# Patient Record
Sex: Female | Born: 1935 | Race: White | Hispanic: No | Smoking: Never smoker
Health system: Southern US, Community
[De-identification: ages and names within clinical notes are randomized; demographics above are authoritative.]

## PROBLEM LIST (undated history)

## (undated) DIAGNOSIS — M1990 Unspecified osteoarthritis, unspecified site: Secondary | ICD-10-CM

## (undated) DIAGNOSIS — I739 Peripheral vascular disease, unspecified: Secondary | ICD-10-CM

## (undated) DIAGNOSIS — M810 Age-related osteoporosis without current pathological fracture: Secondary | ICD-10-CM

## (undated) DIAGNOSIS — C801 Malignant (primary) neoplasm, unspecified: Secondary | ICD-10-CM

## (undated) DIAGNOSIS — I10 Essential (primary) hypertension: Secondary | ICD-10-CM

## (undated) HISTORY — PX: ABDOMINAL HYSTERECTOMY: SHX81

## (undated) HISTORY — PX: SPINE SURGERY: SHX786

## (undated) HISTORY — DX: Essential (primary) hypertension: I10

## (undated) HISTORY — DX: Age-related osteoporosis without current pathological fracture: M81.0

---

## 1982-05-12 HISTORY — PX: KNEE SURGERY: SHX244

## 2001-11-15 ENCOUNTER — Encounter: Admission: RE | Admit: 2001-11-15 | Discharge: 2001-11-15 | Payer: Self-pay | Admitting: Family Medicine

## 2001-11-15 ENCOUNTER — Encounter: Payer: Self-pay | Admitting: Family Medicine

## 2011-08-23 ENCOUNTER — Emergency Department (INDEPENDENT_AMBULATORY_CARE_PROVIDER_SITE_OTHER)
Admission: EM | Admit: 2011-08-23 | Discharge: 2011-08-23 | Disposition: A | Discharge status: Home / Self Care | Payer: Medicare Other | Source: Home / Self Care | Attending: Emergency Medicine | Admitting: Emergency Medicine

## 2011-08-23 ENCOUNTER — Encounter (HOSPITAL_COMMUNITY): Payer: Self-pay

## 2011-08-23 DIAGNOSIS — H1130 Conjunctival hemorrhage, unspecified eye: Secondary | ICD-10-CM | POA: Diagnosis not present

## 2011-08-23 NOTE — ED Notes (Signed)
Pt was cutting bushes today at 1pm and a branch of the bush hit her in her left eye.  Her eye is red and painful.

## 2011-08-23 NOTE — Discharge Instructions (Signed)
May use Systane, cool compresses or tea bag poultice if needed.  Subconjunctival Hemorrhage A subconjunctival hemorrhage is a bright red patch covering a portion of the white of the eye. The white part of the eye is called the sclera, and it is covered by a thin membrane called the conjunctiva. This membrane is clear, except for tiny blood vessels that you can see with the naked eye. When your eye is irritated or inflamed and becomes red, it is because the vessels in the conjunctiva are swollen. Sometimes, a blood vessel in the conjunctiva can break and bleed. When this occurs, the blood builds up between the conjunctiva and the sclera, and spreads out to create a red area. The red spot may be very small at first. It may then spread to cover a larger part of the surface of the eye, or even all of the visible white part of the eye. In almost all cases, the blood will go away and the eye will become white again. Before completely dissolving, however, the red area may spread. It may also become brownish-yellow in color, before going away. If a lot of blood collects under the conjunctiva, it may look like a bulge on the surface of the eye. This looks scary, but it will also eventually flatten out and go away. Subconjunctival hemorrhages do not cause pain, but if swollen, may cause a feeling of irritation. There is no effect on vision.  CAUSES   The most common cause is mild trauma (rubbing the eye, irritation).   Subconjunctival hemorrhages can happen because of coughing or straining (lifting heavy objects), vomiting, or sneezing.   In some cases, your doctor may want to check your blood pressure. High blood pressure can also cause a sunconjunctival hemorrhage.   Severe trauma or blunt injuries.   Diseases that affect blood clotting (hemophilia, leukemia).   Abnormalities of blood vessels behind the eye (carotid cavernous sinus fistula).   Tumors behind the eye.   Certain drugs (aspirin, coumadin,  heparin).   Recent eye surgery.  HOME CARE INSTRUCTIONS   Do not worry about the appearance of your eye. You may continue your usual activities.   Often, follow-up is not necessary.  SEEK MEDICAL CARE IF:   Your eye becomes painful.   The bleeding does not disappear within 3 weeks.   Bleeding occurs elsewhere, for example, under the skin, in the mouth, or in the other eye.   You have recurring subconjunctival hemorrhages.  SEEK IMMEDIATE MEDICAL CARE IF:   Your vision changes or you have difficulty seeing.   You develop severe headache, persistent vomiting, confusion, or abnormal drowsiness (lethargy).   Your eye seems to bulge or protrude from the eye socket.   You notice the sudden appearance of bruises, or have spontaneous bleeding elsewhere on your body.  Document Released: 04/28/2005 Document Revised: 04/17/2011 Document Reviewed: 03/26/2009 [facility] Patient Information 2012 [place], [place].

## 2011-08-23 NOTE — ED Provider Notes (Signed)
Chief Complaint  Patient presents with  . Eye Injury    History of Present Illness:   [name] is a 78 year old female who was trimming some hedges today when a stick flew up and struck her in the left eye. This caused some hemorrhaging in the conjunctiva. She denies any pain in the eye or difficulty with her vision. No diplopia. She denies any injury to the remainder of the face or eyelids.  Review of Systems:  Other than noted above, the patient denies any of the following symptoms: Systemic:  No fever, chills, sweats, fatigue, or weight loss. Eye:  No redness, eye pain, photophobia, discharge, blurred vision, or diplopia. ENT:  No nasal congestion, rhinorrhea, or sore throat. Lymphatic:  No adenopathy. Skin:  No rash or pruritis.  PMFSH:  Past medical history, family history, social history, meds, and allergies were reviewed.  Physical Exam:   Vital signs:  BP 172/90  Pulse 72  Temp(Src) 98.3 F (36.8 C) (Oral)  Resp 18  SpO2 98% General:  Alert and in no distress. Eye:  The eyelids and periorbital tissues are normal. There was no pain to palpation around the orbital rim. She has a some conjunctival hemorrhage on the lateral aspect of the left eye. The cornea is intact to fluorescein staining, anterior chamber is normal with no hyphema, funduscopic exam is normal. She has a full range of EOMs without diplopia. The right eye was normal. ENT:  TMs and canals clear.  Nasal mucosa normal.  No intra-oral lesions, mucous membranes moist, pharynx clear. Neck:  No adenopathy tenderness or mass. Skin:  Clear, warm and dry.  Assessment:   Diagnoses that have been ruled out:  None  Diagnoses that are still under consideration:  None  Final diagnoses:  Subconjunctival hematoma    Plan:   1.  The following meds were prescribed:   New Prescriptions   No medications on file   2.  The patient was instructed in symptomatic care and handouts were given. 3.  The patient was told to  return if becoming worse in any way, if no better in 3 or 4 days, and given some red flag symptoms that would indicate earlier return. I suggested cool compresses, Systane eyedrops, and avoidance of rubbing the eye. She will return if there any further problems.     Reuben Likes, MD 08/23/11 [phone]

## 2012-03-03 DIAGNOSIS — Z23 Encounter for immunization: Secondary | ICD-10-CM | POA: Diagnosis not present

## 2013-01-21 DIAGNOSIS — Z23 Encounter for immunization: Secondary | ICD-10-CM | POA: Diagnosis not present

## 2014-01-23 DIAGNOSIS — Z23 Encounter for immunization: Secondary | ICD-10-CM | POA: Diagnosis not present

## 2014-08-22 ENCOUNTER — Ambulatory Visit (INDEPENDENT_AMBULATORY_CARE_PROVIDER_SITE_OTHER): Payer: Medicare Other | Admitting: Family Medicine

## 2014-08-22 VITALS — BP 182/98 | HR 62 | Temp 97.9°F | Resp 18 | Ht 64.25 in | Wt 137.4 lb

## 2014-08-22 DIAGNOSIS — I499 Cardiac arrhythmia, unspecified: Secondary | ICD-10-CM

## 2014-08-22 DIAGNOSIS — I10 Essential (primary) hypertension: Secondary | ICD-10-CM

## 2014-08-22 DIAGNOSIS — M79672 Pain in left foot: Secondary | ICD-10-CM

## 2014-08-22 DIAGNOSIS — B351 Tinea unguium: Secondary | ICD-10-CM

## 2014-08-22 DIAGNOSIS — M79671 Pain in right foot: Secondary | ICD-10-CM

## 2014-08-22 LAB — POCT CBC
Granulocyte percent: 45 %G (ref 37–80)
HCT, POC: 43.6 % (ref 37.7–47.9)
Hemoglobin: 13.8 g/dL (ref 12.2–16.2)
Lymph, poc: 2.2 (ref 0.6–3.4)
MCH, POC: 28.8 pg (ref 27–31.2)
MCHC: 31.8 g/dL (ref 31.8–35.4)
MCV: 90.7 fL (ref 80–97)
MID (cbc): 0.4 (ref 0–0.9)
MPV: 8.1 fL (ref 0–99.8)
PLATELET COUNT, POC: 204 10*3/uL (ref 142–424)
POC Granulocyte: 2.1 (ref 2–6.9)
POC LYMPH PERCENT: 46.8 %L (ref 10–50)
POC MID %: 8.2 %M (ref 0–12)
RBC: 4.8 M/uL (ref 4.04–5.48)
RDW, POC: 15 %
WBC: 4.6 10*3/uL (ref 4.6–10.2)

## 2014-08-22 MED ORDER — PREDNISONE 20 MG PO TABS: ORAL_TABLET | ORAL | Status: DC

## 2014-08-22 MED ORDER — CEPHALEXIN 500 MG PO CAPS: 500.0000 mg | ORAL_CAPSULE | Freq: Three times a day (TID) | ORAL | Status: DC

## 2014-08-22 MED ORDER — AMLODIPINE BESYLATE 2.5 MG PO TABS: 2.5000 mg | ORAL_TABLET | Freq: Every day | ORAL | Status: DC

## 2014-08-22 NOTE — Progress Notes (Addendum)
[facility] [address], [place] [address] [phone]  Date:  08/22/2014   Name:  [name]   DOB:  08/01/1931   MRN:  [id number]  PCP:  No PCP Per Patient    Chief Complaint: L Foot / toe nail infection x 2 weeks   History of Present Illness:  [name] is a 81 y.o. very pleasant female patient who presents with the following:  She has noted some pain and possible infection around the left great toenail for a couple of weeks. This seems to be getting worse.  No known injury.she has noted that the toenail is thick and seems to have a fungal infection (also present on the right great toe).  She notes that she developed some redness of the right great toe and started doing hot epson salt water soaks about 2 weeks ago. The toes are still red, but she does not have any pain, burning or numbness.   She is generally in good health, she is not on any medication NKDA States she is not aware of any history of HTN, but admits that she does not really see doctors.  She does check her BP at home but does not know what her numbers look like at home.  However she does not think that her BP is generally high   She has no CP, no SOB, no palpitations and no history of syncope  There are no active problems to display for this patient.   History reviewed. No pertinent past medical history.  Past Surgical History  Procedure Laterality Date  . Spine surgery    . Knee surgery  1984    History  Substance Use Topics  . Smoking status: Never Smoker   . Smokeless tobacco: Not on file  . Alcohol Use: No    Family History  Problem Relation Age of Onset  . Diabetes Mother   . Heart disease Mother   . Dementia Sister   . Diabetes Son     No Known Allergies  Medication list has been reviewed and updated.  No current outpatient prescriptions on file prior to visit.   No current facility-administered medications on file prior to visit.    Review  of Systems:  As per HPI- otherwise negative.   Physical Examination: Filed Vitals:   08/22/14 1140  BP: 178/80  Pulse: 62  Temp: 97.9 F (36.6 C)  Resp: 18   Filed Vitals:   08/22/14 1140  Height: 5' 4.25" (1.632 m)  Weight: 137 lb 6.4 oz (62.324 kg)   Body mass index is 23.4 kg/(m^2). Ideal Body Weight: Weight in (lb) to have BMI = 25: 146.5  GEN: WDWN, NAD, Non-toxic, A & O x 3, looks well HEENT: Atraumatic, Normocephalic. Neck supple. No masses, No LAD. Ears and Nose: No external deformity. CV: mild irregularity ?pvcs, No M/G/R. No JVD. No thrill. No extra heart sounds. PULM: CTA B, no wheezes, crackles, rhonchi. No retractions. No resp. distress. No accessory muscle use. ABD: S, NT, ND, +BS. No rebound. No HSM. EXTR: No c/c/e NEURO Normal gait.  PSYCH: Normally interactive. Conversant. Not depressed or anxious appearing.  Calm demeanor.  left foot:  She has redness of all the toes but no heat tenderness. Normal cap refill.  The great toenail is thickened c/w onychomycosis but is not apparently ingrown. No sign of paronychia.  The skin on the dorsum of the foot is somewhat dry and scaly, and some of  the skin on her toes is dry and peeling  Results for orders placed or performed in visit on 08/22/14  POCT CBC  Result Value Ref Range   WBC 4.6 4.6 - 10.2 K/uL   Lymph, poc 2.2 0.6 - 3.4   POC LYMPH PERCENT 46.8 10 - 50 %L   MID (cbc) 0.4 0 - 0.9   POC MID % 8.2 0 - 12 %M   POC Granulocyte 2.1 2 - 6.9   Granulocyte percent 45.0 37 - 80 %G   RBC 4.80 4.04 - 5.48 M/uL   Hemoglobin 13.8 12.2 - 16.2 g/dL   HCT, POC 43.6 37.7 - 47.9 %   MCV 90.7 80 - 97 fL   MCH, POC 28.8 27 - 31.2 pg   MCHC 31.8 31.8 - 35.4 g/dL   RDW, POC 15.0 %   Platelet Count, POC 204 142 - 424 K/uL   MPV 8.1 0 - 99.8 fL    EKG: not a fib, likely SR wiht PACs vs Premature junctional complexes according to Dr. Marlou Porch who is DOD at [facility] cardiology  Assessment and Plan: Left foot pain - Plan:  cephALEXin (KEFLEX) 500 MG capsule, predniSONE (DELTASONE) 20 MG tablet  Onychomycosis - Plan: POCT CBC, Comprehensive metabolic panel  Essential hypertension - Plan: amLODipine (NORVASC) 2.5 MG tablet  Irregular cardiac rhythm - Plan: EKG 12-Lead   She is here today with a somewhat unusual foot problem.  She noted redness of all the toes of the left foot but no pain or discomfort.  I wonder if some of this redness may be due to recent use of frequent soaks.  Will treat her with a short course of steroids and also keflex and plan for close follow-up if not getting better HTN: she is agreeable to starting BP medication today.  Will start on a low dose of norvasc Discussed her EKG findings.  These are of uncertain significance.  Suggested that we refer her to cardiology for evaluation and to establish care in case of future concerns.  She declines for now but will keep this in mind   Meds ordered this encounter  Medications  . cephALEXin (KEFLEX) 500 MG capsule    Sig: Take 1 capsule (500 mg total) by mouth 3 (three) times daily.    Dispense:  21 capsule    Refill:  0  . predniSONE (DELTASONE) 20 MG tablet    Sig: Take 1 pill daily for 5 days    Dispense:  5 tablet    Refill:  0  . amLODipine (NORVASC) 2.5 MG tablet    Sig: Take 1 tablet (2.5 mg total) by mouth daily. Increase to 2 pills if instructed    Dispense:  60 tablet    Refill:  0      Signed Lamar Blinks, MD  Please call her DIL Cera Rorke [phone] with any information

## 2014-08-22 NOTE — Patient Instructions (Addendum)
I will be in touch with your labs asap For the time being stop soaking your foot, and use a gentle moisturizing cream as needed Use the keflex (antibitoic) and prednisone (steroid) as directed Assuming that your foot gets better and that your labs look ok we can start oral terbinafine for your toenail fungus.   Alternatively we can remove the nail if you continue to have discomfort after your redness is resolved   Let's start you on amlodipine 2.5 mg once a day for your blood pressure.  We may need to go up to 5 mg depending on your blood pressure

## 2014-08-23 LAB — COMPREHENSIVE METABOLIC PANEL
ALT: 12 U/L (ref 0–35)
AST: 16 U/L (ref 0–37)
Albumin: 4 g/dL (ref 3.5–5.2)
Alkaline Phosphatase: 72 U/L (ref 39–117)
BUN: 10 mg/dL (ref 6–23)
CALCIUM: 9.1 mg/dL (ref 8.4–10.5)
CO2: 24 mEq/L (ref 19–32)
CREATININE: 0.52 mg/dL (ref 0.50–1.10)
Chloride: 106 mEq/L (ref 96–112)
Glucose, Bld: 92 mg/dL (ref 70–99)
Potassium: 4.6 mEq/L (ref 3.5–5.3)
Sodium: 139 mEq/L (ref 135–145)
Total Bilirubin: 0.4 mg/dL (ref 0.2–1.2)
Total Protein: 6.4 g/dL (ref 6.0–8.3)

## 2014-08-27 ENCOUNTER — Telehealth: Payer: Self-pay | Admitting: *Deleted

## 2014-08-27 DIAGNOSIS — B351 Tinea unguium: Secondary | ICD-10-CM

## 2014-08-27 NOTE — Telephone Encounter (Signed)
Pt's daughter Elasia Furnish ) called and left message on lab voicemail in regards to pt's lab results. She stated someone called and spoke to her mother and due to her age she didn't understand. I tried to look into the chart to see who may have called or if a letter was sent, but was unable to find anything . It didn't even look like the labs were reviewed yet. Wasn't sure if maybe you tried calling her or not . Please advise and ill be happy to give them a call back .

## 2014-08-28 ENCOUNTER — Encounter: Payer: Self-pay | Admitting: Family Medicine

## 2014-08-28 MED ORDER — TERBINAFINE HCL 250 MG PO TABS: 250.0000 mg | ORAL_TABLET | Freq: Every day | ORAL | Status: DC

## 2014-08-28 NOTE — Telephone Encounter (Signed)
Called Debra back last night 4/17.  Her MIL's labs look good.  They report that her feet are looking a lot better.  She can use lamisil for her toenails if she likes, but I do not strongly recommend it as this treatment can have SE and the benefits are limited.  I will call in an rx for lamisil and they will talk about it at home.  Hilda Blades also reports that Valisha has had BP readings of approx 140/70s on the norvasc- advised that this is fine, she does not need to be any lower  Meds ordered this encounter  Medications  . terbinafine (LAMISIL) 250 MG tablet    Sig: Take 1 tablet (250 mg total) by mouth daily. Use for 12 weeks for toenail fungus    Dispense:  30 tablet    Refill:  2     Results for orders placed or performed in visit on 08/22/14  Comprehensive metabolic panel  Result Value Ref Range   Sodium 139 135 - 145 mEq/L   Potassium 4.6 3.5 - 5.3 mEq/L   Chloride 106 96 - 112 mEq/L   CO2 24 19 - 32 mEq/L   Glucose, Bld 92 70 - 99 mg/dL   BUN 10 6 - 23 mg/dL   Creat 0.52 0.50 - 1.10 mg/dL   Total Bilirubin 0.4 0.2 - 1.2 mg/dL   Alkaline Phosphatase 72 39 - 117 U/L   AST 16 0 - 37 U/L   ALT 12 0 - 35 U/L   Total Protein 6.4 6.0 - 8.3 g/dL   Albumin 4.0 3.5 - 5.2 g/dL   Calcium 9.1 8.4 - 10.5 mg/dL  POCT CBC  Result Value Ref Range   WBC 4.6 4.6 - 10.2 K/uL   Lymph, poc 2.2 0.6 - 3.4   POC LYMPH PERCENT 46.8 10 - 50 %L   MID (cbc) 0.4 0 - 0.9   POC MID % 8.2 0 - 12 %M   POC Granulocyte 2.1 2 - 6.9   Granulocyte percent 45.0 37 - 80 %G   RBC 4.80 4.04 - 5.48 M/uL   Hemoglobin 13.8 12.2 - 16.2 g/dL   HCT, POC 43.6 37.7 - 47.9 %   MCV 90.7 80 - 97 fL   MCH, POC 28.8 27 - 31.2 pg   MCHC 31.8 31.8 - 35.4 g/dL   RDW, POC 15.0 %   Platelet Count, POC 204 142 - 424 K/uL   MPV 8.1 0 - 99.8 fL

## 2014-09-08 ENCOUNTER — Telehealth: Payer: Self-pay | Admitting: Family Medicine

## 2014-09-08 NOTE — Telephone Encounter (Signed)
Called but no answer- LMOM.  Will try back

## 2014-09-08 NOTE — Telephone Encounter (Signed)
Patient's daughter in law states that patient's big toe is still in pain. Please help.  Lorissa Kishbaugh (DIL) [phone]

## 2014-09-08 NOTE — Telephone Encounter (Signed)
Patient's daughter Jackelyn Poling is returning a missed phone call for Dr. Lorelei Pont. Please call!

## 2014-09-08 NOTE — Telephone Encounter (Signed)
fyi dr Lorelei Pont

## 2014-09-09 NOTE — Telephone Encounter (Signed)
Called again and [facility].  I will try again, but if she is still having foot pain it may be helpful to bring her back into clinic.  The cause for her pain was not clear at her last visit so a re-eval may be needed

## 2014-09-10 NOTE — Telephone Encounter (Signed)
Called [name] back- they plan to bring her in tomorrow for a recheck.  This is a good plan and I will look for her then

## 2014-09-11 ENCOUNTER — Ambulatory Visit (INDEPENDENT_AMBULATORY_CARE_PROVIDER_SITE_OTHER): Payer: Medicare Other | Admitting: Family Medicine

## 2014-09-11 VITALS — BP 126/76 | HR 82 | Temp 98.2°F | Resp 16 | Ht 64.5 in | Wt 140.0 lb

## 2014-09-11 DIAGNOSIS — I10 Essential (primary) hypertension: Secondary | ICD-10-CM | POA: Diagnosis not present

## 2014-09-11 DIAGNOSIS — M79662 Pain in left lower leg: Secondary | ICD-10-CM | POA: Diagnosis not present

## 2014-09-11 MED ORDER — AMLODIPINE BESYLATE 2.5 MG PO TABS: 2.5000 mg | ORAL_TABLET | Freq: Every day | ORAL | Status: DC

## 2014-09-11 NOTE — Progress Notes (Signed)
[facility] [address] [place] [address] [phone]  Date:  09/11/2014   Name:  [name]   DOB:  09-Oct-1931   MRN:  [id number]  PCP:  No PCP Per Patient    Chief Complaint: Follow-up   History of Present Illness:  [name] is a 81 y.o. very pleasant female patient who presents with the following:  Here to follow-up foot pain.  I saw her on 4/12 with pain in her left foot without any apparent cause.  We tried a short course of prednisone and keflex.  She did get better for a time but then her pain and redness came back.  She notes the sx just in her left foot.  She has stopped soaking her foot.  She has a couple of skin cracks now over the medial 1st MCP and over the medial great toe.    She is a never smoker and is not diabetic.   No prior history of circulation problems She is taking just 2.5 mg of amlodipine and her BP is doing well  Patient Active Problem List   Diagnosis Date Noted  . Essential hypertension 08/22/2014    History reviewed. No pertinent past medical history.  Past Surgical History  Procedure Laterality Date  . Spine surgery    . Knee surgery  1984    History  Substance Use Topics  . Smoking status: Never Smoker   . Smokeless tobacco: Not on file  . Alcohol Use: No    Family History  Problem Relation Age of Onset  . Diabetes Mother   . Heart disease Mother   . Dementia Sister   . Diabetes Son     No Known Allergies  Medication list has been reviewed and updated.  Current Outpatient Prescriptions on File Prior to Visit  Medication Sig Dispense Refill  . amLODipine (NORVASC) 2.5 MG tablet Take 1 tablet (2.5 mg total) by mouth daily. Increase to 2 pills if instructed 60 tablet 0  . cephALEXin (KEFLEX) 500 MG capsule Take 1 capsule (500 mg total) by mouth 3 (three) times daily. 21 capsule 0  . terbinafine (LAMISIL) 250 MG tablet Take 1 tablet (250 mg total) by mouth daily. Use for 12 weeks for  toenail fungus 30 tablet 2  . predniSONE (DELTASONE) 20 MG tablet Take 1 pill daily for 5 days (Patient not taking: Reported on 09/11/2014) 5 tablet 0   No current facility-administered medications on file prior to visit.    Review of Systems:  As per HPI- otherwise negative.   Physical Examination: Filed Vitals:   09/11/14 1400  BP: 126/76  Pulse: 82  Temp: 98.2 F (36.8 C)  Resp: 16   Filed Vitals:   09/11/14 1400  Height: 5' 4.5" (1.638 m)  Weight: 140 lb (63.504 kg)   Body mass index is 23.67 kg/(m^2). Ideal Body Weight: Weight in (lb) to have BMI = 25: 147.6  GEN: WDWN, NAD, Non-toxic, A & O x 3, well appearing older lady, here today with her DIL Debra HEENT: Atraumatic, Normocephalic. Neck supple. No masses, No LAD. Ears and Nose: No external deformity. CV: RRR, No M/G/R. No JVD. No thrill. No extra heart sounds. PULM: CTA B, no wheezes, crackles, rhonchi. No retractions. No resp. distress. No accessory muscle use.Marland Kitchen EXTR: No c/c/e NEURO Normal gait.  PSYCH: Normally interactive. Conversant. Not depressed or anxious appearing.  Calm demeanor.   Right foot is normal, normal color and perfusion of  toes and no redness.   The left toes are still ruddy in appearance and now with scattered tiny petechiae over the dorsal toes.  She has reduced but present dorsalis pedis pulse, normal cap refill of the toes although her toes are slightly cool in comparison with the right. She has 2 non- infected appearing skin cracks on the medial left great toe/ 1st MCP.  These were dressed with non- stick pads and cobain dressing  Assessment and Plan: Pain of left lower leg - Plan: Ambulatory referral to Vascular Surgery  Essential hypertension  BP looks great on her 2.5 mg of amlodipine.  Unusual foot problem.  Suspect her issue may be circulatory.  Was able to make appt with vascular surgery this week- appreciate their consultation.  Signed Lamar Blinks, MD

## 2014-09-11 NOTE — Patient Instructions (Addendum)
You have an appt with vascular surgery this Wednesday 09/13/14 at 2:30 pm. Arrive around 2:00  [facility] [place] ?   Address: [address] [place], [place] [address]  [phone]   Your blood pressure looks much better!

## 2014-09-12 ENCOUNTER — Other Ambulatory Visit: Payer: Self-pay | Admitting: *Deleted

## 2014-09-12 ENCOUNTER — Encounter: Payer: Self-pay | Admitting: Vascular Surgery

## 2014-09-12 DIAGNOSIS — R0989 Other specified symptoms and signs involving the circulatory and respiratory systems: Secondary | ICD-10-CM

## 2014-09-13 ENCOUNTER — Ambulatory Visit (INDEPENDENT_AMBULATORY_CARE_PROVIDER_SITE_OTHER): Payer: Medicare Other | Admitting: Vascular Surgery

## 2014-09-13 ENCOUNTER — Encounter: Payer: Self-pay | Admitting: Vascular Surgery

## 2014-09-13 ENCOUNTER — Ambulatory Visit (HOSPITAL_COMMUNITY)
Admission: RE | Admit: 2014-09-13 | Discharge: 2014-09-13 | Disposition: A | Discharge status: Home / Self Care | Payer: Medicare Other | Source: Ambulatory Visit | Attending: Vascular Surgery | Admitting: Vascular Surgery

## 2014-09-13 VITALS — BP 145/74 | HR 68 | Resp 16 | Ht 64.0 in | Wt 139.0 lb

## 2014-09-13 DIAGNOSIS — I739 Peripheral vascular disease, unspecified: Secondary | ICD-10-CM | POA: Diagnosis not present

## 2014-09-13 DIAGNOSIS — R0989 Other specified symptoms and signs involving the circulatory and respiratory systems: Secondary | ICD-10-CM | POA: Diagnosis not present

## 2014-09-13 NOTE — Progress Notes (Signed)
[facility] [place] [facility] HISTORY AND PHYSICAL   History of Present Illness:  Patient is a 81 y.o. year old female who presents for evaluation of painful left first toe. The patient has a one-month history of what was thought to be an ingrown toenail on the left first toe. This has failed to heal. She was on Keflex for 2 weeks. This did not improve. She states the swelling has improved somewhat but she now has a crack in the skin. She also states the tip of the toe is numb. There is some pain a few put pressure on the toe. She has had no drainage from the toe. She denies history of diabetes. She does have a history of hypertension. She states her cholesterol has been okay.   Past Medical History  Diagnosis Date  . Hypertension     Past Surgical History  Procedure Laterality Date  . Spine surgery    . Knee surgery  1984    Social History History  Substance Use Topics  . Smoking status: Never Smoker   . Smokeless tobacco: Never Used  . Alcohol Use: No    Family History Family History  Problem Relation Age of Onset  . Diabetes Mother   . Heart disease Mother   . Dementia Sister   . Diabetes Son     Allergies  No Known Allergies   Current Outpatient Prescriptions  Medication Sig Dispense Refill  . amLODipine (NORVASC) 2.5 MG tablet Take 1 tablet (2.5 mg total) by mouth daily. 90 tablet 1  . naproxen sodium (ANAPROX) 220 MG tablet Take 220 mg by mouth as needed.    . terbinafine (LAMISIL) 250 MG tablet Take 1 tablet (250 mg total) by mouth daily. Use for 12 weeks for toenail fungus 30 tablet 2  . cephALEXin (KEFLEX) 500 MG capsule Take 1 capsule (500 mg total) by mouth 3 (three) times daily. (Patient not taking: Reported on 09/13/2014) 21 capsule 0  . predniSONE (DELTASONE) 20 MG tablet Take 1 pill daily for 5 days (Patient not taking: Reported on 09/11/2014) 5 tablet 0   No current facility-administered medications for this visit.    ROS:   General:  No  weight loss, Fever, chills  HEENT: No recent headaches, no nasal bleeding, no visual changes, no sore throat  Neurologic: No dizziness, blackouts, seizures. No recent symptoms of stroke or mini- stroke. No recent episodes of slurred speech, or temporary blindness.  Cardiac: No recent episodes of chest pain/pressure, no shortness of breath at rest.  + shortness of breath with exertion.  Denies history of atrial fibrillation states her doctor has noticed an occasional skipped beat.  Vascular: No history of rest pain in feet.  No history of claudication.  No history of non-healing ulcer, No history of DVT   Pulmonary: No home oxygen, no productive cough, no hemoptysis,  No asthma or wheezing  Musculoskeletal:  [ ]  Arthritis, [ ]  Low back pain,  [ ]  Joint pain  Hematologic:No history of hypercoagulable state.  No history of easy bleeding.  No history of anemia  Gastrointestinal: No hematochezia or melena,  No gastroesophageal reflux, no trouble swallowing  Urinary: [ ]  chronic Kidney disease, [ ]  on HD - [ ]  MWF or [ ]  TTHS, [ ]  Burning with urination, [ ]  Frequent urination, [ ]  Difficulty urinating;   Skin: No rashes  Psychological: No history of anxiety,  No history of depression   Physical Examination  Filed Vitals:   09/13/14  1511 09/13/14 1514  BP: 178/80 145/74  Pulse: 66 68  Resp: 16   Height: 5\' 4"  (1.626 m)   Weight: 139 lb (63.05 kg)     Body mass index is 23.85 kg/(m^2).  General:  Alert and oriented, no acute distress HEENT: Normal Neck: No bruit or JVD Pulmonary: Clear to auscultation bilaterally Cardiac: Regular Rate and Rhythm without murmur Abdomen: Soft, non-tender, non-distended, no mass, thin, aortic pulsation palpable Skin: No rash, erythema left first toe extending the metatarsal head no open wounds slight crack and skin with no drainage crack is over the first metatarsal head Extremity Pulses:  2+ radial, brachial, femoral, 3+ popliteal bilaterally  absent dorsalis pedis, posterior tibial pulses bilaterally Musculoskeletal: No deformity trace pretibial and pedal edema  Neurologic: Upper and lower extremity motor 5/5 and symmetric  DATA:  Patient had bilateral ABIs performed in our office today. I reviewed and interpreted this study. ABI on the right was 0.6 left was also 0.6   ASSESSMENT:  Nonhealing wound left first toe. Patient has ABIs consistent with bilateral peripheral arterial disease. She has excellent popliteal pulses and I am suspicious she may have tibial artery occlusive disease.   PLAN:  I believe the best option would be arteriogram plus minus intervention if she has significant occlusive disease in the left lower extremity that needs treatment. Risks benefits possible competitions and procedure details including but not limited to bleeding infection vessel injury contrast reaction were explained to the patient and her daughter today. She understands and agrees to proceed. Arteriogram is scheduled for 09/15/2014.  [name], MD [facility] [place] Office: [phone] Pager: [phone]

## 2014-09-13 NOTE — Progress Notes (Signed)
Filed Vitals:   09/13/14 1511 09/13/14 1514  BP: 178/80 145/74  Pulse: 66 68  Resp: 16   Height: 5\' 4"  (1.626 m)   Weight: 139 lb (63.05 kg)    Body mass index is 23.85 kg/(m^2).

## 2014-09-14 ENCOUNTER — Other Ambulatory Visit: Payer: Self-pay

## 2014-09-15 ENCOUNTER — Ambulatory Visit (HOSPITAL_COMMUNITY)
Admission: RE | Admit: 2014-09-15 | Discharge: 2014-09-15 | Disposition: A | Discharge status: Home / Self Care | Payer: Medicare Other | Source: Ambulatory Visit | Attending: Vascular Surgery | Admitting: Vascular Surgery

## 2014-09-15 ENCOUNTER — Telehealth: Payer: Self-pay | Admitting: Vascular Surgery

## 2014-09-15 ENCOUNTER — Telehealth: Payer: Self-pay

## 2014-09-15 ENCOUNTER — Other Ambulatory Visit: Payer: Self-pay | Admitting: *Deleted

## 2014-09-15 ENCOUNTER — Encounter (HOSPITAL_COMMUNITY)
Admission: RE | Disposition: A | Discharge status: Home / Self Care | Payer: Medicare Other | Source: Ambulatory Visit | Attending: Vascular Surgery

## 2014-09-15 ENCOUNTER — Encounter (HOSPITAL_COMMUNITY): Payer: Self-pay | Admitting: Vascular Surgery

## 2014-09-15 DIAGNOSIS — I10 Essential (primary) hypertension: Secondary | ICD-10-CM | POA: Diagnosis not present

## 2014-09-15 DIAGNOSIS — I70209 Unspecified atherosclerosis of native arteries of extremities, unspecified extremity: Secondary | ICD-10-CM

## 2014-09-15 DIAGNOSIS — L98499 Non-pressure chronic ulcer of skin of other sites with unspecified severity: Principal | ICD-10-CM

## 2014-09-15 DIAGNOSIS — M25579 Pain in unspecified ankle and joints of unspecified foot: Secondary | ICD-10-CM

## 2014-09-15 DIAGNOSIS — I70203 Unspecified atherosclerosis of native arteries of extremities, bilateral legs: Secondary | ICD-10-CM | POA: Insufficient documentation

## 2014-09-15 DIAGNOSIS — I70235 Atherosclerosis of native arteries of right leg with ulceration of other part of foot: Secondary | ICD-10-CM | POA: Diagnosis not present

## 2014-09-15 HISTORY — PX: PERIPHERAL VASCULAR CATHETERIZATION: SHX172C

## 2014-09-15 LAB — POCT I-STAT, CHEM 8
BUN: 10 mg/dL (ref 6–20)
Calcium, Ion: 1.18 mmol/L (ref 1.13–1.30)
Chloride: 106 mmol/L (ref 101–111)
Creatinine, Ser: 0.6 mg/dL (ref 0.44–1.00)
GLUCOSE: 86 mg/dL (ref 70–99)
HCT: 41 % (ref 36.0–46.0)
Hemoglobin: 13.9 g/dL (ref 12.0–15.0)
POTASSIUM: 3.9 mmol/L (ref 3.5–5.1)
SODIUM: 141 mmol/L (ref 135–145)
TCO2: 20 mmol/L (ref 0–100)

## 2014-09-15 SURGERY — ABDOMINAL AORTOGRAM
Anesthesia: LOCAL

## 2014-09-15 MED ORDER — SODIUM CHLORIDE 0.45 % IV SOLN
INTRAVENOUS | Status: DC
  Administered 2014-09-15: 12:00:00 via INTRAVENOUS

## 2014-09-15 MED ORDER — SODIUM CHLORIDE 0.9 % IV SOLN
INTRAVENOUS | Status: DC
  Administered 2014-09-15: 10:00:00 via INTRAVENOUS

## 2014-09-15 MED ORDER — ONDANSETRON HCL 4 MG/2ML IJ SOLN: 4.0000 mg | Freq: Four times a day (QID) | INTRAMUSCULAR | Status: DC | PRN

## 2014-09-15 MED ORDER — HYDRALAZINE HCL 20 MG/ML IJ SOLN: 5.0000 mg | INTRAMUSCULAR | Status: DC | PRN

## 2014-09-15 MED ORDER — MORPHINE SULFATE 10 MG/ML IJ SOLN: 2.0000 mg | INTRAMUSCULAR | Status: DC | PRN

## 2014-09-15 MED ORDER — ACETAMINOPHEN 325 MG RE SUPP
325.0000 mg | RECTAL | Status: DC | PRN
Start: 2014-09-15 — End: 2014-09-15
  Filled 2014-09-15: qty 2

## 2014-09-15 MED ORDER — LABETALOL HCL 5 MG/ML IV SOLN: 10.0000 mg | INTRAVENOUS | Status: DC | PRN

## 2014-09-15 MED ORDER — ACETAMINOPHEN 325 MG PO TABS
325.0000 mg | ORAL_TABLET | ORAL | Status: DC | PRN
Start: 2014-09-15 — End: 2014-09-15
  Filled 2014-09-15: qty 2

## 2014-09-15 MED ORDER — IODIXANOL 320 MG/ML IV SOLN
INTRAVENOUS | Status: DC | PRN
  Administered 2014-09-15: 210 mL via INTRA_ARTERIAL

## 2014-09-15 MED ORDER — OXYCODONE HCL 5 MG PO TABS: 5.0000 mg | ORAL_TABLET | ORAL | Status: DC | PRN

## 2014-09-15 MED ORDER — METOPROLOL TARTRATE 1 MG/ML IV SOLN: 2.0000 mg | INTRAVENOUS | Status: DC | PRN

## 2014-09-15 SURGICAL SUPPLY — 8 items
CATH CROSS OVER TEMPO 5F (CATHETERS) ×1 IMPLANT
CATH STRAIGHT 5FR 65CM (CATHETERS) ×1 IMPLANT
KIT PV (KITS) ×3 IMPLANT
SHEATH PINNACLE 6F 10CM (SHEATH) ×1 IMPLANT
SYR MEDRAD MARK V 150ML (SYRINGE) ×1 IMPLANT
TRANSDUCER W/STOPCOCK (MISCELLANEOUS) ×3 IMPLANT
TRAY PV CATH (CUSTOM PROCEDURE TRAY) ×3 IMPLANT
WIRE HITORQ VERSACORE ST 145CM (WIRE) ×1 IMPLANT

## 2014-09-15 NOTE — Telephone Encounter (Signed)
Jackelyn Poling is calling on behalf of patient and would like to speak with Dr. Lorelei Pont. 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 Please call Jackelyn Poling! [phone]

## 2014-09-15 NOTE — Progress Notes (Signed)
Report received from [facility]. Patient resting in bed comfortably. 71F sheath intact with no active bleeding or hematoma noted.

## 2014-09-15 NOTE — H&P (View-Only) (Signed)
[facility] [place] HISTORY AND PHYSICAL   History of Present Illness:  Patient is a 81 y.o. year old female who presents for evaluation of painful left first toe. The patient has a one-month history of what was thought to be an ingrown toenail on the left first toe. This has failed to heal. She was on Keflex for 2 weeks. This did not improve. She states the swelling has improved somewhat but she now has a crack in the skin. She also states the tip of the toe is numb. There is some pain a few put pressure on the toe. She has had no drainage from the toe. She denies history of diabetes. She does have a history of hypertension. She states her cholesterol has been okay.   Past Medical History  Diagnosis Date  . Hypertension     Past Surgical History  Procedure Laterality Date  . Spine surgery    . Knee surgery  1984    Social History History  Substance Use Topics  . Smoking status: Never Smoker   . Smokeless tobacco: Never Used  . Alcohol Use: No    Family History Family History  Problem Relation Age of Onset  . Diabetes Mother   . Heart disease Mother   . Dementia Sister   . Diabetes Son     Allergies  No Known Allergies   Current Outpatient Prescriptions  Medication Sig Dispense Refill  . amLODipine (NORVASC) 2.5 MG tablet Take 1 tablet (2.5 mg total) by mouth daily. 90 tablet 1  . naproxen sodium (ANAPROX) 220 MG tablet Take 220 mg by mouth as needed.    . terbinafine (LAMISIL) 250 MG tablet Take 1 tablet (250 mg total) by mouth daily. Use for 12 weeks for toenail fungus 30 tablet 2  . cephALEXin (KEFLEX) 500 MG capsule Take 1 capsule (500 mg total) by mouth 3 (three) times daily. (Patient not taking: Reported on 09/13/2014) 21 capsule 0  . predniSONE (DELTASONE) 20 MG tablet Take 1 pill daily for 5 days (Patient not taking: Reported on 09/11/2014) 5 tablet 0   No current facility-administered medications for this visit.    ROS:   General:  No  weight loss, Fever, chills  HEENT: No recent headaches, no nasal bleeding, no visual changes, no sore throat  Neurologic: No dizziness, blackouts, seizures. No recent symptoms of stroke or mini- stroke. No recent episodes of slurred speech, or temporary blindness.  Cardiac: No recent episodes of chest pain/pressure, no shortness of breath at rest.  + shortness of breath with exertion.  Denies history of atrial fibrillation states her doctor has noticed an occasional skipped beat.  Vascular: No history of rest pain in feet.  No history of claudication.  No history of non-healing ulcer, No history of DVT   Pulmonary: No home oxygen, no productive cough, no hemoptysis,  No asthma or wheezing  Musculoskeletal:  [ ]  Arthritis, [ ]  Low back pain,  [ ]  Joint pain  Hematologic:No history of hypercoagulable state.  No history of easy bleeding.  No history of anemia  Gastrointestinal: No hematochezia or melena,  No gastroesophageal reflux, no trouble swallowing  Urinary: [ ]  chronic Kidney disease, [ ]  on HD - [ ]  MWF or [ ]  TTHS, [ ]  Burning with urination, [ ]  Frequent urination, [ ]  Difficulty urinating;   Skin: No rashes  Psychological: No history of anxiety,  No history of depression   Physical Examination  Filed Vitals:   09/13/14  1511 09/13/14 1514  BP: 178/80 145/74  Pulse: 66 68  Resp: 16   Height: 5\' 4"  (1.626 m)   Weight: 139 lb (63.05 kg)     Body mass index is 23.85 kg/(m^2).  General:  Alert and oriented, no acute distress HEENT: Normal Neck: No bruit or JVD Pulmonary: Clear to auscultation bilaterally Cardiac: Regular Rate and Rhythm without murmur Abdomen: Soft, non-tender, non-distended, no mass, thin, aortic pulsation palpable Skin: No rash, erythema left first toe extending the metatarsal head no open wounds slight crack and skin with no drainage crack is over the first metatarsal head Extremity Pulses:  2+ radial, brachial, femoral, 3+ popliteal bilaterally  absent dorsalis pedis, posterior tibial pulses bilaterally Musculoskeletal: No deformity trace pretibial and pedal edema  Neurologic: Upper and lower extremity motor 5/5 and symmetric  DATA:  Patient had bilateral ABIs performed in our office today. I reviewed and interpreted this study. ABI on the right was 0.6 left was also 0.6   ASSESSMENT:  Nonhealing wound left first toe. Patient has ABIs consistent with bilateral peripheral arterial disease. She has excellent popliteal pulses and I am suspicious she may have tibial artery occlusive disease.   PLAN:  I believe the best option would be arteriogram plus minus intervention if she has significant occlusive disease in the left lower extremity that needs treatment. Risks benefits possible competitions and procedure details including but not limited to bleeding infection vessel injury contrast reaction were explained to the patient and her daughter today. She understands and agrees to proceed. Arteriogram is scheduled for 09/15/2014.  [name], MD [facility] [place] Office: [phone] Pager: [phone]

## 2014-09-15 NOTE — Telephone Encounter (Signed)
lmom to cb. 

## 2014-09-15 NOTE — Progress Notes (Signed)
Report called to [place] by [facility]. Rt groin site remains soft with no hematoma or bleeding noted. Patient states no pain. Patient to be transferred to Short Stay Room 4 at this time.

## 2014-09-15 NOTE — Interval H&P Note (Signed)
History and Physical Interval Note:  09/15/2014 9:35 AM  [name]  has presented today for surgery, with the diagnosis of pvd with non healing wound first left toe  The various methods of treatment have been discussed with the patient and family. After consideration of risks, benefits and other options for treatment, the patient has consented to  Procedure(s): Abdominal Aortogram (N/A) as a surgical intervention .  The patient's history has been reviewed, patient examined, no change in status, stable for surgery.  I have reviewed the patient's chart and labs.  Questions were answered to the patient's satisfaction.     [name]

## 2014-09-15 NOTE — Discharge Instructions (Signed)

## 2014-09-15 NOTE — Progress Notes (Signed)
21F Sheath removed to Rt Femoral Artery. Manual Pressure held for 20 minutes. Hemostasis achieved. 4x4 and tegaderm applied to Rt Groin. VSS. Post procedure bleeding precautions reviewed with patient. Rt DP and Lt DP pulses noted on doppler. Patient states no pain at this time. Patient resting comfortably. Will continue to monitor patient.

## 2014-09-15 NOTE — Telephone Encounter (Addendum)
-----   Message from Mena Goes, RN sent at 09/15/2014 12:24 PM EDT ----- Regarding: Schedule   ----- Message -----    From: Elam Dutch, MD    Sent: 09/15/2014  11:11 AM      To: Vvs Charge Pool  [place] groin Aortogram with bilat runoff 3rd order left SFA cath  Pt has unreconstructable disease.  Please refer to wound center for left foot wound.  She does not need follow up with me  I also assisted Dr Trula Slade with his aortobifem today  [name]  09/15/14: sent info to [facility] via fax

## 2014-09-18 MED ORDER — ACETAMINOPHEN-CODEINE #3 300-30 MG PO TABS
ORAL_TABLET | ORAL | Status: DC
Start: 2014-09-18 — End: 2014-10-05

## 2014-09-18 NOTE — Telephone Encounter (Signed)
Called [name] back- will rx tylenol with codeine.  Cautioned regarding sedation.   Her vascular disease in her feet is non- operable.  They will try to get her wound healed if at all possible Can stop terbinafine as we know the cause of her pain

## 2014-09-18 NOTE — Telephone Encounter (Signed)
Spoke with [name], Dr Caryn Section stated her veins in her foot were obiliterated. She has an appt at the wound care center on Friday. Debbie wants to know if we could prescribe her a little pain medicine to help her because she is in so much pain. Please advise.

## 2014-09-22 ENCOUNTER — Encounter (HOSPITAL_BASED_OUTPATIENT_CLINIC_OR_DEPARTMENT_OTHER): Payer: Medicare Other | Attending: Internal Medicine

## 2014-09-22 DIAGNOSIS — I739 Peripheral vascular disease, unspecified: Secondary | ICD-10-CM | POA: Insufficient documentation

## 2014-09-22 DIAGNOSIS — L97521 Non-pressure chronic ulcer of other part of left foot limited to breakdown of skin: Secondary | ICD-10-CM | POA: Insufficient documentation

## 2014-09-22 DIAGNOSIS — I10 Essential (primary) hypertension: Secondary | ICD-10-CM | POA: Diagnosis not present

## 2014-09-22 DIAGNOSIS — L930 Discoid lupus erythematosus: Secondary | ICD-10-CM | POA: Insufficient documentation

## 2014-09-29 DIAGNOSIS — L930 Discoid lupus erythematosus: Secondary | ICD-10-CM | POA: Diagnosis not present

## 2014-09-29 DIAGNOSIS — I739 Peripheral vascular disease, unspecified: Secondary | ICD-10-CM | POA: Diagnosis not present

## 2014-09-29 DIAGNOSIS — I10 Essential (primary) hypertension: Secondary | ICD-10-CM | POA: Diagnosis not present

## 2014-09-29 DIAGNOSIS — L97521 Non-pressure chronic ulcer of other part of left foot limited to breakdown of skin: Secondary | ICD-10-CM | POA: Diagnosis not present

## 2014-10-05 ENCOUNTER — Other Ambulatory Visit: Payer: Self-pay | Admitting: Family Medicine

## 2014-10-05 DIAGNOSIS — I739 Peripheral vascular disease, unspecified: Secondary | ICD-10-CM

## 2014-10-05 DIAGNOSIS — I10 Essential (primary) hypertension: Secondary | ICD-10-CM

## 2014-10-06 DIAGNOSIS — L930 Discoid lupus erythematosus: Secondary | ICD-10-CM | POA: Diagnosis not present

## 2014-10-06 DIAGNOSIS — I10 Essential (primary) hypertension: Secondary | ICD-10-CM | POA: Diagnosis not present

## 2014-10-06 DIAGNOSIS — I739 Peripheral vascular disease, unspecified: Secondary | ICD-10-CM | POA: Diagnosis not present

## 2014-10-06 DIAGNOSIS — L97521 Non-pressure chronic ulcer of other part of left foot limited to breakdown of skin: Secondary | ICD-10-CM | POA: Diagnosis not present

## 2014-10-11 ENCOUNTER — Telehealth: Payer: Self-pay

## 2014-10-11 NOTE — Telephone Encounter (Signed)
[name] is calling to speak with Dr. Lorelei Pont. She states that the patient's legs a feet are swollen. Please call!  [phone]

## 2014-10-12 NOTE — Telephone Encounter (Signed)
Spoke with pt, she states her legs are better than they were yesterday, she stated it was a side effect to her medication. I advised pt to come in if not better. Pt understood.

## 2014-10-13 ENCOUNTER — Encounter (HOSPITAL_BASED_OUTPATIENT_CLINIC_OR_DEPARTMENT_OTHER): Payer: Medicare Other | Attending: Internal Medicine

## 2014-10-13 DIAGNOSIS — B351 Tinea unguium: Secondary | ICD-10-CM | POA: Diagnosis not present

## 2014-10-13 DIAGNOSIS — I739 Peripheral vascular disease, unspecified: Secondary | ICD-10-CM | POA: Diagnosis not present

## 2014-10-13 DIAGNOSIS — L97521 Non-pressure chronic ulcer of other part of left foot limited to breakdown of skin: Secondary | ICD-10-CM | POA: Diagnosis not present

## 2014-10-16 ENCOUNTER — Ambulatory Visit (INDEPENDENT_AMBULATORY_CARE_PROVIDER_SITE_OTHER): Payer: Medicare Other | Admitting: Family Medicine

## 2014-10-16 VITALS — BP 124/70 | HR 65 | Temp 98.3°F | Resp 17 | Ht 65.5 in | Wt 140.8 lb

## 2014-10-16 DIAGNOSIS — I739 Peripheral vascular disease, unspecified: Secondary | ICD-10-CM

## 2014-10-16 DIAGNOSIS — M7989 Other specified soft tissue disorders: Secondary | ICD-10-CM

## 2014-10-16 DIAGNOSIS — L98499 Non-pressure chronic ulcer of skin of other sites with unspecified severity: Secondary | ICD-10-CM | POA: Diagnosis not present

## 2014-10-16 DIAGNOSIS — L97521 Non-pressure chronic ulcer of other part of left foot limited to breakdown of skin: Secondary | ICD-10-CM

## 2014-10-16 MED ORDER — LIDOCAINE 4 % EX CREA: 1.0000 "application " | TOPICAL_CREAM | CUTANEOUS | Status: DC | PRN

## 2014-10-16 NOTE — Progress Notes (Signed)
[facility] [address] [place] [address] [phone]  Date:  10/16/2014   Name:  [name]   DOB:  10-22-1931   MRN:  [id number]  PCP:  Lamar Blinks, MD    Chief Complaint: Hypertension; Leg Swelling; and other   History of Present Illness:  [name] is a 81 y.o. very pleasant female patient who presents with the following:  Here today for a recheck.  Earlier this spring ound to have mild HTN which has responded well to a low dose of norvasc, as well as pain in her left foot/ toes which turned out to be due to peripheral arterial disease.  This is not treatable surgically- she is being seen weekly at wound care to try and get a wound on her right left toe to heal She is going to the wound center for treatment of the non- healing wound of her left foot.  Her abx were changed to bactrim 3 days ago.   She noted onset of swelling in her lower legs about one week ago.  She has been on the norvasc for about 2 months and the swelling just started so we do not think this is the cause.  It is worse on the left than on the right.  However she does feel it has improved since switching to bactrim recently.   No SOB, no history of DVT or PE  Patient Active Problem List   Diagnosis Date Noted  . Essential hypertension 08/22/2014    Past Medical History  Diagnosis Date  . Hypertension     Past Surgical History  Procedure Laterality Date  . Spine surgery    . Knee surgery  1984  . Peripheral vascular catheterization N/A 09/15/2014    Procedure: Abdominal Aortogram;  Surgeon: Elam Dutch, MD;  Location: [facility] INVASIVE CV LAB CUPID;  Service: Cardiovascular;  Laterality: N/A;  . Peripheral vascular catheterization Bilateral 09/15/2014    Procedure: Lower Extremity Angiography;  Surgeon: Elam Dutch, MD;  Location: [facility] INVASIVE CV LAB CUPID;  Service: Cardiovascular;  Laterality: Bilateral;    History  Substance Use Topics  . Smoking  status: Never Smoker   . Smokeless tobacco: Never Used  . Alcohol Use: No    Family History  Problem Relation Age of Onset  . Diabetes Mother   . Heart disease Mother   . Dementia Sister   . Diabetes Son     Allergies  Allergen Reactions  . Doxapap-N [Propoxyphene] Nausea And Vomiting    Medication list has been reviewed and updated.  Current Outpatient Prescriptions on File Prior to Visit  Medication Sig Dispense Refill  . acetaminophen-codeine (TYLENOL #3) 300-30 MG per tablet TAKE 1/2 TO 1 TABLET BY MOUTH EVERY 6 HOURS AS NEEDED 30 tablet 0  . amLODipine (NORVASC) 2.5 MG tablet Take 1 tablet (2.5 mg total) by mouth daily. 90 tablet 1  . amLODipine (NORVASC) 2.5 MG tablet Take 1 tablet (2.5 mg total) by mouth daily. 90 tablet 1  . terbinafine (LAMISIL) 250 MG tablet Take 1 tablet (250 mg total) by mouth daily. Use for 12 weeks for toenail fungus 30 tablet 2  . naproxen sodium (ANAPROX) 220 MG tablet Take 220 mg by mouth as needed (for pain).     Marland Kitchen neomycin-bacitracin-polymyxin (NEOSPORIN) ointment Apply 1 application topically as needed for wound care.     No current facility-administered medications on file prior to visit.    Review of Systems:  As per HPI- otherwise negative.   Physical Examination: Filed Vitals:   10/16/14 1234  BP: 124/70  Pulse: 65  Temp: 98.3 F (36.8 C)  Resp: 17   Filed Vitals:   10/16/14 1234  Height: 5' 5.5" (1.664 m)  Weight: 140 lb 12.8 oz (63.866 kg)   Body mass index is 23.07 kg/(m^2). Ideal Body Weight: Weight in (lb) to have BMI = 25: 152.2  GEN: WDWN, NAD, Non-toxic, A & O x 3, well appearing older lady here today with her daughter in law HEENT: Atraumatic, Normocephalic. Neck supple. No masses, No LAD. Ears and Nose: No external deformity. CV: RRR, No M/G/R. No JVD. No thrill. No extra heart sounds. PULM: CTA B, no wheezes, crackles, rhonchi. No retractions. No resp. distress. No accessory muscle use. ABD: S, NT, ND, +BS.  No rebound. No HSM. EXTR: No c/c/e NEURO Normal gait.  PSYCH: Normally interactive. Conversant. Not depressed or anxious appearing.  Calm demeanor.  Left foot: there is redness and tenderness about the great toenail, and this nail seems loose. No purulence, mild heat.  She has poor circulation and coolness to the foot, cannot palpate pulses She has 1+ pitting edema of the left ankle and trace on the right  Assessment and Plan: Foot ulcer, left, limited to breakdown of skin - Plan: lidocaine (LMX) 4 % cream  Swelling of lower extremity   She notes pain when they have to clean and change the bandage on her foot at home and at wound care.  rx for topical lidocaine to use as needed prior to procedures.   Discussed swelling in detail.  It is possible that her edema is related to use of norvasc.  Will have her stop this for a few days while she also continues her bactrim.  If swelling does not improve will plan to send for venous dopplers to rule- out DCT; however swelling is most likely due to wound in her leg.  Assuming swelling does improve will have her go back on norvasc and see if it comes back  BP Readings from Last 3 Encounters:  10/16/14 124/70  09/15/14 100/57  09/13/14 145/74     Signed Lamar Blinks, MD

## 2014-10-16 NOTE — Patient Instructions (Signed)
Try not taking the amlodipine (for blood pressure) for a few days to see if it makes a difference in your leg swelling.   I am hopeful that your current antibiotic will help heal your wound.   Please give me a call if your foot is not continuing to improve or if the swelling does not go down.  If the swelling does improve we can try going back on the amlodipine for your pressure

## 2014-10-17 ENCOUNTER — Emergency Department (HOSPITAL_COMMUNITY): Payer: Medicare Other

## 2014-10-17 ENCOUNTER — Encounter (HOSPITAL_COMMUNITY): Payer: Self-pay | Admitting: *Deleted

## 2014-10-17 ENCOUNTER — Emergency Department (HOSPITAL_COMMUNITY)
Admission: EM | Admit: 2014-10-17 | Discharge: 2014-10-17 | Disposition: A | Discharge status: Home / Self Care | Payer: Medicare Other | Attending: Emergency Medicine | Admitting: Emergency Medicine

## 2014-10-17 DIAGNOSIS — S91202A Unspecified open wound of left great toe with damage to nail, initial encounter: Secondary | ICD-10-CM | POA: Diagnosis not present

## 2014-10-17 DIAGNOSIS — I10 Essential (primary) hypertension: Secondary | ICD-10-CM | POA: Diagnosis not present

## 2014-10-17 DIAGNOSIS — Y939 Activity, unspecified: Secondary | ICD-10-CM | POA: Insufficient documentation

## 2014-10-17 DIAGNOSIS — Z79899 Other long term (current) drug therapy: Secondary | ICD-10-CM | POA: Insufficient documentation

## 2014-10-17 DIAGNOSIS — X58XXXA Exposure to other specified factors, initial encounter: Secondary | ICD-10-CM | POA: Diagnosis not present

## 2014-10-17 DIAGNOSIS — R600 Localized edema: Secondary | ICD-10-CM

## 2014-10-17 DIAGNOSIS — Z4800 Encounter for change or removal of nonsurgical wound dressing: Secondary | ICD-10-CM | POA: Diagnosis present

## 2014-10-17 DIAGNOSIS — Z792 Long term (current) use of antibiotics: Secondary | ICD-10-CM | POA: Diagnosis not present

## 2014-10-17 DIAGNOSIS — Y998 Other external cause status: Secondary | ICD-10-CM | POA: Diagnosis not present

## 2014-10-17 DIAGNOSIS — Y929 Unspecified place or not applicable: Secondary | ICD-10-CM | POA: Insufficient documentation

## 2014-10-17 DIAGNOSIS — M79675 Pain in left toe(s): Secondary | ICD-10-CM | POA: Diagnosis not present

## 2014-10-17 DIAGNOSIS — S91102A Unspecified open wound of left great toe without damage to nail, initial encounter: Secondary | ICD-10-CM | POA: Diagnosis not present

## 2014-10-17 LAB — BASIC METABOLIC PANEL
Anion gap: 8 (ref 5–15)
BUN: 6 mg/dL (ref 6–20)
CO2: 21 mmol/L — AB (ref 22–32)
CREATININE: 0.61 mg/dL (ref 0.44–1.00)
Calcium: 8.9 mg/dL (ref 8.9–10.3)
Chloride: 105 mmol/L (ref 101–111)
GFR calc Af Amer: >60 mL/min (ref >60)
GFR calc non Af Amer: >60 mL/min (ref >60)
Glucose, Bld: 104 mg/dL — ABNORMAL HIGH (ref 65–99)
Potassium: 4.3 mmol/L (ref 3.5–5.1)
Sodium: 134 mmol/L — ABNORMAL LOW (ref 135–145)

## 2014-10-17 LAB — CBC
HCT: 38.6 % (ref 36.0–46.0)
Hemoglobin: 13.1 g/dL (ref 12.0–15.0)
MCH: 30 pg (ref 26.0–34.0)
MCHC: 33.9 g/dL (ref 30.0–36.0)
MCV: 88.5 fL (ref 78.0–100.0)
Platelets: 238 10*3/uL (ref 150–400)
RBC: 4.36 MIL/uL (ref 3.87–5.11)
RDW: 13.8 % (ref 11.5–15.5)
WBC: 4 10*3/uL (ref 4.0–10.5)

## 2014-10-17 MED ORDER — TRAMADOL HCL 50 MG PO TABS
25.0000 mg | ORAL_TABLET | Freq: Once | ORAL | Status: AC
  Administered 2014-10-17: 25 mg via ORAL
  Filled 2014-10-17: qty 1

## 2014-10-17 MED ORDER — RIVAROXABAN 15 MG PO TABS
15.0000 mg | ORAL_TABLET | Freq: Once | ORAL | Status: AC
  Administered 2014-10-17: 15 mg via ORAL
  Filled 2014-10-17: qty 1

## 2014-10-17 MED ORDER — TRAMADOL HCL 50 MG PO TABS: 25.0000 mg | ORAL_TABLET | Freq: Four times a day (QID) | ORAL | Status: DC | PRN

## 2014-10-17 NOTE — Discharge Instructions (Signed)
Your toe wound appears to be doing well on the antibiotics you're taking. Continue taking those, and continue keeping the wound clean and dry with a bandage over it. Use ice or heat to the area for pain relief, 20 minutes at a time every hour. Use tylenol or motrin as needed for pain, or use ultram as directed as needed for additional pain relief. Don't drive or operate machinery while taking ultram. Follow up with Dr. Dellia Nims at your already scheduled appointment this week. Return to the ER for changes or worsening symptoms.   FOR YOUR ULTRASOUND- IMPORTANT PATIENT INSTRUCTIONS:  You have been scheduled for an Outpatient Vascular Study at [facility].  If tomorrow is a Saturday or Sunday, please go to the [facility] Emergency Department Registration Desk at 8:30 am tomorrow morning and tell them you are there for a vascular study.  If tomorrow is a weekday (Monday-Friday), please go to [facility] at 8 am and tell them you are there for a vascular study.   Wound Care Wound care helps prevent pain and infection.  You may need a tetanus shot if:  You cannot remember when you had your last tetanus shot.  You have never had a tetanus shot.  The injury broke your skin. If you need a tetanus shot and you choose not to have one, you may get tetanus. Sickness from tetanus can be serious. HOME CARE   Only take medicine as told by your doctor.  Clean the wound daily with mild soap and water.  Change any bandages (dressings) as told by your doctor.  Put medicated cream and a bandage on the wound as told by your doctor.  Change the bandage if it gets wet, dirty, or starts to smell.  Take showers. Do not take baths, swim, or do anything that puts your wound under water.  Rest and raise (elevate) the wound until the pain and puffiness (swelling) are better.  Keep all doctor visits as told. GET HELP RIGHT AWAY IF:   Yellowish-white fluid (pus) comes from the  wound.  Medicine does not lessen your pain.  There is a red streak going away from the wound.  You have a fever. MAKE SURE YOU:   Understand these instructions.  Will watch your condition.  Will get help right away if you are not doing well or get worse. Document Released: 02/05/2008 Document Revised: 07/21/2011 Document Reviewed: 09/01/2010 [facility] Patient Information 2015 [place], [place]. This information is not intended to replace advice given to you by your health care provider. Make sure you discuss any questions you have with your health care provider.  Peripheral Edema You have swelling in your legs (peripheral edema). This swelling is due to excess accumulation of salt and water in your body. Edema may be a sign of heart, kidney or liver disease, or a side effect of a medication. It may also be due to problems in the leg veins. Elevating your legs and using special support stockings may be very helpful, if the cause of the swelling is due to poor venous circulation. Avoid long periods of standing, whatever the cause. Treatment of edema depends on identifying the cause. Chips, pretzels, pickles and other salty foods should be avoided. Restricting salt in your diet is almost always needed. Water pills (diuretics) are often used to remove the excess salt and water from your body via urine. These medicines prevent the kidney from reabsorbing sodium. This increases urine flow. Diuretic treatment may also result  in lowering of potassium levels in your body. Potassium supplements may be needed if you have to use diuretics daily. Daily weights can help you keep track of your progress in clearing your edema. You should call your caregiver for follow up care as recommended. SEEK IMMEDIATE MEDICAL CARE IF:   You have increased swelling, pain, redness, or heat in your legs.  You develop shortness of breath, especially when lying down.  You develop chest or abdominal pain, weakness, or  fainting.  You have a fever. Document Released: 06/05/2004 Document Revised: 07/21/2011 Document Reviewed: 05/16/2009 [facility] Patient Information 2015 [place], [place]. This information is not intended to replace advice given to you by your health care provider. Make sure you discuss any questions you have with your health care provider.  Cryotherapy Cryotherapy means treatment with cold. Ice or gel packs can be used to reduce both pain and swelling. Ice is the most helpful within the first 24 to 48 hours after an injury or flare-up from overusing a muscle or joint. Sprains, strains, spasms, burning pain, shooting pain, and aches can all be eased with ice. Ice can also be used when recovering from surgery. Ice is effective, has very few side effects, and is safe for most people to use. PRECAUTIONS  Ice is not a safe treatment option for people with:  Raynaud phenomenon. This is a condition affecting small blood vessels in the extremities. Exposure to cold may cause your problems to return.  Cold hypersensitivity. There are many forms of cold hypersensitivity, including:  Cold urticaria. Red, itchy hives appear on the skin when the tissues begin to warm after being iced.  Cold erythema. This is a red, itchy rash caused by exposure to cold.  Cold hemoglobinuria. Red blood cells break down when the tissues begin to warm after being iced. The hemoglobin that carry oxygen are passed into the urine because they cannot combine with blood proteins fast enough.  Numbness or altered sensitivity in the area being iced. If you have any of the following conditions, do not use ice until you have discussed cryotherapy with your caregiver:  Heart conditions, such as arrhythmia, angina, or chronic heart disease.  High blood pressure.  Healing wounds or open skin in the area being iced.  Current infections.  Rheumatoid arthritis.  Poor circulation.  Diabetes. Ice slows the blood flow in the region  it is applied. This is beneficial when trying to stop inflamed tissues from spreading irritating chemicals to surrounding tissues. However, if you expose your skin to cold temperatures for too long or without the proper protection, you can damage your skin or nerves. Watch for signs of skin damage due to cold. HOME CARE INSTRUCTIONS Follow these tips to use ice and cold packs safely.  Place a dry or damp towel between the ice and skin. A damp towel will cool the skin more quickly, so you may need to shorten the time that the ice is used.  For a more rapid response, add gentle compression to the ice.  Ice for no more than 10 to 20 minutes at a time. The bonier the area you are icing, the less time it will take to get the benefits of ice.  Check your skin after 5 minutes to make sure there are no signs of a poor response to cold or skin damage.  Rest 20 minutes or more between uses.  Once your skin is numb, you can end your treatment. You can test numbness by very lightly touching  your skin. The touch should be so light that you do not see the skin dimple from the pressure of your fingertip. When using ice, most people will feel these normal sensations in this order: cold, burning, aching, and numbness.  Do not use ice on someone who cannot communicate their responses to pain, such as small children or people with dementia. HOW TO MAKE AN ICE PACK Ice packs are the most common way to use ice therapy. Other methods include ice massage, ice baths, and cryosprays. Muscle creams that cause a cold, tingly feeling do not offer the same benefits that ice offers and should not be used as a substitute unless recommended by your caregiver. To make an ice pack, do one of the following:  Place crushed ice or a bag of frozen vegetables in a sealable plastic bag. Squeeze out the excess air. Place this bag inside another plastic bag. Slide the bag into a pillowcase or place a damp towel between your skin and the  bag.  Mix 3 parts water with 1 part rubbing alcohol. Freeze the mixture in a sealable plastic bag. When you remove the mixture from the freezer, it will be slushy. Squeeze out the excess air. Place this bag inside another plastic bag. Slide the bag into a pillowcase or place a damp towel between your skin and the bag. SEEK MEDICAL CARE IF:  You develop white spots on your skin. This may give the skin a blotchy (mottled) appearance.  Your skin turns blue or pale.  Your skin becomes waxy or hard.  Your swelling gets worse. MAKE SURE YOU:   Understand these instructions.  Will watch your condition.  Will get help right away if you are not doing well or get worse. Document Released: 12/23/2010 Document Revised: 09/12/2013 Document Reviewed: 12/23/2010 [facility] Patient Information 2015 [place], [place]. This information is not intended to replace advice given to you by your health care provider. Make sure you discuss any questions you have with your health care provider.

## 2014-10-17 NOTE — ED Provider Notes (Signed)
CSN: [id number]     Arrival date & time 10/17/14  1824 History   First MD Initiated Contact with Patient 10/17/14 2118     Chief Complaint  Patient presents with  . Wound Check     (Consider location/radiation/quality/duration/timing/severity/associated sxs/prior Treatment) HPI Comments: [name] is a 81 y.o. female with a PMHx of HTN and PAD (diagnosed by Dr. Su Monks surgeon), who presents to the ED with complaints of left great toe wound which has been present for 6 weeks and being cared for by Dr. Dellia Nims at the [facility]. She states that he saw her on Friday and placed her on Bactrim for an infection under the toenail. She states that he was able to express some of the infection at that visit. She states that overall the swelling and redness has improved since Friday, and the yellow drainage has become less. She states that she came in today due to worsening pain that was unrelieved with her Tylenol 3. She reports that the pain is 8/10 intermittent shocking type pain located in the left great toenail, nonradiating, worse with touching the area, and unrelieved with Tylenol 3. She endorses some warmth, and she is not sure if this is improved since Friday. She denies any fevers, chills, chest pain, shortness breath, abdominal pain, nausea, vomiting, diarrhea, dysuria, hematuria, numbness, tingling, weakness, or red streaking. She has an appt on Friday with Dr. Dellia Nims  Additionally she states that she has had some trace LE swelling bilaterally over the last few weeks, and her PCP was going to schedule an ultrasound as an outpatient.  Patient is a 81 y.o. female presenting with wound check. The history is provided by the patient. No language interpreter was used.  Wound Check This is a recurrent problem. The current episode started more than 1 month ago. The problem occurs intermittently. The problem has been gradually improving. Associated symptoms include arthralgias (L great  toe). Pertinent negatives include no abdominal pain, chest pain, chills, fever, joint swelling, myalgias, nausea, numbness, urinary symptoms, vomiting or weakness. Exacerbated by: touching toe. Treatments tried: tylenol #3. The treatment provided no relief.    Past Medical History  Diagnosis Date  . Hypertension    Past Surgical History  Procedure Laterality Date  . Spine surgery    . Knee surgery  1984  . Peripheral vascular catheterization N/A 09/15/2014    Procedure: Abdominal Aortogram;  Surgeon: Elam Dutch, MD;  Location: [facility] INVASIVE CV LAB CUPID;  Service: Cardiovascular;  Laterality: N/A;  . Peripheral vascular catheterization Bilateral 09/15/2014    Procedure: Lower Extremity Angiography;  Surgeon: Elam Dutch, MD;  Location: [place] INVASIVE CV LAB CUPID;  Service: Cardiovascular;  Laterality: Bilateral;   Family History  Problem Relation Age of Onset  . Diabetes Mother   . Heart disease Mother   . Dementia Sister   . Diabetes Son    History  Substance Use Topics  . Smoking status: Never Smoker   . Smokeless tobacco: Never Used  . Alcohol Use: No   OB History    No data available     Review of Systems  Constitutional: Negative for fever and chills.  Respiratory: Negative for shortness of breath.   Cardiovascular: Positive for leg swelling (trace, ongoing, evaluated by PCP and scheduled for ultrasounds). Negative for chest pain.  Gastrointestinal: Negative for nausea, vomiting, abdominal pain and diarrhea.  Genitourinary: Negative for dysuria and hematuria.  Musculoskeletal: Positive for arthralgias (L great toe). Negative for myalgias and  joint swelling.  Skin: Positive for color change (improving erythema to L great toe) and wound.  Allergic/Immunologic: Negative for immunocompromised state.  Neurological: Negative for weakness and numbness.  Psychiatric/Behavioral: Negative for confusion.   10 Systems reviewed and are negative for acute change except as noted  in the HPI.    Allergies  Doxapap-n  Home Medications   Prior to Admission medications   Medication Sig Start Date End Date Taking? Authorizing Provider  acetaminophen-codeine (TYLENOL #3) 300-30 MG per tablet TAKE 1/2 TO 1 TABLET BY MOUTH EVERY 6 HOURS AS NEEDED 10/05/14  Yes Jessica C Copland, MD  amLODipine (NORVASC) 2.5 MG tablet Take 1 tablet (2.5 mg total) by mouth daily. 10/05/14  Yes Gay Filler Copland, MD  clopidogrel (PLAVIX) 75 MG tablet Take 75 mg by mouth daily.   Yes Historical Provider, MD  neomycin-bacitracin-polymyxin (NEOSPORIN) ointment Apply 1 application topically 2 (two) times daily. Apply every day per patient   Yes Historical Provider, MD  Polyethyl Glycol-Propyl Glycol (SYSTANE FREE OP) Place 1 drop into both eyes daily as needed. For dry eyes   Yes Historical Provider, MD  sulfamethoxazole-trimethoprim (BACTRIM DS,SEPTRA DS) 800-160 MG per tablet Take 1 tablet by mouth 2 (two) times daily. Started medication on 10-14-14   Yes Historical Provider, MD  lidocaine (LMX) 4 % cream Apply 1 application topically as needed. Use as needed for pain prior to toe procedure Patient not taking: Reported on 10/17/2014 10/16/14   Gay Filler Copland, MD   BP 135/74 mmHg  Pulse 61  Temp(Src) 98.2 F (36.8 C) (Oral)  Resp 14  Ht 5\' 5"  (1.651 m)  Wt 140 lb (63.504 kg)  BMI 23.30 kg/m2  SpO2 98% Physical Exam  Constitutional: She is oriented to person, place, and time. Vital signs are normal. She appears well-developed and well-nourished.  Non-toxic appearance. No distress.  Afebrile, nontoxic, NAD  HENT:  Head: Normocephalic and atraumatic.  Mouth/Throat: Oropharynx is clear and moist and mucous membranes are normal.  Eyes: Conjunctivae and EOM are normal. Right eye exhibits no discharge. Left eye exhibits no discharge.  Neck: Normal range of motion. Neck supple.  Cardiovascular: Normal rate, regular rhythm, normal heart sounds and intact distal pulses.  Exam reveals no gallop and no  friction rub.   No murmur heard. Pulses:      Dorsalis pedis pulses are 1+ on the right side, and 1+ on the left side.  Diminished DP pulses bilaterally, found using doppler with strong audible flow, sites marked.  Pulmonary/Chest: Effort normal and breath sounds normal. No respiratory distress. She has no decreased breath sounds. She has no wheezes. She has no rhonchi. She has no rales.  Abdominal: Soft. Normal appearance and bowel sounds are normal. She exhibits no distension. There is no tenderness. There is no rigidity, no rebound and no guarding.  Musculoskeletal: Normal range of motion.       Left foot: There is swelling. There is normal range of motion.       Feet:  MAE x4 Strength and sensation grossly intact Distal pulses intact Trace pedal edema bilaterally, neg homan's bilaterally  L great toe with FROM intact, erythema extending down to base of toe, no warmth, nonTTP, with circular wound along lateral toe and yellowed hypertrophied toenail which is mildly TTP but no active drainage and no surrounding induration/abscess. SEE PICTURE BELOW  Neurological: She is alert and oriented to person, place, and time. She has normal strength. No sensory deficit.  Skin: Skin is warm and  dry. No rash noted.  L great toenail and toe wound as noted above. SEE PICTURE BELOW  Psychiatric: She has a normal mood and affect.  Nursing note and vitals reviewed.     ED Course  Procedures (including critical care time) Labs Review Labs Reviewed  BASIC METABOLIC PANEL - Abnormal; Notable for the following:    Sodium 134 (*)    CO2 21 (*)    Glucose, Bld 104 (*)    All other components within normal limits  CBC    Imaging Review Dg Foot Complete Left  10/17/2014   CLINICAL DATA:  Open wound to left great toe for 4 weeks.  EXAM: LEFT FOOT - COMPLETE 3+ VIEW  COMPARISON:  None.  FINDINGS: No radiographic changes of osteomyelitis. No bone destruction. No fracture, subluxation or dislocation. Soft  tissues appear intact.  IMPRESSION: No acute bony abnormality.   Electronically Signed   By: Rolm Baptise M.D.   On: 10/17/2014 20:21     EKG Interpretation None      MDM   Final diagnoses:  Open wound of left great toe with damage to nail, initial encounter  Great toe pain, left  Bilateral lower extremity edema    81 y.o. female here with L great toe wound which overall is improving, but the pain is unrelieved with tylenol #3. Seen by Dr. Dellia Nims of wound care center on Friday and started on Bactrim, which seems to have helped the erythema. Still draining somewhat, but less than on Friday. On exam, no red streaking, no palpable induration or paronychia to drain, mild erythema around L great toe without tenderness. CBC and BMP unremarkable. Foot xray showing no osteomyelitis or soft tissue swelling. This seems to be more of a pain control issue than a worsening infection, and in fact seems that the infection is actually improving. Will give improved pain control using ultram. Of note, she states she's scheduled to have ultrasounds of her legs to eval for why she's had more swelling, denies CP or SOB, and has trace edema, therefore will set up DVT study for tomorrow and give xarelto here. Doubt need for further work up [employer]. I explained the diagnosis and have given explicit precautions to return to the ER including for any other new or worsening symptoms. The patient understands and accepts the medical plan as it's been dictated and I have answered their questions. Discharge instructions concerning home care and prescriptions have been given. The patient is STABLE and is discharged to home in good condition.  BP 135/74 mmHg  Pulse 61  Temp(Src) 98.2 F (36.8 C) (Oral)  Resp 14  Ht 5\' 5"  (1.651 m)  Wt 140 lb (63.504 kg)  BMI 23.30 kg/m2  SpO2 98%  Meds ordered this encounter  Medications  . Rivaroxaban (XARELTO) tablet 15 mg    Sig:   . traMADol (ULTRAM) tablet 25 mg    Sig:   .  traMADol (ULTRAM) 50 MG tablet    Sig: Take 0.5 tablets (25 mg total) by mouth every 6 (six) hours as needed for moderate pain or severe pain.    Dispense:  10 tablet    Refill:  0    Order Specific Question:  Supervising Provider    Answer:  Noemi Chapel [[id number]]     Landri Dorsainvil Camprubi-Soms, PA-C 10/17/14 2259  Evelina Bucy, MD 10/18/14 1025

## 2014-10-17 NOTE — ED Notes (Signed)
Pt in for wound evaluation on her left foot and left great toe, pt has been treated at the wound care center but is c/o increased pain, sent here for evaluation to make sure infection hasn't spread into bone

## 2014-10-18 ENCOUNTER — Ambulatory Visit (HOSPITAL_COMMUNITY)
Admission: RE | Admit: 2014-10-18 | Discharge: 2014-10-18 | Disposition: A | Discharge status: Home / Self Care | Payer: Medicare Other | Source: Ambulatory Visit | Attending: Physician Assistant | Admitting: Physician Assistant

## 2014-10-18 DIAGNOSIS — M7989 Other specified soft tissue disorders: Secondary | ICD-10-CM | POA: Diagnosis not present

## 2014-10-18 NOTE — Progress Notes (Signed)
*  Preliminary Results* Bilateral lower extremity venous duplex completed. Bilateral lower extremities are negative for deep vein thrombosis. There is no evidence of Baker's cyst bilaterally.  10/18/2014  Maudry Mayhew, RVT, RDCS, RDMS

## 2014-10-19 ENCOUNTER — Telehealth: Payer: Self-pay

## 2014-10-19 NOTE — Telephone Encounter (Signed)
Pt's daughter-in-law Hilda Blades called wanting Dr. Lorelei Pont to know Jimmye went to the ER on 6/7. She was prescribed Tramadol 50/mg-half a pill every 6 hours. She has 8 days left. She would like her acetaminophen-codeine (TYLENOL #3) 300-30 MG per tablet [[id number]] to be switched to the Tramadol. She also had an ultra sound done on her legs and there were no blood clots found. She would also like to know if Mixtli should start taking her BP medication again. Please advise at [phone]

## 2014-10-19 NOTE — Telephone Encounter (Signed)
Dr Lorelei Pont-- Please advise.

## 2014-10-20 DIAGNOSIS — I739 Peripheral vascular disease, unspecified: Secondary | ICD-10-CM | POA: Diagnosis not present

## 2014-10-20 DIAGNOSIS — B351 Tinea unguium: Secondary | ICD-10-CM | POA: Diagnosis not present

## 2014-10-20 DIAGNOSIS — L97521 Non-pressure chronic ulcer of other part of left foot limited to breakdown of skin: Secondary | ICD-10-CM | POA: Diagnosis not present

## 2014-10-20 MED ORDER — TRAMADOL HCL 50 MG PO TABS: 25.0000 mg | ORAL_TABLET | Freq: Four times a day (QID) | ORAL | Status: DC | PRN

## 2014-10-20 NOTE — Telephone Encounter (Signed)
Called [name] back.  LMOM.  Thanks for the update.  Will send in more tramadol for her.  Good news that she does not have a DVT.  They can certainly have her take her amlodipine; if it seems to worsen her leg swelling we can think about other options  Meds ordered this encounter  Medications  . traMADol (ULTRAM) 50 MG tablet    Sig: Take 0.5 tablets (25 mg total) by mouth every 6 (six) hours as needed for moderate pain or severe pain.    Dispense:  60 tablet    Refill:  0

## 2014-10-25 ENCOUNTER — Telehealth: Payer: Self-pay | Admitting: Family Medicine

## 2014-10-25 DIAGNOSIS — R609 Edema, unspecified: Secondary | ICD-10-CM

## 2014-10-25 DIAGNOSIS — I10 Essential (primary) hypertension: Secondary | ICD-10-CM

## 2014-10-25 NOTE — Telephone Encounter (Signed)
Patient's daughter in law states that there is still fluid in her legs and she is requesting a water pill  [phone]

## 2014-10-26 MED ORDER — HYDROCHLOROTHIAZIDE 12.5 MG PO TABS: 12.5000 mg | ORAL_TABLET | Freq: Every day | ORAL | Status: DC

## 2014-10-26 NOTE — Addendum Note (Signed)
Addended by: Jaynee Eagles on: 10/26/2014 03:52 PM   Modules accepted: Orders

## 2014-10-26 NOTE — Telephone Encounter (Signed)
She should hold Norvasc for now. Due to her history of high blood pressure I think using a diuretic like hydrochlorothiazide is appropriate. I reviewed [place] results that show no blood clots. And per Dr. Lillie Fragmin note, her leg swelling may be more due to her wound than Norvasc but we can use 12.5mg  of HCT for now. I do recommend she rtc to be followed up with Dr. Lorelei Pont.  Thank you!

## 2014-10-27 DIAGNOSIS — L97521 Non-pressure chronic ulcer of other part of left foot limited to breakdown of skin: Secondary | ICD-10-CM | POA: Diagnosis not present

## 2014-10-27 DIAGNOSIS — B351 Tinea unguium: Secondary | ICD-10-CM | POA: Diagnosis not present

## 2014-10-27 DIAGNOSIS — I739 Peripheral vascular disease, unspecified: Secondary | ICD-10-CM | POA: Diagnosis not present

## 2014-10-27 NOTE — Telephone Encounter (Signed)
Left message for pt to call back  °

## 2014-10-30 NOTE — Telephone Encounter (Signed)
Spoke with daughter advised message from [place]. She understood and will bring her in for follow up.

## 2014-11-03 DIAGNOSIS — I739 Peripheral vascular disease, unspecified: Secondary | ICD-10-CM | POA: Diagnosis not present

## 2014-11-03 DIAGNOSIS — B351 Tinea unguium: Secondary | ICD-10-CM | POA: Diagnosis not present

## 2014-11-03 DIAGNOSIS — L97521 Non-pressure chronic ulcer of other part of left foot limited to breakdown of skin: Secondary | ICD-10-CM | POA: Diagnosis not present

## 2014-11-10 ENCOUNTER — Encounter (HOSPITAL_BASED_OUTPATIENT_CLINIC_OR_DEPARTMENT_OTHER): Payer: Medicare Other | Attending: Internal Medicine

## 2014-11-10 DIAGNOSIS — L930 Discoid lupus erythematosus: Secondary | ICD-10-CM | POA: Insufficient documentation

## 2014-11-10 DIAGNOSIS — B351 Tinea unguium: Secondary | ICD-10-CM | POA: Insufficient documentation

## 2014-11-10 DIAGNOSIS — I10 Essential (primary) hypertension: Secondary | ICD-10-CM | POA: Diagnosis not present

## 2014-11-10 DIAGNOSIS — I739 Peripheral vascular disease, unspecified: Secondary | ICD-10-CM | POA: Diagnosis not present

## 2014-11-10 DIAGNOSIS — L97521 Non-pressure chronic ulcer of other part of left foot limited to breakdown of skin: Secondary | ICD-10-CM | POA: Diagnosis not present

## 2014-11-16 ENCOUNTER — Ambulatory Visit (INDEPENDENT_AMBULATORY_CARE_PROVIDER_SITE_OTHER): Payer: Medicare Other | Admitting: Family Medicine

## 2014-11-16 VITALS — BP 126/68 | HR 72 | Temp 98.0°F | Resp 18 | Ht 65.5 in | Wt 133.6 lb

## 2014-11-16 DIAGNOSIS — L98499 Non-pressure chronic ulcer of skin of other sites with unspecified severity: Secondary | ICD-10-CM | POA: Diagnosis not present

## 2014-11-16 DIAGNOSIS — I739 Peripheral vascular disease, unspecified: Secondary | ICD-10-CM | POA: Diagnosis not present

## 2014-11-16 DIAGNOSIS — L97501 Non-pressure chronic ulcer of other part of unspecified foot limited to breakdown of skin: Secondary | ICD-10-CM | POA: Diagnosis not present

## 2014-11-16 NOTE — Progress Notes (Signed)
[facility] [address] [place] [address] [phone]  Date:  11/16/2014   Name:  [name]   DOB:  1932-03-02   MRN:  [id number]  PCP:  Lamar Blinks, MD    Chief Complaint: Follow-up   History of Present Illness:  [name] is a 81 y.o. very pleasant female patient who presents with the following:  Here to recheck a wound on her left great toe  She is visiting the wound care center weekly and having debridement.  She is a bit frustrated because she feels like the wound will lok great, then "they scrape it again" and she will have more pain and weeping.  However overall she does admit that her foot looks better.  She has terrible circulation in this leg and knows that she may be eventually looking an an amputation but would like to delay this as long as she can. No fever, she is overall feeling well Here today with her DIL who is her main helper and a good friend  She also continues to have swelling of her left leg- stopping the amlodipine and changing to HCTZ. We did a doppler last month which was negative  Patient Active Problem List   Diagnosis Date Noted  . Essential hypertension 08/22/2014    Past Medical History  Diagnosis Date  . Hypertension     Past Surgical History  Procedure Laterality Date  . Spine surgery    . Knee surgery  1984  . Peripheral vascular catheterization N/A 09/15/2014    Procedure: Abdominal Aortogram;  Surgeon: Elam Dutch, MD;  Location: [facility] INVASIVE CV LAB CUPID;  Service: Cardiovascular;  Laterality: N/A;  . Peripheral vascular catheterization Bilateral 09/15/2014    Procedure: Lower Extremity Angiography;  Surgeon: Elam Dutch, MD;  Location: [place] INVASIVE CV LAB CUPID;  Service: Cardiovascular;  Laterality: Bilateral;    History  Substance Use Topics  . Smoking status: Never Smoker   . Smokeless tobacco: Never Used  . Alcohol Use: No    Family History  Problem Relation Age of Onset   . Diabetes Mother   . Heart disease Mother   . Dementia Sister   . Diabetes Son     Allergies  Allergen Reactions  . Doxapap-N [Propoxyphene] Nausea And Vomiting    Medication list has been reviewed and updated.  Current Outpatient Prescriptions on File Prior to Visit  Medication Sig Dispense Refill  . acetaminophen-codeine (TYLENOL #3) 300-30 MG per tablet TAKE 1/2 TO 1 TABLET BY MOUTH EVERY 6 HOURS AS NEEDED 30 tablet 0  . amLODipine (NORVASC) 2.5 MG tablet Take 1 tablet (2.5 mg total) by mouth daily. 90 tablet 1  . clopidogrel (PLAVIX) 75 MG tablet Take 75 mg by mouth daily.    . hydrochlorothiazide (HYDRODIURIL) 12.5 MG tablet Take 1 tablet (12.5 mg total) by mouth daily. 30 tablet 3  . neomycin-bacitracin-polymyxin (NEOSPORIN) ointment Apply 1 application topically 2 (two) times daily. Apply every day per patient    . Polyethyl Glycol-Propyl Glycol (SYSTANE FREE OP) Place 1 drop into both eyes daily as needed. For dry eyes    . sulfamethoxazole-trimethoprim (BACTRIM DS,SEPTRA DS) 800-160 MG per tablet Take 1 tablet by mouth 2 (two) times daily. Started medication on 10-14-14    . traMADol (ULTRAM) 50 MG tablet Take 0.5 tablets (25 mg total) by mouth every 6 (six) hours as needed for moderate pain or severe pain. 60 tablet 0  . lidocaine (LMX) 4 %  cream Apply 1 application topically as needed. Use as needed for pain prior to toe procedure (Patient not taking: Reported on 10/17/2014) 45 g 2   No current facility-administered medications on file prior to visit.    Review of Systems:  As per HPI- otherwise negative.   Physical Examination: Filed Vitals:   11/16/14 0809  BP: 126/68  Pulse: 72  Temp: 98 F (36.7 C)  Resp: 18   Filed Vitals:   11/16/14 0809  Height: 5' 5.5" (1.664 m)  Weight: 133 lb 9.6 oz (60.601 kg)   Body mass index is 21.89 kg/(m^2). Ideal Body Weight: Weight in (lb) to have BMI = 25: 152.2  GEN: WDWN, NAD, Non-toxic, A & O x 3, well appearing older  lady HEENT: Atraumatic, Normocephalic. Neck supple. No masses, No LAD. Ears and Nose: No external deformity. CV: RRR, No M/G/R. No JVD. No thrill. No extra heart sounds. PULM: CTA B, no wheezes, crackles, rhonchi. No retractions. No resp. distress. No accessory muscle use. EXTR: No c/c.  She has 1+ edema of the left leg which is chronic NEURO Normal gait.  PSYCH: Normally interactive. Conversant. Not depressed or anxious appearing.  Calm demeanor.  The left great toe shows lack of toenail and a small ulcer adjacent to the nail bed.  Both the nail bed and the ulcer appear to be healing well and show good granulation tissue.  No redness, swelling or pus   Assessment and Plan: Foot ulcer, unspecified laterality, limited to breakdown of skin  Encouraged her that I do think her wound looks good, and is healing. I know that the wound care is not her favorite but it does seem to be working!  Encouraged her to ask some of the questions she has about her wound care of her wound care provider- went over these and wrote them down for her  Signed Lamar Blinks, MD

## 2014-11-16 NOTE — Patient Instructions (Signed)
Please ask your wound care provider some of your questions regarding your foot-  - Is debridement (the "scraping") necessary today?   If so, what is the goal of debridement?   - how many more weeks of wound care do you estimate that we will need? - would it be possible for me to come in less often?  I think that your toe is making good progress!    Try compression stockings (with open toe) to keep fluid out of your leg.   This will improve your comfort level  Please come and see me in about 2 months for a recheck- we can do some labs for you then as well

## 2014-11-17 DIAGNOSIS — B351 Tinea unguium: Secondary | ICD-10-CM | POA: Diagnosis not present

## 2014-11-17 DIAGNOSIS — L97521 Non-pressure chronic ulcer of other part of left foot limited to breakdown of skin: Secondary | ICD-10-CM | POA: Diagnosis not present

## 2014-11-17 DIAGNOSIS — I10 Essential (primary) hypertension: Secondary | ICD-10-CM | POA: Diagnosis not present

## 2014-11-17 DIAGNOSIS — L930 Discoid lupus erythematosus: Secondary | ICD-10-CM | POA: Diagnosis not present

## 2014-11-17 DIAGNOSIS — I739 Peripheral vascular disease, unspecified: Secondary | ICD-10-CM | POA: Diagnosis not present

## 2014-11-24 DIAGNOSIS — L930 Discoid lupus erythematosus: Secondary | ICD-10-CM | POA: Diagnosis not present

## 2014-11-24 DIAGNOSIS — L97521 Non-pressure chronic ulcer of other part of left foot limited to breakdown of skin: Secondary | ICD-10-CM | POA: Diagnosis not present

## 2014-11-24 DIAGNOSIS — B351 Tinea unguium: Secondary | ICD-10-CM | POA: Diagnosis not present

## 2014-11-24 DIAGNOSIS — I10 Essential (primary) hypertension: Secondary | ICD-10-CM | POA: Diagnosis not present

## 2014-11-24 DIAGNOSIS — I739 Peripheral vascular disease, unspecified: Secondary | ICD-10-CM | POA: Diagnosis not present

## 2014-12-02 ENCOUNTER — Telehealth: Payer: Self-pay

## 2014-12-02 NOTE — Telephone Encounter (Signed)
Pt is requesting a refill on tramadal

## 2014-12-04 MED ORDER — TRAMADOL HCL 50 MG PO TABS: 25.0000 mg | ORAL_TABLET | Freq: Four times a day (QID) | ORAL | Status: DC | PRN

## 2014-12-08 DIAGNOSIS — L97521 Non-pressure chronic ulcer of other part of left foot limited to breakdown of skin: Secondary | ICD-10-CM | POA: Diagnosis not present

## 2014-12-08 DIAGNOSIS — I739 Peripheral vascular disease, unspecified: Secondary | ICD-10-CM | POA: Diagnosis not present

## 2014-12-08 DIAGNOSIS — I10 Essential (primary) hypertension: Secondary | ICD-10-CM | POA: Diagnosis not present

## 2014-12-08 DIAGNOSIS — L930 Discoid lupus erythematosus: Secondary | ICD-10-CM | POA: Diagnosis not present

## 2014-12-08 DIAGNOSIS — B351 Tinea unguium: Secondary | ICD-10-CM | POA: Diagnosis not present

## 2014-12-22 ENCOUNTER — Encounter (HOSPITAL_BASED_OUTPATIENT_CLINIC_OR_DEPARTMENT_OTHER): Payer: Medicare Other | Attending: Internal Medicine

## 2014-12-22 DIAGNOSIS — I10 Essential (primary) hypertension: Secondary | ICD-10-CM | POA: Diagnosis not present

## 2014-12-22 DIAGNOSIS — L97521 Non-pressure chronic ulcer of other part of left foot limited to breakdown of skin: Secondary | ICD-10-CM | POA: Insufficient documentation

## 2014-12-22 DIAGNOSIS — L930 Discoid lupus erythematosus: Secondary | ICD-10-CM | POA: Insufficient documentation

## 2014-12-22 DIAGNOSIS — I739 Peripheral vascular disease, unspecified: Secondary | ICD-10-CM | POA: Diagnosis not present

## 2014-12-27 ENCOUNTER — Ambulatory Visit (INDEPENDENT_AMBULATORY_CARE_PROVIDER_SITE_OTHER): Payer: Medicare Other | Admitting: Family Medicine

## 2014-12-27 ENCOUNTER — Encounter: Payer: Self-pay | Admitting: Family Medicine

## 2014-12-27 VITALS — BP 124/78 | HR 69 | Temp 98.4°F | Resp 17 | Ht 64.5 in | Wt 141.0 lb

## 2014-12-27 DIAGNOSIS — I739 Peripheral vascular disease, unspecified: Secondary | ICD-10-CM

## 2014-12-27 DIAGNOSIS — L97529 Non-pressure chronic ulcer of other part of left foot with unspecified severity: Secondary | ICD-10-CM | POA: Diagnosis not present

## 2014-12-27 DIAGNOSIS — L98499 Non-pressure chronic ulcer of skin of other sites with unspecified severity: Secondary | ICD-10-CM | POA: Diagnosis not present

## 2014-12-27 NOTE — Progress Notes (Signed)
[facility] [address], [place] [address] [phone]  Date:  12/27/2014   Name:  [name]   DOB:  1931/10/24   MRN:  [id number]  PCP:  Lamar Blinks, MD    Chief Complaint: Toe Pain   History of Present Illness:  [name] is a 81 y.o. very pleasant female patient who presents with the following:  Here today to recheck ulcer on her left great toe.  We have been dealing with this for several months now, and she has been attending wound care for 14 weeks.  She was seen at Vascular and noted to have unreconstructable disease in May.   She has otherwise been in good health.  Here with her daughter today.  [name] is getting frustrated by the lack of healing in her toe, and wonders if amputation might be a possible option.  She has thought about this and would like to find out more  Patient Active Problem List   Diagnosis Date Noted  . Essential hypertension 08/22/2014    Past Medical History  Diagnosis Date  . Hypertension     Past Surgical History  Procedure Laterality Date  . Spine surgery    . Knee surgery  1984  . Peripheral vascular catheterization N/A 09/15/2014    Procedure: Abdominal Aortogram;  Surgeon: Elam Dutch, MD;  Location: [facility] INVASIVE CV LAB CUPID;  Service: Cardiovascular;  Laterality: N/A;  . Peripheral vascular catheterization Bilateral 09/15/2014    Procedure: Lower Extremity Angiography;  Surgeon: Elam Dutch, MD;  Location: [place] INVASIVE CV LAB CUPID;  Service: Cardiovascular;  Laterality: Bilateral;    Social History  Substance Use Topics  . Smoking status: Never Smoker   . Smokeless tobacco: Never Used  . Alcohol Use: No    Family History  Problem Relation Age of Onset  . Diabetes Mother   . Heart disease Mother   . Dementia Sister   . Diabetes Son     Allergies  Allergen Reactions  . Doxapap-N [Propoxyphene] Nausea And Vomiting    Medication list has been reviewed and  updated.  Current Outpatient Prescriptions on File Prior to Visit  Medication Sig Dispense Refill  . acetaminophen-codeine (TYLENOL #3) 300-30 MG per tablet TAKE 1/2 TO 1 TABLET BY MOUTH EVERY 6 HOURS AS NEEDED 30 tablet 0  . clopidogrel (PLAVIX) 75 MG tablet Take 75 mg by mouth daily.    . hydrochlorothiazide (HYDRODIURIL) 12.5 MG tablet Take 1 tablet (12.5 mg total) by mouth daily. 30 tablet 3  . neomycin-bacitracin-polymyxin (NEOSPORIN) ointment Apply 1 application topically 2 (two) times daily. Apply every day per patient    . Polyethyl Glycol-Propyl Glycol (SYSTANE FREE OP) Place 1 drop into both eyes daily as needed. For dry eyes    . sulfamethoxazole-trimethoprim (BACTRIM DS,SEPTRA DS) 800-160 MG per tablet Take 1 tablet by mouth 2 (two) times daily. Started medication on 10-14-14    . traMADol (ULTRAM) 50 MG tablet Take 0.5 tablets (25 mg total) by mouth every 6 (six) hours as needed for moderate pain or severe pain. 60 tablet 1  . amLODipine (NORVASC) 2.5 MG tablet Take 1 tablet (2.5 mg total) by mouth daily. (Patient not taking: Reported on 12/27/2014) 90 tablet 1  . lidocaine (LMX) 4 % cream Apply 1 application topically as needed. Use as needed for pain prior to toe procedure (Patient not taking: Reported on 10/17/2014) 45 g 2   No current facility-administered medications on file prior to  visit.    Review of Systems:  As per HPI- otherwise negative. She has no acute sx, no fever or chills  Physical Examination: Filed Vitals:   12/27/14 0915  BP: 124/78  Pulse: 69  Temp: 98.4 F (36.9 C)  Resp: 17   Filed Vitals:   12/27/14 0915  Height: 5' 4.5" (1.638 m)  Weight: 141 lb (63.957 kg)   Body mass index is 23.84 kg/(m^2). Ideal Body Weight: Weight in (lb) to have BMI = 25: 147.6  GEN: WDWN, NAD, Non-toxic, A & O x 3, looks well, spry for age 58: Atraumatic, Normocephalic. Neck supple. No masses, No LAD. Ears and Nose: No external deformity. CV: RRR, No M/G/R. No JVD.  No thrill. No extra heart sounds. PULM: CTA B, no wheezes, crackles, rhonchi. No retractions. No resp. distress. No accessory muscle use. EXTR: No c/c/e NEURO Normal gait.  PSYCH: Normally interactive. Conversant. Not depressed or anxious appearing.  Calm demeanor.  Left great toe with non- healing ulcer and avulsed nail.  It is not especially tender. Poor circulation and slow cap refill to the foot.   Assessment and Plan: Ulcer of toe of left foot, with unspecified severity  Non- healing ulcer of the left great toe.  At this point Kelleen is getting frustrated and is starting to wonder about amputation of her leg. I agree that this is something to at least think about.   She has been in wound care for over 3 months and is not making a lot of progress that we can see.    Send an email to her vascular surgeon who is glad to see her in follow-up.  They will call and schedule an appt   Signed Lamar Blinks, MD

## 2014-12-28 ENCOUNTER — Encounter: Payer: Self-pay | Admitting: Family Medicine

## 2015-01-05 DIAGNOSIS — I739 Peripheral vascular disease, unspecified: Secondary | ICD-10-CM | POA: Diagnosis not present

## 2015-01-05 DIAGNOSIS — L97521 Non-pressure chronic ulcer of other part of left foot limited to breakdown of skin: Secondary | ICD-10-CM | POA: Diagnosis not present

## 2015-01-05 DIAGNOSIS — L930 Discoid lupus erythematosus: Secondary | ICD-10-CM | POA: Diagnosis not present

## 2015-01-05 DIAGNOSIS — I10 Essential (primary) hypertension: Secondary | ICD-10-CM | POA: Diagnosis not present

## 2015-01-07 ENCOUNTER — Telehealth: Payer: Self-pay

## 2015-01-07 NOTE — Telephone Encounter (Signed)
Pt is requesting a refill of Tramadol. She will be completely out this evening.

## 2015-01-08 NOTE — Telephone Encounter (Signed)
Called drug store- she still has a RF left.  Called and let her daughter know

## 2015-01-15 ENCOUNTER — Other Ambulatory Visit: Payer: Self-pay | Admitting: Family Medicine

## 2015-01-16 ENCOUNTER — Telehealth: Payer: Self-pay

## 2015-01-16 DIAGNOSIS — L97521 Non-pressure chronic ulcer of other part of left foot limited to breakdown of skin: Secondary | ICD-10-CM

## 2015-01-16 DIAGNOSIS — I739 Peripheral vascular disease, unspecified: Secondary | ICD-10-CM

## 2015-01-16 MED ORDER — TRAMADOL HCL 50 MG PO TABS: ORAL_TABLET | ORAL | Status: DC

## 2015-01-16 MED ORDER — LIDOCAINE 4 % EX CREA: 1.0000 "application " | TOPICAL_CREAM | CUTANEOUS | Status: DC | PRN

## 2015-01-16 MED ORDER — CLOPIDOGREL BISULFATE 75 MG PO TABS: 75.0000 mg | ORAL_TABLET | Freq: Every day | ORAL | Status: DC

## 2015-01-16 NOTE — Telephone Encounter (Signed)
Her Daughter states that he plavix was started by the wound care MD.  They are seeing Dr. Oneida Alar in about 10 days to discuss hier prognosis and perhaps amputation of her leg Refilled her plavix- recommended that she continue this for now and ask Dr. Oneida Alar if this is still indicated Refilled topical lidocaine that she uses as needed Her pain is uncontrolled with current dose of tramadol- she is often taking a whole pill instead of a half.  Will increase her dose to 50- 100 mg every 4-6 hours. Max 300 mg in 24 hours

## 2015-01-16 NOTE — Telephone Encounter (Signed)
Is pt to continue blood thinner prescribed by previous provider???   Best phone [phone]

## 2015-01-17 ENCOUNTER — Telehealth: Payer: Self-pay

## 2015-01-17 MED ORDER — SULFAMETHOXAZOLE-TRIMETHOPRIM 800-160 MG PO TABS: 1.0000 | ORAL_TABLET | Freq: Two times a day (BID) | ORAL | Status: DC

## 2015-01-17 NOTE — Telephone Encounter (Signed)
Spoke with daughter, she is requesting this for her toe. She wants to prevent infection and states Dr. Lorelei Pont knows all about this.

## 2015-01-17 NOTE — Telephone Encounter (Signed)
Left message for pt to call back  °

## 2015-01-17 NOTE — Telephone Encounter (Signed)
Called her back- had to [facility]

## 2015-01-17 NOTE — Telephone Encounter (Signed)
Pt daughter is requesting a antibodic for her mother would not go into further detail

## 2015-01-17 NOTE — Telephone Encounter (Signed)
Called back and reached [facility]. No acute infection but they are worried about some yellow material on her toe, want to head off any infection that may be brewing.  Will rx septra, let us know if any further concerns

## 2015-01-23 ENCOUNTER — Encounter: Payer: Self-pay | Admitting: Vascular Surgery

## 2015-01-25 ENCOUNTER — Encounter: Payer: Self-pay | Admitting: Vascular Surgery

## 2015-01-25 ENCOUNTER — Encounter: Payer: Self-pay | Admitting: *Deleted

## 2015-01-25 ENCOUNTER — Other Ambulatory Visit: Payer: Self-pay | Admitting: *Deleted

## 2015-01-25 ENCOUNTER — Telehealth: Payer: Self-pay

## 2015-01-25 ENCOUNTER — Ambulatory Visit (INDEPENDENT_AMBULATORY_CARE_PROVIDER_SITE_OTHER): Payer: Medicare Other | Admitting: Vascular Surgery

## 2015-01-25 VITALS — BP 158/79 | HR 61 | Temp 98.1°F | Ht 64.5 in | Wt 143.6 lb

## 2015-01-25 DIAGNOSIS — I739 Peripheral vascular disease, unspecified: Secondary | ICD-10-CM | POA: Diagnosis not present

## 2015-01-25 DIAGNOSIS — L98499 Non-pressure chronic ulcer of skin of other sites with unspecified severity: Secondary | ICD-10-CM

## 2015-01-25 NOTE — Telephone Encounter (Signed)
Left VM to call back to get more information.

## 2015-01-25 NOTE — Progress Notes (Signed)
[facility] [place] HISTORY AND PHYSICAL    History of Present Illness:  Patient is a 81 y.o. year old female who presents for evaluation of painful left first toe. The patient has a several month history of what was thought to be an ingrown toenail on the left first toe. This has failed to heal. She was on Keflex for 2 weeks. This did not improve.  She has had no drainage from the toe. She denies history of diabetes. She does have a history of hypertension. She states her cholesterol has been okay. 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 At that time it was mentioned to her that she was unreconstructable and the only option would be a below-knee amputation. She is here today to consider this. She is on Plavix presumably for peripheral arterial disease.    Past Medical History   Diagnosis  Date   .  Hypertension         Past Surgical History   Procedure  Laterality  Date   .  Spine surgery       .  Knee surgery    1984     Social History History   Substance Use Topics   .  Smoking status:  Never Smoker    .  Smokeless tobacco:  Never Used   .  Alcohol Use:  No     Family History Family History   Problem  Relation  Age of Onset   .  Diabetes  Mother     .  Heart disease  Mother     .  Dementia  Sister     .  Diabetes  Son       Allergies  No Known Allergies     Current Outpatient Prescriptions on File Prior to Visit  Medication Sig Dispense Refill  . clopidogrel (PLAVIX) 75 MG tablet Take 1 tablet (75 mg total) by mouth daily. 30 tablet 2  . hydrochlorothiazide (HYDRODIURIL) 12.5 MG tablet Take 1 tablet (12.5 mg total) by mouth daily. 30 tablet 3  . lidocaine (LMX) 4 % cream Apply 1 application topically as needed. Use as needed for pain prior to toe procedure 45 g 2  . neomycin-bacitracin-polymyxin (NEOSPORIN) ointment Apply 1 application topically 2 (two) times daily. Apply  every day per patient    . Polyethyl Glycol-Propyl Glycol (SYSTANE FREE OP) Place 1 drop into both eyes daily as needed. For dry eyes    . sulfamethoxazole-trimethoprim (BACTRIM DS,SEPTRA DS) 800-160 MG per tablet Take 1 tablet by mouth 2 (two) times daily. 20 tablet 0  . traMADol (ULTRAM) 50 MG tablet Take 1 or 2 every 4-6 hours as needed for pain.  Max 300 mg (6 pills) in 24 hours 90 tablet 1  . acetaminophen-codeine (TYLENOL #3) 300-30 MG per tablet TAKE 1/2 TO 1 TABLET BY MOUTH EVERY 6 HOURS AS NEEDED (Patient not taking: Reported on 01/25/2015) 30 tablet 0  . amLODipine (NORVASC) 2.5 MG tablet Take 1 tablet (2.5 mg total) by mouth daily. (Patient not taking: Reported on 12/27/2014) 90 tablet 1   No current facility-administered medications on file prior to visit.    ROS:    General:  No weight loss, Fever, chills  HEENT: No recent headaches, no nasal bleeding, no visual changes, no sore throat  Neurologic: No dizziness, blackouts, seizures. No recent symptoms of stroke or mini- stroke. No recent episodes of slurred speech, or temporary blindness.  Cardiac: No recent episodes of chest pain/pressure, no shortness of breath at rest.  + shortness of breath with exertion.  Denies history of atrial fibrillation states her doctor has noticed an occasional skipped beat.  Vascular: + history of rest pain in feet.  No history of claudication.  + history of non-healing ulcer, No history of DVT    Pulmonary: No home oxygen, no productive cough, no hemoptysis,  No asthma or wheezing  Musculoskeletal:  [ ]  Arthritis, [ ]  Low back pain,  [ ]  Joint pain  Hematologic:No history of hypercoagulable state.  No history of easy bleeding.  No history of anemia  Gastrointestinal: No hematochezia or melena,  No gastroesophageal reflux, no trouble swallowing  Urinary: [ ]  chronic Kidney disease, [ ]  on HD - [ ]  MWF or [ ]  TTHS, [ ]  Burning with urination, [ ]  Frequent urination, [ ]  Difficulty urinating;     Skin: No rashes  Psychological: No history of anxiety,  No history of depression   Physical Examination    Filed Vitals:   01/25/15 1037 01/25/15 1042  BP: 152/81 158/79  Pulse: 61   Temp: 98.1 F (36.7 C)   TempSrc: Oral   Height: 5' 4.5" (1.638 m)   Weight: 143 lb 9.6 oz (65.137 kg)   SpO2: 96%     General:  Alert and oriented, no acute distress HEENT: Normal Neck: No bruit or JVD Pulmonary: Clear to auscultation bilaterally Cardiac: Regular Rate and Rhythm without murmur Abdomen: Soft, non-tender, non-distended, no mass, thin, aortic pulsation palpable Skin: No rash, erythema left first toe extending the metatarsal head no open wounds slight crack and skin with no drainage crack is over the first metatarsal head, she is missing the toenail from the left first toe. There is an open wound in this area. Extremity Pulses:  2+ radial, brachial, femoral, 3+ popliteal bilaterally absent dorsalis pedis, posterior tibial pulses bilaterally Musculoskeletal: No deformity trace pretibial and pedal edema      Neurologic: Upper and lower extremity motor 5/5 and symmetric  DATA:  Patient had bilateral ABIs previously. ABI on the right was 0.6 left was also 0.6   ASSESSMENT:  Nonhealing wound left first toe. Patient has ABIs consistent with bilateral peripheral arterial disease which is unreconstructable   PLAN:  patient this point wishes to proceed with left below-knee amputation. This will be scheduled for Monday, 01/29/2015. Risks benefits possible palpitations procedure details were discussed patient and her daughter today. These include but are not limited to myocardial events wound infection bleeding possibility of a nonhealing amputation 10-15% chance of revision to an above-knee amputation.  [name], MD [facility] [place] Office: [phone] Pager: [phone]

## 2015-01-25 NOTE — Telephone Encounter (Signed)
Pt's daughter in law is wanting to talk with dr copland -about patient surgery on Monday   Best number [phone]

## 2015-01-26 ENCOUNTER — Encounter (HOSPITAL_COMMUNITY): Payer: Self-pay | Admitting: *Deleted

## 2015-01-26 NOTE — Telephone Encounter (Signed)
Called [name]- they plan to do the amputation below the knee this coming monday

## 2015-01-29 ENCOUNTER — Inpatient Hospital Stay (HOSPITAL_COMMUNITY)
Admission: RE | Admit: 2015-01-29 | Discharge: 2015-01-31 | DRG: 240 | Disposition: A | Discharge status: Rehabilitation Facility | Payer: Medicare Other | Source: Ambulatory Visit | Attending: Vascular Surgery | Admitting: Vascular Surgery

## 2015-01-29 ENCOUNTER — Inpatient Hospital Stay (HOSPITAL_COMMUNITY): Payer: Medicare Other | Admitting: Anesthesiology

## 2015-01-29 ENCOUNTER — Encounter (HOSPITAL_COMMUNITY)
Admission: RE | Disposition: A | Discharge status: Rehabilitation Facility | Payer: Self-pay | Source: Ambulatory Visit | Attending: Vascular Surgery

## 2015-01-29 ENCOUNTER — Encounter (HOSPITAL_COMMUNITY): Payer: Self-pay | Admitting: Anesthesiology

## 2015-01-29 DIAGNOSIS — S81802S Unspecified open wound, left lower leg, sequela: Secondary | ICD-10-CM | POA: Diagnosis not present

## 2015-01-29 DIAGNOSIS — E876 Hypokalemia: Secondary | ICD-10-CM | POA: Diagnosis not present

## 2015-01-29 DIAGNOSIS — Z79899 Other long term (current) drug therapy: Secondary | ICD-10-CM

## 2015-01-29 DIAGNOSIS — I739 Peripheral vascular disease, unspecified: Principal | ICD-10-CM | POA: Diagnosis present

## 2015-01-29 DIAGNOSIS — Z8542 Personal history of malignant neoplasm of other parts of uterus: Secondary | ICD-10-CM

## 2015-01-29 DIAGNOSIS — I10 Essential (primary) hypertension: Secondary | ICD-10-CM | POA: Diagnosis not present

## 2015-01-29 DIAGNOSIS — S81809A Unspecified open wound, unspecified lower leg, initial encounter: Secondary | ICD-10-CM | POA: Diagnosis present

## 2015-01-29 DIAGNOSIS — Z7902 Long term (current) use of antithrombotics/antiplatelets: Secondary | ICD-10-CM

## 2015-01-29 DIAGNOSIS — I998 Other disorder of circulatory system: Secondary | ICD-10-CM | POA: Diagnosis not present

## 2015-01-29 DIAGNOSIS — M79675 Pain in left toe(s): Secondary | ICD-10-CM | POA: Diagnosis not present

## 2015-01-29 DIAGNOSIS — I96 Gangrene, not elsewhere classified: Secondary | ICD-10-CM | POA: Diagnosis not present

## 2015-01-29 DIAGNOSIS — L97529 Non-pressure chronic ulcer of other part of left foot with unspecified severity: Secondary | ICD-10-CM | POA: Diagnosis present

## 2015-01-29 DIAGNOSIS — Z89512 Acquired absence of left leg below knee: Secondary | ICD-10-CM | POA: Diagnosis not present

## 2015-01-29 DIAGNOSIS — I70262 Atherosclerosis of native arteries of extremities with gangrene, left leg: Secondary | ICD-10-CM | POA: Diagnosis not present

## 2015-01-29 DIAGNOSIS — E871 Hypo-osmolality and hyponatremia: Secondary | ICD-10-CM | POA: Diagnosis not present

## 2015-01-29 DIAGNOSIS — L97829 Non-pressure chronic ulcer of other part of left lower leg with unspecified severity: Secondary | ICD-10-CM | POA: Diagnosis not present

## 2015-01-29 HISTORY — DX: Unspecified osteoarthritis, unspecified site: M19.90

## 2015-01-29 HISTORY — PX: AMPUTATION: SHX166

## 2015-01-29 HISTORY — DX: Malignant (primary) neoplasm, unspecified: C80.1

## 2015-01-29 HISTORY — DX: Peripheral vascular disease, unspecified: I73.9

## 2015-01-29 LAB — BASIC METABOLIC PANEL
Anion gap: 10 (ref 5–15)
BUN: 9 mg/dL (ref 6–20)
CHLORIDE: 104 mmol/L (ref 101–111)
CO2: 22 mmol/L (ref 22–32)
Calcium: 9.4 mg/dL (ref 8.9–10.3)
Creatinine, Ser: 0.63 mg/dL (ref 0.44–1.00)
GFR calc Af Amer: >60 mL/min (ref >60)
GLUCOSE: 96 mg/dL (ref 65–99)
POTASSIUM: 4.1 mmol/L (ref 3.5–5.1)
Sodium: 136 mmol/L (ref 135–145)

## 2015-01-29 LAB — CBC
HCT: 40.9 % (ref 36.0–46.0)
Hemoglobin: 13.8 g/dL (ref 12.0–15.0)
MCH: 30.2 pg (ref 26.0–34.0)
MCHC: 33.7 g/dL (ref 30.0–36.0)
MCV: 89.5 fL (ref 78.0–100.0)
PLATELETS: 226 10*3/uL (ref 150–400)
RBC: 4.57 MIL/uL (ref 3.87–5.11)
RDW: 13.6 % (ref 11.5–15.5)
WBC: 3.2 10*3/uL — ABNORMAL LOW (ref 4.0–10.5)

## 2015-01-29 SURGERY — AMPUTATION BELOW KNEE
Anesthesia: General | Site: Leg Lower | Laterality: Left

## 2015-01-29 MED ORDER — LABETALOL HCL 5 MG/ML IV SOLN: 10.0000 mg | INTRAVENOUS | Status: DC | PRN

## 2015-01-29 MED ORDER — ONDANSETRON HCL 4 MG/2ML IJ SOLN
INTRAMUSCULAR | Status: DC | PRN
  Administered 2015-01-29: 4 mg via INTRAVENOUS

## 2015-01-29 MED ORDER — ACETAMINOPHEN 325 MG RE SUPP: 325.0000 mg | RECTAL | Status: DC | PRN

## 2015-01-29 MED ORDER — PANTOPRAZOLE SODIUM 40 MG PO TBEC
40.0000 mg | DELAYED_RELEASE_TABLET | Freq: Every day | ORAL | Status: DC
  Administered 2015-01-30 – 2015-01-31 (×2): 40 mg via ORAL
  Filled 2015-01-29 (×2): qty 1

## 2015-01-29 MED ORDER — SODIUM CHLORIDE 0.9 % IV SOLN: INTRAVENOUS | Status: DC

## 2015-01-29 MED ORDER — ACETAMINOPHEN 325 MG PO TABS: 325.0000 mg | ORAL_TABLET | ORAL | Status: DC | PRN

## 2015-01-29 MED ORDER — HYDROCHLOROTHIAZIDE 12.5 MG PO CAPS
12.5000 mg | ORAL_CAPSULE | Freq: Every day | ORAL | Status: DC
  Administered 2015-01-30 – 2015-01-31 (×2): 12.5 mg via ORAL
  Filled 2015-01-29 (×2): qty 1

## 2015-01-29 MED ORDER — LIDOCAINE HCL (CARDIAC) 20 MG/ML IV SOLN
INTRAVENOUS | Status: DC | PRN
  Administered 2015-01-29: 40 mg via INTRAVENOUS

## 2015-01-29 MED ORDER — TRAMADOL HCL 50 MG PO TABS: 25.0000 mg | ORAL_TABLET | ORAL | Status: DC

## 2015-01-29 MED ORDER — ENOXAPARIN SODIUM 40 MG/0.4ML ~~LOC~~ SOLN
40.0000 mg | SUBCUTANEOUS | Status: DC
  Administered 2015-01-30 – 2015-01-31 (×2): 40 mg via SUBCUTANEOUS
  Filled 2015-01-29 (×2): qty 0.4

## 2015-01-29 MED ORDER — DEXTROSE 5 % IV SOLN
INTRAVENOUS | Status: AC
  Filled 2015-01-29: qty 1.5

## 2015-01-29 MED ORDER — GUAIFENESIN-DM 100-10 MG/5ML PO SYRP: 15.0000 mL | ORAL_SOLUTION | ORAL | Status: DC | PRN

## 2015-01-29 MED ORDER — LIDOCAINE HCL (CARDIAC) 20 MG/ML IV SOLN
INTRAVENOUS | Status: AC
  Filled 2015-01-29: qty 5

## 2015-01-29 MED ORDER — ONDANSETRON HCL 4 MG/2ML IJ SOLN: 4.0000 mg | Freq: Once | INTRAMUSCULAR | Status: DC | PRN

## 2015-01-29 MED ORDER — FENTANYL CITRATE (PF) 250 MCG/5ML IJ SOLN
INTRAMUSCULAR | Status: AC
  Filled 2015-01-29: qty 5

## 2015-01-29 MED ORDER — PHENOL 1.4 % MT LIQD: 1.0000 | OROMUCOSAL | Status: DC | PRN

## 2015-01-29 MED ORDER — SODIUM CHLORIDE 0.45 % IV SOLN
INTRAVENOUS | Status: DC
  Administered 2015-01-30: 03:00:00 via INTRAVENOUS

## 2015-01-29 MED ORDER — ROCURONIUM BROMIDE 50 MG/5ML IV SOLN
INTRAVENOUS | Status: AC
  Filled 2015-01-29: qty 1

## 2015-01-29 MED ORDER — MIDAZOLAM HCL 2 MG/2ML IJ SOLN: 2.0000 mg | Freq: Once | INTRAMUSCULAR | Status: DC

## 2015-01-29 MED ORDER — CLOPIDOGREL BISULFATE 75 MG PO TABS
75.0000 mg | ORAL_TABLET | Freq: Every day | ORAL | Status: DC
  Administered 2015-01-30 – 2015-01-31 (×2): 75 mg via ORAL
  Filled 2015-01-29 (×2): qty 1

## 2015-01-29 MED ORDER — ONDANSETRON HCL 4 MG/2ML IJ SOLN
INTRAMUSCULAR | Status: AC
  Filled 2015-01-29: qty 2

## 2015-01-29 MED ORDER — BACITRACIN-NEOMYCIN-POLYMYXIN 400-5-5000 EX OINT
1.0000 "application " | TOPICAL_OINTMENT | Freq: Two times a day (BID) | CUTANEOUS | Status: DC
  Filled 2015-01-29 (×5): qty 1

## 2015-01-29 MED ORDER — POTASSIUM CHLORIDE CRYS ER 20 MEQ PO TBCR: 20.0000 meq | EXTENDED_RELEASE_TABLET | Freq: Every day | ORAL | Status: DC | PRN

## 2015-01-29 MED ORDER — HYDRALAZINE HCL 20 MG/ML IJ SOLN: 5.0000 mg | INTRAMUSCULAR | Status: DC | PRN

## 2015-01-29 MED ORDER — DEXTROSE 5 % IV SOLN
1.5000 g | INTRAVENOUS | Status: AC
  Administered 2015-01-29: 1.5 g via INTRAVENOUS

## 2015-01-29 MED ORDER — MAGNESIUM SULFATE 2 GM/50ML IV SOLN
2.0000 g | Freq: Every day | INTRAVENOUS | Status: DC | PRN
  Filled 2015-01-29: qty 50

## 2015-01-29 MED ORDER — DOCUSATE SODIUM 100 MG PO CAPS
100.0000 mg | ORAL_CAPSULE | Freq: Every day | ORAL | Status: DC
  Administered 2015-01-30 – 2015-01-31 (×2): 100 mg via ORAL
  Filled 2015-01-29 (×2): qty 1

## 2015-01-29 MED ORDER — LACTATED RINGERS IV SOLN
INTRAVENOUS | Status: DC
  Administered 2015-01-29: 50 mL/h via INTRAVENOUS
  Administered 2015-01-29: 12:00:00 via INTRAVENOUS

## 2015-01-29 MED ORDER — 0.9 % SODIUM CHLORIDE (POUR BTL) OPTIME
TOPICAL | Status: DC | PRN
  Administered 2015-01-29: 1000 mL

## 2015-01-29 MED ORDER — FENTANYL CITRATE (PF) 100 MCG/2ML IJ SOLN
INTRAMUSCULAR | Status: DC | PRN
  Administered 2015-01-29: 50 ug via INTRAVENOUS
  Administered 2015-01-29: 100 ug via INTRAVENOUS
  Administered 2015-01-29 (×4): 50 ug via INTRAVENOUS

## 2015-01-29 MED ORDER — FENTANYL CITRATE (PF) 100 MCG/2ML IJ SOLN
25.0000 ug | INTRAMUSCULAR | Status: DC | PRN
  Administered 2015-01-29 (×3): 50 ug via INTRAVENOUS

## 2015-01-29 MED ORDER — METOPROLOL TARTRATE 1 MG/ML IV SOLN: 2.0000 mg | INTRAVENOUS | Status: DC | PRN

## 2015-01-29 MED ORDER — EPHEDRINE SULFATE 50 MG/ML IJ SOLN
INTRAMUSCULAR | Status: AC
  Filled 2015-01-29: qty 1

## 2015-01-29 MED ORDER — FENTANYL CITRATE (PF) 100 MCG/2ML IJ SOLN
INTRAMUSCULAR | Status: AC
  Filled 2015-01-29: qty 2

## 2015-01-29 MED ORDER — MORPHINE SULFATE (PF) 2 MG/ML IV SOLN
INTRAVENOUS | Status: AC
  Filled 2015-01-29: qty 1

## 2015-01-29 MED ORDER — DEXTROSE 5 % IV SOLN
1.5000 g | Freq: Two times a day (BID) | INTRAVENOUS | Status: AC
  Administered 2015-01-29 – 2015-01-30 (×2): 1.5 g via INTRAVENOUS
  Filled 2015-01-29 (×2): qty 1.5

## 2015-01-29 MED ORDER — TRAMADOL HCL 50 MG PO TABS
25.0000 mg | ORAL_TABLET | ORAL | Status: DC | PRN
  Administered 2015-01-29 – 2015-01-31 (×6): 25 mg via ORAL
  Filled 2015-01-29 (×6): qty 1

## 2015-01-29 MED ORDER — SODIUM CHLORIDE 0.9 % IJ SOLN
INTRAMUSCULAR | Status: AC
  Filled 2015-01-29: qty 10

## 2015-01-29 MED ORDER — ONDANSETRON HCL 4 MG/2ML IJ SOLN: 4.0000 mg | Freq: Four times a day (QID) | INTRAMUSCULAR | Status: DC | PRN

## 2015-01-29 MED ORDER — MORPHINE SULFATE (PF) 2 MG/ML IV SOLN
1.0000 mg | INTRAVENOUS | Status: DC | PRN
  Administered 2015-01-29 (×2): 4 mg via INTRAVENOUS
  Administered 2015-01-29: 2 mg via INTRAVENOUS
  Administered 2015-01-30 (×2): 4 mg via INTRAVENOUS
  Filled 2015-01-29 (×3): qty 2
  Filled 2015-01-29: qty 1
  Filled 2015-01-29 (×2): qty 2

## 2015-01-29 MED ORDER — PROPOFOL 10 MG/ML IV BOLUS
INTRAVENOUS | Status: AC
  Filled 2015-01-29: qty 20

## 2015-01-29 MED ORDER — ALUM & MAG HYDROXIDE-SIMETH 200-200-20 MG/5ML PO SUSP: 15.0000 mL | ORAL | Status: DC | PRN

## 2015-01-29 MED ORDER — CHLORHEXIDINE GLUCONATE CLOTH 2 % EX PADS: 6.0000 | MEDICATED_PAD | Freq: Once | CUTANEOUS | Status: DC

## 2015-01-29 MED ORDER — EPHEDRINE SULFATE 50 MG/ML IJ SOLN
INTRAMUSCULAR | Status: DC | PRN
  Administered 2015-01-29: 10 mg via INTRAVENOUS

## 2015-01-29 MED ORDER — HYDROCHLOROTHIAZIDE 25 MG PO TABS: 12.5000 mg | ORAL_TABLET | Freq: Every day | ORAL | Status: DC

## 2015-01-29 MED ORDER — PROPOFOL 10 MG/ML IV BOLUS
INTRAVENOUS | Status: DC | PRN
  Administered 2015-01-29: 130 mg via INTRAVENOUS

## 2015-01-29 MED ORDER — ACETAMINOPHEN-CODEINE #3 300-30 MG PO TABS
1.0000 | ORAL_TABLET | Freq: Four times a day (QID) | ORAL | Status: DC | PRN
  Administered 2015-01-29: 1 via ORAL
  Filled 2015-01-29 (×2): qty 1

## 2015-01-29 MED ORDER — FENTANYL CITRATE (PF) 100 MCG/2ML IJ SOLN
50.0000 ug | Freq: Once | INTRAMUSCULAR | Status: AC
  Administered 2015-01-29: 50 ug via INTRAVENOUS

## 2015-01-29 SURGICAL SUPPLY — 52 items
BANDAGE ELASTIC 6 VELCRO ST LF (GAUZE/BANDAGES/DRESSINGS) ×2 IMPLANT
BANDAGE ESMARK 6X9 LF (GAUZE/BANDAGES/DRESSINGS) IMPLANT
BLADE SAW RECIP 87.9 MT (BLADE) ×2 IMPLANT
BNDG CMPR 9X6 STRL LF SNTH (GAUZE/BANDAGES/DRESSINGS)
BNDG COHESIVE 6X5 TAN STRL LF (GAUZE/BANDAGES/DRESSINGS) ×2 IMPLANT
BNDG ESMARK 6X9 LF (GAUZE/BANDAGES/DRESSINGS)
BNDG GAUZE ELAST 4 BULKY (GAUZE/BANDAGES/DRESSINGS) ×3 IMPLANT
CANISTER SUCTION 2500CC (MISCELLANEOUS) ×2 IMPLANT
CLIP TI MEDIUM 6 (CLIP) IMPLANT
COVER SURGICAL LIGHT HANDLE (MISCELLANEOUS) ×2 IMPLANT
COVER TABLE BACK 60X90 (DRAPES) ×2 IMPLANT
CUFF TOURNIQUET SINGLE 18IN (TOURNIQUET CUFF) IMPLANT
CUFF TOURNIQUET SINGLE 24IN (TOURNIQUET CUFF) IMPLANT
CUFF TOURNIQUET SINGLE 34IN LL (TOURNIQUET CUFF) ×1 IMPLANT
CUFF TOURNIQUET SINGLE 44IN (TOURNIQUET CUFF) IMPLANT
DRAIN CHANNEL 19F RND (DRAIN) IMPLANT
DRAPE ORTHO SPLIT 77X108 STRL (DRAPES) ×4
DRAPE PROXIMA HALF (DRAPES) ×4 IMPLANT
DRAPE SURG ORHT 6 SPLT 77X108 (DRAPES) ×2 IMPLANT
DRSG ADAPTIC 3X8 NADH LF (GAUZE/BANDAGES/DRESSINGS) ×2 IMPLANT
ELECT REM PT RETURN 9FT ADLT (ELECTROSURGICAL) ×2
ELECTRODE REM PT RTRN 9FT ADLT (ELECTROSURGICAL) ×1 IMPLANT
EVACUATOR SILICONE 100CC (DRAIN) IMPLANT
GAUZE SPONGE 4X4 12PLY STRL (GAUZE/BANDAGES/DRESSINGS) ×4 IMPLANT
GLOVE BIO SURGEON STRL SZ 6.5 (GLOVE) ×2 IMPLANT
GLOVE BIO SURGEON STRL SZ7.5 (GLOVE) ×2 IMPLANT
GLOVE BIOGEL PI IND STRL 6.5 (GLOVE) IMPLANT
GLOVE BIOGEL PI INDICATOR 6.5 (GLOVE) ×3
GLOVE ECLIPSE 6.5 STRL STRAW (GLOVE) ×2 IMPLANT
GOWN STRL REUS W/ TWL LRG LVL3 (GOWN DISPOSABLE) ×3 IMPLANT
GOWN STRL REUS W/TWL LRG LVL3 (GOWN DISPOSABLE) ×8
KIT BASIN OR (CUSTOM PROCEDURE TRAY) ×2 IMPLANT
KIT ROOM TURNOVER OR (KITS) ×2 IMPLANT
NS IRRIG 1000ML POUR BTL (IV SOLUTION) ×2 IMPLANT
PACK GENERAL/GYN (CUSTOM PROCEDURE TRAY) ×2 IMPLANT
PAD ABD 8X10 STRL (GAUZE/BANDAGES/DRESSINGS) ×1 IMPLANT
PAD ARMBOARD 7.5X6 YLW CONV (MISCELLANEOUS) ×4 IMPLANT
PADDING CAST COTTON 6X4 STRL (CAST SUPPLIES) IMPLANT
SPONGE GAUZE 4X4 12PLY STER LF (GAUZE/BANDAGES/DRESSINGS) ×1 IMPLANT
STAPLER VISISTAT 35W (STAPLE) ×2 IMPLANT
STOCKINETTE IMPERVIOUS LG (DRAPES) ×2 IMPLANT
SUT ETHILON 3 0 PS 1 (SUTURE) IMPLANT
SUT SILK 2 0 (SUTURE) ×2
SUT SILK 2 0 SH CR/8 (SUTURE) ×3 IMPLANT
SUT SILK 2-0 18XBRD TIE 12 (SUTURE) ×1 IMPLANT
SUT VIC AB 2-0 CT1 27 (SUTURE) ×4
SUT VIC AB 2-0 CT1 TAPERPNT 27 (SUTURE) ×2 IMPLANT
SUT VIC AB 2-0 SH 18 (SUTURE) ×3 IMPLANT
SUT VIC AB 3-0 SH 27 (SUTURE) ×2
SUT VIC AB 3-0 SH 27X BRD (SUTURE) ×2 IMPLANT
UNDERPAD 30X30 INCONTINENT (UNDERPADS AND DIAPERS) ×2 IMPLANT
WATER STERILE IRR 1000ML POUR (IV SOLUTION) ×2 IMPLANT

## 2015-01-29 NOTE — H&P (View-Only) (Signed)
[facility] [place] HISTORY AND PHYSICAL    History of Present Illness:  [name] is a 81 y.o. year old female who presents for evaluation of painful left first toe. The [name] has a several month history of what was thought to be an ingrown toenail on the left first toe. This has failed to heal. She was on Keflex for 2 weeks. This did not improve.  She has had no drainage from the toe. She denies history of diabetes. She does have a history of hypertension. She states her cholesterol has been okay. 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 At that time it was mentioned to her that she was unreconstructable and the only option would be a below-knee amputation. She is here today to consider this. She is on Plavix presumably for peripheral arterial disease.    Past Medical History   Diagnosis  Date   .  Hypertension         Past Surgical History   Procedure  Laterality  Date   .  Spine surgery       .  Knee surgery    1984     Social History History   Substance Use Topics   .  Smoking status:  Never Smoker    .  Smokeless tobacco:  Never Used   .  Alcohol Use:  No     Family History Family History   Problem  Relation  Age of Onset   .  Diabetes  Mother     .  Heart disease  Mother     .  Dementia  Sister     .  Diabetes  Son       Allergies  No Known Allergies     Current Outpatient Prescriptions on File Prior to Visit  Medication Sig Dispense Refill  . clopidogrel (PLAVIX) 75 MG tablet Take 1 tablet (75 mg total) by mouth daily. 30 tablet 2  . hydrochlorothiazide (HYDRODIURIL) 12.5 MG tablet Take 1 tablet (12.5 mg total) by mouth daily. 30 tablet 3  . lidocaine (LMX) 4 % cream Apply 1 application topically as needed. Use as needed for pain prior to toe procedure 45 g 2  . neomycin-bacitracin-polymyxin (NEOSPORIN) ointment Apply 1 application topically 2 (two) times daily. Apply  every day per [name]    . Polyethyl Glycol-Propyl Glycol (SYSTANE FREE OP) Place 1 drop into both eyes daily as needed. For dry eyes    . sulfamethoxazole-trimethoprim (BACTRIM DS,SEPTRA DS) 800-160 MG per tablet Take 1 tablet by mouth 2 (two) times daily. 20 tablet 0  . traMADol (ULTRAM) 50 MG tablet Take 1 or 2 every 4-6 hours as needed for pain.  Max 300 mg (6 pills) in 24 hours 90 tablet 1  . acetaminophen-codeine (TYLENOL #3) 300-30 MG per tablet TAKE 1/2 TO 1 TABLET BY MOUTH EVERY 6 HOURS AS NEEDED ([name] not taking: Reported on 01/25/2015) 30 tablet 0  . amLODipine (NORVASC) 2.5 MG tablet Take 1 tablet (2.5 mg total) by mouth daily. ([name] not taking: Reported on 12/27/2014) 90 tablet 1   No current facility-administered medications on file prior to visit.    ROS:    General:  No weight loss, Fever, chills  HEENT: No recent headaches, no nasal bleeding, no visual changes, no sore throat  Neurologic: No dizziness, blackouts, seizures. No recent symptoms of stroke or mini- stroke. No recent episodes of slurred speech, or temporary blindness.  Cardiac: No recent episodes of chest pain/pressure, no shortness of breath at rest.  + shortness of breath with exertion.  Denies history of atrial fibrillation states her doctor has noticed an occasional skipped beat.  Vascular: + history of rest pain in feet.  No history of claudication.  + history of non-healing ulcer, No history of DVT    Pulmonary: No home oxygen, no productive cough, no hemoptysis,  No asthma or wheezing  Musculoskeletal:  [ ]  Arthritis, [ ]  Low back pain,  [ ]  Joint pain  Hematologic:No history of hypercoagulable state.  No history of easy bleeding.  No history of anemia  Gastrointestinal: No hematochezia or melena,  No gastroesophageal reflux, no trouble swallowing  Urinary: [ ]  chronic Kidney disease, [ ]  on HD - [ ]  MWF or [ ]  TTHS, [ ]  Burning with urination, [ ]  Frequent urination, [ ]  Difficulty urinating;     Skin: No rashes  Psychological: No history of anxiety,  No history of depression   Physical Examination    Filed Vitals:   01/25/15 1037 01/25/15 1042  BP: 152/81 158/79  Pulse: 61   Temp: 98.1 F (36.7 C)   TempSrc: Oral   Height: 5' 4.5" (1.638 m)   Weight: 143 lb 9.6 oz (65.137 kg)   SpO2: 96%     General:  Alert and oriented, no acute distress HEENT: Normal Neck: No bruit or JVD Pulmonary: Clear to auscultation bilaterally Cardiac: Regular Rate and Rhythm without murmur Abdomen: Soft, non-tender, non-distended, no mass, thin, aortic pulsation palpable Skin: No rash, erythema left first toe extending the metatarsal head no open wounds slight crack and skin with no drainage crack is over the first metatarsal head, she is missing the toenail from the left first toe. There is an open wound in this area. Extremity Pulses:  2+ radial, brachial, femoral, 3+ popliteal bilaterally absent dorsalis pedis, posterior tibial pulses bilaterally Musculoskeletal: No deformity trace pretibial and pedal edema      Neurologic: Upper and lower extremity motor 5/5 and symmetric  DATA:  [name] had bilateral ABIs previously. ABI on the right was 0.6 left was also 0.6   ASSESSMENT:  Nonhealing wound left first toe. [name] has ABIs consistent with bilateral peripheral arterial disease which is unreconstructable   PLAN:  [name] this point wishes to proceed with left below-knee amputation. This will be scheduled for Monday, 01/29/2015. Risks benefits possible palpitations procedure details were discussed [name] and her daughter today. These include but are not limited to myocardial events wound infection bleeding possibility of a nonhealing amputation 10-15% chance of revision to an above-knee amputation.  [name], MD [facility] [place] Office: [phone] Pager: [phone]

## 2015-01-29 NOTE — Op Note (Signed)
Procedure: Left below-knee amputation  Preoperative diagnosis: non healing wound left foot  Postoperative diagnosis: Same  Anesthesia Gen.  Assistant: Silva Bandy PA-C  Upper findings: #1 reasonably well perfused muscle tissue severe peripheral edema  Operative details: After obtaining informed consent, the patient was taken to the operating room. The patient was placed in supine position on the operating table. After induction of general anesthesia and endotracheal intubation, the patient's entire right lower extremity was prepped and draped all the way down to the level of the ankle. A tourniquet was inflated to 300 mm Hg above the knee after exsanguinating the leg with an Esmarch.  Next a transverse incision was made approximately 4 fingerbreadths below the tibial tuberosity on the right leg.  The  incision was carried posteriorly to the midportion of the leg and then extended longitudinally to create a posterior flap. The subcutaneous tissues and fascia was taken down with cautery. There was a continuous stream of edema fluid pouring out of the subcutaneous tissues.  Periosteum was raised on the tibia approximately 5 cm above the skin edge.  The periosteum was also raised on the fibula several centimeters above this. The tibia was then divided with a saw. The fibula was divided with a bone cutter. The leg was then elevated in the operative field and the amputation was completed posterior to the bones with an amputation knife. Hemostasis was then obtained with cautery and several suture ligatures. The tibial and sural nerves were pulled down into the field transected and allowed to retract up into the leg.  After hemostasis was obtained, the wound was thoroughly irrigated with  normal saline solution. The tourniquet was deflated.The fascial edges were then reapproximated with interrupted 2-0 Vicryl sutures. Subcutaneous tissues were reapproximated using running 3-0 Vicryl suture. The skin was closed  with staples. The patient tolerated the procedure well and there were no complications. Instrument sponge and  needle counts were correct at the end of the case. The patient was taken to the recovery room in stable condition.  [name], MD [facility] [place] Office: [phone] Pager: [phone]

## 2015-01-29 NOTE — Anesthesia Procedure Notes (Signed)
Procedure Name: LMA Insertion Date/Time: 01/29/2015 10:19 AM Performed by: Luciana Axe K Pre-anesthesia Checklist: Patient identified, Emergency Drugs available, Suction available, Patient being monitored and Timeout performed Patient Re-evaluated:Patient Re-evaluated prior to inductionOxygen Delivery Method: Circle system utilized Preoxygenation: Pre-oxygenation with 100% oxygen Intubation Type: IV induction Ventilation: Mask ventilation without difficulty LMA: LMA inserted LMA Size: 4.0 Number of attempts: 1 Placement Confirmation: positive ETCO2,  CO2 detector and breath sounds checked- equal and bilateral Tube secured with: Tape Dental Injury: Teeth and Oropharynx as per pre-operative assessment

## 2015-01-29 NOTE — Interval H&P Note (Signed)
History and Physical Interval Note:  01/29/2015 9:38 AM  [name]  has presented today for surgery, with the diagnosis of Left lower extremity gangrene and ischemia  The various methods of treatment have been discussed with the patient and family. After consideration of risks, benefits and other options for treatment, the patient has consented to  Procedure(s): AMPUTATION BELOW KNEE (Left) as a surgical intervention .  The patient's history has been reviewed, patient examined, no change in status, stable for surgery.  I have reviewed the patient's chart and labs.  Questions were answered to the patient's satisfaction.     [name]

## 2015-01-29 NOTE — Transfer of Care (Signed)
Immediate Anesthesia Transfer of Care Note  Patient: [name]  Procedure(s) Performed: Procedure(s): Left  AMPUTATION BELOW KNEE (Left)  Patient Location: PACU  Anesthesia Type:General  Level of Consciousness: awake, oriented and patient cooperative  Airway & Oxygen Therapy: Patient Spontanous Breathing and Patient connected to nasal cannula oxygen  Post-op Assessment: Report given to RN and Post -op Vital signs reviewed and stable  Post vital signs: Reviewed  Last Vitals:  Filed Vitals:   01/29/15 0840  BP: 164/65  Pulse: 68  Temp: 36.6 C  Resp: 20    Complications: No apparent anesthesia complications

## 2015-01-29 NOTE — Anesthesia Preprocedure Evaluation (Addendum)
Anesthesia Evaluation  Patient identified by MRN, date of birth, ID band Patient awake    Reviewed: Allergy & Precautions, NPO status , Patient's Chart, lab work & pertinent test results  History of Anesthesia Complications Negative for: history of anesthetic complications  Airway Mallampati: II  TM Distance: >3 FB Neck ROM: Full    Dental  (+) Edentulous Upper, Edentulous Lower, Dental Advisory Given   Pulmonary neg pulmonary ROS,    breath sounds clear to auscultation       Cardiovascular hypertension, Pt. on medications + Peripheral Vascular Disease   Rhythm:Regular Rate:Normal     Neuro/Psych negative neurological ROS  negative psych ROS   GI/Hepatic negative GI ROS, Neg liver ROS,   Endo/Other  negative endocrine ROS  Renal/GU negative Renal ROS     Musculoskeletal   Abdominal   Peds  Hematology   Anesthesia Other Findings   Reproductive/Obstetrics negative OB ROS                            Anesthesia Physical Anesthesia Plan  ASA: III  Anesthesia Plan: General   Post-op Pain Management:    Induction: Intravenous  Airway Management Planned: LMA  Additional Equipment:   Intra-op Plan:   Post-operative Plan:   Informed Consent: I have reviewed the patients History and Physical, chart, labs and discussed the procedure including the risks, benefits and alternatives for the proposed anesthesia with the patient or authorized representative who has indicated his/her understanding and acceptance.     Plan Discussed with: CRNA and Anesthesiologist  Anesthesia Plan Comments: (Unreconstructable PVD with L. Foot nonhealing ulcer Hypertension  Plan GA with LMA  Roberts Gaudy)        Anesthesia Quick Evaluation

## 2015-01-29 NOTE — Anesthesia Postprocedure Evaluation (Signed)
  Anesthesia Post-op Note  Patient: [name]  Procedure(s) Performed: Procedure(s): Left  AMPUTATION BELOW KNEE (Left)  Patient Location: PACU  Anesthesia Type:General  Level of Consciousness: awake, alert  and oriented  Airway and Oxygen Therapy: Patient Spontanous Breathing and Patient connected to nasal cannula oxygen  Post-op Pain: mild  Post-op Assessment: Post-op Vital signs reviewed, Patient's Cardiovascular Status Stable, Respiratory Function Stable, Patent Airway and Pain level controlled              Post-op Vital Signs: stable  Last Vitals:  Filed Vitals:   01/29/15 1430  BP:   Pulse: 81  Temp:   Resp: 24    Complications: No apparent anesthesia complications

## 2015-01-30 ENCOUNTER — Encounter (HOSPITAL_COMMUNITY): Payer: Self-pay | Admitting: Vascular Surgery

## 2015-01-30 DIAGNOSIS — Z89512 Acquired absence of left leg below knee: Secondary | ICD-10-CM

## 2015-01-30 DIAGNOSIS — S81802S Unspecified open wound, left lower leg, sequela: Secondary | ICD-10-CM

## 2015-01-30 LAB — BASIC METABOLIC PANEL
ANION GAP: 6 (ref 5–15)
BUN: <5 mg/dL — ABNORMAL LOW (ref 6–20)
CALCIUM: 8.3 mg/dL — AB (ref 8.9–10.3)
CHLORIDE: 100 mmol/L — AB (ref 101–111)
CO2: 25 mmol/L (ref 22–32)
Creatinine, Ser: 0.46 mg/dL (ref 0.44–1.00)
GFR calc non Af Amer: >60 mL/min (ref >60)
GLUCOSE: 136 mg/dL — AB (ref 65–99)
POTASSIUM: 3.5 mmol/L (ref 3.5–5.1)
Sodium: 131 mmol/L — ABNORMAL LOW (ref 135–145)

## 2015-01-30 LAB — CBC
HEMATOCRIT: 36.6 % (ref 36.0–46.0)
HEMOGLOBIN: 12.1 g/dL (ref 12.0–15.0)
MCH: 29.4 pg (ref 26.0–34.0)
MCHC: 33.1 g/dL (ref 30.0–36.0)
MCV: 88.8 fL (ref 78.0–100.0)
PLATELETS: 224 10*3/uL (ref 150–400)
RBC: 4.12 MIL/uL (ref 3.87–5.11)
RDW: 13.4 % (ref 11.5–15.5)
WBC: 5.9 10*3/uL (ref 4.0–10.5)

## 2015-01-30 MED ORDER — INFLUENZA VAC SPLIT QUAD 0.5 ML IM SUSY
0.5000 mL | PREFILLED_SYRINGE | INTRAMUSCULAR | Status: AC
  Administered 2015-01-31: 0.5 mL via INTRAMUSCULAR
  Filled 2015-01-30: qty 0.5

## 2015-01-30 NOTE — Progress Notes (Signed)
Rehab admissions - Evaluated for possible admission.  I met with patient and her dtr-in-law.  Currently rehab beds are full.  Patient is doing very well with mobility.  May not need inpatient rehab stay over next couple of days.  Patient open to going directly home or to SNF if needed if no rehab bed open.  I will follow progress for now.  Call me for questions.  #[phone]

## 2015-01-30 NOTE — Progress Notes (Addendum)
  [facility] Progress Note  01/30/2015 8:07 AM POD 1  Subjective:  Pain is better.    Filed Vitals:   01/30/15 0506  BP: 130/60  Pulse: 75  Temp: 99.2 F (37.3 C)  Resp: 20    Physical Exam: Left BKA dressing clean and dry  CBC    Component Value Date/Time   WBC 5.9 01/30/2015 0344   WBC 4.6 08/22/2014 1313   RBC 4.12 01/30/2015 0344   RBC 4.80 08/22/2014 1313   HGB 12.1 01/30/2015 0344   HGB 13.8 08/22/2014 1313   HCT 36.6 01/30/2015 0344   HCT 43.6 08/22/2014 1313   PLT 224 01/30/2015 0344   MCV 88.8 01/30/2015 0344   MCV 90.7 08/22/2014 1313   MCH 29.4 01/30/2015 0344   MCH 28.8 08/22/2014 1313   MCHC 33.1 01/30/2015 0344   MCHC 31.8 08/22/2014 1313   RDW 13.4 01/30/2015 0344    BMET    Component Value Date/Time   NA 131* 01/30/2015 0344   K 3.5 01/30/2015 0344   CL 100* 01/30/2015 0344   CO2 25 01/30/2015 0344   GLUCOSE 136* 01/30/2015 0344   BUN <5* 01/30/2015 0344   CREATININE 0.46 01/30/2015 0344   CREATININE 0.52 08/22/2014 1308   CALCIUM 8.3* 01/30/2015 0344   GFRNONAA >60 01/30/2015 0344   GFRAA >60 01/30/2015 0344    INR No results found for: INR   Intake/Output Summary (Last 24 hours) at 01/30/15 0807 Last data filed at 01/30/15 0257  Gross per 24 hour  Intake   1900 ml  Output   1075 ml  Net    825 ml     Assessment/Plan:  81 y.o. female is s/p left below knee amputation  POD 1  Will take down dressing tomorrow. Keep stump elevated.  PT/OT eval today.  Virgina Jock, PA-C [facility] Office: [phone] Pager: [phone] 01/30/2015 8:07 AM  Overall doing well Change dressing tomorrow Hopefully rehab soon Mild rest pain right foot observe for now  [name], MD [facility] [place]: [phone] Pager: [phone]

## 2015-01-30 NOTE — Evaluation (Signed)
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  Clinical Impression  Pt admitted with above diagnosis. Pt currently with functional limitations due to the deficits listed below (see PT Problem List). At the time of PT eval pt was able to perform transfers with +2 min assist for balance and support. Pt with overall decreased safety awareness but was able to SPT bed>chair fairly well. Feel this pt is a good candidate for CIR as she is motivated to participate with therapies and wishes to eventually walk with a prosthesis. Feel that with intensive rehab at the CIR level pt will be able to return home at a mod I level. Pt will benefit from skilled PT to increase their independence and safety with mobility to allow discharge to the venue listed below.       Follow Up Recommendations CIR;Supervision/Assistance - 24 hour    Equipment Recommendations  None recommended by PT    Recommendations for Other Services Rehab consult     Precautions / Restrictions Precautions Precautions: Fall Restrictions Weight Bearing Restrictions: Yes LLE Weight Bearing: Non weight bearing      Mobility  Bed Mobility Overal bed mobility: Needs Assistance Bed Mobility: Supine to Sit     Supine to sit: Min guard     General bed mobility comments: Pt able to transition to EOB well with no physical assistance. Close guard for safety as pt elevated trunk to full sitting position.   Transfers Overall transfer level: Needs assistance Equipment used: Rolling walker (2 wheeled) Transfers: Sit to/from [facility] Sit to Stand: Min assist;+2 physical assistance Stand pivot transfers: Min assist;+2 physical assistance       General transfer comment: Pt able to power-up to full standing position with +2 assist for balance and  support. Pt with poor safety awareness and was taking hands off walker to reach for objects to the side, requiring increased assist to steady and recover. Increased cueing for sequencing as pt took pivotal steps around to the recliner.   Ambulation/Gait                Stairs            Wheelchair Mobility    Modified Rankin (Stroke Patients Only)       Balance Overall balance assessment: Needs assistance Sitting-balance support: Feet supported;No upper extremity supported Sitting balance-Leahy Scale: Fair     Standing balance support: Bilateral upper extremity supported Standing balance-Leahy Scale: Poor                               Pertinent Vitals/Pain Pain Assessment: Faces Faces Pain Scale: Hurts little more Pain Location: L residual limb Pain Descriptors / Indicators: Operative site guarding Pain Intervention(s): Limited activity within patient's tolerance;Monitored during session;Repositioned    Home Living Family/patient expects to be discharged to:: Inpatient rehab Living Arrangements: Alone Available Help at Discharge: Family Type of Home: House Home Access: Stairs to enter   [employer] of Steps: 2 Home Layout: Two level;Able to live on main level with bedroom/bathroom Home Equipment: Gilford Rile - 2 wheels;Bedside commode;Hospital bed      Prior Function Level of Independence: Independent with assistive device(s)         Comments: Has been sponge bathing since April as she is scared  of falling in the shower. Uses [facility]HA in the community for assist. Pt reports she has been working at both upper and lower body strengthening prior to surgery.      Hand Dominance   Dominant Hand: Right    Extremity/Trunk Assessment   Upper Extremity Assessment: Overall WFL for tasks assessed           Lower Extremity Assessment: LLE deficits/detail   LLE Deficits / Details: Decreased strength and AROM consistent with procedure  listed above  Cervical / Trunk Assessment: Normal  Communication   Communication: No difficulties  Cognition Arousal/Alertness: Awake/alert Behavior During Therapy: WFL for tasks assessed/performed Overall Cognitive Status: Within Functional Limits for tasks assessed                      General Comments      Exercises Amputee Exercises Quad Sets: 10 reps      Assessment/Plan    PT Assessment Patient needs continued PT services  PT Diagnosis Difficulty walking;Acute pain   PT Problem List Decreased range of motion;Decreased strength;Decreased activity tolerance;Decreased balance;Decreased mobility;Decreased knowledge of use of DME;Decreased safety awareness;Decreased knowledge of precautions;Pain  PT Treatment Interventions DME instruction;Gait training;Stair training;Functional mobility training;Therapeutic activities;Therapeutic exercise;Neuromuscular re-education;Patient/family education;Wheelchair mobility training   PT Goals (Current goals can be found in the Care Plan section) Acute Rehab PT Goals Patient Stated Goal: Eventually get a prosthetic PT Goal Formulation: With patient/family Time For Goal Achievement: 02/06/15 Potential to Achieve Goals: Good    Frequency Min 4X/week   Barriers to discharge        Co-evaluation               End of Session Equipment Utilized During Treatment: Gait belt Activity Tolerance: Patient tolerated treatment well Patient left: in chair;with call bell/phone within reach;with family/visitor present Nurse Communication: Mobility status         Time: 1025-8527 PT Time Calculation (min) (ACUTE ONLY): 28 min   Charges:   PT Evaluation $Initial PT Evaluation Tier I: 1 Procedure PT Treatments $Therapeutic Activity: 8-22 mins   PT G Codes:        Rolinda Roan 02/06/15, 10:29 AM   Rolinda Roan, PT, DPT Acute Rehabilitation Services Pager: [phone]

## 2015-01-30 NOTE — Progress Notes (Signed)
UR Completed. Samantha Claxton, RN, BSN.  [phone] 

## 2015-01-30 NOTE — Evaluation (Signed)
Occupational Therapy Evaluation Patient Details Name: [name] MRN: [id number] DOB: 01-16-1932 Today's Date: 01/30/2015    History of Present Illness Pt is an 81 y.o. 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   Clinical Impression   Pt s/p above. Pt independent with ADLs, PTA. Feel pt will benefit from acute OT to increase independence prior to d/c.     Follow Up Recommendations  CIR    Equipment Recommendations  Other (comment) (defer to next venue)    Recommendations for Other Services Rehab consult     Precautions / Restrictions Precautions Precautions: Fall Restrictions Weight Bearing Restrictions: Yes LLE Weight Bearing: Non weight bearing      Mobility Bed Mobility General bed mobility comments: not assessed  Transfers Overall transfer level: Needs assistance Equipment used: Rolling walker (2 wheeled) Transfers: Sit to/from [facility] Sit to Stand: Min guard Stand pivot transfers: Min guard       General transfer comment: cues for technique.    Balance Assist for balance when standing during functional task (pulling up underwear) with use of walker.                        ADL Overall ADL's : Needs assistance/impaired     Grooming: Set up;Sitting               Lower Body Dressing: Moderate assistance;Sit to/from stand Lower Body Dressing Details (indicate cue type and reason): for balance Toilet Transfer: Min guard;Stand-pivot;RW (took steps with stand pivot; chair <> bed )           Functional mobility during ADLs: Min guard;Rolling walker (stand pivot; took some steps with stand pivot) General ADL Comments: Educated on desensitization techniques for LLE. Discussed LB dressing technique and options for LB ADLs (leaning, rolling, standing).     Vision     Perception     Praxis      Pertinent Vitals/Pain Pain Assessment: 0-10 Pain Score: 5  Faces Pain Scale: Hurts little more Pain Location:  LLE Pain Descriptors / Indicators: Throbbing Pain Intervention(s): Monitored during session;Repositioned     Hand Dominance Right   Extremity/Trunk Assessment Upper Extremity Assessment Upper Extremity Assessment: Overall WFL for tasks assessed   Lower Extremity Assessment Lower Extremity Assessment: Defer to PT evaluation LLE Deficits / Details: Decreased strength and AROM consistent with procedure listed above   Cervical / Trunk Assessment Cervical / Trunk Assessment: Normal   Communication Communication Communication: No difficulties   Cognition Arousal/Alertness: Awake/alert Behavior During Therapy: WFL for tasks assessed/performed Overall Cognitive Status: Within Functional Limits for tasks assessed                     General Comments          Shoulder Instructions      Home Living Family/patient expects to be discharged to:: Inpatient rehab Living Arrangements: Alone Available Help at Discharge: Family Type of Home: House Home Access: Stairs to enter [employer] of Steps: 2   Home Layout: Two level;Able to live on main level with bedroom/bathroom     Bathroom Shower/Tub: Teacher, early years/pre: Standard Bathroom Accessibility: Yes   Home Equipment: Walker - 2 wheels;Hospital bed (access to [facility] and shower chair)          Prior Functioning/Environment Level of Independence: Independent with assistive device(s)        Comments: Has been sponge bathing since April as she is scared  of falling in the shower. Uses HHA in the community for assist. Pt reports she has been working at both upper and lower body strengthening prior to surgery.     OT Diagnosis: Acute pain;Other (comment) (decreased balance)   OT Problem List: Decreased strength;Pain;Decreased knowledge of precautions;Decreased knowledge of use of DME or AE;Impaired balance (sitting and/or standing)   OT Treatment/Interventions: Self-care/ADL training;DME and/or  AE instruction;Therapeutic activities;Patient/family education;Balance training    OT Goals(Current goals can be found in the care plan section) Acute Rehab OT Goals Patient Stated Goal: not stated OT Goal Formulation: With patient Time For Goal Achievement: 02/06/15 Potential to Achieve Goals: Good ADL Goals Pt Will Perform Lower Body Bathing: with set-up;with supervision;sit to/from stand Pt Will Perform Lower Body Dressing: with set-up;with supervision;sit to/from stand Pt Will Transfer to Toilet: with supervision;ambulating;bedside commode Pt Will Perform Toileting - Clothing Manipulation and hygiene: with set-up;with supervision;sit to/from stand  OT Frequency: Min 2X/week   Barriers to D/C:            Co-evaluation              End of Session Equipment Utilized During Treatment: Gait belt;Rolling walker Nurse Communication: Mobility status  Activity Tolerance: Patient tolerated treatment well Patient left: in chair;with call bell/phone within reach;with family/visitor present   Time: 3382-5053 OT Time Calculation (min): 15 min Charges:  OT General Charges $OT Visit: 1 Procedure OT Evaluation $Initial OT Evaluation Tier I: 1 Procedure G-CodesBenito Mccreedy OTR/L [id number] 01/30/2015, 11:38 AM

## 2015-01-30 NOTE — H&P (Signed)
Physical Medicine and Rehabilitation Admission H&P    Chief complaint: Stump pain  HPI: [name] is a 81 y.o. right handed female with history of hypertension, peripheral vascular disease. Patient had been living alone prior to admission with assistance from son and daughter-in-law. Presented 01/29/2015 with nonhealing left foot wound. No relief with conservative care and progressive ischemic changes. 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 Oneida Alar. Hospital course pain management. Subcutaneous Lovenox for DVT prophylaxis. Physical therapy evaluation completed 01/30/2015. M.D. has requested physical medicine rehabilitation consult. Patient was admitted for a comprehensive rehabilitation program  ROS Review of Systems  Constitutional: Positive for fever. Negative for chills.  HENT: Positive for hearing loss.  Eyes: Negative for blurred vision and double vision.  Respiratory: Negative for cough and shortness of breath.  Cardiovascular: Positive for leg swelling. Negative for chest pain and palpitations.  Gastrointestinal: Positive for constipation. Negative for nausea, vomiting and abdominal pain.  Genitourinary: Negative for dysuria and hematuria.  Musculoskeletal: Positive for myalgias and joint pain.  Skin: Negative for rash.  Neurological: Negative for dizziness, tingling, seizures, loss of consciousness and headaches   Past Medical History  Diagnosis Date  . Hypertension   . PAD (peripheral artery disease)   . Arthritis   . Cancer     utertrine   Past Surgical History  Procedure Laterality Date  . Spine surgery    . Knee surgery Right 1984  . Peripheral vascular catheterization N/A 09/15/2014    Procedure: Abdominal Aortogram;  Surgeon: Elam Dutch, MD;  Location: [facility] INVASIVE CV LAB CUPID;  Service: Cardiovascular;  Laterality: N/A;  . Peripheral vascular catheterization Bilateral 09/15/2014    Procedure: Lower Extremity Angiography;  Surgeon: Elam Dutch,  MD;  Location: [facility];  Service: Cardiovascular;  Laterality: Bilateral;  . Abdominal hysterectomy      partial  . Amputation Left 01/29/2015    Procedure: Left  AMPUTATION BELOW KNEE;  Surgeon: Elam Dutch, MD;  Location: [facility] OR;  Service: Vascular;  Laterality: Left;   Family History  Problem Relation Age of Onset  . Diabetes Mother   . Heart disease Mother   . Dementia Sister   . Diabetes Son    Social History:  reports that she has never smoked. She has never used smokeless tobacco. She reports that she does not drink alcohol or use illicit drugs. Allergies: No Active Allergies Medications Prior to Admission  Medication Sig Dispense Refill  . acetaminophen-codeine (TYLENOL #3) 300-30 MG per tablet TAKE 1/2 TO 1 TABLET BY MOUTH EVERY 6 HOURS AS NEEDED (Patient not taking: Reported on 01/25/2015) 30 tablet 0  . amLODipine (NORVASC) 2.5 MG tablet Take 1 tablet (2.5 mg total) by mouth daily. (Patient not taking: Reported on 12/27/2014) 90 tablet 1  . clopidogrel (PLAVIX) 75 MG tablet Take 1 tablet (75 mg total) by mouth daily. 30 tablet 2  . hydrochlorothiazide (HYDRODIURIL) 12.5 MG tablet Take 1 tablet (12.5 mg total) by mouth daily. (Patient not taking: Reported on 01/26/2015) 30 tablet 3  . hydrochlorothiazide (MICROZIDE) 12.5 MG capsule Take 12.5 mg by mouth daily.  3  . lidocaine (LMX) 4 % cream Apply 1 application topically as needed. Use as needed for pain prior to toe procedure (Patient taking differently: Apply 1 application topically 2 (two) times daily as needed (for toe pain prior to procedure). ) 45 g 2  . neomycin-bacitracin-polymyxin (NEOSPORIN) ointment Apply 1 application topically 2 (two) times daily. Apply to toe and  foot for wound care    . neomycin-bacitracin-polymyxin (NEOSPORIN) ointment Apply 1 application topically 2 (two) times daily. apply to eye    . Polyethyl Glycol-Propyl Glycol (SYSTANE FREE OP) Place 1 drop into both eyes daily. For dry eyes     . sulfamethoxazole-trimethoprim (BACTRIM DS,SEPTRA DS) 800-160 MG per tablet Take 1 tablet by mouth 2 (two) times daily. (Patient taking differently: Take 1 tablet by mouth 2 (two) times daily. 10 day course started 01/18/15 pm) 20 tablet 0  . traMADol (ULTRAM) 50 MG tablet Take 1 or 2 every 4-6 hours as needed for pain.  Max 300 mg (6 pills) in 24 hours (Patient taking differently: Take 25 mg by mouth every 2 (two) hours. scheduled) 90 tablet 1    Home: Home Living Family/patient expects to be discharged to:: Inpatient rehab Living Arrangements: Alone Available Help at Discharge: Family Type of Home: House Home Access: Stairs to enter Technical brewer of Steps: 2 Home Layout: Two level, Able to live on main level with bedroom/bathroom Bathroom Shower/Tub: Chiropodist: Standard Bathroom Accessibility: Yes Home Equipment: Walker - 2 wheels, Hospital bed (access to [facility] and shower chair)   Functional History: Prior Function Level of Independence: Independent with assistive device(s) Comments: Has been sponge bathing since April as she is scared of falling in the shower. Uses HHA in the community for assist. Pt reports she has been working at both upper and lower body strengthening prior to surgery.   Functional Status:  Mobility: Bed Mobility Overal bed mobility: Needs Assistance Bed Mobility: Supine to Sit Supine to sit: Supervision, HOB elevated General bed mobility comments: Pt able to transition to EOB with use of bed rails and HOB elevated. Feel she would have required assistance if transferring from supine in a flat, regular bed without railings.  Transfers Overall transfer level: Needs assistance Equipment used: Rolling walker (2 wheeled) Transfers: Sit to/from Stand Sit to Stand: Min assist Stand pivot transfers: Min guard General transfer comment: Assist to steady and power-up to full standing position. Once standing, increased time to gain  maintain balance prior to initiating gait training. Ambulation/Gait Ambulation/Gait assistance: Min assist, +2 physical assistance, +2 safety/equipment Ambulation Distance (Feet): 100 Feet Assistive device: Rolling walker (2 wheeled) Gait Pattern/deviations: Step-to pattern, Decreased stride length, Trunk flexed General Gait Details: Pt was cued for general sequencing and technique with the RW. Pt required frequent cues for improved posture and to self-monitor fatigue level. Encouraged standing rest breaks. By the end of gait training pt required +2 assist for balance and support as she appeared very fatigued and unstable.  Gait velocity: Decreased Gait velocity interpretation: Below normal speed for age/gender    ADL:  ADL Overall ADL's : Needs assistance/impaired Grooming: Set up, Sitting Lower Body Dressing: Moderate assistance, Sit to/from stand Lower Body Dressing Details (indicate cue type and reason): for balance Toilet Transfer: Min guard, Stand-pivot, RW (took steps with stand pivot; chair <> bed ) Functional mobility during ADLs: Min guard, Rolling walker (stand pivot; took some steps with stand pivot) General ADL Comments: Educated on desensitization techniques for LLE. Discussed LB dressing technique and options for LB ADLs (leaning, rolling, standing).  Cognition: Cognition Overall Cognitive Status: Within Functional Limits for tasks assessed Orientation Level: Oriented X4 Cognition Arousal/Alertness: Awake/alert Behavior During Therapy: WFL for tasks assessed/performed Overall Cognitive Status: Within Functional Limits for tasks assessed  Physical Exam: Blood pressure 124/64, pulse 86, temperature 98.6 F (37 C), temperature source Oral, resp. rate 18, height 5' 4.5" (  1.638 m), weight 64.864 kg (143 lb), SpO2 98 %. Physical Exam Constitutional: She is oriented to [name], place, and time.  HENT: oral mucosa is pink and moist Head: Normocephalic.  Eyes: EOM are  normal.  Neck: Normal range of motion. Neck supple. No thyromegaly present.  Cardiovascular: Normal rate and regular rhythm. no murmurs or rubs. Respiratory: Effort normal and breath sounds normal. No respiratory distress.  GI: Soft. Bowel sounds are normal. She exhibits no distension.  Musculoskeletal:  Left BK bulbous, in ACE wrap. Able to extend knee to -5 degrees today. Appropriately tender.  Neurological: She is alert and oriented to [name], place, and time.  UE 5/5 prox to distal. RLE: 4+/5 hf,ke,adf,apf. LLE: 3/5hF, ke 1+. ?mild sensory loss to LT in right foot below ankle.  Skin:  BKA site is dressed appropriately tender. Skin intact RLE.  Psychiatric: She has a normal mood and affect. Her behavior is normal. Judgment and thought content normal   Results for orders placed or performed during the hospital encounter of 01/29/15 (from the past 48 hour(s))  Basic metabolic panel     Status: Abnormal   Collection Time: 01/30/15  3:44 AM  Result Value Ref Range   Sodium 131 (L) 135 - 145 mmol/L   Potassium 3.5 3.5 - 5.1 mmol/L   Chloride 100 (L) 101 - 111 mmol/L   CO2 25 22 - 32 mmol/L   Glucose, Bld 136 (H) 65 - 99 mg/dL   BUN <5 (L) 6 - 20 mg/dL   Creatinine, Ser 0.46 0.44 - 1.00 mg/dL   Calcium 8.3 (L) 8.9 - 10.3 mg/dL   GFR calc non Af Amer >60 >60 mL/min   GFR calc Af Amer >60 >60 mL/min    Comment: (NOTE) The eGFR has been calculated using the CKD EPI equation. This calculation has not been validated in all clinical situations. eGFR's persistently <60 mL/min signify possible Chronic Kidney Disease.    Anion gap 6 5 - 15  CBC     Status: None   Collection Time: 01/30/15  3:44 AM  Result Value Ref Range   WBC 5.9 4.0 - 10.5 K/uL   RBC 4.12 3.87 - 5.11 MIL/uL   Hemoglobin 12.1 12.0 - 15.0 g/dL   HCT 36.6 36.0 - 46.0 %   MCV 88.8 78.0 - 100.0 fL   MCH 29.4 26.0 - 34.0 pg   MCHC 33.1 30.0 - 36.0 g/dL   RDW 13.4 11.5 - 15.5 %   Platelets 224 150 - 400 K/uL  Basic  metabolic panel     Status: Abnormal   Collection Time: 01/31/15  3:31 AM  Result Value Ref Range   Sodium 130 (L) 135 - 145 mmol/L   Potassium 3.4 (L) 3.5 - 5.1 mmol/L   Chloride 97 (L) 101 - 111 mmol/L   CO2 25 22 - 32 mmol/L   Glucose, Bld 120 (H) 65 - 99 mg/dL   BUN <5 (L) 6 - 20 mg/dL   Creatinine, Ser 0.45 0.44 - 1.00 mg/dL   Calcium 8.3 (L) 8.9 - 10.3 mg/dL   GFR calc non Af Amer >60 >60 mL/min   GFR calc Af Amer >60 >60 mL/min    Comment: (NOTE) The eGFR has been calculated using the CKD EPI equation. This calculation has not been validated in all clinical situations. eGFR's persistently <60 mL/min signify possible Chronic Kidney Disease.    Anion gap 8 5 - 15  CBC     Status: None  Collection Time: 01/31/15  3:31 AM  Result Value Ref Range   WBC 6.2 4.0 - 10.5 K/uL   RBC 4.20 3.87 - 5.11 MIL/uL   Hemoglobin 12.5 12.0 - 15.0 g/dL   HCT 37.1 36.0 - 46.0 %   MCV 88.3 78.0 - 100.0 fL   MCH 29.8 26.0 - 34.0 pg   MCHC 33.7 30.0 - 36.0 g/dL   RDW 13.3 11.5 - 15.5 %   Platelets 235 150 - 400 K/uL   No results found.     Medical Problem List and Plan: 1. Functional deficits secondary to left BKA 01/29/2015 2.  DVT Prophylaxis/Anticoagulation: Subcutaneous Lovenox. Monitor platelet counts and any signs of bleeding 3. Pain Management: Tylenol No. 3 and Ultram as needed. Monitor with increased mobility  -pt is having mild phantom limb pain, but it remains tolerable at this point 4. Hypertension. Hydrochlorothiazide 12.5 mg daily. Monitor with increased mobility 5. Neuropsych: This patient is capable of making decisions on her own behalf. 6. Skin/Wound Care: Routine skin checks 7. Fluids/Electrolytes/Nutrition: Routine I&O with follow-up chemistries for tomorrow  -watch potassium 8. Prosthetics: continue ACE wrap for left stump   -prosthetic when appropriate  -consider shrinker sock depending upon appearance of the wound.      Post Admission Physician  Evaluation: 1. Functional deficits secondary  to left below knee amputation. 2. Patient is admitted to receive collaborative, interdisciplinary care between the physiatrist, rehab nursing staff, and therapy team. 3. Patient's level of medical complexity and substantial therapy needs in context of that medical necessity cannot be provided at a lesser intensity of care such as a SNF. 4. Patient has experienced substantial functional loss from his/her baseline which was documented above under the "Functional History" and "Functional Status" headings.  Judging by the patient's diagnosis, physical exam, and functional history, the patient has potential for functional progress which will result in measurable gains while on inpatient rehab.  These gains will be of substantial and practical use upon discharge  in facilitating mobility and self-care at the household level. 5. Physiatrist will provide 24 hour management of medical needs as well as oversight of the therapy plan/treatment and provide guidance as appropriate regarding the interaction of the two. 6. 24 hour rehab nursing will assist with bladder management, bowel management, safety, skin/wound care, disease management, medication administration, pain management and patient education  and help integrate therapy concepts, techniques,education, etc. 7. PT will assess and treat for/with: Lower extremity strength, range of motion, stamina, balance, functional mobility, safety, adaptive techniques and equipment, pain mgt, pre-prosthetic education.   Goals are: mod I. 8. OT will assess and treat for/with: ADL's, functional mobility, safety, upper extremity strength, adaptive techniques and equipment, pre-prosthetic education, pain mgt, ego support.   Goals are: mod I. Therapy may proceed with showering this patient if left leg is completely covered. 9. SLP will assess and treat for/with: n/a.  Goals are: n/a. 10. Case Management and Social Worker will assess  and treat for psychological issues and discharge planning. 11. Team conference will be held weekly to assess progress toward goals and to determine barriers to discharge. 12. Patient will receive at least 3 hours of therapy per day at least 5 days per week. 13. ELOS: 5-7 days       14. Prognosis:  excellent     Meredith Staggers, MD, [facility] 01/31/2015   01/31/2015

## 2015-01-30 NOTE — Consult Note (Signed)
Physical Medicine and Rehabilitation Consult Reason for Consult: Left BKA Referring Physician: Dr. Oneida Alar   HPI: [name] is a 81 y.o. right handed female with history of hypertension, peripheral vascular disease. Patient had been living alone prior to admission with assistance from son and daughter-in-law. Presented 01/29/2015 with nonhealing left foot wound. No relief with conservative care and progressive ischemic changes. 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 Oneida Alar. Hospital course pain management. Subcutaneous Lovenox for DVT prophylaxis. Physical and occupational therapy evaluations are pending. M.D. has requested physical medicine rehabilitation consult   Review of Systems  Constitutional: Positive for fever. Negative for chills.  HENT: Positive for hearing loss.   Eyes: Negative for blurred vision and double vision.  Respiratory: Negative for cough and shortness of breath.   Cardiovascular: Positive for leg swelling. Negative for chest pain and palpitations.  Gastrointestinal: Positive for constipation. Negative for nausea, vomiting and abdominal pain.  Genitourinary: Negative for dysuria and hematuria.  Musculoskeletal: Positive for myalgias and joint pain.  Skin: Negative for rash.  Neurological: Negative for dizziness, tingling, seizures, loss of consciousness and headaches.   Past Medical History  Diagnosis Date  . Hypertension   . PAD (peripheral artery disease)   . Arthritis   . Cancer     utertrine   Past Surgical History  Procedure Laterality Date  . Spine surgery    . Knee surgery Right 1984  . Peripheral vascular catheterization N/A 09/15/2014    Procedure: Abdominal Aortogram;  Surgeon: Elam Dutch, MD;  Location: [facility] INVASIVE CV LAB CUPID;  Service: Cardiovascular;  Laterality: N/A;  . Peripheral vascular catheterization Bilateral 09/15/2014    Procedure: Lower Extremity Angiography;  Surgeon: Elam Dutch, MD;  Location: [facility];  Service: Cardiovascular;  Laterality: Bilateral;  . Abdominal hysterectomy      partial   Family History  Problem Relation Age of Onset  . Diabetes Mother   . Heart disease Mother   . Dementia Sister   . Diabetes Son    Social History:  reports that she has never smoked. She has never used smokeless tobacco. She reports that she does not drink alcohol or use illicit drugs. Allergies: No Active Allergies Medications Prior to Admission  Medication Sig Dispense Refill  . hydrochlorothiazide (MICROZIDE) 12.5 MG capsule Take 12.5 mg by mouth daily.  3  . lidocaine (LMX) 4 % cream Apply 1 application topically as needed. Use as needed for pain prior to toe procedure (Patient taking differently: Apply 1 application topically 2 (two) times daily as needed (for toe pain prior to procedure). ) 45 g 2  . neomycin-bacitracin-polymyxin (NEOSPORIN) ointment Apply 1 application topically 2 (two) times daily. Apply to toe and foot for wound care    . Polyethyl Glycol-Propyl Glycol (SYSTANE FREE OP) Place 1 drop into both eyes daily. For dry eyes    . sulfamethoxazole-trimethoprim (BACTRIM DS,SEPTRA DS) 800-160 MG per tablet Take 1 tablet by mouth 2 (two) times daily. (Patient taking differently: Take 1 tablet by mouth 2 (two) times daily. 10 day course started 01/18/15 pm) 20 tablet 0  . traMADol (ULTRAM) 50 MG tablet Take 1 or 2 every 4-6 hours as needed for pain.  Max 300 mg (6 pills) in 24 hours (Patient taking differently: Take 25 mg by mouth every 2 (two) hours. scheduled) 90 tablet 1  . acetaminophen-codeine (TYLENOL #3) 300-30 MG per tablet TAKE 1/2 TO 1 TABLET BY MOUTH EVERY 6 HOURS AS  NEEDED (Patient not taking: Reported on 01/25/2015) 30 tablet 0  . amLODipine (NORVASC) 2.5 MG tablet Take 1 tablet (2.5 mg total) by mouth daily. (Patient not taking: Reported on 12/27/2014) 90 tablet 1  . clopidogrel (PLAVIX) 75 MG tablet Take 1 tablet (75 mg total) by mouth daily. 30 tablet 2  .  hydrochlorothiazide (HYDRODIURIL) 12.5 MG tablet Take 1 tablet (12.5 mg total) by mouth daily. (Patient not taking: Reported on 01/26/2015) 30 tablet 3  . neomycin-bacitracin-polymyxin (NEOSPORIN) ointment Apply 1 application topically 2 (two) times daily. apply to eye      Home: Home Living Family/patient expects to be discharged to:: Inpatient rehab Living Arrangements: Alone  Functional History:   Functional Status:  Mobility:          ADL:    Cognition: Cognition Orientation Level: Oriented to person, Oriented to place, Oriented to situation    Blood pressure 130/60, pulse 75, temperature 99.2 F (37.3 C), temperature source Oral, resp. rate 20, height 5' 4.5" (1.638 m), weight 64.864 kg (143 lb), SpO2 97 %. Physical Exam  Constitutional: She is oriented to person, place, and time.  HENT:  Head: Normocephalic.  Eyes: EOM are normal.  Neck: Normal range of motion. Neck supple. No thyromegaly present.  Cardiovascular: Normal rate and regular rhythm.   Respiratory: Effort normal and breath sounds normal. No respiratory distress.  GI: Soft. Bowel sounds are normal. She exhibits no distension.  Musculoskeletal:  Left BK bulbous, in ACE wrap. Able to extend knee to -15 degrees.  Neurological: She is alert and oriented to person, place, and time.  UE 5/5 prox to distal. RLE: 4+/5 prox to distal. LLE: 3/5hF, ke limited.  ?mild sensory loss to LT in right foot.  Skin:  BKA site is dressed appropriately tender  Psychiatric: She has a normal mood and affect. Her behavior is normal. Judgment and thought content normal.    Results for orders placed or performed during the hospital encounter of 01/29/15 (from the past 24 hour(s))  CBC     Status: Abnormal   Collection Time: 01/29/15  9:11 AM  Result Value Ref Range   WBC 3.2 (L) 4.0 - 10.5 K/uL   RBC 4.57 3.87 - 5.11 MIL/uL   Hemoglobin 13.8 12.0 - 15.0 g/dL   HCT 40.9 36.0 - 46.0 %   MCV 89.5 78.0 - 100.0 fL   MCH 30.2  26.0 - 34.0 pg   MCHC 33.7 30.0 - 36.0 g/dL   RDW 13.6 11.5 - 15.5 %   Platelets 226 150 - 400 K/uL  Basic metabolic panel     Status: None   Collection Time: 01/29/15  9:11 AM  Result Value Ref Range   Sodium 136 135 - 145 mmol/L   Potassium 4.1 3.5 - 5.1 mmol/L   Chloride 104 101 - 111 mmol/L   CO2 22 22 - 32 mmol/L   Glucose, Bld 96 65 - 99 mg/dL   BUN 9 6 - 20 mg/dL   Creatinine, Ser 0.63 0.44 - 1.00 mg/dL   Calcium 9.4 8.9 - 10.3 mg/dL   GFR calc non Af Amer >60 >60 mL/min   GFR calc Af Amer >60 >60 mL/min   Anion gap 10 5 - 15  Basic metabolic panel     Status: Abnormal   Collection Time: 01/30/15  3:44 AM  Result Value Ref Range   Sodium 131 (L) 135 - 145 mmol/L   Potassium 3.5 3.5 - 5.1 mmol/L   Chloride 100 (L)  101 - 111 mmol/L   CO2 25 22 - 32 mmol/L   Glucose, Bld 136 (H) 65 - 99 mg/dL   BUN <5 (L) 6 - 20 mg/dL   Creatinine, Ser 0.46 0.44 - 1.00 mg/dL   Calcium 8.3 (L) 8.9 - 10.3 mg/dL   GFR calc non Af Amer >60 >60 mL/min   GFR calc Af Amer >60 >60 mL/min   Anion gap 6 5 - 15  CBC     Status: None   Collection Time: 01/30/15  3:44 AM  Result Value Ref Range   WBC 5.9 4.0 - 10.5 K/uL   RBC 4.12 3.87 - 5.11 MIL/uL   Hemoglobin 12.1 12.0 - 15.0 g/dL   HCT 36.6 36.0 - 46.0 %   MCV 88.8 78.0 - 100.0 fL   MCH 29.4 26.0 - 34.0 pg   MCHC 33.1 30.0 - 36.0 g/dL   RDW 13.4 11.5 - 15.5 %   Platelets 224 150 - 400 K/uL   No results found.  Assessment/Plan: Diagnosis: functional deficits after left BKA 1. Does the need for close, 24 hr/day medical supervision in concert with the patient's rehab needs make it unreasonable for this patient to be served in a less intensive setting? Yes 2. Co-Morbidities requiring supervision/potential complications: htn, PAD, wound care, pain 3. Due to bladder management, bowel management, safety, skin/wound care, disease management, medication administration, pain management and patient education, does the patient require 24 hr/day  rehab nursing? Yes 4. Does the patient require coordinated care of a physician, rehab nurse, PT (1-2 hrs/day, 5 days/week) and OT (1-2 hrs/day, 5 days/week) to address physical and functional deficits in the context of the above medical diagnosis(es)? Yes and Potentially Addressing deficits in the following areas: balance, endurance, locomotion, strength, transferring, bowel/bladder control, bathing, dressing, feeding, grooming, toileting and psychosocial support 5. Can the patient actively participate in an intensive therapy program of at least 3 hrs of therapy per day at least 5 days per week? Yes 6. The potential for patient to make measurable gains while on inpatient rehab is excellent 7. Anticipated functional outcomes upon discharge from inpatient rehab are modified independent  with PT, modified independent with OT, n/a with SLP. 8. Estimated rehab length of stay to reach the above functional goals is: potentially 5-7 days 9. Does the patient have adequate social supports and living environment to accommodate these discharge functional goals? Yes 10. Anticipated D/C setting: Home 11. Anticipated post D/C treatments: [place] therapy 12. Overall Rehab/Functional Prognosis: excellent  RECOMMENDATIONS: This patient's condition is appropriate for continued rehabilitative care in the following setting: see below Patient has agreed to participate in recommended program. Potentially Note that insurance prior authorization may be required for reimbursement for recommended care.  Comment: Pt did well with therapy upon initial eval today, min assist +2 to transfer. She is extremely motivated and wants to be independent upon dc home. Will follow for progress. May be able to justify a brief CIR admit.  Meredith Staggers, MD, [place] 01/30/2015     01/30/2015

## 2015-01-30 NOTE — Clinical Social Work Note (Signed)
Clinical Social Work Assessment  Patient Details  Name: [name] MRN: [id number] Date of Birth: 1931/09/20  Date of referral:  01/30/15               Reason for consult:  Facility Placement                Permission sought to share information with:    Permission granted to share information::  Yes, Verbal Permission Granted  Name::     [place]::  [facility]  Relationship::  Daughter in [place]:  [phone]  Housing/Transportation Living arrangements for the past 2 months:  [place] of Information:  Patient Patient Interpreter Needed:  None Criminal Activity/Legal Involvement Pertinent to Current Situation/Hospitalization:  No - Comment as needed Significant Relationships:  Adult Children, Warehouse manager, Industrial/product designer, Other Family Members Lives with:  Self Do you feel safe going back to the place where you live?  Yes Need for family participation in patient care:  Yes (Comment) (Daily tasks )  Care giving concerns:  There is concern about mobility of the patient following amputation. The patient's son and daughter-in-law are very supportive and are active in care of patient. Family is there throughout the day, but there is concern with patient's mobility when waking up in the middle of the night (12:30-2).    Social Worker assessment / plan:  Engineer, water and CSW supervisor talked with patient about situation regarding upcoming discharge. Daughter-in-law, Vaniya Augspurger, was sitting at bedside during conversation. CSW discussed options of inpatient rehab and SNF facilities.   Employment status:  Retired Forensic scientist:  Medicare PT Recommendations:  Inpatient [place], [place] / Referral to community resources:  [place]  Patient/Family's Response to care:  Patient discussed having many supports in place. Patient has had family members stay at [facility] in the past and would be fine with staying there if she needed a SNF placement, but would also look at alternative placement options. Patient was very receptive during the conversation and was optimistic about recovery. Patient talked about having numerous supports in place. CSW will continue to follow and assist as needed. Patient did express preference for in-patient rehab, but realizes she will need some form of rehab before returning home.    Patient/Family's Understanding of and Emotional Response to Diagnosis, Current Treatment, and Prognosis:  Patient and family understand the situation at hand. Patient is optimistic and "determined" to make a fast recovery, but understands that the recovery is a process. Patient does not want to burden son with care, so she is willing to go to a SNF facility if there is no availability for in-patient rehab.  Emotional Assessment Appearance:  Appears stated age Attitude/Demeanor/Rapport:  Other ([place]) Affect (typically observed):  Accepting, Hopeful, Calm, Pleasant Orientation:  Oriented to Self, Oriented to Place, Oriented to  Time, Oriented to Situation Alcohol / Substance use:  Not Applicable Psych involvement (Current and /or in the community):  No (Comment)  Discharge Needs  Concerns to be addressed:  Care Coordination, Home Safety Concerns Readmission within the last 30 days:  No Current discharge risk:  Physical Impairment, Lives alone Barriers to Discharge:  Continued Medical Work up   [employer], Student-SW 01/30/2015, 1:45 PM   CSW has review SW intern note and agrees with assessment and plan of care.  Domenica Reamer, [place] Social Worker [phone]

## 2015-01-31 ENCOUNTER — Inpatient Hospital Stay (HOSPITAL_COMMUNITY)
Admission: AD | Admit: 2015-01-31 | Discharge: 2015-02-08 | DRG: 560 | Disposition: A | Discharge status: Home Health | Payer: Medicare Other | Source: Intra-hospital | Attending: Physical Medicine & Rehabilitation | Admitting: Physical Medicine & Rehabilitation

## 2015-01-31 DIAGNOSIS — E871 Hypo-osmolality and hyponatremia: Secondary | ICD-10-CM | POA: Diagnosis not present

## 2015-01-31 DIAGNOSIS — K5900 Constipation, unspecified: Secondary | ICD-10-CM

## 2015-01-31 DIAGNOSIS — E876 Hypokalemia: Secondary | ICD-10-CM | POA: Diagnosis not present

## 2015-01-31 DIAGNOSIS — Z4781 Encounter for orthopedic aftercare following surgical amputation: Principal | ICD-10-CM

## 2015-01-31 DIAGNOSIS — G546 Phantom limb syndrome with pain: Secondary | ICD-10-CM | POA: Diagnosis not present

## 2015-01-31 DIAGNOSIS — I739 Peripheral vascular disease, unspecified: Secondary | ICD-10-CM

## 2015-01-31 DIAGNOSIS — Z89512 Acquired absence of left leg below knee: Secondary | ICD-10-CM | POA: Diagnosis not present

## 2015-01-31 DIAGNOSIS — Z79899 Other long term (current) drug therapy: Secondary | ICD-10-CM

## 2015-01-31 DIAGNOSIS — I10 Essential (primary) hypertension: Secondary | ICD-10-CM | POA: Diagnosis not present

## 2015-01-31 DIAGNOSIS — Z7902 Long term (current) use of antithrombotics/antiplatelets: Secondary | ICD-10-CM

## 2015-01-31 DIAGNOSIS — Z8542 Personal history of malignant neoplasm of other parts of uterus: Secondary | ICD-10-CM

## 2015-01-31 DIAGNOSIS — R609 Edema, unspecified: Secondary | ICD-10-CM

## 2015-01-31 LAB — BASIC METABOLIC PANEL
ANION GAP: 8 (ref 5–15)
BUN: <5 mg/dL — ABNORMAL LOW (ref 6–20)
CALCIUM: 8.3 mg/dL — AB (ref 8.9–10.3)
CO2: 25 mmol/L (ref 22–32)
CREATININE: 0.45 mg/dL (ref 0.44–1.00)
Chloride: 97 mmol/L — ABNORMAL LOW (ref 101–111)
Glucose, Bld: 120 mg/dL — ABNORMAL HIGH (ref 65–99)
Potassium: 3.4 mmol/L — ABNORMAL LOW (ref 3.5–5.1)
SODIUM: 130 mmol/L — AB (ref 135–145)

## 2015-01-31 LAB — CBC
HCT: 37.1 % (ref 36.0–46.0)
HCT: 37.9 % (ref 36.0–46.0)
HEMOGLOBIN: 12.5 g/dL (ref 12.0–15.0)
Hemoglobin: 12.8 g/dL (ref 12.0–15.0)
MCH: 29.8 pg (ref 26.0–34.0)
MCH: 29.6 pg (ref 26.0–34.0)
MCHC: 33.7 g/dL (ref 30.0–36.0)
MCHC: 33.8 g/dL (ref 30.0–36.0)
MCV: 88.3 fL (ref 78.0–100.0)
MCV: 87.7 fL (ref 78.0–100.0)
PLATELETS: 235 10*3/uL (ref 150–400)
PLATELETS: 212 10*3/uL (ref 150–400)
RBC: 4.2 MIL/uL (ref 3.87–5.11)
RBC: 4.32 MIL/uL (ref 3.87–5.11)
RDW: 13.3 % (ref 11.5–15.5)
RDW: 13.2 % (ref 11.5–15.5)
WBC: 6.2 10*3/uL (ref 4.0–10.5)
WBC: 7.3 10*3/uL (ref 4.0–10.5)

## 2015-01-31 LAB — CREATININE, SERUM: Creatinine, Ser: 0.46 mg/dL (ref 0.44–1.00)

## 2015-01-31 MED ORDER — TRAMADOL HCL 50 MG PO TABS
25.0000 mg | ORAL_TABLET | ORAL | Status: DC | PRN
  Administered 2015-02-01 – 2015-02-05 (×6): 25 mg via ORAL
  Filled 2015-01-31 (×6): qty 1

## 2015-01-31 MED ORDER — ACETAMINOPHEN-CODEINE #3 300-30 MG PO TABS: 1.0000 | ORAL_TABLET | Freq: Four times a day (QID) | ORAL | Status: DC | PRN

## 2015-01-31 MED ORDER — PANTOPRAZOLE SODIUM 40 MG PO TBEC
40.0000 mg | DELAYED_RELEASE_TABLET | Freq: Every day | ORAL | Status: DC
  Administered 2015-02-01 – 2015-02-08 (×8): 40 mg via ORAL
  Filled 2015-01-31 (×8): qty 1

## 2015-01-31 MED ORDER — SORBITOL 70 % SOLN: 30.0000 mL | Freq: Every day | Status: DC | PRN

## 2015-01-31 MED ORDER — HYDROCHLOROTHIAZIDE 12.5 MG PO CAPS
12.5000 mg | ORAL_CAPSULE | Freq: Every day | ORAL | Status: DC
  Administered 2015-02-01 – 2015-02-08 (×8): 12.5 mg via ORAL
  Filled 2015-01-31 (×8): qty 1

## 2015-01-31 MED ORDER — ONDANSETRON HCL 4 MG PO TABS: 4.0000 mg | ORAL_TABLET | Freq: Four times a day (QID) | ORAL | Status: DC | PRN

## 2015-01-31 MED ORDER — CLOPIDOGREL BISULFATE 75 MG PO TABS
75.0000 mg | ORAL_TABLET | Freq: Every day | ORAL | Status: DC
  Administered 2015-02-01 – 2015-02-08 (×8): 75 mg via ORAL
  Filled 2015-01-31 (×7): qty 1

## 2015-01-31 MED ORDER — ONDANSETRON HCL 4 MG/2ML IJ SOLN: 4.0000 mg | Freq: Four times a day (QID) | INTRAMUSCULAR | Status: DC | PRN

## 2015-01-31 MED ORDER — PHENOL 1.4 % MT LIQD: 1.0000 | OROMUCOSAL | Status: DC | PRN

## 2015-01-31 MED ORDER — ACETAMINOPHEN 650 MG RE SUPP: 325.0000 mg | RECTAL | Status: DC | PRN

## 2015-01-31 MED ORDER — DOCUSATE SODIUM 100 MG PO CAPS
100.0000 mg | ORAL_CAPSULE | Freq: Every day | ORAL | Status: DC
  Administered 2015-02-01 – 2015-02-08 (×8): 100 mg via ORAL
  Filled 2015-01-31 (×7): qty 1

## 2015-01-31 MED ORDER — BACITRACIN-NEOMYCIN-POLYMYXIN OINTMENT TUBE
TOPICAL_OINTMENT | Freq: Two times a day (BID) | CUTANEOUS | Status: DC
  Administered 2015-01-31: 1 via TOPICAL
  Filled 2015-01-31: qty 15

## 2015-01-31 MED ORDER — ENOXAPARIN SODIUM 40 MG/0.4ML ~~LOC~~ SOLN: 40.0000 mg | SUBCUTANEOUS | Status: DC

## 2015-01-31 MED ORDER — ACETAMINOPHEN 325 MG PO TABS
325.0000 mg | ORAL_TABLET | ORAL | Status: DC | PRN
  Administered 2015-01-31 – 2015-02-02 (×3): 650 mg via ORAL
  Filled 2015-01-31 (×5): qty 2

## 2015-01-31 MED ORDER — ENOXAPARIN SODIUM 40 MG/0.4ML ~~LOC~~ SOLN
40.0000 mg | SUBCUTANEOUS | Status: DC
  Administered 2015-01-31 – 2015-02-07 (×8): 40 mg via SUBCUTANEOUS
  Filled 2015-01-31 (×8): qty 0.4

## 2015-01-31 NOTE — Progress Notes (Signed)
Meredith Staggers, MD Physician Signed Physical Medicine and Rehabilitation Consult Note 01/30/2015 5:53 AM  Related encounter: Admission (Current) from 01/29/2015 in [place] [facility]        Physical Medicine and Rehabilitation Consult Reason for Consult: Left BKA Referring Physician: Dr. Oneida Alar   HPI: [name] is a 81 y.o. right handed female with history of hypertension, peripheral vascular disease. Patient had been living alone prior to admission with assistance from son and daughter-in-law. Presented 01/29/2015 with nonhealing left foot wound. No relief with conservative care and progressive ischemic changes. 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 Oneida Alar. Hospital course pain management. Subcutaneous Lovenox for DVT prophylaxis. Physical and occupational therapy evaluations are pending. M.D. has requested physical medicine rehabilitation consult   Review of Systems  Constitutional: Positive for fever. Negative for chills.  HENT: Positive for hearing loss.  Eyes: Negative for blurred vision and double vision.  Respiratory: Negative for cough and shortness of breath.  Cardiovascular: Positive for leg swelling. Negative for chest pain and palpitations.  Gastrointestinal: Positive for constipation. Negative for nausea, vomiting and abdominal pain.  Genitourinary: Negative for dysuria and hematuria.  Musculoskeletal: Positive for myalgias and joint pain.  Skin: Negative for rash.  Neurological: Negative for dizziness, tingling, seizures, loss of consciousness and headaches.   Past Medical History  Diagnosis Date  . Hypertension   . PAD (peripheral artery disease)   . Arthritis   . Cancer     utertrine   Past Surgical History  Procedure Laterality Date  . Spine surgery    . Knee surgery Right 1984  . Peripheral vascular catheterization N/A 09/15/2014    Procedure: Abdominal  Aortogram; Surgeon: Elam Dutch, MD; Location: [facility] INVASIVE CV LAB CUPID; Service: Cardiovascular; Laterality: N/A;  . Peripheral vascular catheterization Bilateral 09/15/2014    Procedure: Lower Extremity Angiography; Surgeon: Elam Dutch, MD; Location: [place]; Service: Cardiovascular; Laterality: Bilateral;  . Abdominal hysterectomy      partial   Family History  Problem Relation Age of Onset  . Diabetes Mother   . Heart disease Mother   . Dementia Sister   . Diabetes Son    Social History:  reports that she has never smoked. She has never used smokeless tobacco. She reports that she does not drink alcohol or use illicit drugs. Allergies: No Active Allergies Medications Prior to Admission  Medication Sig Dispense Refill  . hydrochlorothiazide (MICROZIDE) 12.5 MG capsule Take 12.5 mg by mouth daily.  3  . lidocaine (LMX) 4 % cream Apply 1 application topically as needed. Use as needed for pain prior to toe procedure (Patient taking differently: Apply 1 application topically 2 (two) times daily as needed (for toe pain prior to procedure). ) 45 g 2  . neomycin-bacitracin-polymyxin (NEOSPORIN) ointment Apply 1 application topically 2 (two) times daily. Apply to toe and foot for wound care    . Polyethyl Glycol-Propyl Glycol (SYSTANE FREE OP) Place 1 drop into both eyes daily. For dry eyes    . sulfamethoxazole-trimethoprim (BACTRIM DS,SEPTRA DS) 800-160 MG per tablet Take 1 tablet by mouth 2 (two) times daily. (Patient taking differently: Take 1 tablet by mouth 2 (two) times daily. 10 day course started 01/18/15 pm) 20 tablet 0  . traMADol (ULTRAM) 50 MG tablet Take 1 or 2 every 4-6 hours as needed for pain. Max 300 mg (6 pills) in 24 hours (Patient taking differently: Take 25 mg by  mouth every 2 (two) hours. scheduled) 90 tablet 1  . acetaminophen-codeine (TYLENOL #3) 300-30 MG per tablet TAKE  1/2 TO 1 TABLET BY MOUTH EVERY 6 HOURS AS NEEDED (Patient not taking: Reported on 01/25/2015) 30 tablet 0  . amLODipine (NORVASC) 2.5 MG tablet Take 1 tablet (2.5 mg total) by mouth daily. (Patient not taking: Reported on 12/27/2014) 90 tablet 1  . clopidogrel (PLAVIX) 75 MG tablet Take 1 tablet (75 mg total) by mouth daily. 30 tablet 2  . hydrochlorothiazide (HYDRODIURIL) 12.5 MG tablet Take 1 tablet (12.5 mg total) by mouth daily. (Patient not taking: Reported on 01/26/2015) 30 tablet 3  . neomycin-bacitracin-polymyxin (NEOSPORIN) ointment Apply 1 application topically 2 (two) times daily. apply to eye      Home: Home Living Family/patient expects to be discharged to:: Inpatient rehab Living Arrangements: Alone  Functional History:   Functional Status:  Mobility:          ADL:    Cognition: Cognition Orientation Level: Oriented to person, Oriented to place, Oriented to situation    Blood pressure 130/60, pulse 75, temperature 99.2 F (37.3 C), temperature source Oral, resp. rate 20, height 5' 4.5" (1.638 m), weight 64.864 kg (143 lb), SpO2 97 %. Physical Exam  Constitutional: She is oriented to person, place, and time.  HENT:  Head: Normocephalic.  Eyes: EOM are normal.  Neck: Normal range of motion. Neck supple. No thyromegaly present.  Cardiovascular: Normal rate and regular rhythm.  Respiratory: Effort normal and breath sounds normal. No respiratory distress.  GI: Soft. Bowel sounds are normal. She exhibits no distension.  Musculoskeletal:  Left BK bulbous, in ACE wrap. Able to extend knee to -15 degrees.  Neurological: She is alert and oriented to person, place, and time.  UE 5/5 prox to distal. RLE: 4+/5 prox to distal. LLE: 3/5hF, ke limited. ?mild sensory loss to LT in right foot.  Skin:  BKA site is dressed appropriately tender  Psychiatric: She has a normal mood and affect. Her behavior is normal. Judgment and thought content normal.      Lab Results Last 24 Hours    Results for orders placed or performed during the hospital encounter of 01/29/15 (from the past 24 hour(s))  CBC Status: Abnormal   Collection Time: 01/29/15 9:11 AM  Result Value Ref Range   WBC 3.2 (L) 4.0 - 10.5 K/uL   RBC 4.57 3.87 - 5.11 MIL/uL   Hemoglobin 13.8 12.0 - 15.0 g/dL   HCT 40.9 36.0 - 46.0 %   MCV 89.5 78.0 - 100.0 fL   MCH 30.2 26.0 - 34.0 pg   MCHC 33.7 30.0 - 36.0 g/dL   RDW 13.6 11.5 - 15.5 %   Platelets 226 150 - 400 K/uL  Basic metabolic panel Status: None   Collection Time: 01/29/15 9:11 AM  Result Value Ref Range   Sodium 136 135 - 145 mmol/L   Potassium 4.1 3.5 - 5.1 mmol/L   Chloride 104 101 - 111 mmol/L   CO2 22 22 - 32 mmol/L   Glucose, Bld 96 65 - 99 mg/dL   BUN 9 6 - 20 mg/dL   Creatinine, Ser 0.63 0.44 - 1.00 mg/dL   Calcium 9.4 8.9 - 10.3 mg/dL   GFR calc non Af Amer >60 >60 mL/min   GFR calc Af Amer >60 >60 mL/min   Anion gap 10 5 - 15  Basic metabolic panel Status: Abnormal   Collection Time: 01/30/15 3:44 AM  Result Value Ref Range  Sodium 131 (L) 135 - 145 mmol/L   Potassium 3.5 3.5 - 5.1 mmol/L   Chloride 100 (L) 101 - 111 mmol/L   CO2 25 22 - 32 mmol/L   Glucose, Bld 136 (H) 65 - 99 mg/dL   BUN <5 (L) 6 - 20 mg/dL   Creatinine, Ser 0.46 0.44 - 1.00 mg/dL   Calcium 8.3 (L) 8.9 - 10.3 mg/dL   GFR calc non Af Amer >60 >60 mL/min   GFR calc Af Amer >60 >60 mL/min   Anion gap 6 5 - 15  CBC Status: None   Collection Time: 01/30/15 3:44 AM  Result Value Ref Range   WBC 5.9 4.0 - 10.5 K/uL   RBC 4.12 3.87 - 5.11 MIL/uL   Hemoglobin 12.1 12.0 - 15.0 g/dL   HCT 36.6 36.0 - 46.0 %   MCV 88.8 78.0 - 100.0 fL   MCH 29.4 26.0 - 34.0 pg   MCHC 33.1 30.0 - 36.0 g/dL   RDW 13.4 11.5 - 15.5 %   Platelets  224 150 - 400 K/uL      Imaging Results (Last 48 hours)    No results found.    Assessment/Plan: Diagnosis: functional deficits after left BKA 1. Does the need for close, 24 hr/day medical supervision in concert with the patient's rehab needs make it unreasonable for this patient to be served in a less intensive setting? Yes 2. Co-Morbidities requiring supervision/potential complications: htn, PAD, wound care, pain 3. Due to bladder management, bowel management, safety, skin/wound care, disease management, medication administration, pain management and patient education, does the patient require 24 hr/day rehab nursing? Yes 4. Does the patient require coordinated care of a physician, rehab nurse, PT (1-2 hrs/day, 5 days/week) and OT (1-2 hrs/day, 5 days/week) to address physical and functional deficits in the context of the above medical diagnosis(es)? Yes and Potentially Addressing deficits in the following areas: balance, endurance, locomotion, strength, transferring, bowel/bladder control, bathing, dressing, feeding, grooming, toileting and psychosocial support 5. Can the patient actively participate in an intensive therapy program of at least 3 hrs of therapy per day at least 5 days per week? Yes 6. The potential for patient to make measurable gains while on inpatient rehab is excellent 7. Anticipated functional outcomes upon discharge from inpatient rehab are modified independent with PT, modified independent with OT, n/a with SLP. 8. Estimated rehab length of stay to reach the above functional goals is: potentially 5-7 days 9. Does the patient have adequate social supports and living environment to accommodate these discharge functional goals? Yes 10. Anticipated D/C setting: Home 11. Anticipated post D/C treatments: [place] therapy 12. Overall Rehab/Functional Prognosis: excellent  RECOMMENDATIONS: This patient's condition is appropriate for continued rehabilitative care in the following  setting: see below Patient has agreed to participate in recommended program. Potentially Note that insurance prior authorization may be required for reimbursement for recommended care.  Comment: Pt did well with therapy upon initial eval today, min assist +2 to transfer. She is extremely motivated and wants to be independent upon dc home. Will follow for progress. May be able to justify a brief CIR admit.  Meredith Staggers, MD, [place] 01/30/2015     01/30/2015       Revision History     Date/Time User Provider Type Action   01/30/2015 9:40 AM Meredith Staggers, MD Physician Sign   01/30/2015 6:23 AM Cathlyn Parsons, PA-C Physician Assistant [place]   View Details Report  Routing History     Date/Time From To Method   01/30/2015 9:40 AM Meredith Staggers, MD Meredith Staggers, MD In Basket   01/30/2015 9:40 AM Meredith Staggers, MD [name], MD In Basket

## 2015-01-31 NOTE — Progress Notes (Signed)
Gail Diones, RN Rehab Admission Coordinator Signed Physical Medicine and Rehabilitation PMR Pre-admission 01/31/2015 11:08 AM  Related encounter: Admission (Current) from 01/29/2015 in [place] Collapse All   PMR Admission Coordinator Pre-Admission Assessment  Patient: [name] is an 81 y.o., female MRN: [id number] DOB: 1931/07/07 Height: 5' 4.5" (163.8 cm) Weight: 64.864 kg (143 lb)  Insurance Information HMO: PPO: PCP: IPA: 80/20: OTHER:  PRIMARY: Medicare A/B Policy#: [id number] Subscriber: Leitha Bleak CM Name: Phone#: Fax#:  Pre-Cert#: Employer: Retired Benefits: Phone #: Name: Checked in [place]. Date: 11/10/98 Deduct: $1288 Out of Pocket Max: none Life Max: unlimited CIR: 100% SNF: 100 days Outpatient: 80% Co-Pay: 20% Home Health: 100% Co-Pay: none DME: 80% Co-Pay: 20% Providers: patient's choice  SECONDARY: UHC Policy#: [id number] Subscriber: Leitha Bleak CM Name: Phone#: Fax#:  Pre-Cert#: Employer: Retired Benefits: Phone #: [phone] Name:  Eff. Date: Deduct: Out of Pocket Max: Life Max:  CIR: SNF:  Outpatient: Co-Pay:  Home Health: Co-Pay:  DME: Co-Pay:   Emergency Contact Information Contact Information    Name Relation Home Work Mobile   [place] Son   [phone]   Mindi, Akerson Relative [phone]  [phone]     Current Medical History  Patient Admitting Diagnosis: L BKA  History of Present Illness: AN 81 y.o. right handed female with history of hypertension, peripheral vascular disease. Patient had been living  alone prior to admission with assistance from son and daughter-in-law. Presented 01/29/2015 with nonhealing left foot wound. No relief with conservative care and progressive ischemic changes. 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 Oneida Alar. Hospital course pain management. Subcutaneous Lovenox for DVT prophylaxis. Physical and occupational therapy evaluations completed with recommendations for inpatient rehab. M.D. has requested physical medicine rehabilitation consult.   Past Medical History  Past Medical History  Diagnosis Date  . Hypertension   . PAD (peripheral artery disease)   . Arthritis   . Cancer     utertrine    Family History  family history includes Dementia in her sister; Diabetes in her mother and son; Heart disease in her mother.  Prior Rehab/Hospitalizations:  Has the patient had major surgery during 100 days prior to admission? No  Current Medications   Current facility-administered medications:  . 0.45 % sodium chloride infusion, , Intravenous, Continuous, Alvia Grove, PA-C, Stopped at 01/30/15 [phone] . acetaminophen (TYLENOL) tablet 325-650 mg, 325-650 mg, Oral, Q4H PRN **OR** acetaminophen (TYLENOL) suppository 325-650 mg, 325-650 mg, Rectal, Q4H PRN, Alvia Grove, PA-C . acetaminophen-codeine (TYLENOL #3) 300-30 MG per tablet 1 tablet, 1 tablet, Oral, Q6H PRN, Alvia Grove, PA-C, 1 tablet at 01/29/15 1501 . alum & mag hydroxide-simeth (MAALOX/MYLANTA) 200-200-20 MG/5ML suspension 15-30 mL, 15-30 mL, Oral, Q2H PRN, Alvia Grove, PA-C . clopidogrel (PLAVIX) tablet 75 mg, 75 mg, Oral, Daily, Janalyn Harder Trinh, PA-C, 75 mg at 01/30/15 1040 . docusate sodium (COLACE) capsule 100 mg, 100 mg, Oral, Daily, Kimberly A Trinh, PA-C, 100 mg at 01/30/15 1040 . enoxaparin (LOVENOX) injection 40 mg, 40 mg, Subcutaneous, Q24H, Kimberly A Trinh, PA-C, 40 mg at 01/31/15 0754 . guaiFENesin-dextromethorphan (ROBITUSSIN DM) 100-10 MG/5ML syrup 15  mL, 15 mL, Oral, Q4H PRN, Alvia Grove, PA-C . hydrALAZINE (APRESOLINE) injection 5 mg, 5 mg, Intravenous, Q20 Min PRN, Alvia Grove, PA-C . hydrochlorothiazide (MICROZIDE) capsule 12.5 mg, 12.5 mg, Oral, Daily, Kimberly A Trinh, PA-C, 12.5 mg at 01/30/15 1040 . Influenza vac split quadrivalent PF (FLUARIX) injection 0.5 mL,  0.5 mL, Intramuscular, Tomorrow-1000, Elam Dutch, MD . labetalol (NORMODYNE,TRANDATE) injection 10 mg, 10 mg, Intravenous, Q10 min PRN, Alvia Grove, PA-C . magnesium sulfate IVPB 2 g 50 mL, 2 g, Intravenous, Daily PRN, Alvia Grove, PA-C . metoprolol (LOPRESSOR) injection 2-5 mg, 2-5 mg, Intravenous, Q2H PRN, Alvia Grove, PA-C . morphine 2 MG/ML injection 1-4 mg, 1-4 mg, Intravenous, Q1H PRN, Alvia Grove, PA-C, 4 mg at 01/30/15 0551 . neomycin-bacitracin-polymyxin (NEOSPORIN) ointment, , Topical, BID, Elam Dutch, MD . ondansetron [facility]) injection 4 mg, 4 mg, Intravenous, Q6H PRN, Alvia Grove, PA-C . pantoprazole (PROTONIX) EC tablet 40 mg, 40 mg, Oral, Daily, Janalyn Harder Trinh, PA-C, 40 mg at 01/30/15 1040 . phenol (CHLORASEPTIC) mouth spray 1 spray, 1 spray, Mouth/Throat, PRN, Alvia Grove, PA-C . potassium chloride SA (K-DUR,KLOR-CON) CR tablet 20-40 mEq, 20-40 mEq, Oral, Daily PRN, Alvia Grove, PA-C . traMADol (ULTRAM) tablet 25 mg, 25 mg, Oral, Q2H PRN, Elam Dutch, MD, 25 mg at 01/31/15 0144  Patients Current Diet: Diet regular Room service appropriate?: Yes; Fluid consistency:: Thin  Precautions / Restrictions Precautions Precautions: Fall Restrictions Weight Bearing Restrictions: Yes LLE Weight Bearing: Non weight bearing   Has the patient had 2 or more falls or a fall with injury in the past year?No  Prior Activity Level Community (5-7x/wk): Went out daily. Walked and exercised. Has not been driving for the past 6 months.  Home Assistive Devices / Equipment Home Assistive  Devices/Equipment: Eyeglasses Home Equipment: [place] 2 wheels, Hospital bed (access to [facility] and shower chair)  Prior Device Use: Indicate devices/aids used by the patient prior to current illness, exacerbation or injury? None. However, holds onto arm of dtr-in-law outside on unlevel walking surfaces.  Prior Functional Level Prior Function Level of Independence: Independent with assistive device(s) Comments: Has been sponge bathing since April as she is scared of falling in the shower. Uses HHA in the community for assist. Pt reports she has been working at both upper and lower body strengthening prior to surgery.   Self Care: Did the patient need help bathing, dressing, using the toilet or eating? Independent  Indoor Mobility: Did the patient need assistance with walking from room to room (with or without device)? Independent  Stairs: Did the patient need assistance with internal or external stairs (with or without device)? Independent  Functional Cognition: Did the patient need help planning regular tasks such as shopping or remembering to take medications? Independent  Current Functional Level Cognition  Overall Cognitive Status: Within Functional Limits for tasks assessed Orientation Level: Oriented X4   Extremity Assessment (includes Sensation/Coordination)  Upper Extremity Assessment: Overall WFL for tasks assessed  Lower Extremity Assessment: Defer to PT evaluation LLE Deficits / Details: Decreased strength and AROM consistent with procedure listed above    ADLs  Overall ADL's : Needs assistance/impaired Grooming: Set up, Sitting Lower Body Dressing: Moderate assistance, Sit to/from stand Lower Body Dressing Details (indicate cue type and reason): for balance Toilet Transfer: Min guard, Stand-pivot, RW (took steps with stand pivot; chair <> bed ) Functional mobility during ADLs: Min guard, Rolling walker (stand pivot; took some steps with stand pivot) General ADL  Comments: Educated on desensitization techniques for LLE. Discussed LB dressing technique and options for LB ADLs (leaning, rolling, standing).    Mobility  Overal bed mobility: Needs Assistance Bed Mobility: Supine to Sit Supine to sit: Supervision, HOB elevated General bed mobility comments: Pt able to transition to EOB with use  of bed rails and HOB elevated. Feel she would have required assistance if transferring from supine in a flat, regular bed without railings.     Transfers  Overall transfer level: Needs assistance Equipment used: Rolling walker (2 wheeled) Transfers: Sit to/from Stand Sit to Stand: Min assist Stand pivot transfers: Min guard General transfer comment: Assist to steady and power-up to full standing position. Once standing, increased time to gain maintain balance prior to initiating gait training.    Ambulation / Gait / Stairs / Wheelchair Mobility  Ambulation/Gait Ambulation/Gait assistance: Min assist, +2 physical assistance, +2 safety/equipment Ambulation Distance (Feet): 100 Feet Assistive device: Rolling walker (2 wheeled) Gait Pattern/deviations: Step-to pattern, Decreased stride length, Trunk flexed General Gait Details: Pt was cued for general sequencing and technique with the RW. Pt required frequent cues for improved posture and to self-monitor fatigue level. Encouraged standing rest breaks. By the end of gait training pt required +2 assist for balance and support as she appeared very fatigued and unstable.  Gait velocity: Decreased Gait velocity interpretation: Below normal speed for age/gender    Posture / Balance Balance Overall balance assessment: Needs assistance Sitting-balance support: Feet supported, No upper extremity supported Sitting balance-Leahy Scale: Fair Standing balance support: Bilateral upper extremity supported, During functional activity Standing balance-Leahy Scale: Poor    Special needs/care consideration  BiPAP/CPAP No CPM No Continuous Drip IV No Dialysis No  Life Vest No Oxygen No Special Bed No Trach Size No Wound Vac (area) No  Skin Has dry skin. Has a pressure dressing on L BKA stump incision.  Bowel mgmt: Last BM 01/29/15 prior to admission Bladder mgmt: Voiding on bedpan and on BSC Diabetic mgmt No    Previous Home Environment Living Arrangements: Alone Available Help at Discharge: Family Type of Home: House Home Layout: Two level, Able to live on main level with bedroom/bathroom Home Access: Stairs to enter [employer] of Steps: 2 Bathroom Shower/Tub: Optometrist: Yes [place]: No  Discharge Living Setting Plans for Discharge Living Setting: Patient's home, Alone, House (Lives alone, but son can stay at night with patient.) Type of Home at Discharge: House Discharge Home Layout: Two level, Able to live on main level with bedroom/bathroom Alternate Level Stairs-Number of Steps: 14 Discharge Home Access: Stairs to enter Entrance Stairs-Number of Steps: 2 steps to porch and 1 step into kitchen from [employer] Does the patient have any problems obtaining your medications?: No  Social/Family/Support Systems Patient Roles: Parent (Has a son and a dtr-in-law.) Contact Information: Delbra Zellars and Carren Rang - son and dtr-in-law Anticipated Caregiver: self and family Anticipated Caregiver's Contact Information: Jearldean Gutt - dtr-in-law (c) [phone] Ability/Limitations of Caregiver: Dtr in law stays with patient daily Caregiver Availability: Intermittent Discharge Plan Discussed with Primary Caregiver: Yes Is Caregiver In Agreement with Plan?: Yes Does Caregiver/Family have Issues with Lodging/Transportation while Pt is in Rehab?: No  Goals/Additional Needs Patient/Family Goal for Rehab: PT/OT mod I goals Expected length of stay: 5-7  days Cultural Considerations: None Dietary Needs: Regular diet with thin liquids Equipment Needs: TBD Pt/Family Agrees to Admission and willing to participate: Yes Program Orientation Provided & Reviewed with Pt/Caregiver Including Roles & Responsibilities: Yes  Decrease burden of Care through IP rehab admission: N/A  Possible need for SNF placement upon discharge: Not anticipated  Patient Condition: This patient's condition remains as documented in the consult dated 01/30/15, in which the Rehabilitation Physician determined and documented that the patient's condition is appropriate for  intensive rehabilitative care in an inpatient rehabilitation facility. Will admit to inpatient rehab today.  Preadmission Screen Completed By: [name], 01/31/2015 11:19 AM ______________________________________________________________________  Discussed status with Dr. Naaman Plummer on 01/31/15 at 1119 and received telephone approval for admission today.  Admission Coordinator: [name], time1119/Date09/21/16          Cosigned by: Meredith Staggers, MD at 01/31/2015 11:35 AM  Revision History     Date/Time User Provider Type Action   01/31/2015 11:35 AM Meredith Staggers, MD Physician Cosign   01/31/2015 11:20 AM Gail Diones, RN Rehab Admission Coordinator Sign

## 2015-01-31 NOTE — Care Management Note (Signed)
Case Management Note  Patient Details  Name: AFUA HOOTS MRN: [id number] Date of Birth: 08-01-1931  Subjective/Objective:  Pt admitted with non healing wound of lower extremity.  Pt has below the knee amputation while admitted                  Action/Plan:  Pt is independent from home with daughter as support system.  Pt will discharge to CIR 01/31/15   Expected Discharge Date:                  Expected Discharge Plan:  [place]  In-House Referral:     Discharge planning Services  CM Consult  Post Acute Care Choice:    Choice offered to:     DME Arranged:    DME Agency:     HH Arranged:    [place] Agency:     Status of Service:  Completed, signed off  Medicare Important Message Given:    Date Medicare IM Given:    Medicare IM give by:    Date Additional Medicare IM Given:    Additional Medicare Important Message give by:     If discussed at [place] of Stay Meetings, dates discussed:    Additional Comments:  Maryclare Labrador, RN 01/31/2015, 2:27 PM

## 2015-01-31 NOTE — H&P (View-Only) (Signed)
Physical Medicine and Rehabilitation Admission H&P    Chief complaint: Stump pain  HPI: [name] is a 81 y.o. right handed female with history of hypertension, peripheral vascular disease. Patient had been living alone prior to admission with assistance from son and daughter-in-law. Presented 01/29/2015 with nonhealing left foot wound. No relief with conservative care and progressive ischemic changes. 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 Oneida Alar. Hospital course pain management. Subcutaneous Lovenox for DVT prophylaxis. Physical therapy evaluation completed 01/30/2015. M.D. has requested physical medicine rehabilitation consult. Patient was admitted for a comprehensive rehabilitation program  ROS Review of Systems  Constitutional: Positive for fever. Negative for chills.  HENT: Positive for hearing loss.  Eyes: Negative for blurred vision and double vision.  Respiratory: Negative for cough and shortness of breath.  Cardiovascular: Positive for leg swelling. Negative for chest pain and palpitations.  Gastrointestinal: Positive for constipation. Negative for nausea, vomiting and abdominal pain.  Genitourinary: Negative for dysuria and hematuria.  Musculoskeletal: Positive for myalgias and joint pain.  Skin: Negative for rash.  Neurological: Negative for dizziness, tingling, seizures, loss of consciousness and headaches   Past Medical History  Diagnosis Date  . Hypertension   . PAD (peripheral artery disease)   . Arthritis   . Cancer     utertrine   Past Surgical History  Procedure Laterality Date  . Spine surgery    . Knee surgery Right 1984  . Peripheral vascular catheterization N/A 09/15/2014    Procedure: Abdominal Aortogram;  Surgeon: Elam Dutch, MD;  Location: [facility] INVASIVE CV LAB CUPID;  Service: Cardiovascular;  Laterality: N/A;  . Peripheral vascular catheterization Bilateral 09/15/2014    Procedure: Lower Extremity Angiography;  Surgeon: Elam Dutch,  MD;  Location: [facility];  Service: Cardiovascular;  Laterality: Bilateral;  . Abdominal hysterectomy      partial  . Amputation Left 01/29/2015    Procedure: Left  AMPUTATION BELOW KNEE;  Surgeon: Elam Dutch, MD;  Location: [facility] OR;  Service: Vascular;  Laterality: Left;   Family History  Problem Relation Age of Onset  . Diabetes Mother   . Heart disease Mother   . Dementia Sister   . Diabetes Son    Social History:  reports that she has never smoked. She has never used smokeless tobacco. She reports that she does not drink alcohol or use illicit drugs. Allergies: No Active Allergies Medications Prior to Admission  Medication Sig Dispense Refill  . acetaminophen-codeine (TYLENOL #3) 300-30 MG per tablet TAKE 1/2 TO 1 TABLET BY MOUTH EVERY 6 HOURS AS NEEDED (Patient not taking: Reported on 01/25/2015) 30 tablet 0  . amLODipine (NORVASC) 2.5 MG tablet Take 1 tablet (2.5 mg total) by mouth daily. (Patient not taking: Reported on 12/27/2014) 90 tablet 1  . clopidogrel (PLAVIX) 75 MG tablet Take 1 tablet (75 mg total) by mouth daily. 30 tablet 2  . hydrochlorothiazide (HYDRODIURIL) 12.5 MG tablet Take 1 tablet (12.5 mg total) by mouth daily. (Patient not taking: Reported on 01/26/2015) 30 tablet 3  . hydrochlorothiazide (MICROZIDE) 12.5 MG capsule Take 12.5 mg by mouth daily.  3  . lidocaine (LMX) 4 % cream Apply 1 application topically as needed. Use as needed for pain prior to toe procedure (Patient taking differently: Apply 1 application topically 2 (two) times daily as needed (for toe pain prior to procedure). ) 45 g 2  . neomycin-bacitracin-polymyxin (NEOSPORIN) ointment Apply 1 application topically 2 (two) times daily. Apply to toe and  foot for wound care    . neomycin-bacitracin-polymyxin (NEOSPORIN) ointment Apply 1 application topically 2 (two) times daily. apply to eye    . Polyethyl Glycol-Propyl Glycol (SYSTANE FREE OP) Place 1 drop into both eyes daily. For dry eyes     . sulfamethoxazole-trimethoprim (BACTRIM DS,SEPTRA DS) 800-160 MG per tablet Take 1 tablet by mouth 2 (two) times daily. (Patient taking differently: Take 1 tablet by mouth 2 (two) times daily. 10 day course started 01/18/15 pm) 20 tablet 0  . traMADol (ULTRAM) 50 MG tablet Take 1 or 2 every 4-6 hours as needed for pain.  Max 300 mg (6 pills) in 24 hours (Patient taking differently: Take 25 mg by mouth every 2 (two) hours. scheduled) 90 tablet 1    Home: Home Living Family/patient expects to be discharged to:: Inpatient rehab Living Arrangements: Alone Available Help at Discharge: Family Type of Home: House Home Access: Stairs to enter Technical brewer of Steps: 2 Home Layout: Two level, Able to live on main level with bedroom/bathroom Bathroom Shower/Tub: Chiropodist: Standard Bathroom Accessibility: Yes Home Equipment: Walker - 2 wheels, Hospital bed (access to [facility] and shower chair)   Functional History: Prior Function Level of Independence: Independent with assistive device(s) Comments: Has been sponge bathing since April as she is scared of falling in the shower. Uses HHA in the community for assist. Pt reports she has been working at both upper and lower body strengthening prior to surgery.   Functional Status:  Mobility: Bed Mobility Overal bed mobility: Needs Assistance Bed Mobility: Supine to Sit Supine to sit: Supervision, HOB elevated General bed mobility comments: Pt able to transition to EOB with use of bed rails and HOB elevated. Feel she would have required assistance if transferring from supine in a flat, regular bed without railings.  Transfers Overall transfer level: Needs assistance Equipment used: Rolling walker (2 wheeled) Transfers: Sit to/from Stand Sit to Stand: Min assist Stand pivot transfers: Min guard General transfer comment: Assist to steady and power-up to full standing position. Once standing, increased time to gain  maintain balance prior to initiating gait training. Ambulation/Gait Ambulation/Gait assistance: Min assist, +2 physical assistance, +2 safety/equipment Ambulation Distance (Feet): 100 Feet Assistive device: Rolling walker (2 wheeled) Gait Pattern/deviations: Step-to pattern, Decreased stride length, Trunk flexed General Gait Details: Pt was cued for general sequencing and technique with the RW. Pt required frequent cues for improved posture and to self-monitor fatigue level. Encouraged standing rest breaks. By the end of gait training pt required +2 assist for balance and support as she appeared very fatigued and unstable.  Gait velocity: Decreased Gait velocity interpretation: Below normal speed for age/gender    ADL:  ADL Overall ADL's : Needs assistance/impaired Grooming: Set up, Sitting Lower Body Dressing: Moderate assistance, Sit to/from stand Lower Body Dressing Details (indicate cue type and reason): for balance Toilet Transfer: Min guard, Stand-pivot, RW (took steps with stand pivot; chair <> bed ) Functional mobility during ADLs: Min guard, Rolling walker (stand pivot; took some steps with stand pivot) General ADL Comments: Educated on desensitization techniques for LLE. Discussed LB dressing technique and options for LB ADLs (leaning, rolling, standing).  Cognition: Cognition Overall Cognitive Status: Within Functional Limits for tasks assessed Orientation Level: Oriented X4 Cognition Arousal/Alertness: Awake/alert Behavior During Therapy: WFL for tasks assessed/performed Overall Cognitive Status: Within Functional Limits for tasks assessed  Physical Exam: Blood pressure 124/64, pulse 86, temperature 98.6 F (37 C), temperature source Oral, resp. rate 18, height 5' 4.5" (  1.638 m), weight 64.864 kg (143 lb), SpO2 98 %. Physical Exam Constitutional: She is oriented to [name], place, and time.  HENT: oral mucosa is pink and moist Head: Normocephalic.  Eyes: EOM are  normal.  Neck: Normal range of motion. Neck supple. No thyromegaly present.  Cardiovascular: Normal rate and regular rhythm. no murmurs or rubs. Respiratory: Effort normal and breath sounds normal. No respiratory distress.  GI: Soft. Bowel sounds are normal. She exhibits no distension.  Musculoskeletal:  Left BK bulbous, in ACE wrap. Able to extend knee to -5 degrees today. Appropriately tender.  Neurological: She is alert and oriented to [name], place, and time.  UE 5/5 prox to distal. RLE: 4+/5 hf,ke,adf,apf. LLE: 3/5hF, ke 1+. ?mild sensory loss to LT in right foot below ankle.  Skin:  BKA site is dressed appropriately tender. Skin intact RLE.  Psychiatric: She has a normal mood and affect. Her behavior is normal. Judgment and thought content normal   Results for orders placed or performed during the hospital encounter of 01/29/15 (from the past 48 hour(s))  Basic metabolic panel     Status: Abnormal   Collection Time: 01/30/15  3:44 AM  Result Value Ref Range   Sodium 131 (L) 135 - 145 mmol/L   Potassium 3.5 3.5 - 5.1 mmol/L   Chloride 100 (L) 101 - 111 mmol/L   CO2 25 22 - 32 mmol/L   Glucose, Bld 136 (H) 65 - 99 mg/dL   BUN <5 (L) 6 - 20 mg/dL   Creatinine, Ser 0.46 0.44 - 1.00 mg/dL   Calcium 8.3 (L) 8.9 - 10.3 mg/dL   GFR calc non Af Amer >60 >60 mL/min   GFR calc Af Amer >60 >60 mL/min    Comment: (NOTE) The eGFR has been calculated using the CKD EPI equation. This calculation has not been validated in all clinical situations. eGFR's persistently <60 mL/min signify possible Chronic Kidney Disease.    Anion gap 6 5 - 15  CBC     Status: None   Collection Time: 01/30/15  3:44 AM  Result Value Ref Range   WBC 5.9 4.0 - 10.5 K/uL   RBC 4.12 3.87 - 5.11 MIL/uL   Hemoglobin 12.1 12.0 - 15.0 g/dL   HCT 36.6 36.0 - 46.0 %   MCV 88.8 78.0 - 100.0 fL   MCH 29.4 26.0 - 34.0 pg   MCHC 33.1 30.0 - 36.0 g/dL   RDW 13.4 11.5 - 15.5 %   Platelets 224 150 - 400 K/uL  Basic  metabolic panel     Status: Abnormal   Collection Time: 01/31/15  3:31 AM  Result Value Ref Range   Sodium 130 (L) 135 - 145 mmol/L   Potassium 3.4 (L) 3.5 - 5.1 mmol/L   Chloride 97 (L) 101 - 111 mmol/L   CO2 25 22 - 32 mmol/L   Glucose, Bld 120 (H) 65 - 99 mg/dL   BUN <5 (L) 6 - 20 mg/dL   Creatinine, Ser 0.45 0.44 - 1.00 mg/dL   Calcium 8.3 (L) 8.9 - 10.3 mg/dL   GFR calc non Af Amer >60 >60 mL/min   GFR calc Af Amer >60 >60 mL/min    Comment: (NOTE) The eGFR has been calculated using the CKD EPI equation. This calculation has not been validated in all clinical situations. eGFR's persistently <60 mL/min signify possible Chronic Kidney Disease.    Anion gap 8 5 - 15  CBC     Status: None  Collection Time: 01/31/15  3:31 AM  Result Value Ref Range   WBC 6.2 4.0 - 10.5 K/uL   RBC 4.20 3.87 - 5.11 MIL/uL   Hemoglobin 12.5 12.0 - 15.0 g/dL   HCT 37.1 36.0 - 46.0 %   MCV 88.3 78.0 - 100.0 fL   MCH 29.8 26.0 - 34.0 pg   MCHC 33.7 30.0 - 36.0 g/dL   RDW 13.3 11.5 - 15.5 %   Platelets 235 150 - 400 K/uL   No results found.     Medical Problem List and Plan: 1. Functional deficits secondary to left BKA 01/29/2015 2.  DVT Prophylaxis/Anticoagulation: Subcutaneous Lovenox. Monitor platelet counts and any signs of bleeding 3. Pain Management: Tylenol No. 3 and Ultram as needed. Monitor with increased mobility  -pt is having mild phantom limb pain, but it remains tolerable at this point 4. Hypertension. Hydrochlorothiazide 12.5 mg daily. Monitor with increased mobility 5. Neuropsych: This patient is capable of making decisions on her own behalf. 6. Skin/Wound Care: Routine skin checks 7. Fluids/Electrolytes/Nutrition: Routine I&O with follow-up chemistries for tomorrow  -watch potassium 8. Prosthetics: continue ACE wrap for left stump   -prosthetic when appropriate  -consider shrinker sock depending upon appearance of the wound.      Post Admission Physician  Evaluation: 1. Functional deficits secondary  to left below knee amputation. 2. Patient is admitted to receive collaborative, interdisciplinary care between the physiatrist, rehab nursing staff, and therapy team. 3. Patient's level of medical complexity and substantial therapy needs in context of that medical necessity cannot be provided at a lesser intensity of care such as a SNF. 4. Patient has experienced substantial functional loss from his/her baseline which was documented above under the "Functional History" and "Functional Status" headings.  Judging by the patient's diagnosis, physical exam, and functional history, the patient has potential for functional progress which will result in measurable gains while on inpatient rehab.  These gains will be of substantial and practical use upon discharge  in facilitating mobility and self-care at the household level. 5. Physiatrist will provide 24 hour management of medical needs as well as oversight of the therapy plan/treatment and provide guidance as appropriate regarding the interaction of the two. 6. 24 hour rehab nursing will assist with bladder management, bowel management, safety, skin/wound care, disease management, medication administration, pain management and patient education  and help integrate therapy concepts, techniques,education, etc. 7. PT will assess and treat for/with: Lower extremity strength, range of motion, stamina, balance, functional mobility, safety, adaptive techniques and equipment, pain mgt, pre-prosthetic education.   Goals are: mod I. 8. OT will assess and treat for/with: ADL's, functional mobility, safety, upper extremity strength, adaptive techniques and equipment, pre-prosthetic education, pain mgt, ego support.   Goals are: mod I. Therapy may proceed with showering this patient if left leg is completely covered. 9. SLP will assess and treat for/with: n/a.  Goals are: n/a. 10. Case Management and Social Worker will assess  and treat for psychological issues and discharge planning. 11. Team conference will be held weekly to assess progress toward goals and to determine barriers to discharge. 12. Patient will receive at least 3 hours of therapy per day at least 5 days per week. 13. ELOS: 5-7 days       14. Prognosis:  excellent     Zachary T. Swartz, MD, FAAPMR [facility] 01/31/2015   01/31/2015 

## 2015-01-31 NOTE — Interval H&P Note (Signed)
[name] was admitted today to Inpatient Rehabilitation with the diagnosis of left BKA.  The patient's history has been reviewed, patient examined, and there is no change in status.  Patient continues to be appropriate for intensive inpatient rehabilitation.  I have reviewed the patient's chart and labs.  Questions were answered to the patient's satisfaction. The PAPE has been reviewed and assessment remains appropriate.  SWARTZ,ZACHARY T 01/31/2015, 4:57 PM

## 2015-01-31 NOTE — Progress Notes (Signed)
Pt admitted to rehab unit at 1645. Reviewed rehab booklet, process, and safety plan with family and pt. Pt resting in bed with call bell within reach.

## 2015-01-31 NOTE — PMR Pre-admission (Signed)
PMR Admission Coordinator Pre-Admission Assessment  Patient: [name] is an 81 y.o., female MRN: [id number] DOB: 1932-05-11 Height: 5' 4.5" (163.8 cm) Weight: 64.864 kg (143 lb)              Insurance Information HMO:     PPO:       PCP:       IPA:       80/20:       OTHER:   PRIMARY: Medicare A/B      Policy#: [id number]      Subscriber: Leitha Bleak CM Name:        Phone#:       Fax#:   Pre-Cert#:        Employer: Retired Benefits:  Phone #:       Name: Checked in [place]. Date: 11/10/98     Deduct: $1288      Out of Pocket Max: none      Life Max: unlimited CIR: 100%      SNF: 100 days Outpatient: 80%     Co-Pay: 20% Home Health: 100%      Co-Pay: none DME: 80%     Co-Pay: 20% Providers: patient's choice  SECONDARY: UHC       Policy#: [id number]      Subscriber: Leitha Bleak CM Name:        Phone#:       Fax#:   Pre-Cert#:        Employer: Retired Benefits:  Phone #:  [phone]     Name:   Eff. Date:       Deduct:        Out of Pocket Max:        Life Max:   CIR:        SNF:   Outpatient:       Co-Pay:   Home Health:        Co-Pay:   DME:       Co-Pay:    Emergency Contact Information Contact Information    Name Relation Home Work Mobile   [place] Son   [phone]   Jailey, [place] Relative [phone]  [phone]     Current Medical History  Patient Admitting Diagnosis:  L BKA  History of Present Illness: AN 81 y.o. right handed female with history of hypertension, peripheral vascular disease. Patient had been living alone prior to admission with assistance from son and daughter-in-law. Presented 01/29/2015 with nonhealing left foot wound. No relief with conservative care and progressive ischemic changes. 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 Oneida Alar. Hospital course pain management. Subcutaneous Lovenox for DVT prophylaxis. Physical and occupational therapy evaluations completed with recommendations for inpatient rehab. M.D. has requested  physical medicine rehabilitation consult.    Past Medical History  Past Medical History  Diagnosis Date  . Hypertension   . PAD (peripheral artery disease)   . Arthritis   . Cancer     utertrine    Family History  family history includes Dementia in her sister; Diabetes in her mother and son; Heart disease in her mother.  Prior Rehab/Hospitalizations:  Has the patient had major surgery during 100 days prior to admission? No  Current Medications   Current facility-administered medications:  .  0.45 % sodium chloride infusion, , Intravenous, Continuous, Alvia Grove, PA-C, Stopped at 01/30/15 [phone] .  acetaminophen (TYLENOL) tablet 325-650 mg, 325-650 mg, Oral, Q4H PRN **OR** acetaminophen (TYLENOL) suppository 325-650 mg, 325-650 mg, Rectal, Q4H PRN, Alvia Grove, PA-C .  acetaminophen-codeine (TYLENOL #3) 300-30 MG per tablet 1 tablet, 1 tablet, Oral, Q6H PRN, Alvia Grove, PA-C, 1 tablet at 01/29/15 1501 .  alum & mag hydroxide-simeth (MAALOX/MYLANTA) 200-200-20 MG/5ML suspension 15-30 mL, 15-30 mL, Oral, Q2H PRN, Alvia Grove, PA-C .  clopidogrel (PLAVIX) tablet 75 mg, 75 mg, Oral, Daily, Janalyn Harder Trinh, PA-C, 75 mg at 01/30/15 1040 .  docusate sodium (COLACE) capsule 100 mg, 100 mg, Oral, Daily, Kimberly A Trinh, PA-C, 100 mg at 01/30/15 1040 .  enoxaparin (LOVENOX) injection 40 mg, 40 mg, Subcutaneous, Q24H, Kimberly A Trinh, PA-C, 40 mg at 01/31/15 0754 .  guaiFENesin-dextromethorphan (ROBITUSSIN DM) 100-10 MG/5ML syrup 15 mL, 15 mL, Oral, Q4H PRN, Alvia Grove, PA-C .  hydrALAZINE (APRESOLINE) injection 5 mg, 5 mg, Intravenous, Q20 Min PRN, Alvia Grove, PA-C .  hydrochlorothiazide (MICROZIDE) capsule 12.5 mg, 12.5 mg, Oral, Daily, Kimberly A Trinh, PA-C, 12.5 mg at 01/30/15 1040 .  Influenza vac split quadrivalent PF (FLUARIX) injection 0.5 mL, 0.5 mL, Intramuscular, Tomorrow-1000, Elam Dutch, MD .  labetalol (NORMODYNE,TRANDATE) injection 10 mg,  10 mg, Intravenous, Q10 min PRN, Alvia Grove, PA-C .  magnesium sulfate IVPB 2 g 50 mL, 2 g, Intravenous, Daily PRN, Alvia Grove, PA-C .  metoprolol (LOPRESSOR) injection 2-5 mg, 2-5 mg, Intravenous, Q2H PRN, Alvia Grove, PA-C .  morphine 2 MG/ML injection 1-4 mg, 1-4 mg, Intravenous, Q1H PRN, Alvia Grove, PA-C, 4 mg at 01/30/15 0551 .  neomycin-bacitracin-polymyxin (NEOSPORIN) ointment, , Topical, BID, Elam Dutch, MD .  ondansetron [facility]) injection 4 mg, 4 mg, Intravenous, Q6H PRN, Alvia Grove, PA-C .  pantoprazole (PROTONIX) EC tablet 40 mg, 40 mg, Oral, Daily, Janalyn Harder Trinh, PA-C, 40 mg at 01/30/15 1040 .  phenol (CHLORASEPTIC) mouth spray 1 spray, 1 spray, Mouth/Throat, PRN, Alvia Grove, PA-C .  potassium chloride SA (K-DUR,KLOR-CON) CR tablet 20-40 mEq, 20-40 mEq, Oral, Daily PRN, Alvia Grove, PA-C .  traMADol (ULTRAM) tablet 25 mg, 25 mg, Oral, Q2H PRN, Elam Dutch, MD, 25 mg at 01/31/15 0144  Patients Current Diet: Diet regular Room service appropriate?: Yes; Fluid consistency:: Thin  Precautions / Restrictions Precautions Precautions: Fall Restrictions Weight Bearing Restrictions: Yes LLE Weight Bearing: Non weight bearing   Has the patient had 2 or more falls or a fall with injury in the past year?No  Prior Activity Level Community (5-7x/wk): Went out daily.  Walked and exercised.  Has not been driving for the past 6 months.  Home Assistive Devices / Equipment Home Assistive Devices/Equipment: Eyeglasses Home Equipment: [place] 2 wheels, Hospital bed (access to [facility] and shower chair)  Prior Device Use: Indicate devices/aids used by the patient prior to current illness, exacerbation or injury? None.  However, holds onto arm of dtr-in-law outside on unlevel walking surfaces.  Prior Functional Level Prior Function Level of Independence: Independent with assistive device(s) Comments: Has been sponge bathing since April as she  is scared of falling in the shower. Uses HHA in the community for assist. Pt reports she has been working at both upper and lower body strengthening prior to surgery.   Self Care: Did the patient need help bathing, dressing, using the toilet or eating?  Independent  Indoor Mobility: Did the patient need assistance with walking from room to room (with or without device)? Independent  Stairs: Did the patient need assistance with internal or external stairs (with or without device)? Independent  Functional Cognition: Did the patient need  help planning regular tasks such as shopping or remembering to take medications? Independent  Current Functional Level Cognition  Overall Cognitive Status: Within Functional Limits for tasks assessed Orientation Level: Oriented X4    Extremity Assessment (includes Sensation/Coordination)  Upper Extremity Assessment: Overall WFL for tasks assessed  Lower Extremity Assessment: Defer to PT evaluation LLE Deficits / Details: Decreased strength and AROM consistent with procedure listed above    ADLs  Overall ADL's : Needs assistance/impaired Grooming: Set up, Sitting Lower Body Dressing: Moderate assistance, Sit to/from stand Lower Body Dressing Details (indicate cue type and reason): for balance Toilet Transfer: Min guard, Stand-pivot, RW (took steps with stand pivot; chair <> bed ) Functional mobility during ADLs: Min guard, Rolling walker (stand pivot; took some steps with stand pivot) General ADL Comments: Educated on desensitization techniques for LLE. Discussed LB dressing technique and options for LB ADLs (leaning, rolling, standing).    Mobility  Overal bed mobility: Needs Assistance Bed Mobility: Supine to Sit Supine to sit: Supervision, HOB elevated General bed mobility comments: Pt able to transition to EOB with use of bed rails and HOB elevated. Feel she would have required assistance if transferring from supine in a flat, regular bed without  railings.     Transfers  Overall transfer level: Needs assistance Equipment used: Rolling walker (2 wheeled) Transfers: Sit to/from Stand Sit to Stand: Min assist Stand pivot transfers: Min guard General transfer comment: Assist to steady and power-up to full standing position. Once standing, increased time to gain maintain balance prior to initiating gait training.    Ambulation / Gait / Stairs / Wheelchair Mobility  Ambulation/Gait Ambulation/Gait assistance: Min assist, +2 physical assistance, +2 safety/equipment Ambulation Distance (Feet): 100 Feet Assistive device: Rolling walker (2 wheeled) Gait Pattern/deviations: Step-to pattern, Decreased stride length, Trunk flexed General Gait Details: Pt was cued for general sequencing and technique with the RW. Pt required frequent cues for improved posture and to self-monitor fatigue level. Encouraged standing rest breaks. By the end of gait training pt required +2 assist for balance and support as she appeared very fatigued and unstable.  Gait velocity: Decreased Gait velocity interpretation: Below normal speed for age/gender    Posture / Balance Balance Overall balance assessment: Needs assistance Sitting-balance support: Feet supported, No upper extremity supported Sitting balance-Leahy Scale: Fair Standing balance support: Bilateral upper extremity supported, During functional activity Standing balance-Leahy Scale: Poor    Special needs/care consideration BiPAP/CPAP No CPM No Continuous Drip IV No Dialysis No      Life Vest No Oxygen No Special Bed No Trach Size No Wound Vac (area) No      Skin Has dry skin.  Has a pressure dressing on L BKA stump incision.                              Bowel mgmt: Last BM 01/29/15 prior to admission Bladder mgmt: Voiding on bedpan and on BSC Diabetic mgmt No    Previous Home Environment Living Arrangements: Alone Available Help at Discharge: Family Type of Home: House Home Layout: Two  level, Able to live on main level with bedroom/bathroom Home Access: Stairs to enter [employer] of Steps: 2 Bathroom Shower/Tub: Optometrist: Yes [place]: No  Discharge Living Setting Plans for Discharge Living Setting: Patient's home, Alone, House (Lives alone, but son can stay at night with patient.) Type of Home at Discharge: House Discharge Home Layout:  Two level, Able to live on main level with bedroom/bathroom Alternate Level Stairs-Number of Steps: 14 Discharge Home Access: Stairs to enter Entrance Stairs-Number of Steps: 2 steps to porch and 1 step into kitchen from porch. Does the patient have any problems obtaining your medications?: No  Social/Family/Support Systems Patient Roles: Parent (Has a son and a dtr-in-law.) Contact Information: Pyper Olexa and Carren Rang - son and dtr-in-law Anticipated Caregiver: self and family Anticipated Caregiver's Contact Information: Camille Dragan - dtr-in-law (c) [phone] Ability/Limitations of Caregiver: Dtr in law stays with patient daily Caregiver Availability: Intermittent Discharge Plan Discussed with Primary Caregiver: Yes Is Caregiver In Agreement with Plan?: Yes Does Caregiver/Family have Issues with Lodging/Transportation while Pt is in Rehab?: No  Goals/Additional Needs Patient/Family Goal for Rehab: PT/OT mod I goals Expected length of stay: 5-7 days Cultural Considerations: None Dietary Needs: Regular diet with thin liquids Equipment Needs: TBD Pt/Family Agrees to Admission and willing to participate: Yes Program Orientation Provided & Reviewed with Pt/Caregiver Including Roles  & Responsibilities: Yes  Decrease burden of Care through IP rehab admission: N/A  Possible need for SNF placement upon discharge: Not anticipated  Patient Condition: This patient's condition remains as documented in the consult dated 01/30/15, in which the  Rehabilitation Physician determined and documented that the patient's condition is appropriate for intensive rehabilitative care in an inpatient rehabilitation facility. Will admit to inpatient rehab today.  Preadmission Screen Completed By:  Retta Diones, 01/31/2015 11:19 AM ______________________________________________________________________   Discussed status with Dr. Naaman Plummer on 01/31/15 at 1119 and received telephone approval for admission today.  Admission Coordinator:  Retta Diones, time1119/Date09/21/16

## 2015-01-31 NOTE — Progress Notes (Signed)
Rehab admissions - I spoke with PT today and patient still needs assistance with transfers and ambulation.  Patient and dtr-in-law want inpatient rehab admission.  Bed available and will admit to acute inpatient rehab today.  Call me for questions.  #[phone]

## 2015-01-31 NOTE — Progress Notes (Signed)
Pt to go to CIR today- CSW signing off.  Domenica Reamer, [place] Social Worker [phone]

## 2015-01-31 NOTE — Progress Notes (Addendum)
  [facility] Progress Note  Subjective  - POD #2  Having mild pain.   Objective Filed Vitals:   01/31/15 0438  BP: 136/62  Pulse: 74  Temp: 98.6 F (37 C)  Resp: 18    Intake/Output Summary (Last 24 hours) at 01/31/15 0823 Last data filed at 01/31/15 0636  Gross per 24 hour  Intake    480 ml  Output   2950 ml  Net  -2470 ml    Left BKA staple line clean and intact. Edematous.   Assessment/Planning: 81 y.o. female is s/p: left BKA 2 Days Post-Op   Stump is viable. Dispo: CIR vs SNF vs home. Patient's first choice is CIR, but no bed available today. Due to medicare rules, patient must stay 3 nights for SNF. Will keep here tonight with plans for discharge tomorrow. Patient and family to decide between SNF vs home if CIR unavailable tomorrow.   Alvia Grove 01/31/2015 8:23 AM --  Laboratory CBC    Component Value Date/Time   WBC 6.2 01/31/2015 0331   WBC 4.6 08/22/2014 1313   HGB 12.5 01/31/2015 0331   HGB 13.8 08/22/2014 1313   HCT 37.1 01/31/2015 0331   HCT 43.6 08/22/2014 1313   PLT 235 01/31/2015 0331    BMET    Component Value Date/Time   NA 130* 01/31/2015 0331   K 3.4* 01/31/2015 0331   CL 97* 01/31/2015 0331   CO2 25 01/31/2015 0331   GLUCOSE 120* 01/31/2015 0331   BUN <5* 01/31/2015 0331   CREATININE 0.45 01/31/2015 0331   CREATININE 0.52 08/22/2014 1308   CALCIUM 8.3* 01/31/2015 0331   GFRNONAA >60 01/31/2015 0331   GFRAA >60 01/31/2015 0331    COAG No results found for: INR, PROTIME No results found for: PTT  Antibiotics Anti-infectives    Start     Dose/Rate Route Frequency Ordered Stop   01/29/15 1830  cefUROXime (ZINACEF) 1.5 g in dextrose 5 % 50 mL IVPB     1.5 g 100 mL/hr over 30 Minutes Intravenous Every 12 hours 01/29/15 1714 01/30/15 0621   01/29/15 0912  dextrose 5 % with cefUROXime (ZINACEF) ADS Med    Comments:  Leandrew Koyanagi   : cabinet override      01/29/15 0912 01/29/15 2129   01/29/15 0908   cefUROXime (ZINACEF) 1.5 g in dextrose 5 % 50 mL IVPB     1.5 g 100 mL/hr over 30 Minutes Intravenous 30 min pre-op 01/29/15 0908 01/29/15 1020       Virgina Jock, PA-C [facility] Office: [phone] Pager: [phone] 01/31/2015 8:23 AM  Agree with above To Rehab today  [name], MD [facility] [place] Office: [phone] Pager: [phone]

## 2015-01-31 NOTE — Progress Notes (Signed)
Physical Therapy Treatment Patient Details Name: [name] MRN: [id number] DOB: 01-10-1932 Today's Date: 01/31/2015    History of Present Illness Pt is an 81 y.o. 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    PT Comments    Pt progressing towards physical therapy goals. Is motivated to work with therapy and ambulate today. Tolerated HEP with no complaints of increased pain and good form. Overall pt requiring min assist to min assist +2 for mobility. Continue to recommend comprehensive inpatient rehab (CIR) for post-acute therapy needs.    Follow Up Recommendations  CIR;Supervision/Assistance - 24 hour     Equipment Recommendations  None recommended by PT    Recommendations for Other Services Rehab consult     Precautions / Restrictions Precautions Precautions: Fall Restrictions Weight Bearing Restrictions: Yes LLE Weight Bearing: Non weight bearing    Mobility  Bed Mobility Overal bed mobility: Needs Assistance Bed Mobility: Supine to Sit     Supine to sit: Supervision;HOB elevated     General bed mobility comments: Pt able to transition to EOB with use of bed rails and HOB elevated. Feel she would have required assistance if transferring from supine in a flat, regular bed without railings.   Transfers Overall transfer level: Needs assistance Equipment used: Rolling walker (2 wheeled) Transfers: Sit to/from Stand Sit to Stand: Min assist         General transfer comment: Assist to steady and power-up to full standing position. Once standing, increased time to gain maintain balance prior to initiating gait training.  Ambulation/Gait Ambulation/Gait assistance: Min assist;+2 physical assistance;+2 safety/equipment Ambulation Distance (Feet): 100 Feet Assistive device: Rolling walker (2 wheeled) Gait Pattern/deviations: Step-to pattern;Decreased stride length;Trunk flexed Gait velocity: Decreased Gait velocity interpretation: Below normal speed for  age/gender General Gait Details: Pt was cued for general sequencing and technique with the RW. Pt required frequent cues for improved posture and to self-monitor fatigue level. Encouraged standing rest breaks. By the end of gait training pt required +2 assist for balance and support as she appeared very fatigued and unstable.    Stairs            Wheelchair Mobility    Modified Rankin (Stroke Patients Only)       Balance Overall balance assessment: Needs assistance Sitting-balance support: Feet supported;No upper extremity supported Sitting balance-Leahy Scale: Fair     Standing balance support: Bilateral upper extremity supported;During functional activity Standing balance-Leahy Scale: Poor                      Cognition Arousal/Alertness: Awake/alert Behavior During Therapy: WFL for tasks assessed/performed Overall Cognitive Status: Within Functional Limits for tasks assessed                      Exercises Amputee Exercises Quad Sets: 10 reps Hip ABduction/ADduction: 10 reps Straight Leg Raises: 10 reps Chair Push Up:  (Instructed but did not perform)    General Comments        Pertinent Vitals/Pain Pain Assessment: Faces Faces Pain Scale: Hurts little more Pain Location: LLE Pain Descriptors / Indicators: Operative site guarding;Grimacing Pain Intervention(s): Limited activity within patient's tolerance;Monitored during session;Repositioned    Home Living                      Prior Function            PT Goals (current goals can now be found in the care plan section)  Acute Rehab PT Goals Patient Stated Goal: To get to CIR PT Goal Formulation: With patient/family Time For Goal Achievement: 02/06/15 Potential to Achieve Goals: Good Progress towards PT goals: Progressing toward goals    Frequency  Min 4X/week    PT Plan Current plan remains appropriate    Co-evaluation             End of Session Equipment  Utilized During Treatment: Gait belt Activity Tolerance: Patient tolerated treatment well Patient left: in chair;with call bell/phone within reach;with family/visitor present;with chair alarm set     Time: 7998-7215 PT Time Calculation (min) (ACUTE ONLY): 23 min  Charges:  $Gait Training: 8-22 mins $Therapeutic Exercise: 8-22 mins                    G Codes:      Rolinda Roan 13-Feb-2015, 10:35 AM   Rolinda Roan, PT, DPT Acute Rehabilitation Services Pager: [phone]

## 2015-01-31 NOTE — Discharge Summary (Signed)
[facility] Discharge Summary  [name] 1931-09-26 81 y.o. female  [id number]  Admission Date: 01/29/2015  Discharge Date: 01/31/2015  Physician: Elam Dutch, MD  Admission Diagnosis: Left lower extremity gangrene and ischemia  HPI:   This is a 81 y.o. female who presented for evaluation of painful left first toe. The patient has a several month history of what was thought to be an ingrown toenail on the left first toe. This has failed to heal. She was on Keflex for 2 weeks. This did not improve. She has had no drainage from the toe. She denies history of diabetes. She does have a history of hypertension. She states her cholesterol has been okay. 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 At that time it was mentioned to her that she was unreconstructable and the only option would be a below-knee amputation. She is here today to consider this. She is on Plavix presumably for peripheral arterial disease.  Hospital Course:  The patient was admitted to the hospital and taken to the operating room on 01/29/2015 and underwent: Left below the knee amputation    The patient tolerated the procedure well and was transported to the PACU in good condition.   The patient had mild pain on POD 1. Her stump was healing well when the dressing was taken down on POD 2. She had some edema of her stump that was present preoperatively. Physical and occupational therapy recommended inpatient rehab. She was accepted by [facility] and discharged there in good condition on 01/31/15.   CBC    Component Value Date/Time   WBC 6.2 01/31/2015 0331   WBC 4.6 08/22/2014 1313   RBC 4.20 01/31/2015 0331   RBC 4.80 08/22/2014 1313   HGB 12.5 01/31/2015 0331   HGB 13.8 08/22/2014 1313   HCT 37.1 01/31/2015 0331   HCT 43.6 08/22/2014 1313   PLT 235 01/31/2015 0331   MCV 88.3 01/31/2015 0331   MCV 90.7 08/22/2014  1313   MCH 29.8 01/31/2015 0331   MCH 28.8 08/22/2014 1313   MCHC 33.7 01/31/2015 0331   MCHC 31.8 08/22/2014 1313   RDW 13.3 01/31/2015 0331    BMET    Component Value Date/Time   NA 130* 01/31/2015 0331   K 3.4* 01/31/2015 0331   CL 97* 01/31/2015 0331   CO2 25 01/31/2015 0331   GLUCOSE 120* 01/31/2015 0331   BUN <5* 01/31/2015 0331   CREATININE 0.45 01/31/2015 0331   CREATININE 0.52 08/22/2014 1308   CALCIUM 8.3* 01/31/2015 0331   GFRNONAA >60 01/31/2015 0331   GFRAA >60 01/31/2015 0331     Discharge Instructions:   The patient is discharged to CIR with extensive instructions on wound care and progressive ambulation.  They are instructed not to drive or perform any heavy lifting until returning to see the physician in his office.  Discharge Instructions    Call MD for:  redness, tenderness, or signs of infection (pain, swelling, bleeding, redness, odor or green/yellow discharge around incision site)    Complete by:  As directed      Call MD for:  severe or increased pain, loss or decreased feeling  in affected limb(s)    Complete by:  As directed      Call MD for:  temperature >100.5    Complete by:  As directed      Discharge wound care:    Complete by:  As directed  Wash stump daily with soap and water and pat dry. Apply 4 x 4s to staple line, wrap with kerlix and ACE daily.     Driving Restrictions    Complete by:  As directed   No driving for 4 weeks     Increase activity slowly    Complete by:  As directed   Walk with assistance use walker or cane as needed     Lifting restrictions    Complete by:  As directed   No lifting for 1 week     Resume previous diet    Complete by:  As directed            Discharge Diagnosis:  Left lower extremity gangrene and ischemia  Secondary Diagnosis: Patient Active Problem List   Diagnosis Date Noted  . Non-healing wound of lower extremity 01/29/2015  . Ulcer of toe of left foot 12/27/2014  . Essential hypertension  08/22/2014   Past Medical History  Diagnosis Date  . Hypertension   . PAD (peripheral artery disease)   . Arthritis   . Cancer     utertrine       Medication List    TAKE these medications        acetaminophen-codeine 300-30 MG per tablet  Commonly known as:  TYLENOL #3  TAKE 1/2 TO 1 TABLET BY MOUTH EVERY 6 HOURS AS NEEDED     amLODipine 2.5 MG tablet  Commonly known as:  NORVASC  Take 1 tablet (2.5 mg total) by mouth daily.     clopidogrel 75 MG tablet  Commonly known as:  PLAVIX  Take 1 tablet (75 mg total) by mouth daily.     hydrochlorothiazide 12.5 MG tablet  Commonly known as:  HYDRODIURIL  Take 1 tablet (12.5 mg total) by mouth daily.     hydrochlorothiazide 12.5 MG capsule  Commonly known as:  MICROZIDE  Take 12.5 mg by mouth daily.     lidocaine 4 % cream  Commonly known as:  LMX  Apply 1 application topically as needed. Use as needed for pain prior to toe procedure     neomycin-bacitracin-polymyxin ointment  Commonly known as:  NEOSPORIN  Apply 1 application topically 2 (two) times daily. Apply to toe and foot for wound care     neomycin-bacitracin-polymyxin ointment  Commonly known as:  NEOSPORIN  Apply 1 application topically 2 (two) times daily. apply to eye     sulfamethoxazole-trimethoprim 800-160 MG per tablet  Commonly known as:  BACTRIM DS,SEPTRA DS  Take 1 tablet by mouth 2 (two) times daily.     SYSTANE FREE OP  Place 1 drop into both eyes daily. For dry eyes     traMADol 50 MG tablet  Commonly known as:  ULTRAM  Take 1 or 2 every 4-6 hours as needed for pain.  Max 300 mg (6 pills) in 24 hours        Disposition: CIR  Patient's condition: is Good  Follow up: 1. Dr. Oneida Alar in 4 weeks   Virgina Jock, PA-C [facility] [phone] 01/31/2015  2:23 PM

## 2015-02-01 ENCOUNTER — Inpatient Hospital Stay (HOSPITAL_COMMUNITY): Payer: Medicare Other | Admitting: Physical Therapy

## 2015-02-01 ENCOUNTER — Inpatient Hospital Stay (HOSPITAL_COMMUNITY): Payer: Medicare Other | Admitting: Occupational Therapy

## 2015-02-01 DIAGNOSIS — E876 Hypokalemia: Secondary | ICD-10-CM

## 2015-02-01 LAB — COMPREHENSIVE METABOLIC PANEL
ALBUMIN: 2.6 g/dL — AB (ref 3.5–5.0)
ALT: 14 U/L (ref 14–54)
AST: 27 U/L (ref 15–41)
Alkaline Phosphatase: 56 U/L (ref 38–126)
Anion gap: 7 (ref 5–15)
BILIRUBIN TOTAL: 0.4 mg/dL (ref 0.3–1.2)
CO2: 25 mmol/L (ref 22–32)
CREATININE: 0.44 mg/dL (ref 0.44–1.00)
Calcium: 8.3 mg/dL — ABNORMAL LOW (ref 8.9–10.3)
Chloride: 98 mmol/L — ABNORMAL LOW (ref 101–111)
GFR calc Af Amer: >60 mL/min (ref >60)
GLUCOSE: 127 mg/dL — AB (ref 65–99)
Potassium: 3.4 mmol/L — ABNORMAL LOW (ref 3.5–5.1)
Sodium: 130 mmol/L — ABNORMAL LOW (ref 135–145)
TOTAL PROTEIN: 5.8 g/dL — AB (ref 6.5–8.1)

## 2015-02-01 LAB — CBC WITH DIFFERENTIAL/PLATELET
BASOS ABS: 0 10*3/uL (ref 0.0–0.1)
BASOS PCT: 0 %
Eosinophils Absolute: 0 10*3/uL (ref 0.0–0.7)
Eosinophils Relative: 0 %
HEMATOCRIT: 36.3 % (ref 36.0–46.0)
HEMOGLOBIN: 12.4 g/dL (ref 12.0–15.0)
LYMPHS PCT: 17 %
Lymphs Abs: 1 10*3/uL (ref 0.7–4.0)
MCH: 30.2 pg (ref 26.0–34.0)
MCHC: 34.2 g/dL (ref 30.0–36.0)
MCV: 88.3 fL (ref 78.0–100.0)
Monocytes Absolute: 0.9 10*3/uL (ref 0.1–1.0)
Monocytes Relative: 15 %
NEUTROS ABS: 3.8 10*3/uL (ref 1.7–7.7)
NEUTROS PCT: 68 %
Platelets: 212 10*3/uL (ref 150–400)
RBC: 4.11 MIL/uL (ref 3.87–5.11)
RDW: 13.4 % (ref 11.5–15.5)
WBC: 5.6 10*3/uL (ref 4.0–10.5)

## 2015-02-01 MED ORDER — POTASSIUM CHLORIDE CRYS ER 20 MEQ PO TBCR
20.0000 meq | EXTENDED_RELEASE_TABLET | Freq: Every day | ORAL | Status: DC
  Administered 2015-02-01 – 2015-02-08 (×8): 20 meq via ORAL
  Filled 2015-02-01 (×8): qty 1

## 2015-02-01 NOTE — Evaluation (Signed)
Physical Therapy Assessment and Plan  Patient Details  Name: [name] MRN: [id number] Date of Birth: [birth year]  PT Diagnosis: Abnormal posture, Coordination disorder, Difficulty walking and Muscle weakness Rehab Potential: Excellent ELOS: 7 days   Today's Date: 02/01/2015 PT Individual Time: 0900-1000 and 1400-1500 PT Individual Time Calculation (min): 60 min and 60 min    Problem List:  Patient Active Problem List   Diagnosis Date Noted  . Status post below knee amputation of left lower extremity 01/31/2015  . Non-healing wound of lower extremity 01/29/2015  . Ulcer of toe of left foot 12/27/2014  . Essential hypertension 08/22/2014    Past Medical History:  Past Medical History  Diagnosis Date  . Hypertension   . PAD (peripheral artery disease)   . Arthritis   . Cancer     utertrine   Past Surgical History:  Past Surgical History  Procedure Laterality Date  . Spine surgery    . Knee surgery Right 1984  . Peripheral vascular catheterization N/A 09/15/2014    Procedure: Abdominal Aortogram;  Surgeon: Elam Dutch, MD;  Location: [facility] INVASIVE CV LAB CUPID;  Service: Cardiovascular;  Laterality: N/A;  . Peripheral vascular catheterization Bilateral 09/15/2014    Procedure: Lower Extremity Angiography;  Surgeon: Elam Dutch, MD;  Location: [place];  Service: Cardiovascular;  Laterality: Bilateral;  . Abdominal hysterectomy      partial  . Amputation Left 01/29/2015    Procedure: Left  AMPUTATION BELOW KNEE;  Surgeon: Elam Dutch, MD;  Location: [facility] OR;  Service: Vascular;  Laterality: Left;    Assessment & Plan Clinical Impression: Patient is a 81 y.o. year old female with history of hypertension, peripheral vascular disease. Patient had been living alone prior to admission with assistance from son and daughter-in-law. Presented 01/29/2015 with nonhealing left foot wound. No relief with conservative care and progressive ischemic changes.  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 Oneida Alar. Hospital course pain management. Subcutaneous Lovenox for DVT prophylaxis. Physical and occupational therapy evaluations completed with recommendations for inpatient rehab. M.D. has requested physical medicine rehabilitation consult.   Patient currently requires min with mobility secondary to muscle weakness, decreased cardiorespiratoy endurance, decreased coordination and decreased standing balance, decreased postural control and decreased balance strategies.  Prior to hospitalization, patient was independent  with mobility and lived with Alone in a House home.  Home access is 2 steps with no rails to enter.  Pt's family is building a ramp. Home is w/c accessible, per patient, but anticipate that she will be household ambulatory at d/c.  Patient will benefit from skilled PT intervention to maximize safe functional mobility, minimize fall risk and decrease caregiver burden for planned discharge home with 24 hour supervision.  Anticipate patient will benefit from follow up OP at discharge.  PT - End of Session Activity Tolerance: Tolerates 30+ min activity with multiple rests Endurance Deficit: Yes  PT Assessment Rehab Potential (ACUTE/IP ONLY): Excellent Barriers to Discharge: Decreased caregiver support  PT Patient demonstrates impairments in the following area(s): Balance;Motor;Endurance;Pain;Safety PT Transfers Functional Problem(s): Bed Mobility;Bed to Chair;Car;Furniture PT Locomotion Functional Problem(s): Ambulation;Wheelchair Mobility;Stairs  PT Plan PT Intensity: Minimum of 1-2 x/day ,45 to 90 minutes PT Frequency: 5 out of 7 days PT Duration Estimated Length of Stay: 7 days PT Treatment/Interventions: Ambulation/gait training;Balance/vestibular training;Community reintegration;Discharge planning;DME/adaptive equipment instruction;Functional mobility training;Neuromuscular re-education;Pain management;Patient/family education;Psychosocial  support;Stair training;Therapeutic Activities;Therapeutic Exercise;UE/LE Strength taining/ROM;UE/LE Coordination activities;Wheelchair propulsion/positioning PT Transfers Anticipated Outcome(s): mod I PT Locomotion Anticipated Outcome(s): supervision  PT Recommendation Recommendations for Other Services: Neuropsych consult Follow Up Recommendations: HH PT;24 hour supervision/assistance Patient destination: Home Equipment Recommended: To be determined Equipment Details: will need RW and w/c if pt doesn't already have one   Skilled Therapeutic Intervention Session 1: Pt received sitting in recliner with daughter in law, Jackelyn Poling, present and agreeable to PT.  PT provided patient and family education on role of PT and goals of care.  PT instructed patient in stand/pivot transfer from recliner to w/c with steady A to stand with RW and pivot. Pt attempted to sit in w/c before she was close enough and required max A to get safely seated in chair.  PT educated patient on appropriate sequencing and safety and patient verbalized understanding.  Pt propelled w/c x150' to therapy gym with supervision cues for propulsion using UEs and turning.  Pt transferred from w/c to therapy mat with RW and supervision, demonstrating safe approximation to mat before sitting.  PT instructed patient in 3" curb negotiation, backwards ascent/fowards descent with RW and steady A with verbal cues for sequencing.  PT provided patient with handout and reviewed residual limb wrapping with patient and daughter.  PT instructed patient in amb with RW x150' with steady A and verbal cues for use of UEs and R knee flexion to decrease impact of hopping on L residual limb.  Pt returned to room at end of session and positioned in recliner with call bell in reach and needs met.   Session 2: Pt received in recliner and agreeable to therapy session.  Session focused on safe functional transfers, stair negotiation, and LE strengthening/ROM.  Pt  performs sit<>stand from a variety of surfaces with RW and supervision, occasionally requiring cues to back all the way up to sitting surface prior to initiating sit.  Pt propels w/c 3x150 with supervision and PT initiates instruction in parts management.   PT instructed patient in car transfer with RW and steady A with verbal cues for sequencing and safety.  PT instructed patient in 6" stair negotiation, forward ascent/descent with 2 hand rails.  Max A to ascend/descend 4 steps. PT instructs patient in LLE therex x20 reps for glute sets, isometric hip extension (against 4" bolster roll), hip abduction in sidelying, and LAQ.  PT instructed patient in light compression and distal/proximal massage to reduce L residual limb pain.  Pt returned to room in w/c at end of session, toileted with supervision and verbal cues for use of grab bar when pulling pants up.  Pt positioned in recliner at end of session with call bell in reach and needs met.   PT Evaluation Precautions/Restrictions Precautions Precautions: Fall Precaution Comments: new left BKA Restrictions Weight Bearing Restrictions: Yes LLE Weight Bearing: Non weight bearing General Chart Reviewed: Yes Additional Pertinent History: PMH of HTN and PVD    Pain Pain Assessment Pain Assessment: 0-10 Pain Score: 6  Pain Type: Surgical pain Pain Location: Leg Pain Orientation: Left Pain Intervention(s): Emotional support;Repositioned;RN made aware Multiple Pain Sites: No Home Living/Prior Functioning Home Living Living Arrangements: Alone Available Help at Discharge: Family (son and daughter availble to help at d/c and can provide 24/7 supervision/assist) Type of Home: House Home Access: Stairs to enter [employer] of Steps: 2 steps, they plan on building a ramp Entrance Stairs-Rails: None Home Layout: Two level;Able to live on main level with bedroom/bathroom Bathroom Shower/Tub: Tub/shower unit;Curtain Bathroom Toilet:  Standard Bathroom Accessibility: Yes  Lives With: Alone Prior Function Level of Independence: Independent with homemaking with ambulation;Independent  with gait;Independent with transfers;Independent with basic ADLs  Able to Take Stairs?: Yes Driving: No Vocation: Retired Leisure: Hobbies-yes (Comment) Comments: Likes to garden ("piddle around the yard") and read, hasn't been able to read much lately because of the ulcer on her LLE PTA Vision/Perception     Cognition Overall Cognitive Status: Within Functional Limits for tasks assessed Arousal/Alertness: Awake/alert Orientation Level: Oriented X4 Attention: Selective Selective Attention: Appears intact Memory: Appears intact Awareness: Appears intact Problem Solving: Appears intact Safety/Judgment: Appears intact Comments: pt did attempt to sit in w/c before being close enough to reach it, LOB required max A to recover, corrected behavior for stand/pivot from w/c to therapy mat without cuing Sensation Sensation Light Touch: Appears Intact Proprioception: Appears Intact Coordination Gross Motor Movements are Fluid and Coordinated: No Fine Motor Movements are Fluid and Coordinated: Yes Finger Nose Finger Test: no dysmetria noted, minimally decreased speed Heel Shin Test: not tested Motor  Motor Motor: Abnormal postural alignment and control;Other (comment) (generalized weakness)  Mobility Bed Mobility Bed Mobility: Rolling Right;Supine to Sit;Sit to Supine Rolling Right: 5: Supervision Supine to Sit: 4: Min guard Sit to Supine: 4: Min guard Transfers Transfers: Yes Sit to Stand: 4: Min guard Stand to Sit: 4: Min guard Stand Pivot Transfers: 4: Min guard Locomotion  Ambulation Ambulation: Yes Ambulation/Gait Assistance: 4: Min Wellsite geologist (Feet): 125 Feet Assistive device: Rolling walker Ambulation/Gait Assistance Details: Verbal cues for sequencing;Verbal cues for technique;Verbal cues for  precautions/safety;Verbal cues for safe use of DME/AE;Verbal cues for gait pattern;Manual facilitation for weight shifting Gait Gait: Yes Gait Pattern: Impaired Gait Pattern: Decreased hip/knee flexion - right;Trunk flexed;Decreased trunk rotation Stairs / Additional Locomotion Curb: 4: Min Chemical engineer: Yes Wheelchair Assistance: 5: Investment banker, operational Details: Verbal cues for safe use of DME/AE;Verbal cues for Marketing executive: Both upper extremities Wheelchair Parts Management: Needs assistance Distance: 150  Trunk/Postural Assessment  Cervical Assessment Cervical Assessment: Within Functional Limits Thoracic Assessment Thoracic Assessment: Within Functional Limits Lumbar Assessment Lumbar Assessment: Within Functional Limits Postural Control Postural Control: Deficits on evaluation Righting Reactions: decreased when assessed in standing requiring min A to return to midline  Balance Balance Balance Assessed: Yes Dynamic Sitting Balance Dynamic Sitting - Balance Support: Right upper extremity supported;Left upper extremity supported;Feet supported (RLE support only) Dynamic Sitting - Level of Assistance: 6: Modified independent (Device/Increase time) Dynamic Sitting - Balance Activities: Tourist information centre manager Standing - Balance Support: Right upper extremity supported;Left upper extremity supported Static Standing - Level of Assistance: 5: Stand by assistance Dynamic Standing Balance Dynamic Standing - Balance Support: Right upper extremity supported;Left upper extremity supported Dynamic Standing - Level of Assistance: 4: Min assist Dynamic Standing - Balance Activities: Ball toss Extremity Assessment  RLE Assessment RLE Assessment: Within Functional Limits LLE Assessment LLE Assessment: X LLE Strength LLE Overall Strength Comments: hip flexion 3+/5, knee extension 3-/5, knee flexion  3-/5   See Function Navigator for Current Functional Status.   Refer to Care Plan for Long Term Goals  Recommendations for other services: Neuropsych  Discharge Criteria: Patient will be discharged from PT if patient refuses treatment 3 consecutive times without medical reason, if treatment goals not met, if there is a change in medical status, if patient makes no progress towards goals or if patient is discharged from hospital.  The above assessment, treatment plan, treatment alternatives and goals were discussed and mutually agreed upon: by patient and by family  Evie Lacks 02/01/2015, 3:31 PM

## 2015-02-01 NOTE — Progress Notes (Signed)
Social Work  Social Work Assessment and Plan  Patient Details  Name: [name] MRN: [id number] Date of Birth: Aug 18, 1931  Today's Date: 02/01/2015  Problem List:  Patient Active Problem List   Diagnosis Date Noted  . Status post below knee amputation of left lower extremity 01/31/2015  . Non-healing wound of lower extremity 01/29/2015  . Ulcer of toe of left foot 12/27/2014  . Essential hypertension 08/22/2014   Past Medical History:  Past Medical History  Diagnosis Date  . Hypertension   . PAD (peripheral artery disease)   . Arthritis   . Cancer     utertrine   Past Surgical History:  Past Surgical History  Procedure Laterality Date  . Spine surgery    . Knee surgery Right 1984  . Peripheral vascular catheterization N/A 09/15/2014    Procedure: Abdominal Aortogram;  Surgeon: Elam Dutch, MD;  Location: [facility] INVASIVE CV LAB CUPID;  Service: Cardiovascular;  Laterality: N/A;  . Peripheral vascular catheterization Bilateral 09/15/2014    Procedure: Lower Extremity Angiography;  Surgeon: Elam Dutch, MD;  Location: [place];  Service: Cardiovascular;  Laterality: Bilateral;  . Abdominal hysterectomy      partial  . Amputation Left 01/29/2015    Procedure: Left  AMPUTATION BELOW KNEE;  Surgeon: Elam Dutch, MD;  Location: [place];  Service: Vascular;  Laterality: Left;   Social History:  reports that she has never smoked. She has never used smokeless tobacco. She reports that she does not drink alcohol or use illicit drugs.  Family / Support Systems Marital Status: Widow/Widower How Long?: 4 yrs Patient Roles: Partner, Other (Comment) (grandparent) Children: son, Caitlyne Ingham @ (C) [phone] and dtr-in-law, Anitta Tenny @ (C) [phone] Other Supports: grandson living on pt's "land" as well but working days Anticipated Caregiver: self and family Ability/Limitations of Caregiver: Dtr in Sports coach stays with patient daily Caregiver Availability:  24/7 Family Dynamics: Pt reports that son and dtr-in-law are extremely supportive and involved.   Neoma Laming adds that she has been staying with pt daily since April.  Social History Preferred language: English Religion: None Cultural Background: NA Read: Yes Write: Yes Employment Status: Retired Freight forwarder Issues: None Guardian/Conservator: None - per MD, pt capable of making decisions on her own behalf   Abuse/Neglect Physical Abuse: Denies Verbal Abuse: Denies Sexual Abuse: Denies Exploitation of patient/patient's resources: Denies Self-Neglect: Denies  Emotional Status Pt's affect, behavior adn adjustment status: Pt very pleasant, talkative and notes she is "surprised by how good I'm doing already...".  She becomes tearful when speaking about her husband who died a few years ago and a sister who died this past weekend.  Quickly notes, "I am not usually an emotional person but since this surgery I just seem to cry more..."  She denies any s/s of significant emotional distress.  No s/s of depression or anxiety but will monitor and refer to neuropsychology if formal screen is indicated. Recent Psychosocial Issues: As noted, pt's sister (was in SNF) died this past weekend Pyschiatric History: None Substance Abuse History: None  Patient / Family Perceptions, Expectations & Goals Pt/Family understanding of illness & functional limitations: Pt and family with good understanding of medical issues with wound which resulted in need for BKA.   Good awareness of current functional limitations/ need for CIR. Premorbid pt/family roles/activities: Pt was completely independent, however, no longer driving.  Handle own finances and home management.  Family would provide assistance whenever needed.   Anticipated  changes in roles/activities/participation: Little change anticipated as pt has mod i goals, however, family plans to provide 24/7 assistance upon d/c anyway. Pt/family  expectations/goals: Pt hopes she reaches mod i goals.    Community [employer] Agencies: None Premorbid Home Care/DME Agencies: Other (Comment) (Wound Center x 15 wks) Transportation available at discharge: yes Resource referrals recommended: Support group (specify)  Discharge Planning Living Arrangements: Alone Support Systems: Children, Friends/neighbors Type of Residence: Private residence Insurance Resources: [employer], Multimedia programmer (specify) Sports administrator) Financial Resources: Radio broadcast assistant Screen Referred: No Living Expenses: Own Money Management: Patient Does the patient have any problems obtaining your medications?: No Home Management: pt and family Patient/Family Preliminary Plans: Pt to return to her own home with family to provide 24/7 assist Social Work Anticipated Follow Up Needs: HH/OP Expected length of stay: 7 days  Clinical Impression Pleasant woman here following a BKA and doing well on her first day of therapy.  Excellent family support and can provide 24/7 assist, however, goals being set at mod independent.  Tearful when she talks about family who have recently died but denies any s/s of depression or anxiety.  Will follow for support and d/c planning needs.  [name] 02/01/2015, 11:12 AM

## 2015-02-01 NOTE — Progress Notes (Signed)
[place] PHYSICAL MEDICINE & REHABILITATION     PROGRESS NOTE    Subjective/Complaints: Had a great night sleep. Minimal pain. Did well with therapy session this am  ROS: Pt denies fever, rash/itching, headache, blurred or double vision, nausea, vomiting, abdominal pain, diarrhea, chest pain, shortness of breath, palpitations, dysuria, dizziness, neck or back pain, bleeding, anxiety, or depression   Objective: Vital Signs: Blood pressure 125/65, pulse 81, temperature 99 F (37.2 C), temperature source Oral, resp. rate 18, height 5\' 5"  (1.651 m), weight 60.1 kg (132 lb 7.9 oz), SpO2 98 %. No results found.  Recent Labs  01/31/15 1717 02/01/15 0342  WBC 7.3 5.6  HGB 12.8 12.4  HCT 37.9 36.3  PLT 212 212    Recent Labs  01/31/15 0331 01/31/15 1717 02/01/15 0342  NA 130*  --  130*  K 3.4*  --  3.4*  CL 97*  --  98*  GLUCOSE 120*  --  127*  BUN <5*  --  <5*  CREATININE 0.45 0.46 0.44  CALCIUM 8.3*  --  8.3*   CBG (last 3)  No results for input(s): GLUCAP in the last 72 hours.  Wt Readings from Last 3 Encounters:  01/31/15 60.1 kg (132 lb 7.9 oz)  01/29/15 64.864 kg (143 lb)  01/25/15 65.137 kg (143 lb 9.6 oz)    Physical Exam:  Constitutional: She is oriented to person, place, and time.  HENT: oral mucosa is pink and moist Head: Normocephalic.  Eyes: EOM are normal.  Neck: Normal range of motion. Neck supple. No thyromegaly present.  Cardiovascular: Normal rate and regular rhythm. no murmurs or rubs. Respiratory: Effort normal and breath sounds normal. No respiratory distress.  GI: Soft. Bowel sounds are normal. She exhibits no distension.  Musculoskeletal:    Able to extend knee to -5 degrees today.  Appropriately tender. Edematous distally Neurological: She is alert and oriented to person, place, and time.  UE 5/5 prox to distal. RLE: 4+/5 hf,ke,adf,apf. LLE: 3/5hF, ke 1+. ?mild sensory loss to LT in right foot below ankle.  Skin:  BKA site well  approximated with mild serosang dc, especially centrally Psychiatric: She has a normal mood and affect. Her behavior is normal. Judgment and thought content normal   Assessment/Plan: 1. Functional deficits secondary to left BKA which require 3+ hours per day of interdisciplinary therapy in a comprehensive inpatient rehab setting. Physiatrist is providing close team supervision and 24 hour management of active medical problems listed below. Physiatrist and rehab team continue to assess barriers to discharge/monitor patient progress toward functional and medical goals.  Function:  Bathing Bathing position   Position: Shower  Bathing parts Body parts bathed by patient: Right arm, Left arm, Chest, Abdomen, Right upper leg, Left upper leg, Front perineal area, Buttocks Body parts bathed by helper: Back, Right lower leg  Bathing assist Assist Level: Touching or steadying assistance(Pt > 75%)      Upper Body Dressing/Undressing Upper body dressing   What is the patient wearing?: Pull over shirt/dress, Bra Bra - Perfomed by patient: Thread/unthread right bra strap, Thread/unthread left bra strap, Hook/unhook bra (pull down sports bra)   Pull over shirt/dress - Perfomed by patient: Thread/unthread right sleeve, Thread/unthread left sleeve, Put head through opening, Pull shirt over trunk          Upper body assist Assist Level: Set up      Lower Body Dressing/Undressing Lower body dressing   What is the patient wearing?: Underwear, Pants, Shoes, [employer]  Underwear - Performed by patient: Thread/unthread right underwear leg, Thread/unthread left underwear leg, Pull underwear up/down   Pants- Performed by patient: Thread/unthread right pants leg, Thread/unthread left pants leg, Pull pants up/down           Shoes - Performed by patient: Don/doff right shoe         TED Hose - Performed by helper: Don/doff right TED hose  Lower body assist Assist Level: Touching or steadying  assistance (Pt > 75%)      Toileting Toileting          Toileting assist     Transfers Chair/bed Clinical biochemist          Cognition Comprehension Comprehension assist level: Follows complex conversation/direction with no assist  Expression Expression assist level: Expresses complex ideas: With extra time/assistive device  Social Interaction Social Interaction assist level: Interacts appropriately with others with medication or extra time (anti-anxiety, antidepressant).  Problem Solving Problem solving assist level: Solves complex problems: With extra time  Memory Memory assist level: Complete Independence: No helper   Medical Problem List and Plan: 1. Functional deficits secondary to left BKA 01/29/2015  -beginning therapies today. Highly motivated 2. DVT Prophylaxis/Anticoagulation: Subcutaneous Lovenox. Monitor platelet counts and any signs of bleeding 3. Pain Management: Tylenol No. 3 and Ultram as needed. Really only needing tylenol currently -pt is having mild phantom limb pain, but it remains tolerable at this point 4. Hypertension. Hydrochlorothiazide 12.5 mg daily. normotensive 5. Neuropsych: This patient is capable of making decisions on her own behalf. 6. Skin/Wound Care: continued dry dressing with ACE---I personally examined and redressed wound today 7. Fluids/Electrolytes/Nutrition: has good appetite. Add k+ replacement while here -mild hyponatremia---stable at 130- can recheck next week 8. Prosthetics: continue ACE wrap for left stump -prosthetic education this week -consider shrinker sock depending upon appearance of the wound LOS (Days) 1 A FACE TO FACE EVALUATION WAS PERFORMED  SWARTZ,ZACHARY T 02/01/2015 8:34 AM

## 2015-02-01 NOTE — Care Management Note (Signed)
[facility] Individual Statement of Services  Patient Name:  [name]  Date:  02/01/2015  Welcome to the [facility].  Our goal is to provide you with an individualized program based on your diagnosis and situation, designed to meet your specific needs.  With this comprehensive rehabilitation program, you will be expected to participate in at least 3 hours of rehabilitation therapies Monday-Friday, with modified therapy programming on the weekends.  Your rehabilitation program will include the following services:  Physical Therapy (PT), Occupational Therapy (OT), 24 hour per day rehabilitation nursing, Therapeutic Recreaction (TR), Case Management (Social Worker), Rehabilitation Medicine, Nutrition Services and Pharmacy Services  Weekly team conferences will be held on Tuesdays to discuss your progress.  Your Social Worker will talk with you frequently to get your input and to update you on team discussions.  Team conferences with you and your family in attendance may also be held.  Expected length of stay: 7 days  Overall anticipated outcome: modified independent  Depending on your progress and recovery, your program may change. Your Social Worker will coordinate services and will keep you informed of any changes. Your Social Worker's name and contact numbers are listed  below.  The following services may also be recommended but are not provided by the [facility]:    [facility] will be made to provide these services after discharge if needed.  Arrangements include referral to agencies that provide these services.  Your insurance has been verified to be:  Medicare and [place] Your primary doctor is:  Silvestre Mesi  Pertinent information will be shared with your doctor and your insurance company.  Social Worker:  [place], [place] or  ([phone]   Information discussed with and copy given to patient by: Lennart Pall, 02/01/2015, 10:31 AM

## 2015-02-01 NOTE — Progress Notes (Signed)
Patient information reviewed and entered into eRehab system by Marie Noel, RN, CRRN, PPS Coordinator.  Information including medical coding and functional independence measure will be reviewed and updated through discharge.     Per nursing patient was given "Data Collection Information Summary for Patients in Inpatient Rehabilitation Facilities with attached "Privacy Act Statement-Health Care Records" upon admission.  

## 2015-02-01 NOTE — Evaluation (Signed)
Occupational Therapy Assessment and Plan  Patient Details  Name: [name] MRN: [id number] Date of Birth: 05/13/1931  OT Diagnosis: abnormal posture, acute pain and muscle weakness (generalized) Rehab Potential: Rehab Potential (ACUTE ONLY): Good ELOS: 7 days   Today's Date: 02/01/2015 OT Individual Time: 0700-0800 and 1022-1100 OT Individual Time Calculation (min): 60 min and 38 min     Problem List:  Patient Active Problem List   Diagnosis Date Noted  . Status post below knee amputation of left lower extremity 01/31/2015  . Non-healing wound of lower extremity 01/29/2015  . Ulcer of toe of left foot 12/27/2014  . Essential hypertension 08/22/2014    Past Medical History:  Past Medical History  Diagnosis Date  . Hypertension   . PAD (peripheral artery disease)   . Arthritis   . Cancer     utertrine   Past Surgical History:  Past Surgical History  Procedure Laterality Date  . Spine surgery    . Knee surgery Right 1984  . Peripheral vascular catheterization N/A 09/15/2014    Procedure: Abdominal Aortogram;  Surgeon: Elam Dutch, MD;  Location: [facility] INVASIVE CV LAB CUPID;  Service: Cardiovascular;  Laterality: N/A;  . Peripheral vascular catheterization Bilateral 09/15/2014    Procedure: Lower Extremity Angiography;  Surgeon: Elam Dutch, MD;  Location: [facility];  Service: Cardiovascular;  Laterality: Bilateral;  . Abdominal hysterectomy      partial  . Amputation Left 01/29/2015    Procedure: Left  AMPUTATION BELOW KNEE;  Surgeon: Elam Dutch, MD;  Location: [facility] OR;  Service: Vascular;  Laterality: Left;    Assessment & Plan Clinical Impression: Patient is a 81 y.o. year old female with history of hypertension, peripheral vascular disease. Patient had been living alone prior to admission with assistance from son and daughter-in-law. Presented 01/29/2015 with nonhealing left foot wound. No relief with conservative care and progressive  ischemic changes. 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 Oneida Alar. Hospital course pain management. Subcutaneous Lovenox for DVT prophylaxis. Physical therapy evaluation completed 01/30/2015. M.D. has requested physical medicine rehabilitation consult. Patient was admitted for a comprehensive rehabilitation program .  Patient transferred to CIR on 01/31/2015 .    Patient currently requires min with basic self-care skills secondary to muscle weakness, decreased cardiorespiratoy endurance and decreased sitting balance, decreased standing balance and decreased balance strategies.  Prior to hospitalization, patient could complete ADLs and IADLs with modified independent .  Patient will benefit from skilled intervention to increase independence with basic self-care skills prior to discharge home with care partner.  Anticipate patient will require intermittent supervision and follow up home health.  OT - End of Session Activity Tolerance: Decreased this session Endurance Deficit: Yes OT Assessment Rehab Potential (ACUTE ONLY): Good Barriers to Discharge: Other (comment) Barriers to Discharge Comments: none known at this time OT Patient demonstrates impairments in the following area(s): Balance;Endurance;Motor;Safety;Pain OT Basic ADL's Functional Problem(s): Grooming;Bathing;Dressing;Toileting OT Advanced ADL's Functional Problem(s): Simple Meal Preparation;Laundry OT Transfers Functional Problem(s): Toilet;Tub/Shower OT Additional Impairment(s): None OT Plan OT Intensity: Minimum of 1-2 x/day, 45 to 90 minutes OT Frequency: 5 out of 7 days OT Duration/Estimated Length of Stay: 7 days OT Treatment/Interventions: Balance/vestibular training;Community reintegration;Patient/family education;Self Care/advanced ADL retraining;Therapeutic Exercise;UE/LE Coordination activities;Wheelchair propulsion/positioning;Discharge planning;DME/adaptive equipment instruction;Functional mobility training;Psychosocial  support;Pain management;Therapeutic Activities;UE/LE Strength taining/ROM OT Self Feeding Anticipated Outcome(s): n/a OT Basic Self-Care Anticipated Outcome(s): Mod I - supervision OT Toileting Anticipated Outcome(s): Mod I  OT Bathroom Transfers Anticipated Outcome(s): Mod I - toilet  and supervision - shower transfer OT Recommendation Recommendations for Other Services: Neuropsych consult Patient destination: Home Follow Up Recommendations: Home health OT Equipment Details: pt has all needed equipment   Skilled Therapeutic Intervention Session 1:Upon entering the room, pt seated on EOB with 4/10 c/o pain in L LE stump. RN recently provided medication prior to therapist arrival. Pt very excited and motivated for shower this morning. OT educated pt on OT purpose, POC, and goals with pt verbalizing understanding and agreement. Pt engaged in hop on R LE with use of RW and steady assist 20' into bathroom for shower transfer. Pt seated on shower chair for bathing this session with min verbal cues for safety in shower and steady assist when standing to wash buttocks and peri area. Pt returning to sit on EOB for dressing tasks and daughter in law present at this point. Pt practiced lateral leans for LB clothing management with min verbal cues from therapist but ultimately stood to pull over B hips. Pt sitting in recliner chair for breakfast with call bell and all needed items within reach upon exiting the room.   Session 2: Upon entering the room, pt seated in recliner chair with daughter in law present in room. Pt ambulated with use of RW ~ 100' with steady assist to ADL apartment. Pt sitting on low, soft, plush sofa and required min guard for sit <>stand from surface. Pt then ambulating in same manner into bathroom for transfer onto TTB with use of RW. Pt required min verbal cues for proper technique. OT recommended safety treads for bathroom at home in order to increase safety and decrease fall risk. Pt  verbalized understanding. Pt becoming very emotional this session and OT recommended neuropsych evaluation. Pt agrees that she has been upset lately and feels overwhelmed with recent events. Pt too fatigue to ambulate back to room at end of session. OT assisted pt back to room via wheelchair. Pt remained in chair and all needed items within reach upon exiting the room.   OT Evaluation Precautions/Restrictions  Precautions Precautions: Fall Restrictions Weight Bearing Restrictions: Yes LLE Weight Bearing: Non weight bearing Vital Signs Therapy Vitals Temp: 99 F (37.2 C) Temp Source: Oral Pulse Rate: 81 Resp: 18 BP: 125/65 mmHg Patient Position (if appropriate): Lying Oxygen Therapy SpO2: 98 % O2 Device: Not Delivered Pain Pain Assessment Pain Assessment: 0-10 Pain Score: 4  Pain Type: Surgical pain Pain Location: Leg Pain Orientation: Left Pain Descriptors / Indicators: Aching;Discomfort Pain Onset: Gradual Patients Stated Pain Goal: 0 Pain Intervention(s): Repositioned Multiple Pain Sites: No Home Living/Prior Functioning Home Living Available Help at Discharge: Family (daughter in law and son present at eval and will be staying with her 24/7 at discharge) Type of Home: House Home Access: Stairs to enter [employer] of Steps: 2 but they are currently building a ramp Home Layout: Two level, Able to live on main level with bedroom/bathroom Bathroom Shower/Tub: Tub/shower unit, Architectural technologist: Standard Bathroom Accessibility: Yes  Lives With: Alone, Other (Comment) (Pt lives alone but family has been staying with her intermittently during the day and every night PTA) IADL History Occupation: Retired Prior Function Level of Independence: Independent with homemaking with ambulation, Independent with basic ADLs, Independent with gait, Independent with transfers  Able to Take Stairs?: Yes Comments: Has been sponge bathing since April as she is scared of  falling in the shower. Uses [facility] in the community for assist. Pt reports she has been working at both upper and lower body strengthening  prior to surgery.  Vision/Perception  Vision- History Baseline Vision/History: Wears glasses Wears Glasses: Reading only Patient Visual Report: No change from baseline Vision- Assessment Vision Assessment?: No apparent visual deficits  Cognition Overall Cognitive Status: Within Functional Limits for tasks assessed Arousal/Alertness: Awake/alert Orientation Level: Person;Place;Situation Person: Oriented Place: Oriented Situation: Oriented Year: 2016 Month: September Day of Week: Correct Memory: Appears intact Immediate Memory Recall: Sock;Blue;Bed Memory Recall: Sock;Blue;Bed Memory Recall Sock: Without Cue Memory Recall Blue: Without Cue Memory Recall Bed: Without Cue Attention: Alternating Problem Solving: Appears intact Safety/Judgment: Appears intact Sensation Sensation Light Touch: Appears Intact Stereognosis: Not tested Hot/Cold: Appears Intact Proprioception: Appears Intact Coordination Gross Motor Movements are Fluid and Coordinated: No Fine Motor Movements are Fluid and Coordinated: Yes Finger Nose Finger Test: no dysmetria noted, minimally decreased speed Heel Shin Test: not tested Motor  Motor Motor: Abnormal postural alignment and control;Other (comment) (generalized weakness) Mobility  Transfers Sit to Stand: 4: Min guard Stand to Sit: 4: Min guard  Trunk/Postural Assessment  Cervical Assessment Cervical Assessment: Exceptions to [facility] (rounded shoulders) Thoracic Assessment Thoracic Assessment: Within Functional Limits Lumbar Assessment Lumbar Assessment: Within Functional Limits Postural Control Postural Control: Deficits on evaluation Righting Reactions: decreased when assessed in standing requiring min A to return to midline  Balance Balance Balance Assessed: Yes Dynamic Sitting Balance Dynamic Sitting -  Balance Support: Right upper extremity supported;Left upper extremity supported;Feet supported Dynamic Sitting - Level of Assistance: 6: Modified independent (Device/Increase time) Dynamic Sitting - Balance Activities: Forward lean/weight shifting;Reaching for objects;Reaching across midline Static Standing Balance Static Standing - Balance Support: Right upper extremity supported;Left upper extremity supported Static Standing - Level of Assistance: 5: Stand by assistance Dynamic Standing Balance Dynamic Standing - Balance Support: Right upper extremity supported;Left upper extremity supported Dynamic Standing - Level of Assistance: 4: Min assist Dynamic Standing - Balance Activities: Forward lean/weight shifting;Reaching for objects;Reaching across midline Extremity/Trunk Assessment RUE Assessment RUE Assessment: Exceptions to [facility] (4/5 grossly throughout) LUE Assessment LUE Assessment: Exceptions to [facility] (4/5 grossly throughout)   See Function Navigator for Current Functional Status.   Refer to Care Plan for Long Term Goals  Recommendations for other services: Neuropsych  Discharge Criteria: Patient will be discharged from OT if patient refuses treatment 3 consecutive times without medical reason, if treatment goals not met, if there is a change in medical status, if patient makes no progress towards goals or if patient is discharged from hospital.  The above assessment, treatment plan, treatment alternatives and goals were discussed and mutually agreed upon: by patient  Phineas Semen 02/01/2015, 8:21 AM

## 2015-02-02 ENCOUNTER — Inpatient Hospital Stay (HOSPITAL_COMMUNITY): Payer: Medicare Other | Admitting: Occupational Therapy

## 2015-02-02 ENCOUNTER — Inpatient Hospital Stay (HOSPITAL_COMMUNITY): Payer: Medicare Other | Admitting: Physical Therapy

## 2015-02-02 NOTE — Progress Notes (Signed)
[facility] PHYSICAL MEDICINE & REHABILITATION     PROGRESS NOTE    Subjective/Complaints: Had a good day with rehab. Right leg a little sore from exercise but otherwise feeling very well.   ROS: Pt denies fever, rash/itching, headache, blurred or double vision, nausea, vomiting, abdominal pain, diarrhea, chest pain, shortness of breath, palpitations, dysuria, dizziness, neck or back pain, bleeding, anxiety, or depression   Objective: Vital Signs: Blood pressure 115/59, pulse 77, temperature 98.1 F (36.7 C), temperature source Oral, resp. rate 18, height 5\' 5"  (1.651 m), weight 60.1 kg (132 lb 7.9 oz), SpO2 99 %. No results found.  Recent Labs  01/31/15 1717 02/01/15 0342  WBC 7.3 5.6  HGB 12.8 12.4  HCT 37.9 36.3  PLT 212 212    Recent Labs  01/31/15 0331 01/31/15 1717 02/01/15 0342  NA 130*  --  130*  K 3.4*  --  3.4*  CL 97*  --  98*  GLUCOSE 120*  --  127*  BUN <5*  --  <5*  CREATININE 0.45 0.46 0.44  CALCIUM 8.3*  --  8.3*   CBG (last 3)  No results for input(s): GLUCAP in the last 72 hours.  Wt Readings from Last 3 Encounters:  01/31/15 60.1 kg (132 lb 7.9 oz)  01/29/15 64.864 kg (143 lb)  01/25/15 65.137 kg (143 lb 9.6 oz)    Physical Exam:  Constitutional: She is oriented to person, place, and time.  HENT: oral mucosa is pink and moist Head: Normocephalic.  Eyes: EOM are normal.  Neck: Normal range of motion. Neck supple. No thyromegaly present.  Cardiovascular: Normal rate and regular rhythm. no murmurs or rubs. Respiratory: Effort normal and breath sounds normal. No respiratory distress.  GI: Soft. Bowel sounds are normal. She exhibits no distension.  Musculoskeletal:    Able to extend knee to almost full today.  Appropriately tender. Edematous distally Neurological: She is alert and oriented to person, place, and time.  UE 5/5 prox to distal. RLE: 4+/5 hf,ke,adf,apf. LLE: 3/5hF, ke 1+. ?mild sensory loss to LT in right foot below ankle.   Skin:  BKA site well approximated with mild serosang dc,  Psychiatric: She has a normal mood and affect. Her behavior is normal. Judgment and thought content normal   Assessment/Plan: 1. Functional deficits secondary to left BKA which require 3+ hours per day of interdisciplinary therapy in a comprehensive inpatient rehab setting. Physiatrist is providing close team supervision and 24 hour management of active medical problems listed below. Physiatrist and rehab team continue to assess barriers to discharge/monitor patient progress toward functional and medical goals.  Function:  Bathing Bathing position   Position: Shower  Bathing parts Body parts bathed by patient: Right arm, Left arm, Chest, Abdomen, Right upper leg, Left upper leg, Front perineal area, Buttocks Body parts bathed by helper: Back, Right lower leg  Bathing assist Assist Level: Touching or steadying assistance(Pt > 75%)      Upper Body Dressing/Undressing Upper body dressing   What is the patient wearing?: Pull over shirt/dress, Bra Bra - Perfomed by patient: Thread/unthread right bra strap, Thread/unthread left bra strap, Hook/unhook bra (pull down sports bra)   Pull over shirt/dress - Perfomed by patient: Thread/unthread right sleeve, Thread/unthread left sleeve, Put head through opening, Pull shirt over trunk          Upper body assist Assist Level: Set up      Lower Body Dressing/Undressing Lower body dressing   What is the patient wearing?: Underwear,  Pants, Shoes, [employer]  - Performed by patient: Thread/unthread right underwear leg, Thread/unthread left underwear leg, Pull underwear up/down   Pants- Performed by patient: Thread/unthread right pants leg, Thread/unthread left pants leg, Pull pants up/down           Shoes - Performed by patient: Don/doff right shoe         TED Hose - Performed by helper: Don/doff right TED hose  Lower body assist Assist Level: Touching or steadying  assistance (Pt > 75%)      Toileting Toileting   Toileting steps completed by patient: Adjust clothing prior to toileting, Performs perineal hygiene, Adjust clothing after toileting   Toileting Assistive Devices: Grab bar or rail  Toileting assist Assist level: Supervision or verbal cues   Transfers Chair/bed transfer   Chair/bed transfer method: Ambulatory Chair/bed transfer assist level: Touching or steadying assistance (Pt > 75%) Chair/bed transfer assistive device: Medical sales representative     Max distance: 100 Assist level: Touching or steadying assistance (Pt > 75%)   Wheelchair   Type: Manual Max wheelchair distance: 150 Assist Level: Supervision or verbal cues  Cognition Comprehension Comprehension assist level: Follows complex conversation/direction with no assist  Expression Expression assist level: Expresses complex ideas: With extra time/assistive device  Social Interaction Social Interaction assist level: Interacts appropriately with others with medication or extra time (anti-anxiety, antidepressant).  Problem Solving Problem solving assist level: Solves complex problems: With extra time  Memory Memory assist level: Complete Independence: No helper   Medical Problem List and Plan: 1. Functional deficits secondary to left BKA 01/29/2015  -progressing nicely in therapy 2. DVT Prophylaxis/Anticoagulation: Subcutaneous Lovenox. Monitor platelet counts and any signs of bleeding 3. Pain Management: Tylenol No. 3 and Ultram as needed. Really only needing tylenol currently -pt with only mild phantom limb pain, but it remains tolerable at this point 4. Hypertension. Hydrochlorothiazide 12.5 mg daily. normotensive 5. Neuropsych: This patient is capable of making decisions on her own behalf. 6. Skin/Wound Care: continued dry dressing with ACE---I  7. Fluids/Electrolytes/Nutrition: has good appetite. Add k+ replacement while here -mild  hyponatremia---stable at 130- can recheck Monday 8. Prosthetics: continue ACE wrap for left stump -prosthetic education today -consider shrinker sock depending upon appearance of the wound LOS (Days) 2 A FACE TO FACE EVALUATION WAS PERFORMED  SWARTZ,ZACHARY T 02/02/2015 8:30 AM

## 2015-02-02 NOTE — Progress Notes (Signed)
Occupational Therapy Session Note  Patient Details  Name: [name] MRN: [id number] Date of Birth: [birth year]  Today's Date: 02/02/2015 OT Individual Time: 0900-1000 OT Individual Time Calculation (min): 60 min    Short Term Goals: Week 1:  OT Short Term Goal 1 (Week 1): STGs=LTGS secondary to estimated short LOS  Skilled Therapeutic Interventions/Progress Updates:  Upon entering the room, pt supine in bed with 4/10 c/o pain this session and pt calling for pain medication this session as well. Her daughter in law present this session for observation. Pt declined shower this session and requested bathing and dressing while seated EOB. Pt requiring set up and min verbal cues for proper technique for self care this session. Heavy discussion and practice with lateral leans for LB clothing management. OT also provided education regarding energy conservation with self care activities and general principles of conservation with pt verbalizing understanding. Pt standing at sink side with min A for balance while having 1 or no ( at times) UE support while brushing teeth. Pt then sitting in chair and performing 2 sets of 10 wheelchair push ups for increased B UE strength. Pt remain seated in chair with call bell and all needed items within reach upon exiting the room.   Therapy Documentation Precautions:  Precautions Precautions: Fall Precaution Comments: new left BKA Restrictions Weight Bearing Restrictions: Yes LLE Weight Bearing: Non weight bearing   Pain: Pain Assessment Pain Assessment: No/denies pain Pain Score: 1   See Function Navigator for Current Functional Status.   Therapy/Group: Individual Therapy  Phineas Semen 02/02/2015, 12:39 PM

## 2015-02-02 NOTE — Progress Notes (Signed)
Orthopedic Tech Progress Note Patient Details:  [name] 1932/01/05 [id number]  Patient ID: [name], female   DOB: 27-Jun-1931, 81 y.o.   MRN: [id number] Called in advanced brace order; spoke with Evangeline Dakin, [place] 02/02/2015, 9:44 AM

## 2015-02-02 NOTE — Progress Notes (Signed)
[facility]  Subjective  - feels ok   Objective 115/59 77 98.1 F (36.7 C) (Oral) 18 99%  Intake/Output Summary (Last 24 hours) at 02/02/15 1144 Last data filed at 02/02/15 0817  Gross per 24 hour  Intake    360 ml  Output      0 ml  Net    360 ml   Left BKA incision intact some serous drainage from edema  Assessment/Planning: Healing left BKA will recheck next week  [name] 02/02/2015 11:44 AM --  Laboratory Lab Results:  Recent Labs  01/31/15 1717 02/01/15 0342  WBC 7.3 5.6  HGB 12.8 12.4  HCT 37.9 36.3  PLT 212 212   BMET  Recent Labs  01/31/15 0331 01/31/15 1717 02/01/15 0342  NA 130*  --  130*  K 3.4*  --  3.4*  CL 97*  --  98*  CO2 25  --  25  GLUCOSE 120*  --  127*  BUN <5*  --  <5*  CREATININE 0.45 0.46 0.44  CALCIUM 8.3*  --  8.3*    COAG No results found for: INR, PROTIME No results found for: PTT

## 2015-02-02 NOTE — Progress Notes (Signed)
Physical Therapy Session Note  Patient Details  Name: [name] MRN: [id number] Date of Birth: 1931-08-22  Today's Date: 02/02/2015 PT Individual Time: 1000-1100 and 1430-1600  PT Individual Time Calculation (min): 60 min and 90 min   Short Term Goals: Week 1:  PT Short Term Goal 1 (Week 1): STGs=LTGs due to short ELOS  Skilled Therapeutic Interventions/Progress Updates:    Session 1: Pt received in straight back chair and agreeable to therapy. RN in to distribute meds so PT instructed patient in seated therex 2x15 reps of heel/toe raises (R only), LAQ, and seated marching. PT instructed patient in amb with RW x30' with supervision and cues for use of UEs during swing phase. Pt requesting to sit due to fatigue after 30'.  Pt propelled w/c to therapy gym and transferred to therapy mat with RW and supervision. PT instructed patient in BLE therex in supine position 2x15 heel slides (R only), SLR, hip abd/add to midline, glute sets, quad sets, and isometric hip extension (L only).  Pt transferred back to w/c with RW and supervision and propelled back to room at end of session. Pt positioned in recliner with call bell in reach and needs met.   Session 2: Pt received in recliner and agreeable to therapy session.  Session focused on community level w/c propulsion, w/c parts management, and strengthening/endurance.  Pt propelled w/c to therapy gym and down to lobby, outside, and through gift shop.  Pt is able to propel w/c 150 ft. or more and is able to turn around, maneuver to table, bed or toilet, maneuver on rugs and over door sills, and can negotiate a 3% grade modified independent.  PT instructed pt in BLE therex sitting in w/c x20 reps of LAQ (R only), seated marching, and hip abd/add and 5 min of AROM for L knee.  Pt returned to gym after w/c training and performed 10 min on Nustep at level 4 for endurance and LE strengthening.  Pt's daughter in law, Jackelyn Poling, instructed in and signed off on  transfers with patient, including ambulating patient to bathroom for toileting.  RN/NT notified.  Pt positioned in w/c at end of session with needs met.    Therapy Documentation Precautions:  Precautions Precautions: Fall Precaution Comments: new left BKA Restrictions Weight Bearing Restrictions: Yes LLE Weight Bearing: Non weight bearing Pain: Pain Assessment Pain Assessment: No/denies pain   See Function Navigator for Current Functional Status.   Therapy/Group: Individual Therapy  Caitlin E Penven-Crew 02/02/2015, 12:12 PM

## 2015-02-03 ENCOUNTER — Inpatient Hospital Stay (HOSPITAL_COMMUNITY): Payer: Medicare Other | Admitting: Physical Therapy

## 2015-02-03 ENCOUNTER — Inpatient Hospital Stay (HOSPITAL_COMMUNITY): Payer: Medicare Other | Admitting: Occupational Therapy

## 2015-02-03 NOTE — Progress Notes (Signed)
Occupational Therapy Session Note  Patient Details  Name: [name] MRN: [id number] Date of Birth: 28-May-1931  Today's Date: 02/03/2015 OT Individual Time: 8250-5397 OT Individual Time Calculation (min): 89min    Short Term Goals: Week 1:  OT Short Term Goal 1 (Week 1): STGs=LTGS secondary to estimated short LOS Week 2:     Skilled Therapeutic Interventions/Progress Updates:    Pt. Sitting in recliner with daug- n- law present.  Pt transferred from recliner to wc with close CGA.  Educated daughter- n- law, on  wc leg rest and how to to fit and remove.   Pt. Transferred from recliner to wc with cues for proper technique.  Pr  Propelled  wc to gym.  Did education on proper set up for transfer from wc to mat.  Did exercises on mat for pt stated she wanted to work on LE.     Did hip flexion, abduction, knee to chest,; right hip bridging; Performed hip extension and knee flexion in prone position.  Pt. Performed exercises in side liying.  Transferred back to wc and propelled wc back to room with family present.      Therapy Documentation Precautions:  Precautions Precautions: Fall Precaution Comments: new left BKA Restrictions Weight Bearing Restrictions: Yes LLE Weight Bearing: Non weight bearing General:   Vital Signs: Therapy Vitals Temp: 98.3 F (36.8 C) Temp Source: Oral Pulse Rate: 87 Resp: 18 BP: 129/79 mmHg Patient Position (if appropriate): Sitting Oxygen Therapy SpO2: 100 % O2 Device: Not Delivered Pain: none         Other Treatments:    See Function Navigator for Current Functional Status.   Therapy/Group: Individual Therapy  Lisa Roca 02/03/2015, 4:20 PM

## 2015-02-03 NOTE — Progress Notes (Signed)
Occupational Therapy Session Note  Patient Details  Name: [name] MRN: [id number] Date of Birth: 01-07-1932  Today's Date: 02/03/2015 OT Individual Time: 5631-4970 and 2637- 1500 OT Individual Time Calculation (min): 57 min and 45 min    Short Term Goals: Week 1:  OT Short Term Goal 1 (Week 1): STGs=LTGS secondary to estimated short LOS  Skilled Therapeutic Interventions/Progress Updates:  Session 1: Upon entering the room, pt seated in wheelchair and awaiting therapist. Pt with no c/o pain this session. Pt ambulating into bathroom with close supervision for toileting. Pt utilizing elevated toilet seat and walker for toilet transfer with steady assist. Pt performed clothing management and hygiene while seated with supervision. Pt engaged in bathing at shower level with supervision only and she performed lateral leans to wash buttocks and peri area. Pt returning to sit EOB for dressing tasks. Pt stood at sink side to brush teeth with L UE supported and close supervision for balance. Pt requesting to sit in recliner chair at end of session as her daughter in law was present for observation during this session. Call bell and all needed items within reach upon exiting the room.    Session 2: Upon entering the room, pt seated in wheelchair awaiting therapist with 6/10 c/o throbbing in L stump during session. Min verbal cues for tapping and rubbing in order to decrease pain. Pt propelled wheelchair 250' towards day room independently. Pt required assist to manage leg rest before sit <>stand this session. Pt standing for 12 minutes this session during table top card activity. Pt required supervision with 1 UE supported and min - mod A for balance without either UE supported. Pt requiring rest break after standing for this period of time. Pt very emotional this afternoon since her granddaughter arrived for visit. Pt is motivate but admits to feeling overwhelmed at times. Pt also reporting fatigue  from sessions in which OT provided education regarding general principles of energy conservation. Pt verbalized understanding but education to continue. Pt propelled wheelchair back to room in same manner as stated above. Pt transferred stand pivot with supervision into recliner chair with L LE elevated. Call bell and all needed items within reach upon exiting the room.   Therapy Documentation Precautions:  Precautions Precautions: Fall Precaution Comments: new left BKA Restrictions Weight Bearing Restrictions: Yes LLE Weight Bearing: Non weight bearing  See Function Navigator for Current Functional Status.   Therapy/Group: Individual Therapy  Phineas Semen 02/03/2015, 10:00 AM

## 2015-02-03 NOTE — Progress Notes (Signed)
[place] PHYSICAL MEDICINE & REHABILITATION     PROGRESS NOTE    Subjective/Complaints: Sore on left side from therapies. Very pleased with progress.   ROS: Pt denies fever, rash/itching, headache, blurred or double vision, nausea, vomiting, abdominal pain, diarrhea, chest pain, shortness of breath, palpitations, dysuria, dizziness, neck or back pain, bleeding, anxiety, or depression   Objective: Vital Signs: Blood pressure 129/75, pulse 84, temperature 98.7 F (37.1 C), temperature source Oral, resp. rate 18, height 5\' 5"  (1.651 m), weight 60.1 kg (132 lb 7.9 oz), SpO2 97 %. No results found.  Recent Labs  01/31/15 1717 02/01/15 0342  WBC 7.3 5.6  HGB 12.8 12.4  HCT 37.9 36.3  PLT 212 212    Recent Labs  01/31/15 1717 02/01/15 0342  NA  --  130*  K  --  3.4*  CL  --  98*  GLUCOSE  --  127*  BUN  --  <5*  CREATININE 0.46 0.44  CALCIUM  --  8.3*   CBG (last 3)  No results for input(s): GLUCAP in the last 72 hours.  Wt Readings from Last 3 Encounters:  01/31/15 60.1 kg (132 lb 7.9 oz)  01/29/15 64.864 kg (143 lb)  01/25/15 65.137 kg (143 lb 9.6 oz)    Physical Exam:  Constitutional: She is oriented to person, place, and time.  HENT: oral mucosa is pink and moist Head: Normocephalic.  Eyes: EOM are normal.  Neck: Normal range of motion. Neck supple. No thyromegaly present.  Cardiovascular: Normal rate and regular rhythm. no murmurs or rubs. Respiratory: Effort normal and breath sounds normal. No respiratory distress.  GI: Soft. Bowel sounds are normal. She exhibits no distension.  Musculoskeletal:   edema decreased at stump--good shape Neurological: She is alert and oriented to person, place, and time.  UE 5/5 prox to distal. RLE: 4+/5 hf,ke,adf,apf. LLE: 3/5hF, ke 1+. ?mild sensory loss to LT in right foot below ankle.  Skin:  BKA site well approximated with mild serosang dc,  Psychiatric: She has a normal mood and affect. Her behavior is  normal. Judgment and thought content normal   Assessment/Plan: 1. Functional deficits secondary to left BKA which require 3+ hours per day of interdisciplinary therapy in a comprehensive inpatient rehab setting. Physiatrist is providing close team supervision and 24 hour management of active medical problems listed below. Physiatrist and rehab team continue to assess barriers to discharge/monitor patient progress toward functional and medical goals.  Function:  Bathing Bathing position   Position: Sitting EOB  Bathing parts Body parts bathed by patient: Right arm, Left arm, Chest, Abdomen, Right upper leg, Left upper leg, Front perineal area, Buttocks, Right lower leg Body parts bathed by helper: Back  Bathing assist Assist Level: Set up, Supervision or verbal cues   Set up : To obtain items  Upper Body Dressing/Undressing Upper body dressing   What is the patient wearing?: Pull over shirt/dress, Bra Bra - Perfomed by patient: Thread/unthread right bra strap, Thread/unthread left bra strap, Hook/unhook bra (pull down sports bra)   Pull over shirt/dress - Perfomed by patient: Thread/unthread right sleeve, Thread/unthread left sleeve, Put head through opening, Pull shirt over trunk          Upper body assist Assist Level: Set up      Lower Body Dressing/Undressing Lower body dressing   What is the patient wearing?: Underwear, Pants, Shoes, [employer]  - Performed by patient: Thread/unthread right underwear leg, Thread/unthread left underwear leg, Pull underwear up/down  Pants- Performed by patient: Thread/unthread right pants leg, Thread/unthread left pants leg, Pull pants up/down           Shoes - Performed by patient: Don/doff right shoe       TED Hose - Performed by patient: Don/doff right TED hose TED Hose - Performed by helper: Don/doff right TED hose  Lower body assist Assist Level: Set up, Supervision or verbal cues      Toileting Toileting    Toileting steps completed by patient: Adjust clothing prior to toileting, Performs perineal hygiene, Adjust clothing after toileting   Toileting Assistive Devices: Grab bar or rail  Toileting assist Assist level: Touching or steadying assistance (Pt.75%)   Transfers Chair/bed transfer   Chair/bed transfer method: Ambulatory Chair/bed transfer assist level: Supervision or verbal cues Chair/bed transfer assistive device: Medical sales representative     Max distance: 30 Assist level: Touching or steadying assistance (Pt > 75%)   Wheelchair   Type: Manual Max wheelchair distance: >300 Assist Level: No help, No cues, assistive device, takes more than reasonable amount of time  Cognition Comprehension Comprehension assist level: Follows complex conversation/direction with no assist  Expression Expression assist level: Expresses complex ideas: With extra time/assistive device  Social Interaction Social Interaction assist level: Interacts appropriately with others with medication or extra time (anti-anxiety, antidepressant).  Problem Solving Problem solving assist level: Solves complex problems: With extra time  Memory Memory assist level: Complete Independence: No helper   Medical Problem List and Plan: 1. Functional deficits secondary to left BKA 01/29/2015  -progressing nicely in therapy 2. DVT Prophylaxis/Anticoagulation: Subcutaneous Lovenox. Monitor platelet counts and any signs of bleeding 3. Pain Management: Tylenol No. 3 and Ultram as needed. Really only needing tylenol currently -pt with only mild phantom limb pain, but it remains tolerable at this point 4. Hypertension. Hydrochlorothiazide 12.5 mg daily. normotensive 5. Neuropsych: This patient is capable of making decisions on her own behalf. 6. Skin/Wound Care: continued dry dressing with ACE---I  7. Fluids/Electrolytes/Nutrition: has good appetite. Add k+ replacement while here -mild  hyponatremia---stable at 130-  recheck Monday 8. Prosthetics: continue ACE wrap for left stump -prosthetic education performed -consider shrinker sock depending upon appearance of the wound LOS (Days) 3 A FACE TO FACE EVALUATION WAS PERFORMED  SWARTZ,ZACHARY T 02/03/2015 8:54 AM

## 2015-02-03 NOTE — IPOC Note (Signed)
Overall Plan of Care [facility]) Patient Details Name: [name] MRN: [id number] DOB: February 05, 1932  Admitting Diagnosis: L BKA  Hospital Problems: Principal Problem:   Status post below knee amputation of left lower extremity Active Problems:   Essential hypertension     Functional Problem List: Nursing Edema, Medication Management, Pain, Safety, Skin Integrity  PT Balance, Motor, Endurance, Pain, Safety  OT Balance, Endurance, Motor, Safety, Pain  SLP    TR         Basic ADL's: OT Grooming, Bathing, Dressing, Toileting     Advanced  ADL's: OT Simple Meal Preparation, Laundry     Transfers: PT Bed Mobility, Bed to Chair, Car, Manufacturing systems engineer, Metallurgist: PT Ambulation, Emergency planning/management officer, Stairs     Additional Impairments: OT None  SLP        TR      Anticipated Outcomes Item Anticipated Outcome  Self Feeding n/a  Swallowing      Basic self-care  Mod I - supervision  Toileting  Mod I    Bathroom Transfers Mod I - toilet and supervision - shower transfer  Bowel/Bladder  manage bowel and bladder Mod I  Transfers  mod I  Locomotion  supervision  Communication     Cognition     Pain  3 or less  Safety/Judgment  Mod I   Therapy Plan: PT Intensity: Minimum of 1-2 x/day ,45 to 90 minutes PT Frequency: 5 out of 7 days PT Duration Estimated Length of Stay: 7 days OT Intensity: Minimum of 1-2 x/day, 45 to 90 minutes OT Frequency: 5 out of 7 days OT Duration/Estimated Length of Stay: 7 days         Team Interventions: Nursing Interventions Patient/Family Education, Disease Management/Prevention, Pain Management, Medication Management, Skin Care/Wound Management  PT interventions Ambulation/gait training, Training and development officer, Community reintegration, Discharge planning, DME/adaptive equipment instruction, Functional mobility training, Neuromuscular re-education, Pain management, Patient/family education, Psychosocial  support, Stair training, Therapeutic Activities, Therapeutic Exercise, UE/LE Strength taining/ROM, UE/LE Coordination activities, Wheelchair propulsion/positioning  OT Interventions Training and development officer, Academic librarian, Barrister's clerk education, Self Care/advanced ADL retraining, Therapeutic Exercise, UE/LE Coordination activities, Wheelchair propulsion/positioning, Discharge planning, DME/adaptive equipment instruction, Functional mobility training, Psychosocial support, Pain management, Therapeutic Activities, UE/LE Strength taining/ROM  SLP Interventions    TR Interventions    SW/CM Interventions Discharge Planning, Psychosocial Support, Patient/Family Education    Team Discharge Planning: Destination: PT-Home ,OT- Home , SLP-  Projected Follow-up: PT-Outpatient PT, 24 hour supervision/assistance, Home health PT, OT-  Home health OT, SLP-  Projected Equipment Needs: PT-To be determined, OT-  , SLP-  Equipment Details: PT-will need RW and w/c if pt doesn't already have one , OT-pt has all needed equipment Patient/family involved in discharge planning: PT- Patient, Family member/caregiver,  OT-Patient, SLP-   MD ELOS: 7 days Medical Rehab Prognosis:  Excellent Assessment: The patient has been admitted for CIR therapies with the diagnosis of left bka. The team will be addressing functional mobility, strength, stamina, balance, safety, adaptive techniques and equipment, self-care, bowel and bladder mgt, patient and caregiver education, pre-prosthetic ed, stump care, pain mgt, ego support, community reintegration. Goals have been set at mod I for mobility and self-care. Pt is quite motivated!Meredith Staggers, MD, FAAPMR      See Team Conference Notes for weekly updates to the plan of care

## 2015-02-03 NOTE — Progress Notes (Signed)
Physical Therapy Session Note  Patient Details  Name: [name] MRN: [id number] Date of Birth: 03/14/1932  Today's Date: 02/03/2015 PT Individual Time: 1300-1330 PT Individual Time Calculation (min): 30 min   Short Term Goals: Week 1:  PT Short Term Goal 1 (Week 1): STGs=LTGs due to short ELOS  Skilled Therapeutic Interventions/Progress Updates:  Pt was seen bedside in the pm. Pt performed sit to stand transfers with rolling walker and S. Pt ambulated about 120 feet with rolling walker and min A. Treatment focused on L LE ROM and strengthening. Pt performed L knee flex/ext and quad sets 3 sets x 10 reps each. Pt performed 3 x 5 reps each arm chair push ups. Pt left sitting up in w/c with call bell within reach.   Therapy Documentation Precautions:  Precautions Precautions: Fall Precaution Comments: new left BKA Restrictions Weight Bearing Restrictions: Yes LLE Weight Bearing: Non weight bearing General:   Pain: No c/o pain.     See Function Navigator for Current Functional Status.   Therapy/Group: Individual Therapy  Dub Amis 02/03/2015, 3:57 PM

## 2015-02-04 ENCOUNTER — Inpatient Hospital Stay (HOSPITAL_COMMUNITY): Payer: Medicare Other | Admitting: Physical Therapy

## 2015-02-04 NOTE — Progress Notes (Signed)
Physical Therapy Session Note  Patient Details  Name: [name] MRN: [id number] Date of Birth: 03-29-1932  Today's Date: 02/04/2015 PT Individual Time: 1415-1500 PT Individual Time Calculation (min): 45 min   Short Term Goals: Week 1:  PT Short Term Goal 1 (Week 1): STGs=LTGs due to short ELOS  Skilled Therapeutic Interventions/Progress Updates:  Pt was seen bedside in the pm, initially reluctant to participate secondary to fatigue but agreed with encouragement. Pt propelled w/c about 120 feet with S and verbal cues. Performed quad sets and knee flex/ext L LE, 3 sets x 15 reps each. Pt transferred w/c to edge of bed with rolling walker and min guard. Pt transferred edge of bed to supine with S. Pt performed B SAQs and bridging, 3 sets x 15 reps each. Pt transferred supine to edge of bed with S. Pt transferred edge of bed to w/c with rolling walker and min guard. Pt propelled w/c back to room with S and verbal cues. Pt left sitting up in w/c.   Therapy Documentation Precautions:  Precautions Precautions: Fall Precaution Comments: new left BKA Restrictions Weight Bearing Restrictions: Yes LLE Weight Bearing: Non weight bearing General:   Pain: No c/o pain.     See Function Navigator for Current Functional Status.   Therapy/Group: Individual Therapy  Dub Amis 02/04/2015, 3:26 PM

## 2015-02-04 NOTE — Progress Notes (Signed)
PHYSICAL MEDICINE & REHABILITATION     PROGRESS NOTE    Subjective/Complaints: No complaints. Feeling well. Pain minimal at this time.   ROS: Pt denies fever, rash/itching, headache, blurred or double vision, nausea, vomiting, abdominal pain, diarrhea, chest pain, shortness of breath, palpitations, dysuria, dizziness, neck or back pain, bleeding, anxiety, or depression   Objective: Vital Signs: Blood pressure 117/55, pulse 73, temperature 98.6 F (37 C), temperature source Oral, resp. rate 17, height 5\' 5"  (1.651 m), weight 60.1 kg (132 lb 7.9 oz), SpO2 96 %. No results found. No results for input(s): WBC, HGB, HCT, PLT in the last 72 hours. No results for input(s): NA, K, CL, GLUCOSE, BUN, CREATININE, CALCIUM in the last 72 hours.  Invalid input(s): CO CBG (last 3)  No results for input(s): GLUCAP in the last 72 hours.  Wt Readings from Last 3 Encounters:  01/31/15 60.1 kg (132 lb 7.9 oz)  01/29/15 64.864 kg (143 lb)  01/25/15 65.137 kg (143 lb 9.6 oz)    Physical Exam:  Constitutional: She is oriented to person, place, and time.  HENT: oral mucosa is pink and moist Head: Normocephalic.  Eyes: EOM are normal.  Neck: Normal range of motion. Neck supple. No thyromegaly present.  Cardiovascular: Normal rate and regular rhythm. no murmurs or rubs. Respiratory: Effort normal and breath sounds normal. No respiratory distress.  GI: Soft. Bowel sounds are normal. She exhibits no distension.  Musculoskeletal:   edema decreased at stump--good shape Neurological: She is alert and oriented to person, place, and time.  UE 5/5 prox to distal. RLE: 4+/5 hf,ke,adf,apf. LLE: 3/5hF, ke 1+. ?mild sensory loss to LT in right foot below ankle.  Skin:  BKA site well approximated with mild serosang dc,  Psychiatric: She has a normal mood and affect. Her behavior is normal. Judgment and thought content normal   Assessment/Plan: 1. Functional deficits secondary to left  BKA which require 3+ hours per day of interdisciplinary therapy in a comprehensive inpatient rehab setting. Physiatrist is providing close team supervision and 24 hour management of active medical problems listed below. Physiatrist and rehab team continue to assess barriers to discharge/monitor patient progress toward functional and medical goals.  Function:  Bathing Bathing position   Position: Shower  Bathing parts Body parts bathed by patient: Right arm, Left arm, Chest, Abdomen, Right upper leg, Left upper leg, Front perineal area, Buttocks, Right lower leg Body parts bathed by helper: Back  Bathing assist Assist Level: Supervision or verbal cues   Set up : To obtain items  Upper Body Dressing/Undressing Upper body dressing   What is the patient wearing?: Pull over shirt/dress, Bra Bra - Perfomed by patient: Thread/unthread right bra strap, Thread/unthread left bra strap, Hook/unhook bra (pull down sports bra)   Pull over shirt/dress - Perfomed by patient: Thread/unthread right sleeve, Thread/unthread left sleeve, Put head through opening, Pull shirt over trunk          Upper body assist Assist Level: Supervision or verbal cues      Lower Body Dressing/Undressing Lower body dressing   What is the patient wearing?: Underwear, Pants, Shoes, [employer]  - Performed by patient: Thread/unthread right underwear leg, Thread/unthread left underwear leg, Pull underwear up/down   Pants- Performed by patient: Thread/unthread right pants leg, Thread/unthread left pants leg, Pull pants up/down           Shoes - Performed by patient: Don/doff right shoe       TED Hose - Performed by  patient: Don/doff right TED hose TED Hose - Performed by helper: Don/doff right TED hose  Lower body assist Assist Level: Supervision or verbal cues      Toileting Toileting   Toileting steps completed by patient: Adjust clothing prior to toileting, Performs perineal hygiene, Adjust clothing  after toileting   Toileting Assistive Devices: Grab bar or rail  Toileting assist Assist level: Supervision or verbal cues   Transfers Chair/bed transfer   Chair/bed transfer method: Ambulatory Chair/bed transfer assist level: Supervision or verbal cues Chair/bed transfer assistive device: Medical sales representative     Max distance: 120 Assist level: Touching or steadying assistance (Pt > 75%)   Wheelchair   Type: Manual Max wheelchair distance: 250' Assist Level: No help, No cues, assistive device, takes more than reasonable amount of time  Cognition Comprehension Comprehension assist level: Follows complex conversation/direction with no assist  Expression Expression assist level: Expresses complex ideas: With extra time/assistive device  Social Interaction Social Interaction assist level: Interacts appropriately with others with medication or extra time (anti-anxiety, antidepressant).  Problem Solving Problem solving assist level: Solves complex problems: With extra time  Memory Memory assist level: Complete Independence: No helper   Medical Problem List and Plan: 1. Functional deficits secondary to left BKA 01/29/2015  -progressing nicely in therapy 2. DVT Prophylaxis/Anticoagulation: Subcutaneous Lovenox. Monitor platelet counts and any signs of bleeding 3. Pain Management: Tylenol No. 3 and Ultram as needed. Really only needing tylenol currently -pt with only mild phantom limb pain, but it remains tolerable at this point 4. Hypertension. Hydrochlorothiazide 12.5 mg daily. normotensive 5. Neuropsych: This patient is capable of making decisions on her own behalf. 6. Skin/Wound Care: continued dry dressing with ACE---I  7. Fluids/Electrolytes/Nutrition: has good appetite. Add k+ replacement while here -mild hyponatremia---stable at 130-  recheck Monday 8. Prosthetics: continue ACE wrap for left stump---wound re-dressed  today -prosthetic education performed -consider shrinker sock depending upon appearance of the wound LOS (Days) 4 A FACE TO FACE EVALUATION WAS PERFORMED  SWARTZ,ZACHARY T 02/04/2015 8:21 AM

## 2015-02-05 ENCOUNTER — Inpatient Hospital Stay (HOSPITAL_COMMUNITY): Payer: Medicare Other | Admitting: Occupational Therapy

## 2015-02-05 ENCOUNTER — Inpatient Hospital Stay (HOSPITAL_COMMUNITY): Payer: Medicare Other | Admitting: Physical Therapy

## 2015-02-05 DIAGNOSIS — E871 Hypo-osmolality and hyponatremia: Secondary | ICD-10-CM

## 2015-02-05 LAB — BASIC METABOLIC PANEL
Anion gap: 8 (ref 5–15)
BUN: 5 mg/dL — AB (ref 6–20)
CHLORIDE: 102 mmol/L (ref 101–111)
CO2: 25 mmol/L (ref 22–32)
CREATININE: 0.43 mg/dL — AB (ref 0.44–1.00)
Calcium: 8.9 mg/dL (ref 8.9–10.3)
Glucose, Bld: 103 mg/dL — ABNORMAL HIGH (ref 65–99)
Potassium: 4.3 mmol/L (ref 3.5–5.1)
SODIUM: 135 mmol/L (ref 135–145)

## 2015-02-05 NOTE — Progress Notes (Signed)
Occupational Therapy Session Note  Patient Details  Name: [name] MRN: [id number] Date of Birth: January 13, 1932  Today's Date: 02/05/2015 OT Individual Time: 0900-1000 OT Individual Time Calculation (min): 60 min    Short Term Goals: Week 1:  OT Short Term Goal 1 (Week 1): STGs=LTGS secondary to estimated short LOS Week 2:         Skilled Therapeutic Interventions/Progress Updates:  Balance/vestibular training;Discharge planning;Pain management;Self Care/advanced ADL retraining;Therapeutic Activities;Functional mobility training;Patient/family education;Skin care/wound managment;Therapeutic Exercise;Community reintegration;DME/adaptive equipment instruction;Psychosocial support;UE/LE Strength taining/ROM;Wheelchair propulsion/positioning;UE/LE Coordination activities   Pt. Engaged in shower transfer, sit to stand for peri area using grab bar and SBA for safety.  See FX level.    Therapy Documentation Precautions:  Precautions Precautions: Fall Precaution Comments: new left BKA Restrictions Weight Bearing Restrictions: Yes LLE Weight Bearing: Non weight bearing     Pain:  none          See Function Navigator for Current Functional Status.   Therapy/Group: Individual Therapy  Lisa Roca 02/05/2015, 12:13 PM

## 2015-02-05 NOTE — Progress Notes (Signed)
Occupational Therapy Session Note  Patient Details  Name: [name] MRN: [id number] Date of Birth: 10/10/1931  Today's Date: 02/05/2015 OT Individual Time: 1430-1500 OT Individual Time Calculation (min): 30 min    Short Term Goals: Week 1:  OT Short Term Goal 1 (Week 1): STGs=LTGS secondary to estimated short LOS  Skilled Therapeutic Interventions/Progress Updates:    Pt seen for OT therapy session focusing on UE strengthening, functional activity tolerance, and w/c management. Pt sitting up in w/c upon arrival, agreeable to tx session. She voiced desire not to do a lot of standing during tx session as it agrivates R LE. She self propelled w/c to therapy gym where she completed UE strengthening exercises using level II theraband #3 weighted ball. She transferred onto therapy mat via squat pivot transfer with mod VCs for w/c set-up and parts management. Exercises completed x10 reps to all major UE muscle groups with demonstration and tactile cuing provided for proper form and technique.  Pt left sitting in therapy gym at end of session with hand off to PT.    Therapy Documentation Precautions:  Precautions Precautions: Fall Precaution Comments: new left BKA Restrictions Weight Bearing Restrictions: Yes LLE Weight Bearing: Non weight bearing Pain:   Pt voiced she had just received pain medication. Declined prolonged standing tasks due to pain in R LE when standing.   See Function Navigator for Current Functional Status.   Therapy/Group: Individual Therapy  Lewis, [name] C 02/05/2015, 7:21 AM

## 2015-02-05 NOTE — Progress Notes (Signed)
Physical Therapy Session Note  Patient Details  Name: [name] MRN: [id number] Date of Birth: [birth year]  Today's Date: 02/05/2015 PT Individual Time: 1000-1100 and 1500-1600 PT Individual Time Calculation (min): 60 min and 60 min   Short Term Goals: Week 1:  PT Short Term Goal 1 (Week 1): STGs=LTGs due to short ELOS  Skilled Therapeutic Interventions/Progress Updates:   Session 1: Pt received in w/c and agreeable to therapy session. Pt reports increased fatigue over weekend with RLE "giving out" during a transfer.  Pt propelled w/c to therapy gym and performed stand pivot transfer with RW and supervision.  PT instructed patient in supine therex x30 reps of glute sets, quad sets, heel slides, hip abd/add to midline, SLR, SAQ, and isometric hip extension. Pt able to perform each exercise with verbal cues for speed and technique.  PT instructed patient in dynamic standing balance activity with single UE support reach for and matching cards on the mirror with supervision for balance.  PT instructed patient in dynamic standing balance activity with single UE support and intermittent no UE support throwing basketball.  Pt able to retrieve basketball from floor with single UE support on RW.  CGA to min A provided for basketball activity.  Pt returned to room in w/c at end of session and left in w/c with daughter in law present, call bell in reach and needs met.    Session 2: Pt received in therapy gym, handoff from OT.  PT instructed patient in BLE therex seated on the edge of therapy mat.  Each exercise performed x30.  Pt performed heel/toe raises (R side only), LAQ, marching, ball squeezes, and hip abd with orange TB.  PT provided verbal cues for speed, pacing, and correct form.  PT instructed patient in dynamic standing balance activity with single UE support on RW, Wii bowling and tennis.  Pt able to tolerate ~6 minutes of standing for 3 trials before needing seated rest break 2/2 back pain and  L residual limb pain.  Pt returned to room in w/c at end of session and positioned with call bell in reach and needs met.    Therapy Documentation Precautions:  Precautions Precautions: Fall Precaution Comments: new left BKA Restrictions Weight Bearing Restrictions: Yes LLE Weight Bearing: Non weight bearing Pain: Pain Assessment Pain Assessment: No/denies pain   See Function Navigator for Current Functional Status.   Therapy/Group: Individual Therapy  Earnest Conroy Penven-Crew 02/05/2015, 12:30 PM

## 2015-02-05 NOTE — Progress Notes (Signed)
Left BKA continues to heal POD 7, less drainage Will recheck later in week  [name], MD [facility] Office: [phone] Pager: [phone]

## 2015-02-05 NOTE — Progress Notes (Signed)
[facility] PHYSICAL MEDICINE & REHABILITATION     PROGRESS NOTE    Subjective/Complaints: Had a great night of sleep. Pain minimal. Ready for more therapy today!Marland Kitchen   ROS: Pt denies fever, rash/itching, headache, blurred or double vision, nausea, vomiting, abdominal pain, diarrhea, chest pain, shortness of breath, palpitations, dysuria, dizziness, neck or back pain, bleeding, anxiety, or depression   Objective: Vital Signs: Blood pressure 133/60, pulse 68, temperature 98 F (36.7 C), temperature source Oral, resp. rate 18, height 5\' 5"  (1.651 m), weight 60.1 kg (132 lb 7.9 oz), SpO2 98 %. No results found. No results for input(s): WBC, HGB, HCT, PLT in the last 72 hours.  Recent Labs  02/05/15 0532  NA 135  K 4.3  CL 102  GLUCOSE 103*  BUN 5*  CREATININE 0.43*  CALCIUM 8.9   CBG (last 3)  No results for input(s): GLUCAP in the last 72 hours.  Wt Readings from Last 3 Encounters:  01/31/15 60.1 kg (132 lb 7.9 oz)  01/29/15 64.864 kg (143 lb)  01/25/15 65.137 kg (143 lb 9.6 oz)    Physical Exam:  Constitutional: She is oriented to person, place, and time.  HENT: oral mucosa is pink and moist Head: Normocephalic.  Eyes: EOM are normal.  Neck: Normal range of motion. Neck supple. No thyromegaly present.  Cardiovascular: Normal rate and regular rhythm. no murmurs or rubs. Respiratory: Effort normal and breath sounds normal. No respiratory distress.  GI: Soft. Bowel sounds are normal. She exhibits no distension.  Musculoskeletal:   edema decreased at stump--good shape Neurological: She is alert and oriented to person, place, and time.  UE 5/5 prox to distal. RLE: 4+/5 hf,ke,adf,apf. LLE: 3/5hF, ke 1+. ?mild sensory loss to LT in right foot below ankle.  Skin:  BKA site well approximated with mild serosang dc,  Psychiatric: She has a normal mood and affect. Her behavior is normal. Judgment and thought content normal   Assessment/Plan: 1. Functional deficits  secondary to left BKA which require 3+ hours per day of interdisciplinary therapy in a comprehensive inpatient rehab setting. Physiatrist is providing close team supervision and 24 hour management of active medical problems listed below. Physiatrist and rehab team continue to assess barriers to discharge/monitor patient progress toward functional and medical goals.  Function:  Bathing Bathing position   Position: Shower  Bathing parts Body parts bathed by patient: Right arm, Left arm, Chest, Abdomen, Right upper leg, Left upper leg, Front perineal area, Buttocks, Right lower leg Body parts bathed by helper: Back  Bathing assist Assist Level: Supervision or verbal cues   Set up : To obtain items  Upper Body Dressing/Undressing Upper body dressing   What is the patient wearing?: Pull over shirt/dress, Bra Bra - Perfomed by patient: Thread/unthread right bra strap, Thread/unthread left bra strap, Hook/unhook bra (pull down sports bra)   Pull over shirt/dress - Perfomed by patient: Thread/unthread right sleeve, Thread/unthread left sleeve, Put head through opening, Pull shirt over trunk          Upper body assist Assist Level: Supervision or verbal cues      Lower Body Dressing/Undressing Lower body dressing   What is the patient wearing?: Underwear, Pants, Shoes, [employer]  - Performed by patient: Thread/unthread right underwear leg, Thread/unthread left underwear leg, Pull underwear up/down   Pants- Performed by patient: Thread/unthread right pants leg, Thread/unthread left pants leg, Pull pants up/down           Shoes - Performed by patient: Don/doff  right shoe       TED Hose - Performed by patient: Don/doff right TED hose TED Hose - Performed by helper: Don/doff right TED hose  Lower body assist Assist Level: Supervision or verbal cues      Toileting Toileting   Toileting steps completed by patient: Adjust clothing prior to toileting, Performs perineal  hygiene, Adjust clothing after toileting   Toileting Assistive Devices: Grab bar or rail  Toileting assist Assist level: Supervision or verbal cues   Transfers Chair/bed transfer   Chair/bed transfer method: Stand pivot Chair/bed transfer assist level: Touching or steadying assistance (Pt > 75%) Chair/bed transfer assistive device: Medical sales representative     Max distance: 120 Assist level: Touching or steadying assistance (Pt > 75%)   Wheelchair   Type: Manual Max wheelchair distance: 120 Assist Level: No help, No cues, assistive device, takes more than reasonable amount of time  Cognition Comprehension Comprehension assist level: Follows complex conversation/direction with no assist  Expression Expression assist level: Expresses complex ideas: With extra time/assistive device  Social Interaction Social Interaction assist level: Interacts appropriately with others with medication or extra time (anti-anxiety, antidepressant).  Problem Solving Problem solving assist level: Solves complex problems: With extra time  Memory Memory assist level: Complete Independence: No helper   Medical Problem List and Plan: 1. Functional deficits secondary to left BKA 01/29/2015  -progressing nicely in therapy--team conference tomorrow 2. DVT Prophylaxis/Anticoagulation: Subcutaneous Lovenox. Monitor platelet counts and any signs of bleeding 3. Pain Management: Tylenol No. 3 and Ultram as needed. Really only needing tylenol currently -pt with only mild phantom limb pain, but it remains tolerable at this point 4. Hypertension. Hydrochlorothiazide 12.5 mg daily. normotensive 5. Neuropsych: This patient is capable of making decisions on her own behalf. 6. Skin/Wound Care: continued dry dressing with ACE---I  7. Fluids/Electrolytes/Nutrition: has good appetite. Add k+ replacement while here -mild hyponatremia---resolved. I personally reviewed the patient's labs  today.  8. Prosthetics: continue ACE wrap for left stump-- wound healing nicely -prosthetic education performed -consider shrinker sock depending upon appearance of the wound LOS (Days) 5 A FACE TO FACE EVALUATION WAS PERFORMED  Jones Viviani T 02/05/2015 8:13 AM

## 2015-02-06 ENCOUNTER — Inpatient Hospital Stay (HOSPITAL_COMMUNITY): Payer: Medicare Other | Admitting: Occupational Therapy

## 2015-02-06 ENCOUNTER — Inpatient Hospital Stay (HOSPITAL_COMMUNITY): Payer: Medicare Other | Admitting: Physical Therapy

## 2015-02-06 NOTE — Plan of Care (Signed)
Problem: RH PAIN MANAGEMENT Goal: RH STG PAIN MANAGED AT OR BELOW PT'S PAIN GOAL <4  Outcome: Progressing No c/o pain     

## 2015-02-06 NOTE — Patient Care Conference (Signed)
Inpatient RehabilitationTeam Conference and Plan of Care Update Date: 02/06/2015   Time: 2:35 PM    Patient Name: [name]      Medical Record Number: [id number]  Date of Birth: 03/18/1932 Sex: Female         Room/Bed: 4M12C/4M12C-01 Payor Info: Payor: MEDICARE / Plan: MEDICARE PART A AND B / Product Type: *No Product type* /    Admitting Diagnosis: L BKA  Admit Date/Time:  01/31/2015  4:45 PM Admission Comments: No comment available   Primary Diagnosis:  Status post below knee amputation of left lower extremity Principal Problem: Status post below knee amputation of left lower extremity  Patient Active Problem List   Diagnosis Date Noted  . Status post below knee amputation of left lower extremity 01/31/2015  . Non-healing wound of lower extremity 01/29/2015  . Ulcer of toe of left foot 12/27/2014  . Essential hypertension 08/22/2014    Expected Discharge Date: Expected Discharge Date: 02/08/15  Team Members Present: Physician leading conference: Dr. Thelma Comp Patel;Dr. Alger Simons Social Worker Present: Lennart Pall, LCSW Nurse Present: Heather Roberts, RN PT Present: Dwyane Dee, Lacy Duverney, PT OT Present: [name], OT;Roanna Epley, COTA SLP Present: Weston Anna, SLP PPS Coordinator present : Daiva Nakayama, RN, CRRN     Current Status/Progress Goal Weekly Team Focus  Medical   left bka. wound healing nicely. minimal pain. potassium and sodium have recovered.  improve activity tolerancwe  pain mgt, wound care   Bowel/Bladder   Continent of bowel and bladder. LBM 02/05/15  Pt to remain continent of bowel and bladder  Monitor   Swallow/Nutrition/ Hydration             ADL's   setup for bathing and dressing, toilet transfers mod I with drop arm,   mod I for bathing in sitting/ UB dressing; supervision for LB dressing, toileting and toilet transfer mod I   family education, d/c planning   Mobility   supervision all mobility  mod I transfers (stand  pivot or lateral scoot), supervision short distance amb, mod I w/c  strengthening whole body (LE/UE/core), dynamic balance, HEP   Communication             Safety/Cognition/ Behavioral Observations            Pain   Tramadol 25mg  q 6hrs  <3  Monitor for nonverbal pain   Skin   L BKA with staples, approximated, CDI  No additonal skin breakdown  Assess q shift for appropriate healing, Change drsg per order and as needed    Rehab Goals Patient on target to meet rehab goals: Yes *See Care Plan and progress notes for long and short-term goals.  Barriers to Discharge: n/a    Possible Resolutions to Barriers:  n/a    Discharge Planning/Teaching Needs:  home with son and dtr-in-law to provide 24/7 assistance      Team Discussion:  Doing well with all therapies and reaching targeted goals.  OT completed fam ed today.  Ready for d/c Thursday.  Revisions to Treatment Plan:  None   Continued Need for Acute Rehabilitation Level of Care: The patient requires daily medical management by a physician with specialized training in physical medicine and rehabilitation for the following conditions: Daily direction of a multidisciplinary physical rehabilitation program to ensure safe treatment while eliciting the highest outcome that is of practical value to the patient.: Yes Daily medical management of patient stability for increased activity during participation in an intensive rehabilitation regime.: Yes  Daily analysis of laboratory values and/or radiology reports with any subsequent need for medication adjustment of medical intervention for : Post surgical problems;Other  [name], Gail 02/06/2015, 3:37 PM

## 2015-02-06 NOTE — Progress Notes (Signed)
Physical Therapy Session Note  Patient Details  Name: [name] MRN: [id number] Date of Birth: 1931/11/05  Today's Date: 02/06/2015 PT Individual Time: 1105-1200 1400-1430 PT Individual Time Calculation (min): 60 min and 30 min   Short Term Goals: Week 1:  PT Short Term Goal 1 (Week 1): STGs=LTGs due to short ELOS  Skilled Therapeutic Interventions/Progress Updates:    Session 1: Pt received supine in bed and agreeable to therapy.  PT instructed patient in squat/pivot transfer from EOB to w/c with supervision and verbal cues for w/c set up.  Pt propelled to gym and transferred to therapy mat with supervision and min verbal cues for proximity of w/c to therapy mat.  PT instructed patient in dynamic sitting balance/UE strengthening exercise, throwing/catching 1# ball against trampoline with emphasis on catching/throwing outside BOS.  Pt performed 3 sets of 10 with 1# ball and progressed to 3 sets of 10 with 3# ball.  Pt transferred back to w/c and propelled to ortho gym. PT instructed patient in lateral scoot car transfer from w/c with mod verbal cues for w/c set up (proximity, w/c brakes, and arm rest).  PT instructed patient in seated therex in w/c with 2# ankle weight on R ankle. Pt performed 2x15 reps of heel/toe raises, LAQ, seated marching, pillow squeezes, and hip abd with manual resistance from therapist.  Pt propelled w/c back to room and end of session and positioned with call bell in reach and needs met.    Session 2: Pt received in w/c and agreeable to therapy session.  Pt propelled w/c from room to day room mod I, requiring supervision for w/c set up for squat/pivot to therapy mat.  PT instructed patient in supine LE therex x30 reps of heel slides (R only), SLR, and hip abd/add to midline. PT measured patient for w/c and discussed DME options for d/c considering pt's pending RLE amputation.  Pt propelled w/c back to room and positioned with call bell in reach and needs met.     Therapy Documentation Precautions:  Precautions Precautions: Fall Precaution Comments: new left BKA Restrictions Weight Bearing Restrictions: Yes LLE Weight Bearing: Non weight bearing Pain: Pain Assessment Pain Assessment: 0-10 Pain Score: 1  Pain Location: Leg Pain Orientation: Left Pain Descriptors / Indicators: Sore;Other (Comment) (uncomfortable)   See Function Navigator for Current Functional Status.   Therapy/Group: Individual Therapy  [name] E Penven-Crew 02/06/2015, 11:23 AM

## 2015-02-06 NOTE — Progress Notes (Signed)
Occupational Therapy Session Note  Patient Details  Name: [name] MRN: [id number] Date of Birth: December 13, 1931  Today's Date: 02/06/2015 OT Individual Time: 1300-1330 OT Individual Time Calculation (min): 30 min    Short Term Goals: Week 1:  OT Short Term Goal 1 (Week 1): STGs=LTGS secondary to estimated short LOS  Skilled Therapeutic Interventions/Progress Updates:    1:1 therapeutic activities to address core strengthening sitting EOM without UE/LE support. Pt able to perform core activities and reciprocal scooting activity to address overall strengthening and conditioning to assist with ADL performance in home environment.  Therapy Documentation Precautions:  Precautions Precautions: Fall Precaution Comments: new left BKA Restrictions Weight Bearing Restrictions: Yes LLE Weight Bearing: Non weight bearing Pain: Pain Assessment Pain Assessment: No/denies pain  See Function Navigator for Current Functional Status.   Therapy/Group: Individual Therapy  [name] [facility] 02/06/2015, 3:34 PM

## 2015-02-06 NOTE — Progress Notes (Signed)
Occupational Therapy Session Note  Patient Details  Name: [name] MRN: [id number] Date of Birth: 23-Sep-1931  Today's Date: 02/06/2015 OT Individual Time: 0900-1000 OT Individual Time Calculation (min): 60 min    Short Term Goals: Week 1:  OT Short Term Goal 1 (Week 1): STGs=LTGS secondary to estimated short LOS  Skilled Therapeutic Interventions/Progress Updates:    1:1 Performed extensive education with pt and pt's daughter in law about care for residual limb, preparing for prosthetic,  safety tips for home etc with hand outs provided. Provided education on HAHA support group. Performed bilateral hip extension stretching on stomach on mat in gym while continuing to educate on transition to home. Education and performed squat pivot transfers to drop arm BSC, bed, couch, recliner and w/c for increased independence and energy conservation; and able to perform at supervision level with minimal cuing. Perform simulated simple meal prep and laundry tasks sit to stand and at w/c level. Focus on energy conservation techniques when performing home tasks and how to transport items. Education on use of tub bench- daughter in law reported they would like one. Did have some discussion about planning for the future right side amputation and how to adapt lifestyle.   Therapy Documentation Precautions:  Precautions Precautions: Fall Precaution Comments: new left BKA Restrictions Weight Bearing Restrictions: Yes LLE Weight Bearing: Non weight bearing Pain:  no c/o pain   See Function Navigator for Current Functional Status.   Therapy/Group: Individual Therapy  [name] [facility] 02/06/2015, 10:40 AM

## 2015-02-06 NOTE — Progress Notes (Signed)
[place] PHYSICAL MEDICINE & REHABILITATION     PROGRESS NOTE    Subjective/Complaints: No new complaints. Pain minimal. Education for daughter yesterday  ROS: Pt denies fever, rash/itching, headache, blurred or double vision, nausea, vomiting, abdominal pain, diarrhea, chest pain, shortness of breath, palpitations, dysuria, dizziness, neck or back pain, bleeding, anxiety, or depression   Objective: Vital Signs: Blood pressure 133/79, pulse 80, temperature 98.4 F (36.9 C), temperature source Oral, resp. rate 18, height 5\' 5"  (1.651 m), weight 60.1 kg (132 lb 7.9 oz), SpO2 98 %. No results found. No results for input(s): WBC, HGB, HCT, PLT in the last 72 hours.  Recent Labs  02/05/15 0532  NA 135  K 4.3  CL 102  GLUCOSE 103*  BUN 5*  CREATININE 0.43*  CALCIUM 8.9   CBG (last 3)  No results for input(s): GLUCAP in the last 72 hours.  Wt Readings from Last 3 Encounters:  01/31/15 60.1 kg (132 lb 7.9 oz)  01/29/15 64.864 kg (143 lb)  01/25/15 65.137 kg (143 lb 9.6 oz)    Physical Exam:  Constitutional: She is oriented to person, place, and time.  HENT: oral mucosa is pink and moist Head: Normocephalic.  Eyes: EOM are normal.  Neck: Normal range of motion. Neck supple. No thyromegaly present.  Cardiovascular: Normal rate and regular rhythm. no murmurs or rubs. Respiratory: Effort normal and breath sounds normal. No respiratory distress.  GI: Soft. Bowel sounds are normal. She exhibits no distension.  Musculoskeletal:   edema decreased at stump--good shape still---some swelling still distally Neurological: She is alert and oriented to person, place, and time.  UE 5/5 prox to distal. RLE: 4+/5 hf,ke,adf,apf. LLE: 3/5hF, ke 1+. ?mild sensory loss to LT in right foot below ankle.  Skin:  BKA site well approximated. Almost no drainage  Psychiatric: She has a normal mood and affect. Her behavior is normal. Judgment and thought content  normal   Assessment/Plan: 1. Functional deficits secondary to left BKA which require 3+ hours per day of interdisciplinary therapy in a comprehensive inpatient rehab setting. Physiatrist is providing close team supervision and 24 hour management of active medical problems listed below. Physiatrist and rehab team continue to assess barriers to discharge/monitor patient progress toward functional and medical goals.  Function:  Bathing Bathing position   Position: Shower  Bathing parts Body parts bathed by patient: Right arm, Left arm, Chest, Abdomen, Right upper leg, Left upper leg, Front perineal area, Buttocks, Right lower leg Body parts bathed by helper: Back  Bathing assist Assist Level: Supervision or verbal cues   Set up : To obtain items  Upper Body Dressing/Undressing Upper body dressing   What is the patient wearing?: Pull over shirt/dress, Bra Bra - Perfomed by patient: Thread/unthread right bra strap, Thread/unthread left bra strap, Hook/unhook bra (pull down sports bra)   Pull over shirt/dress - Perfomed by patient: Thread/unthread right sleeve, Thread/unthread left sleeve, Put head through opening, Pull shirt over trunk          Upper body assist Assist Level: Supervision or verbal cues      Lower Body Dressing/Undressing Lower body dressing   What is the patient wearing?: Underwear, Pants, Shoes, [employer]  - Performed by patient: Thread/unthread right underwear leg, Thread/unthread left underwear leg, Pull underwear up/down   Pants- Performed by patient: Thread/unthread right pants leg, Thread/unthread left pants leg, Pull pants up/down           Shoes - Performed by patient: Don/doff right shoe  TED Hose - Performed by patient: Don/doff right TED hose TED Hose - Performed by helper: Don/doff right TED hose  Lower body assist Assist Level: Supervision or verbal cues      Toileting Toileting   Toileting steps completed by patient: Adjust  clothing prior to toileting, Performs perineal hygiene, Adjust clothing after toileting   Toileting Assistive Devices: Grab bar or rail  Toileting assist Assist level: Supervision or verbal cues   Transfers Chair/bed transfer   Chair/bed transfer method: Stand pivot Chair/bed transfer assist level: Supervision or verbal cues Chair/bed transfer assistive device: Medical sales representative     Max distance: 120 Assist level: Touching or steadying assistance (Pt > 75%)   Wheelchair   Type: Manual Max wheelchair distance: 150 Assist Level: No help, No cues, assistive device, takes more than reasonable amount of time  Cognition Comprehension Comprehension assist level: Follows complex conversation/direction with no assist  Expression Expression assist level: Expresses complex ideas: With no assist  Social Interaction Social Interaction assist level: Interacts appropriately with others - No medications needed.  Problem Solving Problem solving assist level: Solves complex problems: With extra time  Memory Memory assist level: Complete Independence: No helper   Medical Problem List and Plan: 1. Functional deficits secondary to left BKA 01/29/2015  -progressing nicely in therapy--team conference today.  ?home tomorrow 2. DVT Prophylaxis/Anticoagulation: Subcutaneous Lovenox. Monitor platelet counts and any signs of bleeding 3. Pain Management: Tylenol No. 3 and Ultram as needed. Really only needing tylenol currently -pt's phantom pain has resolved. 4. Hypertension. Hydrochlorothiazide 12.5 mg daily. normotensive 5. Neuropsych: This patient is capable of making decisions on her own behalf. 6. Skin/Wound Care: continued dry dressing with ACE---  7. Fluids/Electrolytes/Nutrition: has good appetite. continue k+ replacement while here -mild hyponatremia---resolved.   8. Prosthetics: continue ACE wrap for left stump-- wound healing  nicely -prosthetic education performed -shrinker as an outpatient LOS (Days) 6 A FACE TO FACE EVALUATION WAS PERFORMED  SWARTZ,ZACHARY T 02/06/2015 8:05 AM

## 2015-02-06 NOTE — Plan of Care (Signed)
Problem: RH Ambulation Goal: LTG Patient will ambulate in controlled environment (PT) LTG: Patient will ambulate in a controlled environment, # of feet with assistance (PT).  Distance downgraded as patient does not have a large home and will be using w/c in community

## 2015-02-06 NOTE — Progress Notes (Signed)
Social Work Patient ID: [name], female   DOB: 04/15/1932, 81 y.o.   MRN: [id number]  Lowella Curb, LCSW Social Worker Signed  Patient Care Conference 02/06/2015  3:27 PM    Expand All Collapse All   Inpatient RehabilitationTeam Conference and Plan of Care Update Date: 02/06/2015   Time: 2:35 PM     Patient Name: [name]       Medical Record Number: [id number]  Date of Birth: April 03, 1932 Sex: Female         Room/Bed: 4M12C/4M12C-01 Payor Info: Payor: MEDICARE / Plan: MEDICARE PART A AND B / Product Type: *No Product type* /    Admitting Diagnosis: L BKA   Admit Date/Time:  01/31/2015  4:45 PM Admission Comments: No comment available   Primary Diagnosis:  Status post below knee amputation of left lower extremity Principal Problem: Status post below knee amputation of left lower extremity    Patient Active Problem List     Diagnosis  Date Noted   .  Status post below knee amputation of left lower extremity  01/31/2015   .  Non-healing wound of lower extremity  01/29/2015   .  Ulcer of toe of left foot  12/27/2014   .  Essential hypertension  08/22/2014     Expected Discharge Date: Expected Discharge Date: 02/08/15  Team Members Present: Physician leading conference: Dr. Thelma Comp Patel;Dr. Alger Simons Social Worker Present: Lennart Pall, LCSW Nurse Present: Heather Roberts, RN PT Present: Dwyane Dee, Lacy Duverney, PT OT Present: [name], OT;Roanna Epley, COTA SLP Present: Weston Anna, SLP PPS Coordinator present : Daiva Nakayama, RN, CRRN        Current Status/Progress  Goal  Weekly Team Focus   Medical     left bka. wound healing nicely. minimal pain. potassium and sodium have recovered.  improve activity tolerancwe  pain mgt, wound care   Bowel/Bladder     Continent of bowel and bladder. LBM 02/05/15   Pt to remain continent of bowel and bladder   Monitor   Swallow/Nutrition/ Hydration               ADL's     setup for bathing and  dressing, toilet transfers mod I with drop arm,   mod I for bathing in sitting/ UB dressing; supervision for LB dressing, toileting and toilet transfer mod I   family education, d/c planning   Mobility     supervision all mobility  mod I transfers (stand pivot or lateral scoot), supervision short distance amb, mod I w/c  strengthening whole body (LE/UE/core), dynamic balance, HEP   Communication               Safety/Cognition/ Behavioral Observations              Pain     Tramadol 25mg  q 6hrs  <3  Monitor for nonverbal pain   Skin     L BKA with staples, approximated, CDI   No additonal skin breakdown  Assess q shift for appropriate healing, Change drsg per order and as needed    Rehab Goals Patient on target to meet rehab goals: Yes *See Care Plan and progress notes for long and short-term goals.    Barriers to Discharge:  n/a     Possible Resolutions to Barriers:   n/a     Discharge Planning/Teaching Needs:   home with son and dtr-in-law to provide 24/7 assistance       Team Discussion:  Doing well with all therapies and reaching targeted goals.  OT completed fam ed today.  Ready for d/c Thursday.   Revisions to Treatment Plan:    None    Continued Need for Acute Rehabilitation Level of Care: The patient requires daily medical management by a physician with specialized training in physical medicine and rehabilitation for the following conditions: Daily direction of a multidisciplinary physical rehabilitation program to ensure safe treatment while eliciting the highest outcome that is of practical value to the patient.: Yes Daily medical management of patient stability for increased activity during participation in an intensive rehabilitation regime.: Yes Daily analysis of laboratory values and/or radiology reports with any subsequent need for medication adjustment of medical intervention for : Post surgical problems;Other  HOYLE, LUCY 02/06/2015, 3:37 PM

## 2015-02-07 ENCOUNTER — Inpatient Hospital Stay (HOSPITAL_COMMUNITY): Payer: Medicare Other | Admitting: Occupational Therapy

## 2015-02-07 ENCOUNTER — Inpatient Hospital Stay (HOSPITAL_COMMUNITY): Payer: Medicare Other | Admitting: Physical Therapy

## 2015-02-07 NOTE — Progress Notes (Signed)
Occupational Therapy Discharge Summary  Patient Details  Name: [name] MRN: [id number] Date of Birth: 1932/04/02    Patient has met 11 of 11 long term goals due to improved activity tolerance, improved balance, ability to compensate for deficits and improved coordination.  Patient to discharge at overall Mod I - supervision level.  Patient's care partner is independent to provide the necessary supervision assistance at discharge.    Reasons goals not met: all goals met  Recommendation:  Patient will benefit from ongoing skilled OT services in home health setting to continue to advance functional skills in the area of BADL and iADL.  Equipment: drop arm commode chair , tub transfer bench, and safety treads for shower  Reasons for discharge: treatment goals met  Patient/family agrees with progress made and goals achieved: Yes  OT Discharge Precautions/Restrictions  Precautions Precautions: Fall Precaution Comments: new left BKA Restrictions Weight Bearing Restrictions: Yes LLE Weight Bearing: Non weight bearing General   Vital Signs Therapy Vitals Temp: 98.4 F (36.9 C) Temp Source: Oral Pulse Rate: 70 Resp: 18 BP: (!) 128/47 mmHg Patient Position (if appropriate): Lying Oxygen Therapy SpO2: 100 % O2 Device: Not Delivered Pain Pain Assessment Pain Assessment: No/denies pain Pain Score: 0-No pain Vision/Perception  Vision- History Baseline Vision/History: Wears glasses Wears Glasses: Reading only Patient Visual Report: No change from baseline  Cognition Overall Cognitive Status: Within Functional Limits for tasks assessed Arousal/Alertness: Awake/alert Orientation Level: Oriented X4 Attention: Selective Selective Attention: Appears intact Memory: Appears intact Awareness: Appears intact Problem Solving: Appears intact Safety/Judgment: Appears intact Sensation Sensation Light Touch: Appears Intact Stereognosis: Not tested Hot/Cold: Appears  Intact Proprioception: Appears Intact Coordination Gross Motor Movements are Fluid and Coordinated: Yes Fine Motor Movements are Fluid and Coordinated: Yes Motor  Motor Motor: Abnormal postural alignment and control Mobility  Bed Mobility Bed Mobility: Rolling Right;Supine to Sit;Sit to Supine Rolling Right: 6: Modified independent (Device/Increase time) Supine to Sit: 6: Modified independent (Device/Increase time) Sit to Supine: 6: Modified independent (Device/Increase time) Transfers Sit to Stand: 6: Modified independent (Device/Increase time) Stand to Sit: 6: Modified independent (Device/Increase time)  Trunk/Postural Assessment  Cervical Assessment Cervical Assessment: Within Functional Limits Thoracic Assessment Thoracic Assessment: Within Functional Limits Lumbar Assessment Lumbar Assessment: Within Functional Limits Postural Control Postural Control: Within Functional Limits  Balance Balance Balance Assessed: Yes Dynamic Sitting Balance Dynamic Sitting - Balance Support: No upper extremity supported;Feet supported Dynamic Sitting - Level of Assistance: 6: Modified independent (Device/Increase time) Dynamic Sitting - Balance Activities: Forward lean/weight shifting;Reaching for objects;Reaching across midline Static Standing Balance Static Standing - Balance Support: Right upper extremity supported;Left upper extremity supported Static Standing - Level of Assistance: 6: Modified independent (Device/Increase time) Dynamic Standing Balance Dynamic Standing - Balance Support: Right upper extremity supported;Left upper extremity supported Dynamic Standing - Level of Assistance: 6: Modified independent (Device/Increase time);5: Stand by assistance Dynamic Standing - Balance Activities: Forward lean/weight shifting;Reaching for objects;Reaching across midline Extremity/Trunk Assessment RUE Assessment RUE Assessment: Within Functional Limits LUE Assessment LUE Assessment:  Within Functional Limits   See Function Navigator for Current Functional Status.  Phineas Semen 02/07/2015, 4:28 PM

## 2015-02-07 NOTE — Discharge Summary (Signed)
Discharge summary job [id number]

## 2015-02-07 NOTE — Discharge Instructions (Signed)
Inpatient Rehab Discharge Instructions  TAKEILA THAYNE Discharge date and time: No discharge date for patient encounter.   Activities/Precautions/ Functional Status: Activity: activity as tolerated Diet: regular diet Wound Care: keep wound clean and dry Functional status:  ___ No restrictions     ___ Walk up steps independently ___ 24/7 supervision/assistance   ___ Walk up steps with assistance ___ Intermittent supervision/assistance  ___ Bathe/dress independently ___ Walk with walker     ___ Bathe/dress with assistance ___ Walk Independently    ___ Shower independently _x__ Walk with assistance    ___ Shower with assistance ___ No alcohol     ___ Return to work/school ________    COMMUNITY REFERRALS UPON DISCHARGE:    Home Health:   PT     OT     RN                     Agency:  [facility] Phone: [phone]   Medical Equipment/Items Ordered: wheelchair, cushion, rolling walker, commode and tub bench                                                     Agency/Supplier:  [place] @ [phone]   GENERAL COMMUNITY RESOURCES FOR PATIENT/FAMILY:  Support Groups:  Amputee Support Group    Special Instructions:    My questions have been answered and I understand these instructions. I will adhere to these goals and the provided educational materials after my discharge from the hospital.  Patient/Caregiver Signature _______________________________ Date __________  Clinician Signature _______________________________________ Date __________  Please bring this form and your medication list with you to all your follow-up doctor's appointments.

## 2015-02-07 NOTE — Discharge Summary (Signed)
NAMEJESSY, [name] NO.:  [id number]  MEDICAL RECORD NO.:  [id number]  LOCATION:  4M12C                        FACILITY:  [facility]  PHYSICIAN:  Lauraine Rinne, P.A.  DATE OF BIRTH:  1931/10/13  DATE OF ADMISSION:  01/31/2015 DATE OF DISCHARGE:  02/08/2015                              DISCHARGE SUMMARY   DISCHARGE DIAGNOSES: 1. Functional deficits secondary to left below-knee amputation (BKA). 2. Subcutaneous Lovenox for DVT prophylaxis. 3. Pain management. 4. Hypertension. 5. Constipation, resolved.  HISTORY OF PRESENT ILLNESS:  This is an 81 year old right-handed female with history of peripheral vascular disease, had been living alone prior to admission with assistance from her son and daughter-in-law.  She presented on January 29, 2015, with nonhealing left foot wound.  No relief with conservative care, progressive ischemic changes.  Underwent left below-knee amputation on January 29, 2015, per Dr. Oneida Alar. Hospital course and pain management.  Subcutaneous Lovenox for DVT prophylaxis.  Physical and occupational therapy evaluations completed and ongoing.  The patient was admitted for a comprehensive rehab program.  PAST MEDICAL HISTORY:  See discharge diagnoses.  SOCIAL HISTORY:  Lives alone with assistance of family, independent with assistive device prior to admission.  Functional status upon admission to rehab services was minimal assist, ambulate 100-feet rolling walker, minimal guard stand pivot transfers, min-mod assist activities of daily living.  PHYSICAL EXAMINATION:  VITAL SIGNS:  Blood pressure 124/64, pulse 86, temperature 98, and respirations 18. GENERAL:  This is an alert, pleasant female, in no acute distress. LUNGS:  Clear to auscultation. CARDIAC:  Regular rate and rhythm. ABDOMEN:  Soft, nontender.  Good bowel sounds. EXTREMITIES:  BKA site was dressed, appropriately tender. SKIN:  Intact.  REHABILITATION HOSPITAL COURSE:  The  patient was admitted to Inpatient Rehab Services with therapies initiated on a 3-hour daily basis, consisting of physical therapy, occupational therapy, and rehabilitation nursing.  The following issues were addressed during the patient's rehabilitation stay.  Pertaining to [name]'s left BKA, surgical site healing nicely, she would follow up with Dr. Oneida Alar of Vascular Surgery as well as outpatient prosthetics.  Subcutaneous Lovenox for DVT prophylaxis.  No bleeding episodes.  She remained on Plavix therapy as prior to admission.  Blood pressures controlled on low-dose hydrochlorothiazide with no orthostatic changes.  She had some mild hypokalemia, resolved with potassium supplement.  Bouts of constipation, resolved with laxative assistance.  The patient received weekly collaborative interdisciplinary team conferences to discuss estimated length of stay, family teaching, and any barriers to her discharge. Instructed the patient on squat pivot transfers from edge of bed to wheelchair with supervision, verbal cues for wheelchair setup.  Propel to the gym and transfer to therapy mat with supervision minimal cues. Instructed the patient on dynamic sitting balance.  Transferred back to her wheelchair, propelled to the orthopaedic gym.  Lateral scoot, car transfers from wheelchair with moderate cues.  Activities of daily living and homemaking, performed extensive education with the patient and the patient's daughter-in-law about care for her residual limb as she prepared for prosthetic.  Safety tips for home and handouts. Education and perform squat pivot transfers to a drop arm bedside commode, bed, couch recliner, and wheelchair for increased independence and energy  conservation, able to perform at a supervision level.  The patient with excellent overall gains was discharged to home with ongoing therapies dictated per rehab services.  DISCHARGE MEDICATIONS:  Included; 1. Tylenol No. 3,  1 tablet every 6 hours as needed for moderate pain,     dispense of 90 tablets. 2. Plavix 75 mg p.o. daily. 3. Colace 100 mg p.o. daily. 4. Hydrochlorothiazide 12.5 mg p.o. daily. 5. Protonix 40 mg p.o. daily. 6. Ultram 25 mg every 2 hours as needed for moderate pain.  DIET:  Regular.  SPECIAL INSTRUCTIONS:  The patient will follow up with Dr. Alger Simons at the Outpatient Rehab Service Office, to be contacted; Dr. Oneida Alar, Vascular Surgery, 2 weeks call for appointment; Dr. Lamar Blinks, medical management.     Lauraine Rinne, P.A.     DA/MEDQ  D:  02/07/2015  T:  02/07/2015  Job:  [id number]  cc:   [name], MD Meredith Staggers, M.D. [name]. Oneida Alar, MD

## 2015-02-07 NOTE — Progress Notes (Signed)
Occupational Therapy Session Note  Patient Details  Name: [name] MRN: [id number] Date of Birth: [birth year]  Today's Date: 02/07/2015 OT Individual Time: 4656-8127 and 1300-1350 OT Individual Time Calculation (min): 86 min and 50 min   Short Term Goals: Week 1:  OT Short Term Goal 1 (Week 1): STGs=LTGS secondary to estimated short LOS  Skilled Therapeutic Interventions/Progress Updates:  Session 1: Upon entering the room, pt seated in wheelchair with no c/o pain. Pt obtained all needed materials for bathing and dressing and propelled wheelchair to ADL apartment. Pt ambulating with RW into bathroom on onto TTB with supervision for safety. OT providing verbal cues and education for caregiver present while pt wrapped/covered L LE in order to decrease risk of infection with shower. Pt performed bathing at Mod I level and dressed self while seated on edge of TTB. Pt returning to room via wheelchair and grooming while seated in wheelchair at sink side. Pt made Mod I in room this session. No further questions from caregiver or pt at this time. Call bell and all needed items within reach upon exiting the room.   Session 2: Upon entering the room, pt supine in bed with daughter in law present. Pt performed supine >sit with Mod I. Mod I squat pivot transfer from edge of bed > into wheelchair. OT and pt reviewing paper handout of energy conservation techniques from previous session. Pt with no additional questions regarding information after brief discussion. Pt engaged in 3 sets of 10 chest pulls, shoulder diagonals, and shoulder flexion exercises with use of green, level 3 resistive theraband. Pt remained seated in wheelchair with call bell and all needed items within reach.  Therapy Documentation Precautions:  Precautions Precautions: Fall Precaution Comments: new left BKA Restrictions Weight Bearing Restrictions: Yes LLE Weight Bearing: Non weight bearing Vital Signs: Therapy  Vitals Temp: 98.4 F (36.9 C) Temp Source: Oral Pulse Rate: 70 Resp: 18 BP: (!) 128/47 mmHg Patient Position (if appropriate): Lying Oxygen Therapy SpO2: 100 % O2 Device: Not Delivered  See Function Navigator for Current Functional Status.   Therapy/Group: Individual Therapy  Phineas Semen 02/07/2015, 4:21 PM

## 2015-02-07 NOTE — Progress Notes (Signed)
[place] PHYSICAL MEDICINE & REHABILITATION     PROGRESS NOTE    Subjective/Complaints: No new issues. Pain controlled. Pleased with progress.  ROS: Pt denies fever, rash/itching, headache, blurred or double vision, nausea, vomiting, abdominal pain, diarrhea, chest pain, shortness of breath, palpitations, dysuria, dizziness, neck or back pain, bleeding, anxiety, or depression   Objective: Vital Signs: Blood pressure 133/67, pulse 71, temperature 98.5 F (36.9 C), temperature source Oral, resp. rate 18, height 5\' 5"  (1.651 m), weight 60.1 kg (132 lb 7.9 oz), SpO2 98 %. No results found. No results for input(s): WBC, HGB, HCT, PLT in the last 72 hours.  Recent Labs  02/05/15 0532  NA 135  K 4.3  CL 102  GLUCOSE 103*  BUN 5*  CREATININE 0.43*  CALCIUM 8.9   CBG (last 3)  No results for input(s): GLUCAP in the last 72 hours.  Wt Readings from Last 3 Encounters:  01/31/15 60.1 kg (132 lb 7.9 oz)  01/29/15 64.864 kg (143 lb)  01/25/15 65.137 kg (143 lb 9.6 oz)    Physical Exam:  Constitutional: She is oriented to person, place, and time.  HENT: oral mucosa is pink and moist Head: Normocephalic.  Eyes: EOM are normal.  Neck: Normal range of motion. Neck supple. No thyromegaly present.  Cardiovascular: Normal rate and regular rhythm. no murmurs or rubs. Respiratory: Effort normal and breath sounds normal. No respiratory distress.  GI: Soft. Bowel sounds are normal. She exhibits no distension.  Musculoskeletal:   edema decreased at stump--good shape still---some swelling still distally Neurological: She is alert and oriented to person, place, and time.  UE 5/5 prox to distal. RLE: 4+/5 hf,ke,adf,apf. LLE: 3/5hF, ke 1+. ?mild sensory loss to LT in right foot below ankle.  Skin:  BKA site well approximated.  Minimal drainage  Psychiatric: She has a normal mood and affect. Her behavior is normal. Judgment and thought content normal   Assessment/Plan: 1.  Functional deficits secondary to left BKA which require 3+ hours per day of interdisciplinary therapy in a comprehensive inpatient rehab setting. Physiatrist is providing close team supervision and 24 hour management of active medical problems listed below. Physiatrist and rehab team continue to assess barriers to discharge/monitor patient progress toward functional and medical goals.  Function:  Bathing Bathing position   Position: Shower  Bathing parts Body parts bathed by patient: Right arm, Left arm, Chest, Abdomen, Right upper leg, Left upper leg, Front perineal area, Buttocks, Right lower leg Body parts bathed by helper: Back  Bathing assist Assist Level: Supervision or verbal cues   Set up : To obtain items  Upper Body Dressing/Undressing Upper body dressing   What is the patient wearing?: Pull over shirt/dress, Bra Bra - Perfomed by patient: Thread/unthread right bra strap, Thread/unthread left bra strap, Hook/unhook bra (pull down sports bra)   Pull over shirt/dress - Perfomed by patient: Thread/unthread right sleeve, Thread/unthread left sleeve, Put head through opening, Pull shirt over trunk          Upper body assist Assist Level: Supervision or verbal cues      Lower Body Dressing/Undressing Lower body dressing   What is the patient wearing?: Underwear, Pants, Shoes, [employer]  - Performed by patient: Thread/unthread right underwear leg, Thread/unthread left underwear leg, Pull underwear up/down   Pants- Performed by patient: Thread/unthread right pants leg, Thread/unthread left pants leg, Pull pants up/down           Shoes - Performed by patient: Don/doff right shoe  TED Hose - Performed by patient: Don/doff right TED hose TED Hose - Performed by helper: Don/doff right TED hose  Lower body assist Assist Level: Supervision or verbal cues      Toileting Toileting   Toileting steps completed by patient: Performs perineal hygiene Toileting  steps completed by helper: Adjust clothing prior to toileting, Adjust clothing after toileting Toileting Assistive Devices: Grab bar or rail  Toileting assist Assist level: Supervision or verbal cues   Transfers Chair/bed transfer   Chair/bed transfer method: Squat pivot Chair/bed transfer assist level: Supervision or verbal cues Chair/bed transfer assistive device: Medical sales representative     Max distance: 120 Assist level: Touching or steadying assistance (Pt > 75%)   Wheelchair   Type: Manual Max wheelchair distance: 150 Assist Level: No help, No cues, assistive device, takes more than reasonable amount of time  Cognition Comprehension Comprehension assist level: Follows basic conversation/direction with no assist  Expression Expression assist level: Expresses complex ideas: With no assist  Social Interaction Social Interaction assist level: Interacts appropriately with others - No medications needed.  Problem Solving Problem solving assist level: Solves complex problems: With extra time  Memory Memory assist level: Complete Independence: No helper   Medical Problem List and Plan: 1. Functional deficits secondary to left BKA 01/29/2015  -progressing nicely in therapy--home tomorrow  -will ask [employer] to bring stump shrinkers today 2. DVT Prophylaxis/Anticoagulation: Subcutaneous Lovenox. Monitor platelet counts and any signs of bleeding 3. Pain Management: Tylenol No. 3 and Ultram as needed. Really only needing tylenol currently -pt's phantom pain has resolved. 4. Hypertension. Hydrochlorothiazide 12.5 mg daily. normotensive 5. Neuropsych: This patient is capable of making decisions on her own behalf. 6. Skin/Wound Care: continued dry dressing with ACE---  7. Fluids/Electrolytes/Nutrition: has good appetite. continue k+ replacement while here -mild hyponatremia---resolved.   8. Prosthetics: continue ACE wrap for left stump-- wound healing  nicely -prosthetic education performed -shrinker perhaps tomorrow LOS (Days) 7 A FACE TO FACE EVALUATION WAS PERFORMED  SWARTZ,ZACHARY T 02/07/2015 8:13 AM

## 2015-02-07 NOTE — Progress Notes (Signed)
Social Work Patient ID: [name], female   DOB: 08-17-1931, 81 y.o.   MRN: [id number]   Have reviewed team conference info with pt and dtr-in-law.  Readying for d/c tomorrow.  Review of HH and DME being ordered.  No concerns.  HOYLE, LUCY, LCSW

## 2015-02-08 MED ORDER — TRAMADOL HCL 50 MG PO TABS: ORAL_TABLET | ORAL | Status: DC

## 2015-02-08 MED ORDER — HYDROCHLOROTHIAZIDE 12.5 MG PO CAPS: 12.5000 mg | ORAL_CAPSULE | Freq: Every day | ORAL | Status: DC

## 2015-02-08 MED ORDER — CLOPIDOGREL BISULFATE 75 MG PO TABS: 75.0000 mg | ORAL_TABLET | Freq: Every day | ORAL | Status: DC

## 2015-02-08 MED ORDER — HYDROCHLOROTHIAZIDE 12.5 MG PO TABS: 12.5000 mg | ORAL_TABLET | Freq: Every day | ORAL | Status: DC

## 2015-02-08 NOTE — Progress Notes (Signed)
[place] PHYSICAL MEDICINE & REHABILITATION     PROGRESS NOTE    Subjective/Complaints: Ready to go home. Feels very prepared!!!  ROS: Pt denies fever, rash/itching, headache, blurred or double vision, nausea, vomiting, abdominal pain, diarrhea, chest pain, shortness of breath, palpitations, dysuria, dizziness, neck or back pain, bleeding, anxiety, or depression   Objective: Vital Signs: Blood pressure 117/75, pulse 76, temperature 97.8 F (36.6 C), temperature source Oral, resp. rate 17, height 5\' 5"  (1.651 m), weight 64.1 kg (141 lb 5 oz), SpO2 99 %. No results found. No results for input(s): WBC, HGB, HCT, PLT in the last 72 hours. No results for input(s): NA, K, CL, GLUCOSE, BUN, CREATININE, CALCIUM in the last 72 hours.  Invalid input(s): CO CBG (last 3)  No results for input(s): GLUCAP in the last 72 hours.  Wt Readings from Last 3 Encounters:  02/07/15 64.1 kg (141 lb 5 oz)  01/29/15 64.864 kg (143 lb)  01/25/15 65.137 kg (143 lb 9.6 oz)    Physical Exam:  Constitutional: She is oriented to person, place, and time.  HENT: oral mucosa is pink and moist Head: Normocephalic.  Eyes: EOM are normal.  Neck: Normal range of motion. Neck supple. No thyromegaly present.  Cardiovascular: Normal rate and regular rhythm. no murmurs or rubs. Respiratory: Effort normal and breath sounds normal. No respiratory distress.  GI: Soft. Bowel sounds are normal. She exhibits no distension.  Musculoskeletal:   edema decreased at stump--good shape still---some swelling still distally Neurological: She is alert and oriented to person, place, and time.  UE 5/5 prox to distal. RLE: 4+/5 hf,ke,adf,apf. LLE: 3/5hF, ke 1+. ?mild sensory loss to LT in right foot below ankle.  Skin:  BKA site well approximated.  Minimal drainage  Psychiatric: She has a normal mood and affect. Her behavior is normal. Judgment and thought content normal   Assessment/Plan: 1. Functional deficits  secondary to left BKA which require 3+ hours per day of interdisciplinary therapy in a comprehensive inpatient rehab setting. Physiatrist is providing close team supervision and 24 hour management of active medical problems listed below. Physiatrist and rehab team continue to assess barriers to discharge/monitor patient progress toward functional and medical goals.  Function:  Bathing Bathing position   Position: Shower  Bathing parts Body parts bathed by patient: Right arm, Left arm, Chest, Abdomen, Right upper leg, Left upper leg, Front perineal area, Buttocks, Right lower leg Body parts bathed by helper: Back  Bathing assist Assist Level: More than reasonable time   Set up : To obtain items  Upper Body Dressing/Undressing Upper body dressing   What is the patient wearing?: Pull over shirt/dress, Bra Bra - Perfomed by patient: Thread/unthread right bra strap, Thread/unthread left bra strap, Hook/unhook bra (pull down sports bra)   Pull over shirt/dress - Perfomed by patient: Thread/unthread right sleeve, Thread/unthread left sleeve, Put head through opening, Pull shirt over trunk          Upper body assist Assist Level: More than reasonable time      Lower Body Dressing/Undressing Lower body dressing   What is the patient wearing?: Underwear, Pants, Shoes, [employer]  - Performed by patient: Thread/unthread right underwear leg, Thread/unthread left underwear leg, Pull underwear up/down   Pants- Performed by patient: Thread/unthread right pants leg, Thread/unthread left pants leg, Pull pants up/down           Shoes - Performed by patient: Don/doff right shoe       TED Hose - Performed by  patient: Don/doff right TED hose TED Hose - Performed by helper: Don/doff right TED hose  Lower body assist Assist Level: Supervision or verbal cues      Toileting Toileting   Toileting steps completed by patient: Adjust clothing prior to toileting, Adjust clothing after  toileting, Performs perineal hygiene Toileting steps completed by helper: Adjust clothing prior to toileting, Adjust clothing after toileting Toileting Assistive Devices: Grab bar or rail  Toileting assist Assist level: More than reasonable time   Transfers Chair/bed transfer   Chair/bed transfer method: Stand pivot, Squat pivot Chair/bed transfer assist level: No Help, no cues, assistive device, takes more than a reasonable amount of time Chair/bed transfer assistive device: Medical sales representative     Max distance: 60 Assist level: Supervision or verbal cues   Wheelchair   Type: Manual Max wheelchair distance: 150 Assist Level: No help, No cues, assistive device, takes more than reasonable amount of time  Cognition Comprehension Comprehension assist level: Follows complex conversation/direction with no assist  Expression Expression assist level: Expresses complex ideas: With no assist  Social Interaction Social Interaction assist level: Interacts appropriately with others - No medications needed.  Problem Solving Problem solving assist level: Solves complex problems: With extra time  Memory Memory assist level: Complete Independence: No helper   Medical Problem List and Plan: 1. Functional deficits secondary to left BKA 01/29/2015  -progressing nicely in therapy--home today!!!  -see me in the office in a month or so. Needs surgical/pcp follow up also 2. DVT Prophylaxis/Anticoagulation: Subcutaneous Lovenox. Monitor platelet counts and any signs of bleeding 3. Pain Management: Tylenol No. 3 and Ultram as needed. Really only needing tylenol currently -pt's phantom pain has resolved. 4. Hypertension. Hydrochlorothiazide 12.5 mg daily. normotensive 5. Neuropsych: This patient is capable of making decisions on her own behalf. 6. Skin/Wound Care: continued dry dressing with ACE---  7. Fluids/Electrolytes/Nutrition: has good appetite. continue k+ replacement  while here -mild hyponatremia---resolved.   8. Prosthetics: continue ACE wrap for left stump-- wound healing nicely -prosthetic education performed -shrinker after discharge. Daughter does a good job with ACE LOS (Days) 8 A FACE TO FACE EVALUATION WAS PERFORMED  SWARTZ,ZACHARY T 02/08/2015 10:18 AM

## 2015-02-08 NOTE — Progress Notes (Signed)
Social Work  Discharge Note  The overall goal for the admission was met for:   Discharge location: Yes - returning to her own home with son and dtr-in-law to stay and share 24/7 assist  Length of Stay: Yes - 8 days  Discharge activity level: Yes - mod ind/ supervision overall  Home/community participation: Yes  Services provided included: MD, RD, PT, OT, RN, TR, Pharmacy and [place]: Medicare and Private Insurance: [facility]  Follow-up services arranged: Home Health: RN, PT, OT via [facility], DME: [phone] lightweight w/c with Left amp support pad, basic cushion, rolling walker, drop arm commoe and tub bench via [facility] and Patient/Family has no preference for HH/DME agencies  Comments (or additional information):  Patient/Family verbalized understanding of follow-up arrangements: Yes  Individual responsible for coordination of the follow-up plan: pt  Confirmed correct DME delivered: HOYLE, LUCY 02/08/2015    HOYLE, LUCY

## 2015-02-08 NOTE — Progress Notes (Signed)
Patient discharged to home, accompanied by her daughter.

## 2015-02-09 DIAGNOSIS — Z89512 Acquired absence of left leg below knee: Secondary | ICD-10-CM | POA: Diagnosis not present

## 2015-02-09 DIAGNOSIS — Z8542 Personal history of malignant neoplasm of other parts of uterus: Secondary | ICD-10-CM | POA: Diagnosis not present

## 2015-02-09 DIAGNOSIS — M1990 Unspecified osteoarthritis, unspecified site: Secondary | ICD-10-CM | POA: Diagnosis not present

## 2015-02-09 DIAGNOSIS — Z4781 Encounter for orthopedic aftercare following surgical amputation: Secondary | ICD-10-CM | POA: Diagnosis not present

## 2015-02-09 DIAGNOSIS — I739 Peripheral vascular disease, unspecified: Secondary | ICD-10-CM | POA: Diagnosis not present

## 2015-02-09 DIAGNOSIS — I10 Essential (primary) hypertension: Secondary | ICD-10-CM | POA: Diagnosis not present

## 2015-02-10 DIAGNOSIS — M1990 Unspecified osteoarthritis, unspecified site: Secondary | ICD-10-CM | POA: Diagnosis not present

## 2015-02-10 DIAGNOSIS — I739 Peripheral vascular disease, unspecified: Secondary | ICD-10-CM | POA: Diagnosis not present

## 2015-02-10 DIAGNOSIS — Z4781 Encounter for orthopedic aftercare following surgical amputation: Secondary | ICD-10-CM | POA: Diagnosis not present

## 2015-02-10 DIAGNOSIS — Z8542 Personal history of malignant neoplasm of other parts of uterus: Secondary | ICD-10-CM | POA: Diagnosis not present

## 2015-02-10 DIAGNOSIS — Z89512 Acquired absence of left leg below knee: Secondary | ICD-10-CM | POA: Diagnosis not present

## 2015-02-10 DIAGNOSIS — I10 Essential (primary) hypertension: Secondary | ICD-10-CM | POA: Diagnosis not present

## 2015-02-12 DIAGNOSIS — Z8542 Personal history of malignant neoplasm of other parts of uterus: Secondary | ICD-10-CM | POA: Diagnosis not present

## 2015-02-12 DIAGNOSIS — Z89512 Acquired absence of left leg below knee: Secondary | ICD-10-CM | POA: Diagnosis not present

## 2015-02-12 DIAGNOSIS — Z4781 Encounter for orthopedic aftercare following surgical amputation: Secondary | ICD-10-CM | POA: Diagnosis not present

## 2015-02-12 DIAGNOSIS — M1990 Unspecified osteoarthritis, unspecified site: Secondary | ICD-10-CM | POA: Diagnosis not present

## 2015-02-12 DIAGNOSIS — I739 Peripheral vascular disease, unspecified: Secondary | ICD-10-CM | POA: Diagnosis not present

## 2015-02-12 DIAGNOSIS — I10 Essential (primary) hypertension: Secondary | ICD-10-CM | POA: Diagnosis not present

## 2015-02-13 DIAGNOSIS — Z89512 Acquired absence of left leg below knee: Secondary | ICD-10-CM | POA: Diagnosis not present

## 2015-02-13 DIAGNOSIS — M1990 Unspecified osteoarthritis, unspecified site: Secondary | ICD-10-CM | POA: Diagnosis not present

## 2015-02-13 DIAGNOSIS — Z8542 Personal history of malignant neoplasm of other parts of uterus: Secondary | ICD-10-CM | POA: Diagnosis not present

## 2015-02-13 DIAGNOSIS — I739 Peripheral vascular disease, unspecified: Secondary | ICD-10-CM | POA: Diagnosis not present

## 2015-02-13 DIAGNOSIS — I10 Essential (primary) hypertension: Secondary | ICD-10-CM | POA: Diagnosis not present

## 2015-02-13 DIAGNOSIS — Z4781 Encounter for orthopedic aftercare following surgical amputation: Secondary | ICD-10-CM | POA: Diagnosis not present

## 2015-02-14 ENCOUNTER — Encounter: Payer: Self-pay | Admitting: Family Medicine

## 2015-02-14 ENCOUNTER — Ambulatory Visit (INDEPENDENT_AMBULATORY_CARE_PROVIDER_SITE_OTHER): Payer: Medicare Other | Admitting: Family Medicine

## 2015-02-14 VITALS — BP 140/74 | HR 78 | Temp 98.2°F | Resp 16

## 2015-02-14 DIAGNOSIS — Z89512 Acquired absence of left leg below knee: Secondary | ICD-10-CM | POA: Diagnosis not present

## 2015-02-14 DIAGNOSIS — Z09 Encounter for follow-up examination after completed treatment for conditions other than malignant neoplasm: Secondary | ICD-10-CM | POA: Diagnosis not present

## 2015-02-14 DIAGNOSIS — I739 Peripheral vascular disease, unspecified: Secondary | ICD-10-CM

## 2015-02-14 DIAGNOSIS — L98499 Non-pressure chronic ulcer of skin of other sites with unspecified severity: Secondary | ICD-10-CM

## 2015-02-14 NOTE — Patient Instructions (Signed)
It was great to see you today- it looks like you are making a wonderful recovery Let me know if you have any trouble or if you need pain medication Ask Dr. Nona Dell if you need to continue to Plavix long term

## 2015-02-14 NOTE — Progress Notes (Signed)
[facility] [address] [place] [address] [phone]  Date:  02/14/2015   Name:  [name]   DOB:  [birth year]   MRN:  [id number]  PCP:  Lamar Blinks, MD    Chief Complaint: Follow-up   History of Present Illness:  [name] is a 81 y.o. very pleasant female patient who presents with the following:  Here today to follow-up from a LEFT BKA that was performed on 9/21.  She was in rehab from 9/21 until 9/29, now back home She is really doing great- she was able to leave rehab much faster than anticipated, and continues to have a great attitude  She has very little pain, using tramadol as needed She is on plavix per the wound care center They will see Dr. Oneida Alar for post-op follow-up on 10/20; will ask him about continuing plavix.  It sounds like they will fit her prosthesis then  They also plan to amputate her right LE at some point apparently due to her vascular disease  Patient Active Problem List   Diagnosis Date Noted  . Status post below knee amputation of left lower extremity ([place]) 01/31/2015  . Non-healing wound of lower extremity 01/29/2015  . Ulcer of toe of left foot ([place]) 12/27/2014  . Essential hypertension 08/22/2014    Past Medical History  Diagnosis Date  . Hypertension   . PAD (peripheral artery disease) ([place])   . Arthritis   . Cancer ([place])     utertrine    Past Surgical History  Procedure Laterality Date  . Spine surgery    . Knee surgery Right 1984  . Peripheral vascular catheterization N/A 09/15/2014    Procedure: Abdominal Aortogram;  Surgeon: Elam Dutch, MD;  Location: [facility] INVASIVE CV LAB CUPID;  Service: Cardiovascular;  Laterality: N/A;  . Peripheral vascular catheterization Bilateral 09/15/2014    Procedure: Lower Extremity Angiography;  Surgeon: Elam Dutch, MD;  Location: [place];  Service: Cardiovascular;  Laterality: Bilateral;  . Abdominal hysterectomy      partial  .  Amputation Left 01/29/2015    Procedure: Left  AMPUTATION BELOW KNEE;  Surgeon: Elam Dutch, MD;  Location: [facility] OR;  Service: Vascular;  Laterality: Left;    Social History  Substance Use Topics  . Smoking status: Never Smoker   . Smokeless tobacco: Never Used  . Alcohol Use: No    Family History  Problem Relation Age of Onset  . Diabetes Mother   . Heart disease Mother   . Dementia Sister   . Diabetes Son     No Known Allergies  Medication list has been reviewed and updated.  Current Outpatient Prescriptions on File Prior to Visit  Medication Sig Dispense Refill  . clopidogrel (PLAVIX) 75 MG tablet Take 1 tablet (75 mg total) by mouth daily. 30 tablet 2  . hydrochlorothiazide (HYDRODIURIL) 12.5 MG tablet Take 1 tablet (12.5 mg total) by mouth daily. 30 tablet 3  . hydrochlorothiazide (MICROZIDE) 12.5 MG capsule Take 1 capsule (12.5 mg total) by mouth daily. 30 capsule 3  . Polyethyl Glycol-Propyl Glycol (SYSTANE FREE OP) Place 1 drop into both eyes daily. For dry eyes    . traMADol (ULTRAM) 50 MG tablet Take 1 or 2 every 4-6 hours as needed for pain.  Max 300 mg (6 pills) in 24 hours 90 tablet 1   No current facility-administered medications on file prior to visit.    Review of Systems:  As per HPI- otherwise negative.   Physical Examination: Filed Vitals:   02/14/15 1352  BP: 140/74  Pulse: 78  Temp: 98.2 F (36.8 C)  Resp: 16   Filed Vitals:   There is no weight on file to calculate BMI. Ideal Body Weight:    GEN: WDWN, NAD, Non-toxic, A & O x 3, looks well, in her normal good spirits.  Accompanied by her DIL as usual  HEENT: Atraumatic, Normocephalic. Neck supple. No masses, No LAD. Ears and Nose: No external deformity. CV: RRR, No M/G/R. No JVD. No thrill. No extra heart sounds. PULM: CTA B, no wheezes, crackles, rhonchi. No retractions. No resp. distress. No accessory muscle use. EXTR: No c/c/e NEURO in wheelchair PSYCH: Normally interactive.  Conversant. Not depressed or anxious appearing.  Calm demeanor.  Examined left BKA stump - it appears to be healing very well, no sign of infection, very little tenderness, normal sensation Dressed with a non-stick pad, tube gauze and an ace- wrap   Assessment and Plan: Status post below knee amputation of left lower extremity [facility])  Hospital discharge follow-up  Flu shot done in the hospital Doing very well after left BKA Plans to see Dr. Nona Dell soon  Signed Lamar Blinks, MD

## 2015-02-15 DIAGNOSIS — M1990 Unspecified osteoarthritis, unspecified site: Secondary | ICD-10-CM | POA: Diagnosis not present

## 2015-02-15 DIAGNOSIS — Z4781 Encounter for orthopedic aftercare following surgical amputation: Secondary | ICD-10-CM | POA: Diagnosis not present

## 2015-02-15 DIAGNOSIS — I739 Peripheral vascular disease, unspecified: Secondary | ICD-10-CM | POA: Diagnosis not present

## 2015-02-15 DIAGNOSIS — Z8542 Personal history of malignant neoplasm of other parts of uterus: Secondary | ICD-10-CM | POA: Diagnosis not present

## 2015-02-15 DIAGNOSIS — Z89512 Acquired absence of left leg below knee: Secondary | ICD-10-CM | POA: Diagnosis not present

## 2015-02-15 DIAGNOSIS — I10 Essential (primary) hypertension: Secondary | ICD-10-CM | POA: Diagnosis not present

## 2015-02-16 ENCOUNTER — Other Ambulatory Visit: Payer: Self-pay | Admitting: Urgent Care

## 2015-02-16 DIAGNOSIS — M1990 Unspecified osteoarthritis, unspecified site: Secondary | ICD-10-CM | POA: Diagnosis not present

## 2015-02-16 DIAGNOSIS — Z89512 Acquired absence of left leg below knee: Secondary | ICD-10-CM | POA: Diagnosis not present

## 2015-02-16 DIAGNOSIS — I10 Essential (primary) hypertension: Secondary | ICD-10-CM | POA: Diagnosis not present

## 2015-02-16 DIAGNOSIS — Z4781 Encounter for orthopedic aftercare following surgical amputation: Secondary | ICD-10-CM | POA: Diagnosis not present

## 2015-02-16 DIAGNOSIS — Z8542 Personal history of malignant neoplasm of other parts of uterus: Secondary | ICD-10-CM | POA: Diagnosis not present

## 2015-02-16 DIAGNOSIS — I739 Peripheral vascular disease, unspecified: Secondary | ICD-10-CM | POA: Diagnosis not present

## 2015-02-20 DIAGNOSIS — I10 Essential (primary) hypertension: Secondary | ICD-10-CM | POA: Diagnosis not present

## 2015-02-20 DIAGNOSIS — Z4781 Encounter for orthopedic aftercare following surgical amputation: Secondary | ICD-10-CM | POA: Diagnosis not present

## 2015-02-20 DIAGNOSIS — M1990 Unspecified osteoarthritis, unspecified site: Secondary | ICD-10-CM | POA: Diagnosis not present

## 2015-02-20 DIAGNOSIS — I739 Peripheral vascular disease, unspecified: Secondary | ICD-10-CM | POA: Diagnosis not present

## 2015-02-20 DIAGNOSIS — Z8542 Personal history of malignant neoplasm of other parts of uterus: Secondary | ICD-10-CM | POA: Diagnosis not present

## 2015-02-20 DIAGNOSIS — Z89512 Acquired absence of left leg below knee: Secondary | ICD-10-CM | POA: Diagnosis not present

## 2015-02-21 DIAGNOSIS — I10 Essential (primary) hypertension: Secondary | ICD-10-CM | POA: Diagnosis not present

## 2015-02-21 DIAGNOSIS — Z8542 Personal history of malignant neoplasm of other parts of uterus: Secondary | ICD-10-CM | POA: Diagnosis not present

## 2015-02-21 DIAGNOSIS — I739 Peripheral vascular disease, unspecified: Secondary | ICD-10-CM | POA: Diagnosis not present

## 2015-02-21 DIAGNOSIS — M1990 Unspecified osteoarthritis, unspecified site: Secondary | ICD-10-CM | POA: Diagnosis not present

## 2015-02-21 DIAGNOSIS — Z89512 Acquired absence of left leg below knee: Secondary | ICD-10-CM | POA: Diagnosis not present

## 2015-02-21 DIAGNOSIS — Z4781 Encounter for orthopedic aftercare following surgical amputation: Secondary | ICD-10-CM | POA: Diagnosis not present

## 2015-02-22 DIAGNOSIS — Z89512 Acquired absence of left leg below knee: Secondary | ICD-10-CM | POA: Diagnosis not present

## 2015-02-22 DIAGNOSIS — I739 Peripheral vascular disease, unspecified: Secondary | ICD-10-CM | POA: Diagnosis not present

## 2015-02-22 DIAGNOSIS — M1990 Unspecified osteoarthritis, unspecified site: Secondary | ICD-10-CM | POA: Diagnosis not present

## 2015-02-22 DIAGNOSIS — Z8542 Personal history of malignant neoplasm of other parts of uterus: Secondary | ICD-10-CM | POA: Diagnosis not present

## 2015-02-22 DIAGNOSIS — Z4781 Encounter for orthopedic aftercare following surgical amputation: Secondary | ICD-10-CM | POA: Diagnosis not present

## 2015-02-22 DIAGNOSIS — I10 Essential (primary) hypertension: Secondary | ICD-10-CM | POA: Diagnosis not present

## 2015-02-27 ENCOUNTER — Encounter: Payer: Self-pay | Admitting: Vascular Surgery

## 2015-02-27 DIAGNOSIS — I739 Peripheral vascular disease, unspecified: Secondary | ICD-10-CM | POA: Diagnosis not present

## 2015-02-27 DIAGNOSIS — Z4781 Encounter for orthopedic aftercare following surgical amputation: Secondary | ICD-10-CM | POA: Diagnosis not present

## 2015-02-27 DIAGNOSIS — Z8542 Personal history of malignant neoplasm of other parts of uterus: Secondary | ICD-10-CM | POA: Diagnosis not present

## 2015-02-27 DIAGNOSIS — I10 Essential (primary) hypertension: Secondary | ICD-10-CM | POA: Diagnosis not present

## 2015-02-27 DIAGNOSIS — M1990 Unspecified osteoarthritis, unspecified site: Secondary | ICD-10-CM | POA: Diagnosis not present

## 2015-02-27 DIAGNOSIS — Z89512 Acquired absence of left leg below knee: Secondary | ICD-10-CM | POA: Diagnosis not present

## 2015-03-01 ENCOUNTER — Telehealth: Payer: Self-pay

## 2015-03-01 ENCOUNTER — Ambulatory Visit (INDEPENDENT_AMBULATORY_CARE_PROVIDER_SITE_OTHER): Payer: Medicare Other | Admitting: Vascular Surgery

## 2015-03-01 ENCOUNTER — Encounter: Payer: Self-pay | Admitting: Vascular Surgery

## 2015-03-01 VITALS — BP 128/73 | HR 67 | Ht 65.0 in | Wt 141.0 lb

## 2015-03-01 DIAGNOSIS — I739 Peripheral vascular disease, unspecified: Secondary | ICD-10-CM

## 2015-03-01 NOTE — Progress Notes (Signed)
Patient is an 81 year old female who is status post left below-knee amputation 01/26/2015. She is doing well overall. She has had no drainage from the incision. 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 Well-healed left below-knee amputation.  Plan: Follow-up on as-needed basis. If she develops wounds in her right foot or severe rest pain in the right foot we would need to consider a right below-knee amputation. Hopefully we will avoid this. She is progressing in her rehabilitation hopefully will have a prosthetic leg on the left side soon. She was given a prescription for shrinker today.  [name], MD [facility] Office: [phone] Pager: [phone]

## 2015-03-01 NOTE — Telephone Encounter (Signed)
Wants Dr. Lorelei Pont to know that she saw the dr today and he wants her to continue with the pivet they removed staples and the [facility] put a shrinker on and she's doing great.

## 2015-03-02 DIAGNOSIS — I10 Essential (primary) hypertension: Secondary | ICD-10-CM | POA: Diagnosis not present

## 2015-03-02 DIAGNOSIS — M1990 Unspecified osteoarthritis, unspecified site: Secondary | ICD-10-CM | POA: Diagnosis not present

## 2015-03-02 DIAGNOSIS — Z4781 Encounter for orthopedic aftercare following surgical amputation: Secondary | ICD-10-CM | POA: Diagnosis not present

## 2015-03-02 DIAGNOSIS — Z89512 Acquired absence of left leg below knee: Secondary | ICD-10-CM | POA: Diagnosis not present

## 2015-03-02 DIAGNOSIS — Z8542 Personal history of malignant neoplasm of other parts of uterus: Secondary | ICD-10-CM | POA: Diagnosis not present

## 2015-03-02 DIAGNOSIS — I739 Peripheral vascular disease, unspecified: Secondary | ICD-10-CM | POA: Diagnosis not present

## 2015-03-06 DIAGNOSIS — M1990 Unspecified osteoarthritis, unspecified site: Secondary | ICD-10-CM | POA: Diagnosis not present

## 2015-03-06 DIAGNOSIS — Z89512 Acquired absence of left leg below knee: Secondary | ICD-10-CM | POA: Diagnosis not present

## 2015-03-06 DIAGNOSIS — Z8542 Personal history of malignant neoplasm of other parts of uterus: Secondary | ICD-10-CM | POA: Diagnosis not present

## 2015-03-06 DIAGNOSIS — Z4781 Encounter for orthopedic aftercare following surgical amputation: Secondary | ICD-10-CM | POA: Diagnosis not present

## 2015-03-06 DIAGNOSIS — I10 Essential (primary) hypertension: Secondary | ICD-10-CM | POA: Diagnosis not present

## 2015-03-06 DIAGNOSIS — I739 Peripheral vascular disease, unspecified: Secondary | ICD-10-CM | POA: Diagnosis not present

## 2015-03-07 DIAGNOSIS — I10 Essential (primary) hypertension: Secondary | ICD-10-CM | POA: Diagnosis not present

## 2015-03-07 DIAGNOSIS — Z8542 Personal history of malignant neoplasm of other parts of uterus: Secondary | ICD-10-CM | POA: Diagnosis not present

## 2015-03-07 DIAGNOSIS — Z4781 Encounter for orthopedic aftercare following surgical amputation: Secondary | ICD-10-CM | POA: Diagnosis not present

## 2015-03-07 DIAGNOSIS — Z89512 Acquired absence of left leg below knee: Secondary | ICD-10-CM | POA: Diagnosis not present

## 2015-03-07 DIAGNOSIS — M1990 Unspecified osteoarthritis, unspecified site: Secondary | ICD-10-CM | POA: Diagnosis not present

## 2015-03-07 DIAGNOSIS — I739 Peripheral vascular disease, unspecified: Secondary | ICD-10-CM | POA: Diagnosis not present

## 2015-03-09 DIAGNOSIS — Z4781 Encounter for orthopedic aftercare following surgical amputation: Secondary | ICD-10-CM | POA: Diagnosis not present

## 2015-03-09 DIAGNOSIS — I10 Essential (primary) hypertension: Secondary | ICD-10-CM | POA: Diagnosis not present

## 2015-03-09 DIAGNOSIS — Z89512 Acquired absence of left leg below knee: Secondary | ICD-10-CM | POA: Diagnosis not present

## 2015-03-09 DIAGNOSIS — I739 Peripheral vascular disease, unspecified: Secondary | ICD-10-CM | POA: Diagnosis not present

## 2015-03-13 ENCOUNTER — Encounter: Payer: Self-pay | Admitting: Physical Medicine & Rehabilitation

## 2015-03-13 ENCOUNTER — Encounter: Payer: Medicare Other | Attending: Physical Medicine & Rehabilitation | Admitting: Physical Medicine & Rehabilitation

## 2015-03-13 VITALS — BP 152/81 | HR 72 | Resp 16

## 2015-03-13 DIAGNOSIS — Z89512 Acquired absence of left leg below knee: Secondary | ICD-10-CM | POA: Insufficient documentation

## 2015-03-13 DIAGNOSIS — I10 Essential (primary) hypertension: Secondary | ICD-10-CM | POA: Insufficient documentation

## 2015-03-13 DIAGNOSIS — C55 Malignant neoplasm of uterus, part unspecified: Secondary | ICD-10-CM | POA: Diagnosis not present

## 2015-03-13 DIAGNOSIS — I739 Peripheral vascular disease, unspecified: Secondary | ICD-10-CM | POA: Diagnosis not present

## 2015-03-13 DIAGNOSIS — M1990 Unspecified osteoarthritis, unspecified site: Secondary | ICD-10-CM | POA: Insufficient documentation

## 2015-03-13 DIAGNOSIS — L98499 Non-pressure chronic ulcer of skin of other sites with unspecified severity: Secondary | ICD-10-CM

## 2015-03-13 NOTE — Progress Notes (Signed)
Subjective:    Patient ID: [name], female    DOB: Oct 21, 1931, 81 y.o.   MRN: [id number]  HPI   [name] is here in follow up of her left BKA. She is progressing with her mobility and sees [facility] next week for prosthetic fitting. She is walking with the walker and using her w/c at home for mobility.  She uses the w/c for longer distances. She has no pain. [name] is independent for home chores and self-care within the home.  She wants to get out in the community, in the garden with her future prosthesis. She is very motivated to use it!  She had her staples remoeved last week. The incision has been healing nicely except for a spot laterally.   Pain Inventory Average Pain 0 Pain Right Now 0 My pain is NA  In the last 24 hours, has pain interfered with the following? General activity 0 Relation with others 0 Enjoyment of life 0 What TIME of day is your pain at its worst? NA Sleep (in general) Good  Pain is worse with: NA Pain improves with: NA Relief from Meds: Not using any medications, no pain  Mobility walk without assistance use a wheelchair Do you have any goals in this area?  yes  Function disabled: date disabled NA I need assistance with the following:  household duties and shopping Do you have any goals in this area?  yes  Neuro/Psych No problems in this area  Prior Studies Any changes since last visit?  no  Physicians involved in your care Any changes since last visit?  no   Family History  Problem Relation Age of Onset  . Diabetes Mother   . Heart disease Mother   . Dementia Sister   . Diabetes Son    Social History   Social History  . Marital Status: Widowed    Spouse Name: N/A  . Number of Children: N/A  . Years of Education: N/A   Social History Main Topics  . Smoking status: Never Smoker   . Smokeless tobacco: Never Used  . Alcohol Use: No  . Drug Use: No  . Sexual Activity: Not Asked   Other Topics Concern  . None     Social History Narrative   Past Surgical History  Procedure Laterality Date  . Spine surgery    . Knee surgery Right 1984  . Peripheral vascular catheterization N/A 09/15/2014    Procedure: Abdominal Aortogram;  Surgeon: Elam Dutch, MD;  Location: [facility] INVASIVE CV LAB CUPID;  Service: Cardiovascular;  Laterality: N/A;  . Peripheral vascular catheterization Bilateral 09/15/2014    Procedure: Lower Extremity Angiography;  Surgeon: Elam Dutch, MD;  Location: [place];  Service: Cardiovascular;  Laterality: Bilateral;  . Abdominal hysterectomy      partial  . Amputation Left 01/29/2015    Procedure: Left  AMPUTATION BELOW KNEE;  Surgeon: Elam Dutch, MD;  Location: [place];  Service: Vascular;  Laterality: Left;   Past Medical History  Diagnosis Date  . Hypertension   . PAD (peripheral artery disease) ([place])   . Arthritis   . Cancer ([place])     utertrine   BP 152/81 mmHg  Pulse 72  Resp 16  SpO2 99%  Opioid Risk Score:   Fall Risk Score:  `1  Depression screen PHQ 2/9  Depression screen [facility] 2/9 03/13/2015 02/14/2015 11/16/2014 10/16/2014 09/11/2014  Decreased Interest 0 0 0 0 0  Down, Depressed, Hopeless  0 0 0 0 0  PHQ - 2 Score 0 0 0 0 0     Review of Systems  All other systems reviewed and are negative.      Objective:   Physical Exam  Constitutional: She is oriented to person, place, and time.  HENT: oral mucosa is pink and moist Head: Normocephalic.  Eyes: EOM are normal.  Neck: Normal range of motion. Neck supple. No thyromegaly present.  Cardiovascular: Normal rate and regular rhythm. no murmurs or rubs. Respiratory: Effort normal and breath sounds normal. No respiratory distress.  GI: Soft. Bowel sounds are normal. She exhibits no distension.  Musculoskeletal:  Limb perfectly shaped. She is wearing shrinker correctly Neurological: She is alert and oriented to person, place, and time.  UE 5/5 prox to distal. RLE: 55 hf,ke,adf,apf.  LLE: 5/5hF,ke 4/5. ?mild sensory loss to LT in right foot below ankle. she is able to stand unassisted at w/c Skin:  BKA wound closed except for small area medial with fibronecrotic tissue---only about a centimeter in diameter. Appears to have some retained subq suture also within it. Psychiatric: She has a normal mood and affect. Her behavior is normal. Judgment and thought content normal   Assessment/Plan:  1. Functional deficits secondary to left BKA 01/29/2015 -she is a K3 ambulator. Prosthetic rx was written today -needs another week or two for wound to close but can begin fitting process as scheduled  2. Pain Management:  No pain at this point 3. Wound- mild wet to dry dressing to medial area daily until incision clean 5. Neuropsych: This patient is capable of making decisions on her own behalf. 6. Skin/Wound Care: continued dry dressing with ACE---   Follow up with me PRN.

## 2015-03-13 NOTE — Patient Instructions (Signed)
YOU CAN USE SMALL, SLIGHTLY DAMP DRESSING TO OPEN AREA ON YOUR INCISION UNTIL YELLOW MATERIAL DISAPPEARS AND THE UNDERLYING PINK TISSUE APPEARS---- YOU MAY CUT THE RESIDUAL SUTURE DOWN TO THE LEVEL OF THE SKIN.

## 2015-03-14 ENCOUNTER — Telehealth: Payer: Self-pay | Admitting: *Deleted

## 2015-03-14 DIAGNOSIS — M1990 Unspecified osteoarthritis, unspecified site: Secondary | ICD-10-CM | POA: Diagnosis not present

## 2015-03-14 DIAGNOSIS — Z4781 Encounter for orthopedic aftercare following surgical amputation: Secondary | ICD-10-CM | POA: Diagnosis not present

## 2015-03-14 DIAGNOSIS — Z89512 Acquired absence of left leg below knee: Secondary | ICD-10-CM | POA: Diagnosis not present

## 2015-03-14 DIAGNOSIS — I739 Peripheral vascular disease, unspecified: Secondary | ICD-10-CM | POA: Diagnosis not present

## 2015-03-14 DIAGNOSIS — Z8542 Personal history of malignant neoplasm of other parts of uterus: Secondary | ICD-10-CM | POA: Diagnosis not present

## 2015-03-14 DIAGNOSIS — I10 Essential (primary) hypertension: Secondary | ICD-10-CM | POA: Diagnosis not present

## 2015-03-14 NOTE — Telephone Encounter (Signed)
Larena Glassman, Nurse from [facility] called for verbal orders for 3 additional visits once a week for the next 3 weeks. Wound care help and observation.  Verbal orders given per office protocol

## 2015-03-14 NOTE — Telephone Encounter (Signed)
Pt's daughter called for clarification on instructions on how do wound care bandage change. I called the pt back for to get more details.  While speaking to the pt's daughter, Dr. Naaman Plummer overheard and gave specifics on how to manage wound care. Pt could hear is instructions and understood. I reiterated what he said.

## 2015-03-23 DIAGNOSIS — I10 Essential (primary) hypertension: Secondary | ICD-10-CM | POA: Diagnosis not present

## 2015-03-23 DIAGNOSIS — I739 Peripheral vascular disease, unspecified: Secondary | ICD-10-CM | POA: Diagnosis not present

## 2015-03-23 DIAGNOSIS — Z8542 Personal history of malignant neoplasm of other parts of uterus: Secondary | ICD-10-CM | POA: Diagnosis not present

## 2015-03-23 DIAGNOSIS — Z4781 Encounter for orthopedic aftercare following surgical amputation: Secondary | ICD-10-CM | POA: Diagnosis not present

## 2015-03-23 DIAGNOSIS — Z89512 Acquired absence of left leg below knee: Secondary | ICD-10-CM | POA: Diagnosis not present

## 2015-03-23 DIAGNOSIS — M1990 Unspecified osteoarthritis, unspecified site: Secondary | ICD-10-CM | POA: Diagnosis not present

## 2015-03-29 ENCOUNTER — Telehealth: Payer: Self-pay | Admitting: Physical Medicine & Rehabilitation

## 2015-03-29 DIAGNOSIS — Z89512 Acquired absence of left leg below knee: Secondary | ICD-10-CM | POA: Diagnosis not present

## 2015-03-29 DIAGNOSIS — Z4781 Encounter for orthopedic aftercare following surgical amputation: Secondary | ICD-10-CM | POA: Diagnosis not present

## 2015-03-29 DIAGNOSIS — I10 Essential (primary) hypertension: Secondary | ICD-10-CM | POA: Diagnosis not present

## 2015-03-29 DIAGNOSIS — I739 Peripheral vascular disease, unspecified: Secondary | ICD-10-CM | POA: Diagnosis not present

## 2015-03-29 DIAGNOSIS — M1990 Unspecified osteoarthritis, unspecified site: Secondary | ICD-10-CM | POA: Diagnosis not present

## 2015-03-29 DIAGNOSIS — Z8542 Personal history of malignant neoplasm of other parts of uterus: Secondary | ICD-10-CM | POA: Diagnosis not present

## 2015-03-29 NOTE — Telephone Encounter (Signed)
Larena Glassman RN with [facility] has noticed a small area that is very irritated and red around suture area.  Would like to know what to suggest for patient.  Patient also had an appointment with prosthetic office yesterday and they also noticed the same thing.  Please call Larena Glassman at [phone].

## 2015-03-30 NOTE — Telephone Encounter (Signed)
LMTCB

## 2015-03-30 NOTE — Telephone Encounter (Signed)
i think this the area we looked at when she was last here. Probably would be best for her to see surgeon, to see if there is suture which needs to be removed. It looked like absorbable suture, but there may be a retained piece in that area which needs to come out. If she can't get in to see the surgeon, we can have her come in to the office---[name] can see, and i'll pop my head in to look at wound.

## 2015-04-01 DIAGNOSIS — M1990 Unspecified osteoarthritis, unspecified site: Secondary | ICD-10-CM | POA: Diagnosis not present

## 2015-04-01 DIAGNOSIS — I739 Peripheral vascular disease, unspecified: Secondary | ICD-10-CM | POA: Diagnosis not present

## 2015-04-01 DIAGNOSIS — Z4781 Encounter for orthopedic aftercare following surgical amputation: Secondary | ICD-10-CM | POA: Diagnosis not present

## 2015-04-01 DIAGNOSIS — Z8542 Personal history of malignant neoplasm of other parts of uterus: Secondary | ICD-10-CM | POA: Diagnosis not present

## 2015-04-01 DIAGNOSIS — I10 Essential (primary) hypertension: Secondary | ICD-10-CM | POA: Diagnosis not present

## 2015-04-01 DIAGNOSIS — Z89512 Acquired absence of left leg below knee: Secondary | ICD-10-CM | POA: Diagnosis not present

## 2015-04-02 ENCOUNTER — Telehealth: Payer: Self-pay

## 2015-04-02 NOTE — Telephone Encounter (Signed)
Appointment made

## 2015-04-02 NOTE — Telephone Encounter (Signed)
I spoke with sister. I said she could come in at 0900 tomorrow for me to look at the wound. Unfortuntately, she lost her brother on Friday and they are making funeral arrangements. They will call if they can do that time.

## 2015-04-03 ENCOUNTER — Encounter: Payer: Self-pay | Admitting: Physical Medicine & Rehabilitation

## 2015-04-03 ENCOUNTER — Encounter (HOSPITAL_BASED_OUTPATIENT_CLINIC_OR_DEPARTMENT_OTHER): Payer: Medicare Other | Admitting: Physical Medicine & Rehabilitation

## 2015-04-03 VITALS — BP 161/86 | HR 72

## 2015-04-03 DIAGNOSIS — Z89512 Acquired absence of left leg below knee: Secondary | ICD-10-CM | POA: Diagnosis not present

## 2015-04-03 DIAGNOSIS — L98499 Non-pressure chronic ulcer of skin of other sites with unspecified severity: Secondary | ICD-10-CM

## 2015-04-03 DIAGNOSIS — Z4781 Encounter for orthopedic aftercare following surgical amputation: Secondary | ICD-10-CM | POA: Diagnosis not present

## 2015-04-03 DIAGNOSIS — I739 Peripheral vascular disease, unspecified: Secondary | ICD-10-CM | POA: Diagnosis not present

## 2015-04-03 DIAGNOSIS — S81802S Unspecified open wound, left lower leg, sequela: Secondary | ICD-10-CM | POA: Diagnosis not present

## 2015-04-03 DIAGNOSIS — Z8542 Personal history of malignant neoplasm of other parts of uterus: Secondary | ICD-10-CM | POA: Diagnosis not present

## 2015-04-03 DIAGNOSIS — I10 Essential (primary) hypertension: Secondary | ICD-10-CM | POA: Diagnosis not present

## 2015-04-03 DIAGNOSIS — C55 Malignant neoplasm of uterus, part unspecified: Secondary | ICD-10-CM | POA: Diagnosis not present

## 2015-04-03 DIAGNOSIS — M1990 Unspecified osteoarthritis, unspecified site: Secondary | ICD-10-CM | POA: Diagnosis not present

## 2015-04-03 NOTE — Patient Instructions (Signed)
KEEP WOUND DRY. NO STANDING OR WALKING WITH PROSTHESIS, BUT YOU MAY WEAR TO BECOME ACCUSTOMED TO FEEL OF SOCKET

## 2015-04-03 NOTE — Progress Notes (Signed)
Subjective:    Patient ID: [name], female    DOB: 12/24/1931, 81 y.o.   MRN: [id number]  HPI   [name] is here in follow up of her left leg wound. She has had an area which has been slow to heal and is associated with the area we looked at during her last visit.    She just lost her son on Friday and is dealing with the emotional aspects of this as expected   Pain Inventory Average Pain 0 Pain Right Now 0 My pain is aching  In the last 24 hours, has pain interfered with the following? General activity 1 Relation with others 0 Enjoyment of life 1 What TIME of day is your pain at its worst? daytime Sleep (in general) Good  Pain is worse with: standing Pain improves with: rest and medication Relief from Meds: 0  Mobility ability to climb steps?  no do you drive?  no use a wheelchair  Function not employed: date last employed . Do you have any goals in this area?  no  Neuro/Psych No problems in this area  Prior Studies Any changes since last visit?  no  Physicians involved in your care Any changes since last visit?  no   Family History  Problem Relation Age of Onset  . Diabetes Mother   . Heart disease Mother   . Dementia Sister   . Diabetes Son    Social History   Social History  . Marital Status: Widowed    Spouse Name: N/A  . Number of Children: N/A  . Years of Education: N/A   Social History Main Topics  . Smoking status: Never Smoker   . Smokeless tobacco: Never Used  . Alcohol Use: No  . Drug Use: No  . Sexual Activity: Not Asked   Other Topics Concern  . None   Social History Narrative   Past Surgical History  Procedure Laterality Date  . Spine surgery    . Knee surgery Right 1984  . Peripheral vascular catheterization N/A 09/15/2014    Procedure: Abdominal Aortogram;  Surgeon: Elam Dutch, MD;  Location: [facility] INVASIVE CV LAB CUPID;  Service: Cardiovascular;  Laterality: N/A;  . Peripheral vascular catheterization  Bilateral 09/15/2014    Procedure: Lower Extremity Angiography;  Surgeon: Elam Dutch, MD;  Location: [place];  Service: Cardiovascular;  Laterality: Bilateral;  . Abdominal hysterectomy      partial  . Amputation Left 01/29/2015    Procedure: Left  AMPUTATION BELOW KNEE;  Surgeon: Elam Dutch, MD;  Location: [place];  Service: Vascular;  Laterality: Left;   Past Medical History  Diagnosis Date  . Hypertension   . PAD (peripheral artery disease) ([place])   . Arthritis   . Cancer ([place])     utertrine   BP 161/86 mmHg  Pulse 72  SpO2 98%  Opioid Risk Score:   Fall Risk Score:  `1  Depression screen PHQ 2/9  Depression screen [facility] 2/9 03/13/2015 02/14/2015 11/16/2014 10/16/2014 09/11/2014  Decreased Interest 0 0 0 0 0  Down, Depressed, Hopeless 0 0 0 0 0  PHQ - 2 Score 0 0 0 0 0       Review of Systems  All other systems reviewed and are negative.      Objective:   Physical Exam  Constitutional: She is oriented to person, place, and time.  HENT: oral mucosa is pink and moist  Head: Normocephalic.  Eyes:  EOM are normal.  Neck: Normal range of motion. Neck supple. No thyromegaly present.  Cardiovascular: Normal rate and regular rhythm. no murmurs or rubs.  Respiratory: Effort normal and breath sounds normal. No respiratory distress.  GI: Soft. Bowel sounds are normal. She exhibits no distension.  Musculoskeletal:  Limb perfectly shaped. She is wearing shrinker correctly  Neurological: She is alert and oriented to person, place, and time.  UE 5/5 prox to distal. RLE: 55 hf,ke,adf,apf. LLE: 5/5hF,ke 4/5. ?mild sensory loss to LT in right foot below ankle. she is able to stand unassisted at w/c  Skin:  BKA wound with 0.75cm area with dry debris, slightly yellow in color. No drainage, induration, fluctuance, tenderness with palpation.  Psychiatric: She has a normal mood and affect. Her behavior is normal. Judgment and thought content normal     Assessment/Plan:  1. Functional deficits secondary to left BKA 01/29/2015  -she is a K3 ambulator.    2. Pain Management: No pain at this point  3. Wound-keep wound dry-let tissue underneath further heal, avoid abrasion to outer portion  -does not need bandaid at this point  -may wear prosthesis and shrinker but no standing or walking with prosthesis until wound healed  5. Neuropsych: This patient is capable of making decisions on her own behalf.   Follow up with me PRN. 15 minutes spent with patient today

## 2015-04-09 DIAGNOSIS — I10 Essential (primary) hypertension: Secondary | ICD-10-CM | POA: Diagnosis not present

## 2015-04-09 DIAGNOSIS — Z89512 Acquired absence of left leg below knee: Secondary | ICD-10-CM | POA: Diagnosis not present

## 2015-04-09 DIAGNOSIS — I739 Peripheral vascular disease, unspecified: Secondary | ICD-10-CM | POA: Diagnosis not present

## 2015-04-09 DIAGNOSIS — Z8542 Personal history of malignant neoplasm of other parts of uterus: Secondary | ICD-10-CM | POA: Diagnosis not present

## 2015-04-09 DIAGNOSIS — Z4781 Encounter for orthopedic aftercare following surgical amputation: Secondary | ICD-10-CM | POA: Diagnosis not present

## 2015-04-09 DIAGNOSIS — M1990 Unspecified osteoarthritis, unspecified site: Secondary | ICD-10-CM | POA: Diagnosis not present

## 2015-04-09 NOTE — Telephone Encounter (Signed)
[name] RN with [name] would like to recert patient.  She has a tiny spot to left of stump that has not healed.  Please call her at [phone].

## 2015-04-10 DIAGNOSIS — Z4781 Encounter for orthopedic aftercare following surgical amputation: Secondary | ICD-10-CM | POA: Diagnosis not present

## 2015-04-10 DIAGNOSIS — Z89512 Acquired absence of left leg below knee: Secondary | ICD-10-CM | POA: Diagnosis not present

## 2015-04-10 DIAGNOSIS — M1991 Primary osteoarthritis, unspecified site: Secondary | ICD-10-CM | POA: Diagnosis not present

## 2015-04-10 DIAGNOSIS — Z8542 Personal history of malignant neoplasm of other parts of uterus: Secondary | ICD-10-CM | POA: Diagnosis not present

## 2015-04-10 DIAGNOSIS — Z7902 Long term (current) use of antithrombotics/antiplatelets: Secondary | ICD-10-CM | POA: Diagnosis not present

## 2015-04-10 DIAGNOSIS — I10 Essential (primary) hypertension: Secondary | ICD-10-CM | POA: Diagnosis not present

## 2015-04-10 DIAGNOSIS — I739 Peripheral vascular disease, unspecified: Secondary | ICD-10-CM | POA: Diagnosis not present

## 2015-04-10 NOTE — Telephone Encounter (Signed)
Left message with [name] to return call.

## 2015-04-10 NOTE — Telephone Encounter (Signed)
Called [name] back, she asked for 2 more weeks to check wound that is still closing up. Verbal order given

## 2015-04-17 DIAGNOSIS — M1991 Primary osteoarthritis, unspecified site: Secondary | ICD-10-CM | POA: Diagnosis not present

## 2015-04-17 DIAGNOSIS — Z7902 Long term (current) use of antithrombotics/antiplatelets: Secondary | ICD-10-CM | POA: Diagnosis not present

## 2015-04-17 DIAGNOSIS — Z89512 Acquired absence of left leg below knee: Secondary | ICD-10-CM | POA: Diagnosis not present

## 2015-04-17 DIAGNOSIS — I739 Peripheral vascular disease, unspecified: Secondary | ICD-10-CM | POA: Diagnosis not present

## 2015-04-17 DIAGNOSIS — Z4781 Encounter for orthopedic aftercare following surgical amputation: Secondary | ICD-10-CM | POA: Diagnosis not present

## 2015-04-17 DIAGNOSIS — I10 Essential (primary) hypertension: Secondary | ICD-10-CM | POA: Diagnosis not present

## 2015-04-19 ENCOUNTER — Telehealth: Payer: Self-pay | Admitting: Physical Medicine & Rehabilitation

## 2015-04-19 DIAGNOSIS — Z89512 Acquired absence of left leg below knee: Secondary | ICD-10-CM

## 2015-04-19 NOTE — Telephone Encounter (Signed)
[facility] requesting patient get prostetic/gait training for new device

## 2015-04-20 NOTE — Telephone Encounter (Signed)
done

## 2015-04-24 DIAGNOSIS — Z7902 Long term (current) use of antithrombotics/antiplatelets: Secondary | ICD-10-CM | POA: Diagnosis not present

## 2015-04-24 DIAGNOSIS — I739 Peripheral vascular disease, unspecified: Secondary | ICD-10-CM | POA: Diagnosis not present

## 2015-04-24 DIAGNOSIS — Z89512 Acquired absence of left leg below knee: Secondary | ICD-10-CM | POA: Diagnosis not present

## 2015-04-24 DIAGNOSIS — Z4781 Encounter for orthopedic aftercare following surgical amputation: Secondary | ICD-10-CM | POA: Diagnosis not present

## 2015-04-24 DIAGNOSIS — I10 Essential (primary) hypertension: Secondary | ICD-10-CM | POA: Diagnosis not present

## 2015-04-24 DIAGNOSIS — M1991 Primary osteoarthritis, unspecified site: Secondary | ICD-10-CM | POA: Diagnosis not present

## 2015-05-01 DIAGNOSIS — Z4781 Encounter for orthopedic aftercare following surgical amputation: Secondary | ICD-10-CM | POA: Diagnosis not present

## 2015-05-01 DIAGNOSIS — Z7902 Long term (current) use of antithrombotics/antiplatelets: Secondary | ICD-10-CM | POA: Diagnosis not present

## 2015-05-01 DIAGNOSIS — M1991 Primary osteoarthritis, unspecified site: Secondary | ICD-10-CM | POA: Diagnosis not present

## 2015-05-01 DIAGNOSIS — I739 Peripheral vascular disease, unspecified: Secondary | ICD-10-CM | POA: Diagnosis not present

## 2015-05-01 DIAGNOSIS — Z89512 Acquired absence of left leg below knee: Secondary | ICD-10-CM | POA: Diagnosis not present

## 2015-05-01 DIAGNOSIS — I10 Essential (primary) hypertension: Secondary | ICD-10-CM | POA: Diagnosis not present

## 2015-05-08 DIAGNOSIS — I10 Essential (primary) hypertension: Secondary | ICD-10-CM | POA: Diagnosis not present

## 2015-05-08 DIAGNOSIS — Z4781 Encounter for orthopedic aftercare following surgical amputation: Secondary | ICD-10-CM | POA: Diagnosis not present

## 2015-05-08 DIAGNOSIS — M1991 Primary osteoarthritis, unspecified site: Secondary | ICD-10-CM | POA: Diagnosis not present

## 2015-05-08 DIAGNOSIS — I739 Peripheral vascular disease, unspecified: Secondary | ICD-10-CM | POA: Diagnosis not present

## 2015-05-08 DIAGNOSIS — Z7902 Long term (current) use of antithrombotics/antiplatelets: Secondary | ICD-10-CM | POA: Diagnosis not present

## 2015-05-08 DIAGNOSIS — Z89512 Acquired absence of left leg below knee: Secondary | ICD-10-CM | POA: Diagnosis not present

## 2015-05-15 DIAGNOSIS — M1991 Primary osteoarthritis, unspecified site: Secondary | ICD-10-CM | POA: Diagnosis not present

## 2015-05-15 DIAGNOSIS — I739 Peripheral vascular disease, unspecified: Secondary | ICD-10-CM | POA: Diagnosis not present

## 2015-05-15 DIAGNOSIS — Z7902 Long term (current) use of antithrombotics/antiplatelets: Secondary | ICD-10-CM | POA: Diagnosis not present

## 2015-05-15 DIAGNOSIS — I10 Essential (primary) hypertension: Secondary | ICD-10-CM | POA: Diagnosis not present

## 2015-05-15 DIAGNOSIS — Z89512 Acquired absence of left leg below knee: Secondary | ICD-10-CM | POA: Diagnosis not present

## 2015-05-15 DIAGNOSIS — Z4781 Encounter for orthopedic aftercare following surgical amputation: Secondary | ICD-10-CM | POA: Diagnosis not present

## 2015-05-16 ENCOUNTER — Encounter: Payer: Self-pay | Admitting: Physical Therapy

## 2015-05-16 ENCOUNTER — Ambulatory Visit: Payer: Medicare Other | Attending: Physical Medicine & Rehabilitation | Admitting: Physical Therapy

## 2015-05-16 DIAGNOSIS — Z89512 Acquired absence of left leg below knee: Secondary | ICD-10-CM | POA: Diagnosis not present

## 2015-05-16 DIAGNOSIS — Z7409 Other reduced mobility: Secondary | ICD-10-CM | POA: Diagnosis not present

## 2015-05-16 DIAGNOSIS — R2689 Other abnormalities of gait and mobility: Secondary | ICD-10-CM

## 2015-05-16 DIAGNOSIS — R531 Weakness: Secondary | ICD-10-CM | POA: Diagnosis not present

## 2015-05-16 DIAGNOSIS — R29818 Other symptoms and signs involving the nervous system: Secondary | ICD-10-CM | POA: Diagnosis not present

## 2015-05-16 DIAGNOSIS — M24669 Ankylosis, unspecified knee: Secondary | ICD-10-CM | POA: Diagnosis not present

## 2015-05-16 DIAGNOSIS — R269 Unspecified abnormalities of gait and mobility: Secondary | ICD-10-CM | POA: Diagnosis not present

## 2015-05-16 DIAGNOSIS — R2681 Unsteadiness on feet: Secondary | ICD-10-CM | POA: Diagnosis not present

## 2015-05-16 DIAGNOSIS — Z5189 Encounter for other specified aftercare: Secondary | ICD-10-CM | POA: Insufficient documentation

## 2015-05-16 DIAGNOSIS — Z4789 Encounter for other orthopedic aftercare: Secondary | ICD-10-CM

## 2015-05-17 NOTE — Therapy (Signed)
[facility] [address] [place], [name], [address] Phone: [phone]   Fax:  [phone]  Physical Therapy Evaluation  Patient Details  Name: [name] MRN: [id number] Date of Birth: [birth year] Referring Provider: Alger Simons, MD  Encounter Date: 05/16/2015      PT End of Session - 05/16/15 0845    Visit Number 1   Number of Visits 17   Date for PT Re-Evaluation 07/13/15   Authorization Type Medicare G-Code & progress report every 10 visits   PT Start Time 0845   PT Stop Time 0928   PT Time Calculation (min) 43 min   Equipment Utilized During Treatment Gait belt   Activity Tolerance Patient tolerated treatment well   Behavior During Therapy [facility] for tasks assessed/performed      Past Medical History  Diagnosis Date  . Hypertension   . PAD (peripheral artery disease) ([place])   . Arthritis   . Cancer ([place])     utertrine    Past Surgical History  Procedure Laterality Date  . Spine surgery    . Knee surgery Right 1984  . Peripheral vascular catheterization N/A 09/15/2014    Procedure: Abdominal Aortogram;  Surgeon: Elam Dutch, MD;  Location: [facility] INVASIVE CV LAB CUPID;  Service: Cardiovascular;  Laterality: N/A;  . Peripheral vascular catheterization Bilateral 09/15/2014    Procedure: Lower Extremity Angiography;  Surgeon: Elam Dutch, MD;  Location: [place];  Service: Cardiovascular;  Laterality: Bilateral;  . Abdominal hysterectomy      partial  . Amputation Left 01/29/2015    Procedure: Left  AMPUTATION BELOW KNEE;  Surgeon: Elam Dutch, MD;  Location: [place];  Service: Vascular;  Laterality: Left;    There were no vitals filed for this visit.  Visit Diagnosis:  Status post below knee amputation of left lower extremity ([place])  Encounter for prosthetic gait training  Weakness generalized  Decreased range of knee movement, unspecified laterality  Impaired  functional mobility and activity tolerance  Abnormality of gait  Balance problems  Unsteadiness on feet      Subjective Assessment - 05/16/15 0850    Subjective This 82yo female developed a vascular wound on her left foot that required a Transtibial Amputation on 01/29/2015. She recieved her first prosthesis 04/20/2015 and is dependent in use & care. She presents for PT evaluation.    Patient is accompained by: Family member   Patient Stated Goals She wants to use prosthesis to resume active lifestyle (clean house including heavy duty) go outside to [employer] garden, yard work.    Currently in Pain? No/denies            [facility] PT Assessment - 05/16/15 0845    Assessment   Medical Diagnosis Left Transtibial Amputation prosthesis   Referring Provider Alger Simons, MD   Onset Date/Surgical Date 04/20/15  prosthesis delivery   Precautions   Precautions Fall   Restrictions   Weight Bearing Restrictions No   Balance Screen   Has the patient fallen in the past 6 months No   Has the patient had a decrease in activity level because of a fear of falling?  No   Is the patient reluctant to leave their home because of a fear of falling?  No   Home Environment   Living Environment Private residence   Living Arrangements Alone   Available Help at Discharge Family   Type of [place] entrance  Home Layout Two level;Able to live on main level with bedroom/bathroom;Full bath on main level  she does not go upstairs   Alternate Level Stairs-Number of Steps 14   Alternate Level Stairs-Rails None   Prior Function   Level of Independence Independent;Independent with household mobility without device;Independent with community mobility without device;Independent with gait   Observation/Other Assessments   Focus on Therapeutic Outcomes (FOTO)  47 Functional Status   Fear Avoidance Belief Questionnaire (FABQ)  33 (10)   ROM / Strength   AROM / PROM / Strength PROM;Strength    PROM   PROM Assessment Site Knee   Right/Left Knee Right;Left   Right Knee Extension -14   Left Knee Extension -9   Strength   Overall Strength Deficits   Overall Strength Comments Bilateral hips grossly 4/5   Transfers   Transfers Sit to Stand;Stand to Sit   Sit to Stand 5: Supervision;With upper extremity assist;With armrests;From chair/3-in-1  needs RW to stabilize upon arising   Stand to Sit 5: Supervision;With upper extremity assist;With armrests;To chair/3-in-1  needs RW for stabilization to prepare   Ambulation/Gait   Ambulation/Gait Yes   Ambulation/Gait Assistance 3: Mod assist;5: Supervision  ModA cane & SBA with RW (gait deviations)   Ambulation Distance (Feet) 100 Feet   Assistive device Prosthesis;Rolling walker;Straight cane   Gait Pattern Step-through pattern;Decreased arm swing - left;Decreased step length - right;Decreased stance time - left;Decreased stride length;Decreased hip/knee flexion - left;Decreased weight shift to left;Right flexed knee in stance;Left flexed knee in stance;Antalgic;Abducted - left;Trunk flexed;Poor foot clearance - left   Ambulation Surface Indoor;Level   Gait velocity 1.61 ft/sec  <1.8 ft/sec indicates fall risk   Ramp 5: Supervision  Prosthesis & RW (BUE support required)   Ramp Details (indicate cue type and reason) cues on sequence with prosthesis   Curb 5: Supervision  Prosthesis & RW (BUE support required)   Curb Details (indicate cue type and reason) cues on prosthesis control   Standardized Balance Assessment   Standardized Balance Assessment Berg Balance Test;Timed Up and Go Test   Berg Balance Test   Sit to Stand Needs minimal aid to stand or to stabilize   Standing Unsupported Able to stand 2 minutes with supervision   Sitting with Back Unsupported but Feet Supported on Floor or Stool Able to sit safely and securely 2 minutes   Stand to Sit Controls descent by using hands   Transfers Able to transfer safely, definite need  of hands   Standing Unsupported with Eyes Closed Able to stand 10 seconds with supervision   Standing Ubsupported with Feet Together Able to place feet together independently and stand for 1 minute with supervision   From Standing, Reach Forward with Outstretched Arm Reaches forward but needs supervision   From Standing Position, Pick up Object from Floor Able to pick up shoe, needs supervision   From Standing Position, Turn to Look Behind Over each Shoulder Needs supervision when turning   Turn 360 Degrees Needs assistance while turning   Standing Unsupported, Alternately Place Feet on Step/Stool Needs assistance to keep from falling or unable to try   Standing Unsupported, One Foot in [employer] balance while stepping or standing   Standing on One Leg Tries to lift leg/unable to hold 3 seconds but remains standing independently   Total Score 26   Timed Up and Go Test   Normal TUG (seconds) 26.12  26.12sec RW, 22.96sec cane modA. <13.5sec fall risk  Prosthetics Assessment - 05/16/15 0845    Prosthetics   Prosthetic Care Dependent with Skin check;Residual limb care;Prosthetic cleaning;Correct ply sock adjustment;Proper wear schedule/adjustment;Proper weight-bearing schedule/adjustment   Donning prosthesis  Supervision  requires skilled cues   Doffing prosthesis  Modified independent (Device/Increase time)   Current prosthetic wear tolerance (days/week)  daily   Current prosthetic wear tolerance (#hours/day)  built up to 4hrs 2x/day   Current prosthetic weight-bearing tolerance (hours/day)  5 minutes standing & gait activities with no pain or discomfort but partial weight on prosthesis   Edema entire limb non-pitting edema, distal tibia area with pitting edema    Residual limb condition  lateral incision with 90mm superficial wound no signs of infection, normal moisture, skin color & temperature, no hair growth,    K code/activity level with prosthetic use  3 full community with  variable cadence                  [facility] Adult PT Treatment/Exercise - 05/16/15 0845    Prosthetics   Education Provided Skin check;Residual limb care;Prosthetic cleaning;Ply sock cleaning;Correct ply sock adjustment;Proper Donning;Proper wear schedule/adjustment;Proper weight-bearing schedule/adjustment   Person(s) Educated Patient  grandson   Education Method Explanation;Demonstration;Tactile cues;Verbal cues   Education Method Verbalized understanding;Returned demonstration;Tactile cues required;Verbal cues required;Needs further instruction                  PT Short Term Goals - 05/16/15 0930    PT SHORT TERM GOAL #1   Title Patient demonstrates understanding of prosthetic care with minimal cues required. (Target Date 06/15/2015)   Time 4   Period Weeks   Status New   PT SHORT TERM GOAL #2   Title Patient tolerates wear of prosthesis >12hrs total /day with no negative changes to wound & no tenderness on limb.  (Target Date 06/15/2015)   Time 4   Period Weeks   Status New   PT SHORT TERM GOAL #3   Title Patient reaches 10", rotates shoulders to look behind her and picks up objects from floor without UE support with supervision.  (Target Date 06/15/2015)   Time 4   Period Weeks   Status New   PT SHORT TERM GOAL #4   Title Patient ambulates 200' with single point cane & prosthesis with supervision.  (Target Date 06/15/2015)   Time 4   Period Weeks   Status New   PT SHORT TERM GOAL #5   Title Patient negotiates ramps, curbs & stairs (1 rail) with single point cane & prosthesis with minimal assist.  (Target Date 06/15/2015)   Time 4   Period Weeks   Status New           PT Long Term Goals - 05/16/15 0930    PT LONG TERM GOAL #1   Title Patient demonstrates / verbalizes safe, independent prosthetic care to enable prosthesis use without issues.  (Target Date 06/15/2015)   Time 4   Period Weeks   Status New   PT LONG TERM GOAL #2   Title Patient tolerates wear  of prosthesis >90% of awake hours without skin issues or limb tenderness to enable function throughout her day.  (Target Date 06/15/2015)   Time 4   Period Weeks   Status New   PT LONG TERM GOAL #3   Title Berg Balance >45/56 with prosthesis to reduce rall risk.  (Target Date 06/15/2015)   Time 4   Period Weeks   Status New   PT LONG TERM GOAL #  4   Title Timed Up & Go with prosthesis only <13.5 sec to indicate lower fall risk.  (Target Date 06/15/2015)   Time 4   Period Weeks   Status New   PT LONG TERM GOAL #5   Title Patient ambulates 100' around furniture carrying laundry basket with prosthesis only modified indepent.  (Target Date 06/15/2015)   Time 4   Period Weeks   Status New   Additional Long Term Goals   Additional Long Term Goals Yes   PT LONG TERM GOAL #6   Title Patient amulates 400' with single point cane outside surfaces including grass, ramps & curbs with prosthesis modified independent to enable community mobility.  (Target Date 06/15/2015)   Time 4   Period Weeks   Status New               Plan - 05/17/15 0838    Clinical Impression Statement This 82yo female lives alone and was very active prior to amputation. She underwent a left Transtibial Amputation on 01/29/2015 due to wounds from vascular conditions. She has co-morbidities of HTN, PAD, arthritis that will impact her functional progress. She recieved her first prosthesis on 04/20/2015 and is dependent in safe care & use with limited wear limiting function during the day. Berg Balance 26/56 and Timed Up-Go 26.12 sec with RW both indicate high fall risk. Her gait is dependent with prosthesis tolerating partial weight bearing. With use of cane, she requires moderate assist and rolling walker supervision with cues for deviaitions. She is dependent on barriers of ramps & curbs. Her condition is evolving and requires moderate complexity to develop plan of care.    Pt will benefit from skilled therapeutic intervention in  order to improve on the following deficits Abnormal gait;Decreased activity tolerance;Decreased balance;Decreased endurance;Decreased skin integrity;Decreased range of motion;Decreased mobility;Decreased strength;Increased edema;Postural dysfunction;Prosthetic Dependency   Rehab Potential Good   PT Frequency 2x / week   PT Duration 8 weeks   PT Treatment/Interventions ADLs/Self Care Home Management;DME Instruction;Gait training;Stair training;Functional mobility training;Therapeutic activities;Therapeutic exercise;Balance training;Neuromuscular re-education;Patient/family education;Prosthetic Training   PT Next Visit Plan review prosthetic care, HEP midline at sink, gait training on barriers   Consulted and Agree with Plan of Care Patient;Family member/caregiver   Family Member Consulted grandson (adult)          G-Codes - 2015/06/14 0930    Functional Assessment Tool Used Patient tolerates wear 4hrs 2x/day. She has wound on residual limb. She is dependent in prosthetic care / use.    Functional Limitation Self care   Self Care Current Status [phone]) At least 60 percent but less than 80 percent impaired, limited or restricted   Self Care Goal Status [id number]) At least 1 percent but less than 20 percent impaired, limited or restricted       Problem List Patient Active Problem List   Diagnosis Date Noted  . Status post below knee amputation of left lower extremity ([place]) 01/31/2015  . Non-healing wound of lower extremity 01/29/2015  . Ulcer of toe of left foot ([place]) 12/27/2014  . Essential hypertension 08/22/2014    Jamey Reas PT, DPT 05/17/2015, 9:06 AM  [facility] [address] [place], [name], [address] Phone: [phone]   Fax:  [phone]  Name: [name] MRN: [id number] Date of Birth: August 08, 1931

## 2015-05-22 ENCOUNTER — Other Ambulatory Visit: Payer: Self-pay | Admitting: Physical Medicine & Rehabilitation

## 2015-05-23 ENCOUNTER — Encounter: Payer: Self-pay | Admitting: Physical Therapy

## 2015-05-23 ENCOUNTER — Ambulatory Visit: Payer: Medicare Other | Admitting: Physical Therapy

## 2015-05-23 DIAGNOSIS — Z5189 Encounter for other specified aftercare: Secondary | ICD-10-CM | POA: Diagnosis not present

## 2015-05-23 DIAGNOSIS — R269 Unspecified abnormalities of gait and mobility: Secondary | ICD-10-CM | POA: Diagnosis not present

## 2015-05-23 DIAGNOSIS — R2689 Other abnormalities of gait and mobility: Secondary | ICD-10-CM

## 2015-05-23 DIAGNOSIS — M24669 Ankylosis, unspecified knee: Secondary | ICD-10-CM | POA: Diagnosis not present

## 2015-05-23 DIAGNOSIS — Z89512 Acquired absence of left leg below knee: Secondary | ICD-10-CM

## 2015-05-23 DIAGNOSIS — R531 Weakness: Secondary | ICD-10-CM

## 2015-05-23 DIAGNOSIS — R2681 Unsteadiness on feet: Secondary | ICD-10-CM

## 2015-05-23 DIAGNOSIS — Z4789 Encounter for other orthopedic aftercare: Secondary | ICD-10-CM

## 2015-05-23 DIAGNOSIS — Z7409 Other reduced mobility: Secondary | ICD-10-CM | POA: Diagnosis not present

## 2015-05-24 NOTE — Therapy (Signed)
[facility] [address] [place], [name], [address] Phone: [phone]   Fax:  [phone]  Physical Therapy Treatment  Patient Details  Name: [name] MRN: [id number] Date of Birth: February 15, 1932 Referring Provider: Alger Simons, MD  Encounter Date: 05/23/2015      PT End of Session - 05/23/15 0930    Visit Number 2   Number of Visits 17   Date for PT Re-Evaluation 07/13/15   Authorization Type Medicare G-Code & progress report every 10 visits   PT Start Time [id number]   PT Stop Time 0930   PT Time Calculation (min) 38 min   Equipment Utilized During Treatment Gait belt   Activity Tolerance Patient tolerated treatment well   Behavior During Therapy [facility] for tasks assessed/performed      Past Medical History  Diagnosis Date  . Hypertension   . PAD (peripheral artery disease) ([place])   . Arthritis   . Cancer ([place])     utertrine    Past Surgical History  Procedure Laterality Date  . Spine surgery    . Knee surgery Right 1984  . Peripheral vascular catheterization N/A 09/15/2014    Procedure: Abdominal Aortogram;  Surgeon: Elam Dutch, MD;  Location: [facility] INVASIVE CV LAB CUPID;  Service: Cardiovascular;  Laterality: N/A;  . Peripheral vascular catheterization Bilateral 09/15/2014    Procedure: Lower Extremity Angiography;  Surgeon: Elam Dutch, MD;  Location: [place];  Service: Cardiovascular;  Laterality: Bilateral;  . Abdominal hysterectomy      partial  . Amputation Left 01/29/2015    Procedure: Left  AMPUTATION BELOW KNEE;  Surgeon: Elam Dutch, MD;  Location: [place];  Service: Vascular;  Laterality: Left;    There were no vitals filed for this visit.  Visit Diagnosis:  Status post below knee amputation of left lower extremity ([name])  Encounter for prosthetic gait training  Weakness generalized  Decreased range of knee movement, unspecified laterality  Abnormality of  gait  Impaired functional mobility and activity tolerance  Balance problems  Unsteadiness on feet      Subjective Assessment - 05/23/15 0854    Subjective No falls. She wearing prosthesis 8 hrs with off only 30 minutes. She reports liner wear all awake hours.    Currently in Pain? No/denies                         [facility] Adult PT Treatment/Exercise - 05/23/15 0845    Transfers   Transfers Sit to Stand;Stand to Sit   Sit to Stand 5: Supervision;With upper extremity assist;From chair/3-in-1   Sit to Stand Details (indicate cue type and reason) verbal cues to engage prosthesis   Stand to Sit 5: Supervision;With upper extremity assist;To chair/3-in-1   Ambulation/Gait   Ambulation/Gait Yes   Ambulation/Gait Assistance 5: Supervision   Ambulation/Gait Assistance Details demo & verbal cues on proper step width to decrease abduction of prosthesis and adduction /external rotation of RLE   Ambulation Distance (Feet) 120 Feet  120' X 2, 40' X 2   Assistive device Prosthesis;Rolling walker   Gait Pattern Step-through pattern;Decreased arm swing - left;Decreased step length - right;Decreased stance time - left;Decreased stride length;Decreased hip/knee flexion - left;Decreased weight shift to left;Right flexed knee in stance;Left flexed knee in stance;Antalgic;Abducted - left;Trunk flexed;Poor foot clearance - left   Ambulation Surface Indoor;Level   Prosthetics   Prosthetic Care Comments  PT instructed increasing wear too  rapidly can cause skin integrity due to excess moisture / sweat. PT adjusted wear schedule. PT instructed in adjusting ply socks with demo, having pt ambulate 68' with too few and too many followed by 120' with correct ply.    Current prosthetic wear tolerance (days/week)  daily   Current prosthetic wear tolerance (#hours/day)  Pt wearing 8 hrs with only 30 min break in middle. PT instructed to wear 5hrs 2x/day with drying limb/liner half way.    Edema entire  limb non-pitting edema, distal tibia area with pitting edema    Residual limb condition  small spot appears to be suture working way out & lateral incision with 45mm superficial wound no signs of infection, normal moisture, skin color & temperature, no hair growth,    Education Provided Correct ply sock adjustment;Residual limb care;Proper Donning;Proper wear schedule/adjustment   Person(s) Educated Patient  grandson   Education Method Explanation;Demonstration;Tactile cues;Verbal cues   Education Method Verbalized understanding;Tactile cues required;Verbal cues required;Needs further instruction   Donning Prosthesis Supervision                  PT Short Term Goals - 05/16/15 0930    PT SHORT TERM GOAL #1   Title Patient demonstrates understanding of prosthetic care with minimal cues required. (Target Date 06/15/2015)   Time 4   Period Weeks   Status New   PT SHORT TERM GOAL #2   Title Patient tolerates wear of prosthesis >12hrs total /day with no negative changes to wound & no tenderness on limb.  (Target Date 06/15/2015)   Time 4   Period Weeks   Status New   PT SHORT TERM GOAL #3   Title Patient reaches 10", rotates shoulders to look behind her and picks up objects from floor without UE support with supervision.  (Target Date 06/15/2015)   Time 4   Period Weeks   Status New   PT SHORT TERM GOAL #4   Title Patient ambulates 200' with single point cane & prosthesis with supervision.  (Target Date 06/15/2015)   Time 4   Period Weeks   Status New   PT SHORT TERM GOAL #5   Title Patient negotiates ramps, curbs & stairs (1 rail) with single point cane & prosthesis with minimal assist.  (Target Date 06/15/2015)   Time 4   Period Weeks   Status New           PT Long Term Goals - 05/16/15 0930    PT LONG TERM GOAL #1   Title Patient demonstrates / verbalizes safe, independent prosthetic care to enable prosthesis use without issues.  (Target Date 06/15/2015)   Time 4   Period  Weeks   Status New   PT LONG TERM GOAL #2   Title Patient tolerates wear of prosthesis >90% of awake hours without skin issues or limb tenderness to enable function throughout her day.  (Target Date 06/15/2015)   Time 4   Period Weeks   Status New   PT LONG TERM GOAL #3   Title Berg Balance >45/56 with prosthesis to reduce rall risk.  (Target Date 06/15/2015)   Time 4   Period Weeks   Status New   PT LONG TERM GOAL #4   Title Timed Up & Go with prosthesis only <13.5 sec to indicate lower fall risk.  (Target Date 06/15/2015)   Time 4   Period Weeks   Status New   PT LONG TERM GOAL #5   Title Patient ambulates 100'  around furniture carrying laundry basket with prosthesis only modified indepent.  (Target Date 06/15/2015)   Time 4   Period Weeks   Status New   Additional Long Term Goals   Additional Long Term Goals Yes   PT LONG TERM GOAL #6   Title Patient amulates 400' with single point cane outside surfaces including grass, ramps & curbs with prosthesis modified independent to enable community mobility.  (Target Date 06/15/2015)   Time 4   Period Weeks   Status New               Plan - 05/23/15 0930    Clinical Impression Statement Pt and grandson appear to have a better understanding of proper adjustment of wear schedule and ply socks. Patient improved weight acceptance on prosthesis with decrease in abduction.    Pt will benefit from skilled therapeutic intervention in order to improve on the following deficits Abnormal gait;Decreased activity tolerance;Decreased balance;Decreased endurance;Decreased skin integrity;Decreased range of motion;Decreased mobility;Decreased strength;Increased edema;Postural dysfunction;Prosthetic Dependency   Rehab Potential Good   PT Frequency 2x / week   PT Duration 8 weeks   PT Treatment/Interventions ADLs/Self Care Home Management;DME Instruction;Gait training;Stair training;Functional mobility training;Therapeutic activities;Therapeutic  exercise;Balance training;Neuromuscular re-education;Patient/family education;Prosthetic Training   PT Next Visit Plan review prosthetic care, HEP midline at sink, gait training on barriers   Consulted and Agree with Plan of Care Patient;Family member/caregiver   Family Member Consulted grandson (adult)        Problem List Patient Active Problem List   Diagnosis Date Noted  . Status post below knee amputation of left lower extremity ([place]) 01/31/2015  . Non-healing wound of lower extremity 01/29/2015  . Ulcer of toe of left foot ([place]) 12/27/2014  . Essential hypertension 08/22/2014    Jamey Reas PT, DPT 05/24/2015, 5:56 AM  [facility] [address] [place], [name], [address] Phone: [phone]   Fax:  [phone]  Name: [name] MRN: [id number] Date of Birth: 05/07/1932

## 2015-05-25 ENCOUNTER — Ambulatory Visit: Payer: Medicare Other | Admitting: Physical Therapy

## 2015-05-25 ENCOUNTER — Encounter: Payer: Self-pay | Admitting: Physical Therapy

## 2015-05-25 DIAGNOSIS — R2681 Unsteadiness on feet: Secondary | ICD-10-CM

## 2015-05-25 DIAGNOSIS — M24669 Ankylosis, unspecified knee: Secondary | ICD-10-CM | POA: Diagnosis not present

## 2015-05-25 DIAGNOSIS — Z4789 Encounter for other orthopedic aftercare: Secondary | ICD-10-CM

## 2015-05-25 DIAGNOSIS — R531 Weakness: Secondary | ICD-10-CM

## 2015-05-25 DIAGNOSIS — Z7409 Other reduced mobility: Secondary | ICD-10-CM

## 2015-05-25 DIAGNOSIS — Z5189 Encounter for other specified aftercare: Secondary | ICD-10-CM | POA: Diagnosis not present

## 2015-05-25 DIAGNOSIS — R2689 Other abnormalities of gait and mobility: Secondary | ICD-10-CM

## 2015-05-25 DIAGNOSIS — Z89512 Acquired absence of left leg below knee: Secondary | ICD-10-CM | POA: Diagnosis not present

## 2015-05-25 DIAGNOSIS — R269 Unspecified abnormalities of gait and mobility: Secondary | ICD-10-CM | POA: Diagnosis not present

## 2015-05-29 ENCOUNTER — Other Ambulatory Visit: Payer: Self-pay | Admitting: Physical Medicine & Rehabilitation

## 2015-05-29 NOTE — Therapy (Signed)
[facility] [address] [place], [name], [address] Phone: [phone]   Fax:  [phone]  Physical Therapy Treatment  Patient Details  Name: [name] MRN: [id number] Date of Birth: 1932-03-20 Referring Provider: Alger Simons, MD  Encounter Date: 05/25/2015     05/25/15 0936  PT Visits / Re-Eval  Visit Number 3  Number of Visits 17  Date for PT Re-Evaluation 07/13/15  Authorization  Authorization Type Medicare G-Code & progress report every 10 visits  PT Time Calculation  PT Start Time 0934  PT Stop Time 1015  PT Time Calculation (min) 41 min  PT - End of Session  Equipment Utilized During Treatment Gait belt  Activity Tolerance Patient tolerated treatment well  Behavior During Therapy [facility] for tasks assessed/performed    Past Medical History  Diagnosis Date  . Hypertension   . PAD (peripheral artery disease) ([place])   . Arthritis   . Cancer ([place])     utertrine    Past Surgical History  Procedure Laterality Date  . Spine surgery    . Knee surgery Right 1984  . Peripheral vascular catheterization N/A 09/15/2014    Procedure: Abdominal Aortogram;  Surgeon: Elam Dutch, MD;  Location: [facility] INVASIVE CV LAB CUPID;  Service: Cardiovascular;  Laterality: N/A;  . Peripheral vascular catheterization Bilateral 09/15/2014    Procedure: Lower Extremity Angiography;  Surgeon: Elam Dutch, MD;  Location: [place];  Service: Cardiovascular;  Laterality: Bilateral;  . Abdominal hysterectomy      partial  . Amputation Left 01/29/2015    Procedure: Left  AMPUTATION BELOW KNEE;  Surgeon: Elam Dutch, MD;  Location: [place];  Service: Vascular;  Laterality: Left;    There were no vitals filed for this visit.  Visit Diagnosis:  Abnormality of gait  Impaired functional mobility and activity tolerance  Weakness generalized  Encounter for prosthetic gait training  Unsteadiness on  feet  Balance problems     05/25/15 0935  Symptoms/Limitations  Subjective No falls or pain to report.   Patient is accompained by: Family member (grandson)  Patient Stated Goals She wants to use prosthesis to resume active lifestyle (clean house including heavy duty) go outside to [employer] garden, yard work.   Pain Assessment  Currently in Pain? No/denies      05/25/15 [id number]  Transfers  Sit to Stand 5: Supervision;With upper extremity assist;From bed  Stand to Sit 5: Supervision;With upper extremity assist;To bed  Ambulation/Gait  Ambulation/Gait Yes  Ambulation/Gait Assistance 5: Supervision  Ambulation/Gait Assistance Details cues on posture, step length and step width.  Ambulation Distance (Feet) 230 Feet (x1 RW, 115 x1 cane), plus several small distances  Assistive device Prosthesis;Rolling walker  Gait Pattern Step-through pattern;Decreased arm swing - left;Decreased step length - right;Decreased stance time - left;Decreased stride length;Decreased hip/knee flexion - left;Decreased weight shift to left;Right flexed knee in stance;Left flexed knee in stance;Antalgic;Abducted - left;Trunk flexed;Poor foot clearance - left  Ambulation Surface Level;Indoor  Stairs Yes  Stairs Assistance 4: Min guard  Stair Management Technique Alternating pattern;Two rails;Forwards;One rail Right;One rail Left;With cane  Number of Stairs 4 (x2 reps bil rails; 1 rep with each rail/cane combo)  Ramp 5: Supervision (RW and prosthesis)  Ramp Details (indicate cue type and reason) x 2 reps, cues on sequencing and posture  Curb 5: Supervision (RW and prothesis)  Curb Details (indicate cue type and reason) x 2 reps, cues on sequencing and posture  Prosthetics  Prosthetic Care Comments  Prosthetist present and made some adjustments to prosthesis for alignment. Pt/grandson going to clinic for foot adjustment after session today.                       Current prosthetic wear tolerance (days/week)  daily   Current prosthetic wear tolerance (#hours/day)  5 hours 2x day with drying midway during 5 hours, 1-2 hours break between  Residual limb condition  small spot appears to be suture working way out & lateral incision with 35mm superficial wound no signs of infection. no drainage. no pain or tenderness with palpation.                                   Education Provided Residual limb care;Correct ply sock adjustment;Proper Donning;Proper wear schedule/adjustment;Proper weight-bearing schedule/adjustment  Person(s) Educated Patient;Caregiver(s) (grandson)  Education Method Explanation;Demonstration;Verbal cues  Education Method Verbalized understanding;Verbal cues required  Donning Prosthesis 5  Doffing Prosthesis 5         PT Short Term Goals - 05/16/15 0930    PT SHORT TERM GOAL #1   Title Patient demonstrates understanding of prosthetic care with minimal cues required. (Target Date 06/15/2015)   Time 4   Period Weeks   Status New   PT SHORT TERM GOAL #2   Title Patient tolerates wear of prosthesis >12hrs total /day with no negative changes to wound & no tenderness on limb.  (Target Date 06/15/2015)   Time 4   Period Weeks   Status New   PT SHORT TERM GOAL #3   Title Patient reaches 10", rotates shoulders to look behind her and picks up objects from floor without UE support with supervision.  (Target Date 06/15/2015)   Time 4   Period Weeks   Status New   PT SHORT TERM GOAL #4   Title Patient ambulates 200' with single point cane & prosthesis with supervision.  (Target Date 06/15/2015)   Time 4   Period Weeks   Status New   PT SHORT TERM GOAL #5   Title Patient negotiates ramps, curbs & stairs (1 rail) with single point cane & prosthesis with minimal assist.  (Target Date 06/15/2015)   Time 4   Period Weeks   Status New           PT Long Term Goals - 05/16/15 0930    PT LONG TERM GOAL #1   Title Patient demonstrates / verbalizes safe, independent prosthetic care to enable  prosthesis use without issues.  (Target Date 06/15/2015)   Time 4   Period Weeks   Status New   PT LONG TERM GOAL #2   Title Patient tolerates wear of prosthesis >90% of awake hours without skin issues or limb tenderness to enable function throughout her day.  (Target Date 06/15/2015)   Time 4   Period Weeks   Status New   PT LONG TERM GOAL #3   Title Berg Balance >45/56 with prosthesis to reduce rall risk.  (Target Date 06/15/2015)   Time 4   Period Weeks   Status New   PT LONG TERM GOAL #4   Title Timed Up & Go with prosthesis only <13.5 sec to indicate lower fall risk.  (Target Date 06/15/2015)   Time 4   Period Weeks   Status New   PT LONG TERM GOAL #5   Title Patient ambulates 100' around furniture carrying laundry  basket with prosthesis only modified indepent.  (Target Date 06/15/2015)   Time 4   Period Weeks   Status New   Additional Long Term Goals   Additional Long Term Goals Yes   PT LONG TERM GOAL #6   Title Patient amulates 400' with single point cane outside surfaces including grass, ramps & curbs with prosthesis modified independent to enable community mobility.  (Target Date 06/15/2015)   Time 4   Period Weeks   Status New        05/25/15 [id number]  Plan  Clinical Impression Statement Pt reported that the prothesis felt better after adjustements were made. Pt also needing cues on sock ply adjustement due to prosthesis rotating around with gait into clinc. Pt and grandson verbailzed improved understanding on sock management. Pt making steady progress toward goals.                          Pt will benefit from skilled therapeutic intervention in order to improve on the following deficits Abnormal gait;Decreased activity tolerance;Decreased balance;Decreased endurance;Decreased skin integrity;Decreased range of motion;Decreased mobility;Decreased strength;Increased edema;Postural dysfunction;Prosthetic Dependency  Rehab Potential Good  PT Frequency 2x / week  PT Duration 8 weeks   PT Treatment/Interventions ADLs/Self Care Home Management;DME Instruction;Gait training;Stair training;Functional mobility training;Therapeutic activities;Therapeutic exercise;Balance training;Neuromuscular re-education;Patient/family education;Prosthetic Training  PT Next Visit Plan review prosthetic care, HEP midline at sink, gait training on barriers  Consulted and Agree with Plan of Care Patient;Family member/caregiver  Family Member Consulted grandson (adult)      Problem List Patient Active Problem List   Diagnosis Date Noted  . Status post below knee amputation of left lower extremity ([place]) 01/31/2015  . Non-healing wound of lower extremity 01/29/2015  . Ulcer of toe of left foot ([place]) 12/27/2014  . Essential hypertension 08/22/2014    Willow Ora 05/29/2015, 2:19 PM  Willow Ora, PTA, [facility] [address], [place], [place] [address] [phone] 05/29/2015, 2:19 PM   Name: TYLAYSIA HONG MRN: [id number] Date of Birth: 1932/04/12

## 2015-05-30 ENCOUNTER — Ambulatory Visit: Payer: Medicare Other | Admitting: Physical Therapy

## 2015-05-30 ENCOUNTER — Encounter: Payer: Self-pay | Admitting: Physical Therapy

## 2015-05-30 DIAGNOSIS — R269 Unspecified abnormalities of gait and mobility: Secondary | ICD-10-CM | POA: Diagnosis not present

## 2015-05-30 DIAGNOSIS — R2689 Other abnormalities of gait and mobility: Secondary | ICD-10-CM

## 2015-05-30 DIAGNOSIS — Z5189 Encounter for other specified aftercare: Secondary | ICD-10-CM | POA: Diagnosis not present

## 2015-05-30 DIAGNOSIS — Z4789 Encounter for other orthopedic aftercare: Secondary | ICD-10-CM

## 2015-05-30 DIAGNOSIS — R531 Weakness: Secondary | ICD-10-CM

## 2015-05-30 DIAGNOSIS — R2681 Unsteadiness on feet: Secondary | ICD-10-CM

## 2015-05-30 DIAGNOSIS — Z89512 Acquired absence of left leg below knee: Secondary | ICD-10-CM

## 2015-05-30 DIAGNOSIS — Z7409 Other reduced mobility: Secondary | ICD-10-CM

## 2015-05-30 DIAGNOSIS — M24669 Ankylosis, unspecified knee: Secondary | ICD-10-CM | POA: Diagnosis not present

## 2015-05-31 NOTE — Therapy (Signed)
[facility] [address] [place], [name], [address] Phone: [phone]   Fax:  [phone]  Physical Therapy Treatment  Patient Details  Name: [name] MRN: [id number] Date of Birth: 12/30/1931 Referring Provider: Alger Simons, MD  Encounter Date: 05/30/2015      PT End of Session - 05/30/15 0845    Visit Number 4   Number of Visits 17   Date for PT Re-Evaluation 07/13/15   Authorization Type Medicare G-Code & progress report every 10 visits   PT Start Time 0802   PT Stop Time 0845   PT Time Calculation (min) 43 min   Equipment Utilized During Treatment Gait belt   Activity Tolerance Patient tolerated treatment well   Behavior During Therapy [facility] for tasks assessed/performed      Past Medical History  Diagnosis Date  . Hypertension   . PAD (peripheral artery disease) ([place])   . Arthritis   . Cancer ([place])     utertrine    Past Surgical History  Procedure Laterality Date  . Spine surgery    . Knee surgery Right 1984  . Peripheral vascular catheterization N/A 09/15/2014    Procedure: Abdominal Aortogram;  Surgeon: Elam Dutch, MD;  Location: [facility] INVASIVE CV LAB CUPID;  Service: Cardiovascular;  Laterality: N/A;  . Peripheral vascular catheterization Bilateral 09/15/2014    Procedure: Lower Extremity Angiography;  Surgeon: Elam Dutch, MD;  Location: [place];  Service: Cardiovascular;  Laterality: Bilateral;  . Abdominal hysterectomy      partial  . Amputation Left 01/29/2015    Procedure: Left  AMPUTATION BELOW KNEE;  Surgeon: Elam Dutch, MD;  Location: [place];  Service: Vascular;  Laterality: Left;    There were no vitals filed for this visit.  Visit Diagnosis:  Abnormality of gait  Impaired functional mobility and activity tolerance  Weakness generalized  Encounter for prosthetic gait training  Unsteadiness on feet  Balance problems  Status post below knee  amputation of left lower extremity ([facility])      Subjective Assessment - 05/30/15 0804    Subjective No falls. She is wearing prosthesis 5 hrs 2x/day drying half way with some tenderness at lateral tibial condyle. Saw prosthetist for alignment changes.    Currently in Pain? No/denies                         [facility] Adult PT Treatment/Exercise - 05/30/15 0800    Transfers   Sit to Stand 5: Supervision;With upper extremity assist;From chair/3-in-1;With armrests  from chairs with & without armrests   Sit to Stand Details (indicate cue type and reason) verbal, tactile & demo cues on technique to stabilize without UE support / touch & technique from chairs without armrest   Stand to Sit 5: Supervision;With upper extremity assist;With armrests;To chair/3-in-1  chairs with & without armrests   Stand to Sit Details demo, verbal & tactile cues on technique to lower body weight over feet first then reach back   Ambulation/Gait   Ambulation/Gait Yes   Ambulation/Gait Assistance 4: Min assist   Ambulation/Gait Assistance Details verbal cues & demo on proper step length with initial contact with heel of prosthesis, posture & wt shift over prosthesis in stance; step length & pelvic orientaion in turns.   Ambulation Distance (Feet) 150 Feet  150' X 2 plus multiple short bouts   Assistive device Prosthesis;Straight cane   Gait Pattern Step-through pattern;Decreased arm  swing - left;Decreased step length - right;Decreased stance time - left;Decreased stride length;Decreased hip/knee flexion - left;Decreased weight shift to left;Right flexed knee in stance;Left flexed knee in stance;Antalgic;Abducted - left;Trunk flexed;Poor foot clearance - left   Ambulation Surface Indoor;Level   Stairs Yes   Stairs Assistance 5: Supervision   Stairs Assistance Details (indicate cue type and reason) cues on clearance of prosthesis ascending & sequence   Stair Management Technique One rail Right;One rail  Left;Step to pattern;Forwards;With cane  alternated location of rail with cane/rail combo   Number of Stairs 4  3 reps (1 if 2 wide rails, 1 w/Lt rail, 1 w/Rt rail)   Ramp 4: Min assist  SPC and prosthesis   Ramp Details (indicate cue type and reason) 3 reps cues on posture, wt shift/distribution on prosthetic foot and step length   Curb 4: Min assist  SPC and prothesis   Curb Details (indicate cue type and reason) demo & verbal cues on technique including sequence, foot position in relation to edge of curb, step thru to maintain momentum & balance.    High Level Balance   High Level Balance Activities Negotitating around obstacles;Figure 8 turns   High Level Balance Comments demo & verbal cues on turning   Self-Care   Self-Care Lifting   Lifting PT demo & verbal cues on picking up cane from floor with prosthesis remaining flat for support. Set-up for safe practice at home with chair behind her & RW in front. Pt & grandson verbalize understanding.   Prosthetics   Prosthetic Care Comments  PT called prosthetist to recommend lowering height of lateral pre-tibial pad to decrease pressure & tenderness to that area. He plans to set up appt Friday after her PT appt.    Current prosthetic wear tolerance (days/week)  daily   Current prosthetic wear tolerance (#hours/day)  5 hours 2x day with drying midway during 5 hours, 1-2 hours break between  Increase to 6hrs 2x/day after pre-tibial pad lowered on Frid   Residual limb condition  small spot appears to be suture working way out & lateral incision with 49mm superficial wound no signs of infection. no drainage. no pain or tenderness with palpation.                                    Education Provided Residual limb care;Proper Donning;Proper wear schedule/adjustment;Proper weight-bearing schedule/adjustment;Correct ply sock adjustment   Person(s) Educated Patient  adult grandson   Education Method Explanation;Demonstration;Tactile cues;Verbal cues    Education Method Verbalized understanding;Returned demonstration;Tactile cues required;Verbal cues required;Needs further instruction   Donning Prosthesis Supervision   Doffing Prosthesis Modified independent (device/increased time)                  PT Short Term Goals - 05/16/15 0930    PT SHORT TERM GOAL #1   Title Patient demonstrates understanding of prosthetic care with minimal cues required. (Target Date 06/15/2015)   Time 4   Period Weeks   Status New   PT SHORT TERM GOAL #2   Title Patient tolerates wear of prosthesis >12hrs total /day with no negative changes to wound & no tenderness on limb.  (Target Date 06/15/2015)   Time 4   Period Weeks   Status New   PT SHORT TERM GOAL #3   Title Patient reaches 10", rotates shoulders to look behind her and picks up objects from floor without UE support with  supervision.  (Target Date 06/15/2015)   Time 4   Period Weeks   Status New   PT SHORT TERM GOAL #4   Title Patient ambulates 200' with single point cane & prosthesis with supervision.  (Target Date 06/15/2015)   Time 4   Period Weeks   Status New   PT SHORT TERM GOAL #5   Title Patient negotiates ramps, curbs & stairs (1 rail) with single point cane & prosthesis with minimal assist.  (Target Date 06/15/2015)   Time 4   Period Weeks   Status New           PT Long Term Goals - 05/16/15 0930    PT LONG TERM GOAL #1   Title Patient demonstrates / verbalizes safe, independent prosthetic care to enable prosthesis use without issues.  (Target Date 06/15/2015)   Time 4   Period Weeks   Status New   PT LONG TERM GOAL #2   Title Patient tolerates wear of prosthesis >90% of awake hours without skin issues or limb tenderness to enable function throughout her day.  (Target Date 06/15/2015)   Time 4   Period Weeks   Status New   PT LONG TERM GOAL #3   Title Berg Balance >45/56 with prosthesis to reduce rall risk.  (Target Date 06/15/2015)   Time 4   Period Weeks   Status New   PT  LONG TERM GOAL #4   Title Timed Up & Go with prosthesis only <13.5 sec to indicate lower fall risk.  (Target Date 06/15/2015)   Time 4   Period Weeks   Status New   PT LONG TERM GOAL #5   Title Patient ambulates 100' around furniture carrying laundry basket with prosthesis only modified indepent.  (Target Date 06/15/2015)   Time 4   Period Weeks   Status New   Additional Long Term Goals   Additional Long Term Goals Yes   PT LONG TERM GOAL #6   Title Patient amulates 400' with single point cane outside surfaces including grass, ramps & curbs with prosthesis modified independent to enable community mobility.  (Target Date 06/15/2015)   Time 4   Period Weeks   Status New               Plan - 05/30/15 0845    Clinical Impression Statement Patient's tenderness appears to be pre-tibial pad pressing on bone so plan to have it lowered. Patient is improving with cane use with prosthesis but still needs assistance for safety. Patient improved balance, stability with sit to/from stand with instruction & practice.    Pt will benefit from skilled therapeutic intervention in order to improve on the following deficits Abnormal gait;Decreased activity tolerance;Decreased balance;Decreased endurance;Decreased skin integrity;Decreased range of motion;Decreased mobility;Decreased strength;Increased edema;Postural dysfunction;Prosthetic Dependency   Rehab Potential Good   PT Frequency 2x / week   PT Duration 8 weeks   PT Treatment/Interventions ADLs/Self Care Home Management;DME Instruction;Gait training;Stair training;Functional mobility training;Therapeutic activities;Therapeutic exercise;Balance training;Neuromuscular re-education;Patient/family education;Prosthetic Training   PT Next Visit Plan review prosthetic care, HEP midline at sink, gait training on barriers with cane   Consulted and Agree with Plan of Care Patient;Family member/caregiver   Family Member Consulted grandson (adult)         Problem List Patient Active Problem List   Diagnosis Date Noted  . Status post below knee amputation of left lower extremity ([place]) 01/31/2015  . Non-healing wound of lower extremity 01/29/2015  . Ulcer of toe of left foot (  [place]) 12/27/2014  . Essential hypertension 08/22/2014    Jamey Reas PT, DPT 05/31/2015, 7:34 AM  [facility] [address] [place], [name], [address] Phone: [phone]   Fax:  [phone]  Name: [name] MRN: [id number] Date of Birth: [birth year]

## 2015-06-01 ENCOUNTER — Ambulatory Visit: Payer: Medicare Other | Admitting: Physical Therapy

## 2015-06-01 ENCOUNTER — Encounter: Payer: Self-pay | Admitting: Family Medicine

## 2015-06-01 DIAGNOSIS — R531 Weakness: Secondary | ICD-10-CM

## 2015-06-01 DIAGNOSIS — R269 Unspecified abnormalities of gait and mobility: Secondary | ICD-10-CM | POA: Diagnosis not present

## 2015-06-01 DIAGNOSIS — Z7409 Other reduced mobility: Secondary | ICD-10-CM | POA: Diagnosis not present

## 2015-06-01 DIAGNOSIS — R2681 Unsteadiness on feet: Secondary | ICD-10-CM

## 2015-06-01 DIAGNOSIS — R2689 Other abnormalities of gait and mobility: Secondary | ICD-10-CM

## 2015-06-01 DIAGNOSIS — Z5189 Encounter for other specified aftercare: Secondary | ICD-10-CM | POA: Diagnosis not present

## 2015-06-01 DIAGNOSIS — Z4789 Encounter for other orthopedic aftercare: Secondary | ICD-10-CM

## 2015-06-01 DIAGNOSIS — Z89512 Acquired absence of left leg below knee: Secondary | ICD-10-CM | POA: Diagnosis not present

## 2015-06-01 DIAGNOSIS — M24669 Ankylosis, unspecified knee: Secondary | ICD-10-CM | POA: Diagnosis not present

## 2015-06-01 NOTE — Therapy (Signed)
[facility] [address] [place], [name], [address] Phone: [phone]   Fax:  [phone]  Physical Therapy Treatment  Patient Details  Name: [name] MRN: [id number] Date of Birth: [birth year] Referring Provider: Alger Simons, MD  Encounter Date: 06/01/2015      PT End of Session - 06/01/15 0813    Visit Number 5   Number of Visits 17   Date for PT Re-Evaluation 07/13/15   Authorization Type Medicare G-Code & progress report every 10 visits   PT Start Time 0802   PT Stop Time 0845   PT Time Calculation (min) 43 min   Equipment Utilized During Treatment Gait belt   Activity Tolerance Patient tolerated treatment well   Behavior During Therapy [facility] for tasks assessed/performed      Past Medical History  Diagnosis Date  . Hypertension   . PAD (peripheral artery disease) ([place])   . Arthritis   . Cancer ([place])     utertrine    Past Surgical History  Procedure Laterality Date  . Spine surgery    . Knee surgery Right 1984  . Peripheral vascular catheterization N/A 09/15/2014    Procedure: Abdominal Aortogram;  Surgeon: Elam Dutch, MD;  Location: [facility] INVASIVE CV LAB CUPID;  Service: Cardiovascular;  Laterality: N/A;  . Peripheral vascular catheterization Bilateral 09/15/2014    Procedure: Lower Extremity Angiography;  Surgeon: Elam Dutch, MD;  Location: [place];  Service: Cardiovascular;  Laterality: Bilateral;  . Abdominal hysterectomy      partial  . Amputation Left 01/29/2015    Procedure: Left  AMPUTATION BELOW KNEE;  Surgeon: Elam Dutch, MD;  Location: [place];  Service: Vascular;  Laterality: Left;    There were no vitals filed for this visit.  Visit Diagnosis:  Unsteadiness on feet  Balance problems  Impaired functional mobility and activity tolerance  Weakness generalized  Abnormality of gait  Encounter for prosthetic gait training      Subjective  Assessment - 06/01/15 0809    Subjective Reports increased pain with prosthetic wear and unable to bear weight with walker. No falls.   Patient is accompained by: Family member  grandson   Patient Stated Goals She wants to use prosthesis to resume active lifestyle (clean house including heavy duty) go outside to [employer] garden, yard work.    Currently in Pain? Yes   Pain Score 9    Pain Location Leg   Pain Orientation Left   Pain Descriptors / Indicators Sore;Tender;Stabbing;Discomfort   Pain Type Acute pain   Pain Onset In the past 7 days   Aggravating Factors  weight bearing with prosthesis   Pain Relieving Factors taking prosthesis off            [facility] Adult PT Treatment/Exercise - 06/01/15 0828    Transfers   Sit to Stand 5: Supervision;With upper extremity assist;From chair/3-in-1;With armrests   Stand to Sit 5: Supervision;With upper extremity assist;With armrests;To chair/3-in-1   Ambulation/Gait   Ambulation/Gait Yes   Ambulation/Gait Assistance 5: Supervision   Ambulation/Gait Assistance Details pt noted to have valgus knee with gait, even with good weight bearing and hip/pelvis position with gait. Call placed to prosthetist to have adjusted to correct this and also have additional adjustments made after pads removed by him during session.  Ambulation Distance (Feet) 50 Feet   Assistive device Rolling walker;Prosthesis   Gait Pattern Step-through pattern;Decreased arm swing - left;Decreased step length - right;Decreased stance time - left;Decreased stride length;Decreased hip/knee flexion - left;Decreased weight shift to left;Right flexed knee in stance;Left flexed knee in stance;Antalgic;Abducted - left;Trunk flexed;Poor foot clearance - left   Ambulation Surface Level;Indoor   Prosthetics   Prosthetic Care Comments  Pt to clinic today with 9-10/10 pain with prosthetic wear and weight bearing.  Prosthetist called and to clinic. Only had  time to remove pads from socket before going to next appointment. Instructed pt/grandson in ice massage for pain management.                             Current prosthetic wear tolerance (days/week)  daily   Current prosthetic wear tolerance (#hours/day)  5 hours 2x day with drying midway during 5 hours, 1-2 hours break between   Residual limb condition  small spot appears to be suture working way out & lateral incision with 75mm superficial wound no signs of infection. no drainage. no pain or tenderness with palpation.                                    Donning Prosthesis Supervision   Doffing Prosthesis Modified independent (device/increased time)            PT Short Term Goals - 05/16/15 0930    PT SHORT TERM GOAL #1   Title Patient demonstrates understanding of prosthetic care with minimal cues required. (Target Date 06/15/2015)   Time 4   Period Weeks   Status New   PT SHORT TERM GOAL #2   Title Patient tolerates wear of prosthesis >12hrs total /day with no negative changes to wound & no tenderness on limb.  (Target Date 06/15/2015)   Time 4   Period Weeks   Status New   PT SHORT TERM GOAL #3   Title Patient reaches 10", rotates shoulders to look behind her and picks up objects from floor without UE support with supervision.  (Target Date 06/15/2015)   Time 4   Period Weeks   Status New   PT SHORT TERM GOAL #4   Title Patient ambulates 200' with single point cane & prosthesis with supervision.  (Target Date 06/15/2015)   Time 4   Period Weeks   Status New   PT SHORT TERM GOAL #5   Title Patient negotiates ramps, curbs & stairs (1 rail) with single point cane & prosthesis with minimal assist.  (Target Date 06/15/2015)   Time 4   Period Weeks   Status New           PT Long Term Goals - 05/16/15 0930    PT LONG TERM GOAL #1   Title Patient demonstrates / verbalizes safe, independent prosthetic care to enable prosthesis use without issues.  (Target Date 06/15/2015)   Time 4    Period Weeks   Status New   PT LONG TERM GOAL #2   Title Patient tolerates wear of prosthesis >90% of awake hours without skin issues or limb tenderness to enable function throughout her day.  (Target Date 06/15/2015)   Time 4   Period Weeks   Status New   PT LONG TERM GOAL #3   Title Berg Balance >45/56 with prosthesis to reduce rall risk.  (Target Date 06/15/2015)  Time 4   Period Weeks   Status New   PT LONG TERM GOAL #4   Title Timed Up & Go with prosthesis only <13.5 sec to indicate lower fall risk.  (Target Date 06/15/2015)   Time 4   Period Weeks   Status New   PT LONG TERM GOAL #5   Title Patient ambulates 100' around furniture carrying laundry basket with prosthesis only modified indepent.  (Target Date 06/15/2015)   Time 4   Period Weeks   Status New   Additional Long Term Goals   Additional Long Term Goals Yes   PT LONG TERM GOAL #6   Title Patient amulates 400' with single point cane outside surfaces including grass, ramps & curbs with prosthesis modified independent to enable community mobility.  (Target Date 06/15/2015)   Time 4   Period Weeks   Status New            Plan - 06/01/15 0813    Clinical Impression Statement Pt limited in mobility today due to increased pain in residual limb. Pain persisted despite removal of pads and ice massage. Pt/grandson to call and set up appoitment with Gerald Stabs at [facility] for further adjustments to prosthesis. Pt/grandson educated on how to perform ice massage at home for pain management as well. Primary PT notified and approved of ice massage.                             Pt will benefit from skilled therapeutic intervention in order to improve on the following deficits Abnormal gait;Decreased activity tolerance;Decreased balance;Decreased endurance;Decreased skin integrity;Decreased range of motion;Decreased mobility;Decreased strength;Increased edema;Postural dysfunction;Prosthetic Dependency   Rehab Potential Good   PT Frequency 2x /  week   PT Duration 8 weeks   PT Treatment/Interventions ADLs/Self Care Home Management;DME Instruction;Gait training;Stair training;Functional mobility training;Therapeutic activities;Therapeutic exercise;Balance training;Neuromuscular re-education;Patient/family education;Prosthetic Training   PT Next Visit Plan review prosthetic care, HEP midline at sink, gait training on barriers with cane   Consulted and Agree with Plan of Care Patient;Family member/caregiver   Family Member Consulted grandson (adult)        Problem List Patient Active Problem List   Diagnosis Date Noted  . Status post below knee amputation of left lower extremity ([place]) 01/31/2015  . Non-healing wound of lower extremity 01/29/2015  . Ulcer of toe of left foot ([place]) 12/27/2014  . Essential hypertension 08/22/2014    Willow Ora 06/01/2015, 3:59 PM  Willow Ora, PTA, [facility] [address], [place], [place] [address] [phone] 06/01/2015, 4:02 PM   Name: [name] MRN: [id number] Date of Birth: Jun 20, 1931

## 2015-06-02 ENCOUNTER — Encounter: Payer: Self-pay | Admitting: Family Medicine

## 2015-06-02 ENCOUNTER — Ambulatory Visit (INDEPENDENT_AMBULATORY_CARE_PROVIDER_SITE_OTHER): Payer: Medicare Other | Admitting: Family Medicine

## 2015-06-02 VITALS — BP 158/72 | HR 88 | Temp 98.3°F | Resp 18

## 2015-06-02 DIAGNOSIS — Z5181 Encounter for therapeutic drug level monitoring: Secondary | ICD-10-CM | POA: Diagnosis not present

## 2015-06-02 DIAGNOSIS — I10 Essential (primary) hypertension: Secondary | ICD-10-CM

## 2015-06-02 DIAGNOSIS — Z634 Disappearance and death of family member: Secondary | ICD-10-CM | POA: Insufficient documentation

## 2015-06-02 DIAGNOSIS — R609 Edema, unspecified: Secondary | ICD-10-CM

## 2015-06-02 DIAGNOSIS — R600 Localized edema: Secondary | ICD-10-CM

## 2015-06-02 DIAGNOSIS — I739 Peripheral vascular disease, unspecified: Secondary | ICD-10-CM

## 2015-06-02 LAB — BASIC METABOLIC PANEL
BUN: 10 mg/dL (ref 7–25)
CALCIUM: 9 mg/dL (ref 8.6–10.4)
CO2: 25 mmol/L (ref 20–31)
CREATININE: 0.42 mg/dL — AB (ref 0.60–0.88)
Chloride: 103 mmol/L (ref 98–110)
GLUCOSE: 106 mg/dL — AB (ref 65–99)
Potassium: 3.9 mmol/L (ref 3.5–5.3)
Sodium: 135 mmol/L (ref 135–146)

## 2015-06-02 MED ORDER — CLOPIDOGREL BISULFATE 75 MG PO TABS: 75.0000 mg | ORAL_TABLET | Freq: Every day | ORAL | Status: DC

## 2015-06-02 MED ORDER — HYDROCHLOROTHIAZIDE 25 MG PO TABS: 25.0000 mg | ORAL_TABLET | Freq: Every day | ORAL | Status: DC

## 2015-06-02 NOTE — Progress Notes (Signed)
[facility] [address] [place] [address] [phone]  Date:  06/02/2015   Name:  [name]   DOB:  [birth year]   MRN:  [id number]  PCP:  Lamar Blinks, MD    Chief Complaint: Medication Refill   History of Present Illness:  [name] is a 82 y.o. very pleasant female patient who presents with the following:  Here today for a recheck and refill of her plavix medicatoin She underwent a left BKA last year for vascular disease.   Overall she is doing very well.  She has less pain now that the chronically painful leg has been removed.   However the right leg is also a problem and she will undergo a right BKA as well at some point-  However we hope this will be some time in the future.  She does have chronic edema of the right leg and is wearing a compression stocking.   She is doing her PT and getting along very well, using her cane and walker when needed.  Making great progress  She has gotten [facility] shots [facility] on [address]- we called to get these for documentation Very sadly since I last saw her her only child- a son- passed away.  It seems that he had an MI while on his job site and fell, hitting his head and eventually dying.  She is here today with her grandson  Patient Active Problem List   Diagnosis Date Noted  . Status post below knee amputation of left lower extremity ([place]) 01/31/2015  . Non-healing wound of lower extremity 01/29/2015  . Ulcer of toe of left foot ([place]) 12/27/2014  . Essential hypertension 08/22/2014    Past Medical History  Diagnosis Date  . Hypertension   . PAD (peripheral artery disease) ([place])   . Arthritis   . Cancer ([place])     utertrine    Past Surgical History  Procedure Laterality Date  . Spine surgery    . Knee surgery Right 1984  . Peripheral vascular catheterization N/A 09/15/2014    Procedure: Abdominal Aortogram;  Surgeon: Elam Dutch, MD;  Location: [facility] INVASIVE CV LAB CUPID;  Service:  Cardiovascular;  Laterality: N/A;  . Peripheral vascular catheterization Bilateral 09/15/2014    Procedure: Lower Extremity Angiography;  Surgeon: Elam Dutch, MD;  Location: [place];  Service: Cardiovascular;  Laterality: Bilateral;  . Abdominal hysterectomy      partial  . Amputation Left 01/29/2015    Procedure: Left  AMPUTATION BELOW KNEE;  Surgeon: Elam Dutch, MD;  Location: [facility];  Service: Vascular;  Laterality: Left;    Social History  Substance Use Topics  . Smoking status: Never Smoker   . Smokeless tobacco: Never Used  . Alcohol Use: No    Family History  Problem Relation Age of Onset  . Diabetes Mother   . Heart disease Mother   . Dementia Sister   . Diabetes Son     No Known Allergies  Medication list has been reviewed and updated.  Current Outpatient Prescriptions on File Prior to Visit  Medication Sig Dispense Refill  . clopidogrel (PLAVIX) 75 MG tablet Take 1 tablet (75 mg total) by mouth daily. 30 tablet 2  . hydrochlorothiazide (HYDRODIURIL) 12.5 MG tablet Take 1 tablet (12.5 mg total) by mouth daily. 30 tablet 3  . omeprazole (PRILOSEC) 20 MG capsule Take 20 mg by mouth daily.    Vladimir Faster Glycol-Propyl  Glycol (SYSTANE FREE OP) Place 1 drop into both eyes daily. For dry eyes     No current facility-administered medications on file prior to visit.    Review of Systems:  As per HPI- otherwise negative.   Physical Examination: Filed Vitals:   06/02/15 0812  BP: 158/72  Pulse: 88  Temp: 98.3 F (36.8 C)  Resp: 18   There were no vitals filed for this visit. There is no weight on file to calculate BMI. Ideal Body Weight:    GEN: WDWN, NAD, Non-toxic, A & O x 3, looks well.   HEENT: Atraumatic, Normocephalic. Neck supple. No masses, No LAD. Ears and Nose: No external deformity. CV: RRR, No M/G/R. No JVD. No thrill. No extra heart sounds. PULM: CTA B, no wheezes, crackles, rhonchi. No retractions. No resp. distress. No  accessory muscle use. EXTR: No c/c.  Left BKA.  Right LE with chronic 1+ edema and poor cap refill of the great toe NEURO Normal gait for pt. Uses a left BKA.  Stump is healing well PSYCH: Normally interactive. Conversant. Not depressed or anxious appearing.  Calm demeanor.    Assessment and Plan: PVD (peripheral vascular disease) ([place]) - Plan: clopidogrel (PLAVIX) 75 MG tablet  Essential hypertension - Plan: hydrochlorothiazide (HYDRODIURIL) 25 MG tablet  Peripheral edema - Plan: hydrochlorothiazide (HYDRODIURIL) 25 MG tablet  Medication monitoring encounter - Plan: Basic metabolic panel  Refilled her medications.  Apparently the staff who helps with her rehab had suggested that she go up to 25 mg of HCTZ to work on her swelling.  Her BP is high enough for this change.  Will increase to 25 mg She will plan to see me for a recheck in about 4 months  BP Readings from Last 3 Encounters:  06/02/15 158/72  04/03/15 161/86  03/13/15 152/81     Signed Lamar Blinks, MD

## 2015-06-02 NOTE — Patient Instructions (Signed)
It was great to see you today- I am so sorry to hear about the loss of your son Your leg looks great- keep up the great work with your therapy  I will be in touch with your labs We will increase your HCTZ (water pill) to 25 mg a day; if this causes too many problems with frequent urination please let me know   It would be my honor to continue to see you as a patient at my new office- of course you are also more than welcome to continue seeing one of my partners here at [facility] as well  [facility] at [facility]  Address: [address] [place], [place] [address] Phone: [phone]

## 2015-06-06 ENCOUNTER — Encounter: Payer: Self-pay | Admitting: Physical Therapy

## 2015-06-06 ENCOUNTER — Ambulatory Visit: Payer: Medicare Other | Admitting: Physical Therapy

## 2015-06-06 ENCOUNTER — Encounter: Payer: Self-pay | Admitting: Family Medicine

## 2015-06-06 DIAGNOSIS — R531 Weakness: Secondary | ICD-10-CM | POA: Diagnosis not present

## 2015-06-06 DIAGNOSIS — Z5189 Encounter for other specified aftercare: Secondary | ICD-10-CM | POA: Diagnosis not present

## 2015-06-06 DIAGNOSIS — Z89512 Acquired absence of left leg below knee: Secondary | ICD-10-CM

## 2015-06-06 DIAGNOSIS — R2681 Unsteadiness on feet: Secondary | ICD-10-CM

## 2015-06-06 DIAGNOSIS — R269 Unspecified abnormalities of gait and mobility: Secondary | ICD-10-CM

## 2015-06-06 DIAGNOSIS — Z4789 Encounter for other orthopedic aftercare: Secondary | ICD-10-CM

## 2015-06-06 DIAGNOSIS — Z7409 Other reduced mobility: Secondary | ICD-10-CM

## 2015-06-06 DIAGNOSIS — M24669 Ankylosis, unspecified knee: Secondary | ICD-10-CM | POA: Diagnosis not present

## 2015-06-06 DIAGNOSIS — R2689 Other abnormalities of gait and mobility: Secondary | ICD-10-CM

## 2015-06-06 NOTE — Therapy (Signed)
[facility] [address] [place], [name], [address] Phone: [phone]   Fax:  [phone]  Physical Therapy Treatment  Patient Details  Name: [name] MRN: [id number] Date of Birth: 1932/01/17 Referring Provider: Alger Simons, MD  Encounter Date: 06/06/2015      PT End of Session - 06/06/15 0845    Visit Number 6   Number of Visits 17   Date for PT Re-Evaluation 07/13/15   Authorization Type Medicare G-Code & progress report every 10 visits   PT Start Time 0805   PT Stop Time 0845   PT Time Calculation (min) 40 min   Equipment Utilized During Treatment Gait belt   Activity Tolerance Patient tolerated treatment well   Behavior During Therapy [facility] for tasks assessed/performed      Past Medical History  Diagnosis Date  . Hypertension   . PAD (peripheral artery disease) ([place])   . Arthritis   . Cancer ([place])     utertrine    Past Surgical History  Procedure Laterality Date  . Spine surgery    . Knee surgery Right 1984  . Peripheral vascular catheterization N/A 09/15/2014    Procedure: Abdominal Aortogram;  Surgeon: Elam Dutch, MD;  Location: [facility] INVASIVE CV LAB CUPID;  Service: Cardiovascular;  Laterality: N/A;  . Peripheral vascular catheterization Bilateral 09/15/2014    Procedure: Lower Extremity Angiography;  Surgeon: Elam Dutch, MD;  Location: [place];  Service: Cardiovascular;  Laterality: Bilateral;  . Abdominal hysterectomy      partial  . Amputation Left 01/29/2015    Procedure: Left  AMPUTATION BELOW KNEE;  Surgeon: Elam Dutch, MD;  Location: [place];  Service: Vascular;  Laterality: Left;    There were no vitals filed for this visit.  Visit Diagnosis:  Unsteadiness on feet  Balance problems  Impaired functional mobility and activity tolerance  Weakness generalized  Abnormality of gait  Encounter for prosthetic gait training  Status post below knee  amputation of left lower extremity ([facility])      Subjective Assessment - 06/06/15 0806    Subjective Her limb is still sore but a little better. She is wearing prosthesis 5 hrs 2x/day.    Currently in Pain? Yes   Pain Score 6   Since last PT session, worst 8/10, best 0/10   Pain Location Leg   Pain Orientation Left   Pain Descriptors / Indicators Sore;Tender;Stabbing;Discomfort   Pain Type Acute pain   Pain Onset In the past 7 days   Pain Frequency Intermittent   Aggravating Factors  weight bearing with prosthesis   Pain Relieving Factors removing prosthesis off   Effect of Pain on Daily Activities limiting standing Duanne Guess                         [facility] Adult PT Treatment/Exercise - 06/06/15 0800    Transfers   Sit to Stand 5: Supervision;With upper extremity assist;From chair/3-in-1;With armrests   Stand to Sit 5: Supervision;With upper extremity assist;With armrests;To chair/3-in-1   Ambulation/Gait   Ambulation/Gait Yes   Ambulation/Gait Assistance 5: Supervision   Ambulation/Gait Assistance Details cues on step width and posture, use RW due to limb discomfort   Ambulation Distance (Feet) 100 Feet  100' X 2, 50' X 1   Assistive device Rolling walker;Prosthesis   Gait Pattern Step-through pattern;Decreased arm swing - left;Decreased step length - right;Decreased stance time - left;Decreased stride length;Decreased hip/knee flexion -  left;Decreased weight shift to left;Right flexed knee in stance;Left flexed knee in stance;Antalgic;Abducted - left;Trunk flexed;Poor foot clearance - left   Ambulation Surface Indoor;Level   Prosthetics   Prosthetic Care Comments  PT called prosthetist to request increasing hamstring relief areas and rounding popliteal area.    Current prosthetic wear tolerance (days/week)  daily   Current prosthetic wear tolerance (#hours/day)  5 hours 2x day with drying midway during 5 hours, 1-2 hours break between   Residual limb condition   lateral wound appears to be healing. 2 areas appear suture working way out.    Education Provided Residual limb care;Correct ply sock adjustment;Proper Donning;Proper wear schedule/adjustment   Person(s) Educated Patient;Other (comment)  grandson   Education Method Explanation;Verbal cues;Demonstration   Education Method Verbalized understanding;Verbal cues required;Tactile cues required   Donning Prosthesis Supervision   Doffing Prosthesis Modified independent (device/increased time)                PT Education - 06/06/15 0845    Education provided Yes   Education Details standing HEP at sink for proprioception, balance & strength   Person(s) Educated Patient;Other (comment)  grandson   Methods Explanation;Demonstration;Tactile cues;Verbal cues;Handout   Comprehension Verbalized understanding;Returned demonstration;Verbal cues required;Tactile cues required          PT Short Term Goals - 05/16/15 0930    PT SHORT TERM GOAL #1   Title Patient demonstrates understanding of prosthetic care with minimal cues required. (Target Date 06/15/2015)   Time 4   Period Weeks   Status New   PT SHORT TERM GOAL #2   Title Patient tolerates wear of prosthesis >12hrs total /day with no negative changes to wound & no tenderness on limb.  (Target Date 06/15/2015)   Time 4   Period Weeks   Status New   PT SHORT TERM GOAL #3   Title Patient reaches 10", rotates shoulders to look behind her and picks up objects from floor without UE support with supervision.  (Target Date 06/15/2015)   Time 4   Period Weeks   Status New   PT SHORT TERM GOAL #4   Title Patient ambulates 200' with single point cane & prosthesis with supervision.  (Target Date 06/15/2015)   Time 4   Period Weeks   Status New   PT SHORT TERM GOAL #5   Title Patient negotiates ramps, curbs & stairs (1 rail) with single point cane & prosthesis with minimal assist.  (Target Date 06/15/2015)   Time 4   Period Weeks   Status New            PT Long Term Goals - 05/16/15 0930    PT LONG TERM GOAL #1   Title Patient demonstrates / verbalizes safe, independent prosthetic care to enable prosthesis use without issues.  (Target Date 06/15/2015)   Time 4   Period Weeks   Status New   PT LONG TERM GOAL #2   Title Patient tolerates wear of prosthesis >90% of awake hours without skin issues or limb tenderness to enable function throughout her day.  (Target Date 06/15/2015)   Time 4   Period Weeks   Status New   PT LONG TERM GOAL #3   Title Berg Balance >45/56 with prosthesis to reduce rall risk.  (Target Date 06/15/2015)   Time 4   Period Weeks   Status New   PT LONG TERM GOAL #4   Title Timed Up & Go with prosthesis only <13.5 sec to indicate lower fall  risk.  (Target Date 06/15/2015)   Time 4   Period Weeks   Status New   PT LONG TERM GOAL #5   Title Patient ambulates 100' around furniture carrying laundry basket with prosthesis only modified indepent.  (Target Date 06/15/2015)   Time 4   Period Weeks   Status New   Additional Long Term Goals   Additional Long Term Goals Yes   PT LONG TERM GOAL #6   Title Patient amulates 400' with single point cane outside surfaces including grass, ramps & curbs with prosthesis modified independent to enable community mobility.  (Target Date 06/15/2015)   Time 4   Period Weeks   Status New               Plan - 06/06/15 0845    Clinical Impression Statement Patient appears to understand HEP to improve propriception using socket - limb interface. Patient's pain is improving but she needs to continue use RW to limit weight bearing.    Pt will benefit from skilled therapeutic intervention in order to improve on the following deficits Abnormal gait;Decreased activity tolerance;Decreased balance;Decreased endurance;Decreased skin integrity;Decreased range of motion;Decreased mobility;Decreased strength;Increased edema;Postural dysfunction;Prosthetic Dependency   Rehab Potential Good    PT Frequency 2x / week   PT Duration 8 weeks   PT Treatment/Interventions ADLs/Self Care Home Management;DME Instruction;Gait training;Stair training;Functional mobility training;Therapeutic activities;Therapeutic exercise;Balance training;Neuromuscular re-education;Patient/family education;Prosthetic Training   PT Next Visit Plan review prosthetic care gait training on barriers   PT Home Exercise Plan 06/06/2015 HEP at sink   Consulted and Agree with Plan of Care Patient;Family member/caregiver   Family Member Consulted grandson (adult)        Problem List Patient Active Problem List   Diagnosis Date Noted  . Loss or death of child 06/14/2015  . Status post below knee amputation of left lower extremity ([place]) 01/31/2015  . Non-healing wound of lower extremity 01/29/2015  . Ulcer of toe of left foot ([place]) 12/27/2014  . Essential hypertension 08/22/2014    Jamey Reas PT, DPT 06/06/2015, 9:01 PM  [facility] [address] [place], [name], [address] Phone: [phone]   Fax:  [phone]  Name: [name] MRN: [id number] Date of Birth: Sep 22, 1931

## 2015-06-06 NOTE — Patient Instructions (Signed)
Do each exercise 1-2  times per day Do each exercise 10 repetitions Hold each exercise for 2-4 seconds to feel your location  AT [place].  USE TAPE ON FLOOR TO MARK THE MIDLINE POSITION. You also should try to feel with your limb pressure in socket.  You are trying to feel with limb what you used to feel with the bottom of your foot.  1. Side to Side Shift: Moving your hips only (not shoulders): move weight onto your left leg, HOLD/FEEL.  Move back to equal weight on each leg, HOLD/FEEL. Move weight onto your right leg, HOLD/FEEL. Move back to equal weight on each leg, HOLD/FEEL. Repeat. 2. Front to Back Shift: Moving your hips only (not shoulders): move your weight forward onto your toes, HOLD/FEEL. Move your weight back to equal Flat Foot on both legs, HOLD/FEEL. Move your weight back onto your heels, HOLD/FEEL. Move your weight back to equal on both legs, HOLD/FEEL. Repeat. 3. Moving Cones / Cups: With equal weight on each leg: Hold on with one hand the first time, then progress to no hand supports. Move cups from one side of sink to the other. Place cups ~2" out of your reach, progress to 10" beyond reach. 4. Overhead/Upward Reaching: alternated reaching up to top cabinets or ceiling if no cabinets present. Keep equal weight on each leg. Start with one hand support on counter while other hand reaches and progress to no hand support with reaching. 5.   Looking Over Shoulders: With equal weight on each leg: alternate turning to look          over your shoulders with one hand support on counter as needed. Shift weight to             side looking, pull hip then shoulder then head/eyes around to look behind you. Start       with one hand support & progress to no hand support.

## 2015-06-08 ENCOUNTER — Ambulatory Visit: Payer: Medicare Other | Admitting: Physical Therapy

## 2015-06-08 ENCOUNTER — Encounter: Payer: Self-pay | Admitting: Physical Therapy

## 2015-06-08 DIAGNOSIS — R2689 Other abnormalities of gait and mobility: Secondary | ICD-10-CM

## 2015-06-08 DIAGNOSIS — Z4789 Encounter for other orthopedic aftercare: Secondary | ICD-10-CM

## 2015-06-08 DIAGNOSIS — M24669 Ankylosis, unspecified knee: Secondary | ICD-10-CM | POA: Diagnosis not present

## 2015-06-08 DIAGNOSIS — R2681 Unsteadiness on feet: Secondary | ICD-10-CM

## 2015-06-08 DIAGNOSIS — Z5189 Encounter for other specified aftercare: Secondary | ICD-10-CM | POA: Diagnosis not present

## 2015-06-08 DIAGNOSIS — Z89512 Acquired absence of left leg below knee: Secondary | ICD-10-CM | POA: Diagnosis not present

## 2015-06-08 DIAGNOSIS — Z7409 Other reduced mobility: Secondary | ICD-10-CM | POA: Diagnosis not present

## 2015-06-08 DIAGNOSIS — R531 Weakness: Secondary | ICD-10-CM

## 2015-06-08 DIAGNOSIS — R269 Unspecified abnormalities of gait and mobility: Secondary | ICD-10-CM

## 2015-06-08 NOTE — Therapy (Signed)
[facility] [address] [place], [name], [address] Phone: [phone]   Fax:  [phone]  Physical Therapy Treatment  Patient Details  Name: [name] MRN: [id number] Date of Birth: [birth year] Referring Provider: Alger Simons, MD  Encounter Date: 06/08/2015      PT End of Session - 06/08/15 0856    Visit Number 7   Number of Visits 17   Date for PT Re-Evaluation 07/13/15   Authorization Type Medicare G-Code & progress report every 10 visits   PT Start Time 0847   PT Stop Time 0933   PT Time Calculation (min) 46 min   Equipment Utilized During Treatment Gait belt   Activity Tolerance Patient tolerated treatment well   Behavior During Therapy [facility] for tasks assessed/performed      Past Medical History  Diagnosis Date  . Hypertension   . PAD (peripheral artery disease) ([place])   . Arthritis   . Cancer ([place])     utertrine    Past Surgical History  Procedure Laterality Date  . Spine surgery    . Knee surgery Right 1984  . Peripheral vascular catheterization N/A 09/15/2014    Procedure: Abdominal Aortogram;  Surgeon: Elam Dutch, MD;  Location: [facility] INVASIVE CV LAB CUPID;  Service: Cardiovascular;  Laterality: N/A;  . Peripheral vascular catheterization Bilateral 09/15/2014    Procedure: Lower Extremity Angiography;  Surgeon: Elam Dutch, MD;  Location: [place];  Service: Cardiovascular;  Laterality: Bilateral;  . Abdominal hysterectomy      partial  . Amputation Left 01/29/2015    Procedure: Left  AMPUTATION BELOW KNEE;  Surgeon: Elam Dutch, MD;  Location: [place];  Service: Vascular;  Laterality: Left;    There were no vitals filed for this visit.  Visit Diagnosis:  Unsteadiness on feet  Balance problems  Impaired functional mobility and activity tolerance  Weakness generalized  Abnormality of gait  Encounter for prosthetic gait training      Subjective  Assessment - 06/08/15 0852    Subjective Got her Md report back yesterday from Saturday's appointment. Lab levels were okay.    Patient is accompained by: Family member  grandson   Patient Stated Goals She wants to use prosthesis to resume active lifestyle (clean house including heavy duty) go outside to [employer] garden, yard work.    Currently in Pain? Yes   Pain Score 1    Pain Location Leg   Pain Orientation Left   Pain Descriptors / Indicators Sore;Tender;Discomfort   Pain Type Acute pain   Pain Onset In the past 7 days   Pain Frequency Intermittent   Aggravating Factors  weight bearing with prosthesis   Pain Relieving Factors removing prosthesis             [facility] Adult PT Treatment/Exercise - 06/08/15 0908    Transfers   Sit to Stand 5: Supervision;With upper extremity assist;From bed   Stand to Sit 5: Supervision;With upper extremity assist;To bed   Ambulation/Gait   Ambulation/Gait Yes   Ambulation/Gait Assistance 5: Supervision   Ambulation Distance (Feet) 115 Feet  x 3, 230 x1   Assistive device Rolling walker;Prosthesis   Gait Pattern Step-through pattern;Decreased arm swing - left;Decreased step length - right;Decreased stance time - left;Decreased stride length;Decreased hip/knee flexion - left;Decreased weight shift to left;Right flexed knee in stance;Left flexed knee in stance;Antalgic;Abducted - left;Trunk flexed;Poor foot clearance - left   Ambulation Surface Level;Indoor   Prosthetics  Current prosthetic wear tolerance (days/week)  daily   Current prosthetic wear tolerance (#hours/day)  5 hours 2x day with drying midway during 5 hours, 1-2 hours break between   Residual limb condition  lateral wound appears to be healing. 2 areas appear suture working way out.    Education Provided Residual limb care;Correct ply sock adjustment;Proper Donning;Proper wear schedule/adjustment   Person(s) Educated Patient;Other (comment)  adult grandson   Education Method  Explanation;Verbal cues   Education Method Verbalized understanding;Verbal cues required   Donning Prosthesis Supervision   Doffing Prosthesis Modified independent (device/increased time)              PT Short Term Goals - 05/16/15 0930    PT SHORT TERM GOAL #1   Title Patient demonstrates understanding of prosthetic care with minimal cues required. (Target Date 06/15/2015)   Time 4   Period Weeks   Status New   PT SHORT TERM GOAL #2   Title Patient tolerates wear of prosthesis >12hrs total /day with no negative changes to wound & no tenderness on limb.  (Target Date 06/15/2015)   Time 4   Period Weeks   Status New   PT SHORT TERM GOAL #3   Title Patient reaches 10", rotates shoulders to look behind her and picks up objects from floor without UE support with supervision.  (Target Date 06/15/2015)   Time 4   Period Weeks   Status New   PT SHORT TERM GOAL #4   Title Patient ambulates 200' with single point cane & prosthesis with supervision.  (Target Date 06/15/2015)   Time 4   Period Weeks   Status New   PT SHORT TERM GOAL #5   Title Patient negotiates ramps, curbs & stairs (1 rail) with single point cane & prosthesis with minimal assist.  (Target Date 06/15/2015)   Time 4   Period Weeks   Status New           PT Long Term Goals - 05/16/15 0930    PT LONG TERM GOAL #1   Title Patient demonstrates / verbalizes safe, independent prosthetic care to enable prosthesis use without issues.  (Target Date 06/15/2015)   Time 4   Period Weeks   Status New   PT LONG TERM GOAL #2   Title Patient tolerates wear of prosthesis >90% of awake hours without skin issues or limb tenderness to enable function throughout her day.  (Target Date 06/15/2015)   Time 4   Period Weeks   Status New   PT LONG TERM GOAL #3   Title Berg Balance >45/56 with prosthesis to reduce rall risk.  (Target Date 06/15/2015)   Time 4   Period Weeks   Status New   PT LONG TERM GOAL #4   Title Timed Up & Go with  prosthesis only <13.5 sec to indicate lower fall risk.  (Target Date 06/15/2015)   Time 4   Period Weeks   Status New   PT LONG TERM GOAL #5   Title Patient ambulates 100' around furniture carrying laundry basket with prosthesis only modified indepent.  (Target Date 06/15/2015)   Time 4   Period Weeks   Status New   Additional Long Term Goals   Additional Long Term Goals Yes   PT LONG TERM GOAL #6   Title Patient amulates 400' with single point cane outside surfaces including grass, ramps & curbs with prosthesis modified independent to enable community mobility.  (Target Date 06/15/2015)   Time 4  Period Weeks   Status New           Plan - 06/08/15 0856    Clinical Impression Statement Pt with fit issues this session. Sock ply continously adjusted with prosthesis continuing to internally rotate with gait. Reached a point where prosthesis would not go on with increased sock ply (7 ply), however continued to rotate with only 6 ply.  Attempted cut off sock without success. Pt also noted to have lateral lean of pylon with weight bearing/shift during gait. Call placed to prosthetist and pt going there after here for adjustments (including the ones called to prostthetist during previous sessions). Pt making steady progress toward goals.                                                                                       Pt will benefit from skilled therapeutic intervention in order to improve on the following deficits Abnormal gait;Decreased activity tolerance;Decreased balance;Decreased endurance;Decreased skin integrity;Decreased range of motion;Decreased mobility;Decreased strength;Increased edema;Postural dysfunction;Prosthetic Dependency   Rehab Potential Good   PT Frequency 2x / week   PT Duration 8 weeks   PT Treatment/Interventions ADLs/Self Care Home Management;DME Instruction;Gait training;Stair training;Functional mobility training;Therapeutic activities;Therapeutic exercise;Balance  training;Neuromuscular re-education;Patient/family education;Prosthetic Training   PT Next Visit Plan review prosthetic care gait training on barriers   PT Home Exercise Plan 06/06/2015 HEP at sink   Consulted and Agree with Plan of Care Patient;Family member/caregiver   Family Member Consulted grandson (adult)        Problem List Patient Active Problem List   Diagnosis Date Noted  . Loss or death of child 06/21/2015  . Status post below knee amputation of left lower extremity ([place]) 01/31/2015  . Non-healing wound of lower extremity 01/29/2015  . Ulcer of toe of left foot ([place]) 12/27/2014  . Essential hypertension 08/22/2014    Willow Ora 06/08/2015, 4:42 PM  Willow Ora, PTA, [facility] [address] [place], [place] [address] [phone] 06/08/2015, 4:42 PM   Name: [name] MRN: [id number] Date of Birth: 01-12-1932

## 2015-06-13 ENCOUNTER — Ambulatory Visit: Payer: Medicare Other | Attending: Physical Medicine & Rehabilitation | Admitting: Physical Therapy

## 2015-06-13 ENCOUNTER — Encounter: Payer: Self-pay | Admitting: Physical Therapy

## 2015-06-13 DIAGNOSIS — R29818 Other symptoms and signs involving the nervous system: Secondary | ICD-10-CM | POA: Insufficient documentation

## 2015-06-13 DIAGNOSIS — R531 Weakness: Secondary | ICD-10-CM

## 2015-06-13 DIAGNOSIS — R269 Unspecified abnormalities of gait and mobility: Secondary | ICD-10-CM

## 2015-06-13 DIAGNOSIS — Z4789 Encounter for other orthopedic aftercare: Secondary | ICD-10-CM

## 2015-06-13 DIAGNOSIS — Z5189 Encounter for other specified aftercare: Secondary | ICD-10-CM | POA: Insufficient documentation

## 2015-06-13 DIAGNOSIS — Z7409 Other reduced mobility: Secondary | ICD-10-CM | POA: Diagnosis not present

## 2015-06-13 DIAGNOSIS — Z89512 Acquired absence of left leg below knee: Secondary | ICD-10-CM

## 2015-06-13 DIAGNOSIS — R2681 Unsteadiness on feet: Secondary | ICD-10-CM

## 2015-06-13 DIAGNOSIS — R2689 Other abnormalities of gait and mobility: Secondary | ICD-10-CM

## 2015-06-14 NOTE — Therapy (Signed)
[facility] [address] [place], [name], [address] Phone: [phone]   Fax:  [phone]  Physical Therapy Treatment  Patient Details  Name: [name] MRN: [id number] Date of Birth: [birth year] Referring Provider: Alger Simons, MD  Encounter Date: 06/13/2015      PT End of Session - 06/13/15 0845    Visit Number 8   Number of Visits 17   Date for PT Re-Evaluation 07/13/15   Authorization Type Medicare G-Code & progress report every 10 visits   PT Start Time 0801   PT Stop Time 0845   PT Time Calculation (min) 44 min   Equipment Utilized During Treatment Gait belt   Activity Tolerance Patient tolerated treatment well   Behavior During Therapy [facility] for tasks assessed/performed      Past Medical History  Diagnosis Date  . Hypertension   . PAD (peripheral artery disease) ([place])   . Arthritis   . Cancer ([place])     utertrine    Past Surgical History  Procedure Laterality Date  . Spine surgery    . Knee surgery Right 1984  . Peripheral vascular catheterization N/A 09/15/2014    Procedure: Abdominal Aortogram;  Surgeon: Elam Dutch, MD;  Location: [facility] INVASIVE CV LAB CUPID;  Service: Cardiovascular;  Laterality: N/A;  . Peripheral vascular catheterization Bilateral 09/15/2014    Procedure: Lower Extremity Angiography;  Surgeon: Elam Dutch, MD;  Location: [place];  Service: Cardiovascular;  Laterality: Bilateral;  . Abdominal hysterectomy      partial  . Amputation Left 01/29/2015    Procedure: Left  AMPUTATION BELOW KNEE;  Surgeon: Elam Dutch, MD;  Location: [facility];  Service: Vascular;  Laterality: Left;    There were no vitals filed for this visit.  Visit Diagnosis:  Unsteadiness on feet  Balance problems  Impaired functional mobility and activity tolerance  Weakness generalized  Abnormality of gait  Encounter for prosthetic gait training  Status post below knee  amputation of left lower extremity ([facility])      Subjective Assessment - 06/13/15 0802    Subjective Saw prosthetist Friday & Monday (2 days ago) and better since Monday. She can still feel that one spot where wound is.    Currently in Pain? Yes   Pain Score 3    Pain Location Leg   Pain Orientation Left   Pain Descriptors / Indicators Sore;Tender;Discomfort   Pain Type Acute pain   Pain Onset 1 to 4 weeks ago   Pain Frequency Intermittent   Aggravating Factors  weight bearing with prosthesis   Pain Relieving Factors removing prosthesis                         [facility] Adult PT Treatment/Exercise - 06/13/15 0800    Transfers   Sit to Stand 5: Supervision;With upper extremity assist;From chair/3-in-1  cues from chairs without armrests   Stand to Sit 5: Supervision;With upper extremity assist;To chair/3-in-1  cues on chairs without armrests   Ambulation/Gait   Ambulation/Gait Yes   Ambulation/Gait Assistance 5: Supervision   Ambulation/Gait Assistance Details verbal cues on step length & step width   Ambulation Distance (Feet) 115 Feet  115' X 3   Assistive device Rolling walker;Prosthesis   Gait Pattern Step-through pattern;Decreased arm swing - left;Decreased step length - right;Decreased stance time - left;Decreased stride length;Decreased hip/knee flexion - left;Decreased weight shift to left;Right flexed knee in stance;Left flexed knee  in stance;Antalgic;Abducted - left;Trunk flexed;Poor foot clearance - left   Ambulation Surface Indoor;Level   Stairs Yes   Stairs Assistance 4: Min guard;5: Supervision   Stairs Assistance Details (indicate cue type and reason) PT demo & instructed in reciprocal pattern and manual / verbal cues during   Stair Management Technique Two rails;Alternating pattern;Forwards   Number of Stairs 4  3 reps   Self-Care   Self-Care Lifting;Other Self-Care Comments   Lifting PT demo, instructed in technique to lift box & objects from floor.  Patient able to lift washcloth & 10# box from floor to chest with supervision & cues.    Other Self-Care Comments  PT demo sweeping, mopping & vacuuming with offset foot positon & wt shifting to move device. Patient return demo sweeping with RW turned behind her for safety with PT providing manual cues for wt shift initially.    Prosthetics   Current prosthetic wear tolerance (days/week)  daily   Current prosthetic wear tolerance (#hours/day)  5 hours 2x day with drying midway during 5 hours, 1-2 hours break between  PT did not increase yet due to pain in limb   Residual limb condition  lateral wound appears to be healing. 2 areas appear suture working way out.    Education Provided Residual limb care;Correct ply sock adjustment;Proper Donning;Proper wear schedule/adjustment   Person(s) Educated Patient;Other (comment)  grandson   Education Method Explanation;Verbal cues   Education Method Verbalized understanding;Verbal cues required   Donning Prosthesis Supervision   Doffing Prosthesis Modified independent (device/increased time)                  PT Short Term Goals - 06/13/15 0845    PT SHORT TERM GOAL #1   Title Patient demonstrates understanding of prosthetic care with minimal cues required. (Target Date 06/15/2015)   Baseline MET 06/13/2015   Time 4   Period Weeks   Status Achieved   PT SHORT TERM GOAL #2   Title Patient tolerates wear of prosthesis >12hrs total /day with no negative changes to wound & no tenderness on limb.  (Target Date 06/15/2015)   Baseline Partially MET 06/13/2015  Patient tolerating wear up to 10 hrs total /day with no negative changes to wound but tenderness in limb that appears to be improving.    Time 4   Period Weeks   Status Partially Met   PT SHORT TERM GOAL #3   Title Patient reaches 10", rotates shoulders to look behind her and picks up objects from floor without UE support with supervision.  (Target Date 06/15/2015)   Baseline MET 06/13/2015   Time  4   Period Weeks   Status Achieved   PT SHORT TERM GOAL #4   Title Patient ambulates 200' with single point cane & prosthesis with supervision.  (Target Date 06/15/2015)   Time 4   Period Weeks   Status New   PT SHORT TERM GOAL #5   Title Patient negotiates ramps, curbs & stairs (1 rail) with single point cane & prosthesis with minimal assist.  (Target Date 06/15/2015)   Time 4   Period Weeks   Status New           PT Long Term Goals - 05/16/15 0930    PT LONG TERM GOAL #1   Title Patient demonstrates / verbalizes safe, independent prosthetic care to enable prosthesis use without issues.  (Target Date 06/15/2015)   Time 4   Period Weeks   Status New   PT  LONG TERM GOAL #2   Title Patient tolerates wear of prosthesis >90% of awake hours without skin issues or limb tenderness to enable function throughout her day.  (Target Date 06/15/2015)   Time 4   Period Weeks   Status New   PT LONG TERM GOAL #3   Title Berg Balance >45/56 with prosthesis to reduce rall risk.  (Target Date 06/15/2015)   Time 4   Period Weeks   Status New   PT LONG TERM GOAL #4   Title Timed Up & Go with prosthesis only <13.5 sec to indicate lower fall risk.  (Target Date 06/15/2015)   Time 4   Period Weeks   Status New   PT LONG TERM GOAL #5   Title Patient ambulates 100' around furniture carrying laundry basket with prosthesis only modified indepent.  (Target Date 06/15/2015)   Time 4   Period Weeks   Status New   Additional Long Term Goals   Additional Long Term Goals Yes   PT LONG TERM GOAL #6   Title Patient amulates 400' with single point cane outside surfaces including grass, ramps & curbs with prosthesis modified independent to enable community mobility.  (Target Date 06/15/2015)   Time 4   Period Weeks   Status New               Plan - 06/13/15 0845    Clinical Impression Statement Patient met or partially met 3 of 5 STGs. Patient improved ability to lift objects up to 10# box and sweep with PT  instructions and repetitions for carryover. Patient's limb appears less tender than last week but continues to limit amount of weight bearing thru prosthesis.    Pt will benefit from skilled therapeutic intervention in order to improve on the following deficits Abnormal gait;Decreased activity tolerance;Decreased balance;Decreased endurance;Decreased skin integrity;Decreased range of motion;Decreased mobility;Decreased strength;Increased edema;Postural dysfunction;Prosthetic Dependency   Rehab Potential Good   PT Frequency 2x / week   PT Duration 8 weeks   PT Treatment/Interventions ADLs/Self Care Home Management;DME Instruction;Gait training;Stair training;Functional mobility training;Therapeutic activities;Therapeutic exercise;Balance training;Neuromuscular re-education;Patient/family education;Prosthetic Training   PT Next Visit Plan Check remaining 2 STGs if patient able to tolerate increased wt bearing with use of cane. review prosthetic care gait training on barriers   PT Home Exercise Plan 06/06/2015 HEP at sink   Consulted and Agree with Plan of Care Patient;Family member/caregiver   Family Member Consulted grandson (adult)        Problem List Patient Active Problem List   Diagnosis Date Noted  . Loss or death of child Jun 08, 2015  . Status post below knee amputation of left lower extremity ([place]) 01/31/2015  . Non-healing wound of lower extremity 01/29/2015  . Ulcer of toe of left foot ([place]) 12/27/2014  . Essential hypertension 08/22/2014    Jamey Reas PT, DPT 06/14/2015, 7:42 AM  [facility] [address] [place], [name], [address] Phone: [phone]   Fax:  [phone]  Name: [name] MRN: [id number] Date of Birth: [birth year]

## 2015-06-15 ENCOUNTER — Encounter: Payer: Self-pay | Admitting: Physical Therapy

## 2015-06-15 ENCOUNTER — Ambulatory Visit: Payer: Medicare Other | Admitting: Physical Therapy

## 2015-06-15 DIAGNOSIS — Z7409 Other reduced mobility: Secondary | ICD-10-CM | POA: Diagnosis not present

## 2015-06-15 DIAGNOSIS — R269 Unspecified abnormalities of gait and mobility: Secondary | ICD-10-CM | POA: Diagnosis not present

## 2015-06-15 DIAGNOSIS — Z5189 Encounter for other specified aftercare: Secondary | ICD-10-CM | POA: Diagnosis not present

## 2015-06-15 DIAGNOSIS — R531 Weakness: Secondary | ICD-10-CM | POA: Diagnosis not present

## 2015-06-15 DIAGNOSIS — Z4789 Encounter for other orthopedic aftercare: Secondary | ICD-10-CM

## 2015-06-15 DIAGNOSIS — R2681 Unsteadiness on feet: Secondary | ICD-10-CM | POA: Diagnosis not present

## 2015-06-15 DIAGNOSIS — R29818 Other symptoms and signs involving the nervous system: Secondary | ICD-10-CM | POA: Diagnosis not present

## 2015-06-15 DIAGNOSIS — R2689 Other abnormalities of gait and mobility: Secondary | ICD-10-CM

## 2015-06-15 NOTE — Therapy (Signed)
[facility] [address] [place], [name], [address] Phone: [phone]   Fax:  [phone]  Physical Therapy Treatment  Patient Details  Name: [name] MRN: [id number] Date of Birth: May 18, 1931 Referring Provider: Alger Simons, MD  Encounter Date: 06/15/2015      PT End of Session - 06/15/15 0807    Visit Number 9   Number of Visits 17   Date for PT Re-Evaluation 07/13/15   Authorization Type Medicare G-Code & progress report every 10 visits   PT Start Time 0803   PT Stop Time 0845   PT Time Calculation (min) 42 min   Equipment Utilized During Treatment Gait belt   Activity Tolerance Patient tolerated treatment well   Behavior During Therapy [facility] for tasks assessed/performed      Past Medical History  Diagnosis Date  . Hypertension   . PAD (peripheral artery disease) ([place])   . Arthritis   . Cancer ([place])     utertrine    Past Surgical History  Procedure Laterality Date  . Spine surgery    . Knee surgery Right 1984  . Peripheral vascular catheterization N/A 09/15/2014    Procedure: Abdominal Aortogram;  Surgeon: Elam Dutch, MD;  Location: [facility] INVASIVE CV LAB CUPID;  Service: Cardiovascular;  Laterality: N/A;  . Peripheral vascular catheterization Bilateral 09/15/2014    Procedure: Lower Extremity Angiography;  Surgeon: Elam Dutch, MD;  Location: [facility];  Service: Cardiovascular;  Laterality: Bilateral;  . Abdominal hysterectomy      partial  . Amputation Left 01/29/2015    Procedure: Left  AMPUTATION BELOW KNEE;  Surgeon: Elam Dutch, MD;  Location: [facility];  Service: Vascular;  Laterality: Left;    There were no vitals filed for this visit.  Visit Diagnosis:  Unsteadiness on feet  Balance problems  Impaired functional mobility and activity tolerance  Weakness generalized  Abnormality of gait  Encounter for prosthetic gait training      Subjective  Assessment - 06/15/15 0807    Subjective No new complaints or changes. No falls or pain to report.   Patient is accompained by: Family member  adult grandson   Patient Stated Goals She wants to use prosthesis to resume active lifestyle (clean house including heavy duty) go outside to [employer] garden, yard work.    Currently in Pain? No/denies   Pain Score 0-No pain          [facility] Adult PT Treatment/Exercise - 06/15/15 0808    Transfers   Sit to Stand 5: Supervision;With upper extremity assist;From chair/3-in-1   Sit to Stand Details Verbal cues for safe use of DME/AE;Verbal cues for technique   Stand to Sit 5: Supervision;With upper extremity assist;To chair/3-in-1   Stand to Sit Details (indicate cue type and reason) Verbal cues for safe use of DME/AE;Verbal cues for technique   Ambulation/Gait   Ambulation/Gait Yes   Ambulation/Gait Assistance 4: Min guard   Ambulation/Gait Assistance Details minimal cues on posture and cane placement.   Ambulation Distance (Feet) 240 Feet  x2 reps, 115 x 2reps   Assistive device Straight cane   Gait Pattern Step-through pattern;Decreased arm swing - left;Decreased step length - right;Decreased stance time - left;Decreased stride length;Decreased hip/knee flexion - left;Decreased weight shift to left;Right flexed knee in stance;Left flexed knee in stance;Antalgic;Abducted - left;Trunk flexed;Poor foot clearance - left   Ambulation Surface Level;Indoor   Stairs Yes   Stairs Assistance 4: Min guard  Stairs Assistance Details (indicate cue type and reason) cues on proper sequencing and technique with one rail/cane    Stair Management Technique One rail Right;One rail Left;Step to pattern;Forwards;With cane   Number of Stairs 4  x 2 reps   Ramp 4: Min assist  with cane/prosthesis   Ramp Details (indicate cue type and reason) x 2 reps with cues on sequencing and technique   Curb 4: Min assist  with cane/prosthesis   Curb Details (indicate cue type  and reason) x 2 reps with cues on sequencing and technique   Prosthetics   Current prosthetic wear tolerance (days/week)  daily   Current prosthetic wear tolerance (#hours/day)  5 hours 2x day with drying midway during 5 hours, 1-2 hours break between   Residual limb condition  lateral wound appears to be healing. 2 areas appear suture working way out.    Education Provided Residual limb care;Correct ply sock adjustment;Proper wear schedule/adjustment;Proper weight-bearing schedule/adjustment   Person(s) Educated Patient;Caregiver(s)   Education Method Explanation;Demonstration;Verbal cues   Education Method Verbalized understanding   Donning Prosthesis Supervision   Doffing Prosthesis Modified independent (device/increased time)           PT Short Term Goals - 06/15/15 0808    PT SHORT TERM GOAL #1   Title Patient demonstrates understanding of prosthetic care with minimal cues required. (Target Date 06/15/2015)   Baseline MET 06/13/2015   Time --   Period --   Status Achieved   PT SHORT TERM GOAL #2   Title Patient tolerates wear of prosthesis >12hrs total /day with no negative changes to wound & no tenderness on limb.  (Target Date 06/15/2015)   Baseline Partially MET 06/13/2015  Patient tolerating wear up to 10 hrs total /day with no negative changes to wound but tenderness in limb that appears to be improving.    Time --   Period --   Status Partially Met   PT SHORT TERM GOAL #3   Title Patient reaches 10", rotates shoulders to look behind her and picks up objects from floor without UE support with supervision.  (Target Date 06/15/2015)   Baseline MET 06/13/2015   Time --   Period --   Status Achieved   PT SHORT TERM GOAL #4   Title Patient ambulates 200' with single point cane & prosthesis with supervision.  (Target Date 06/15/2015)   Baseline 06/15/15: pt able to ambulate the distance, still needing min guard to min assist for balance   Time --   Period --   Status Partially Met   PT  SHORT TERM GOAL #5   Title Patient negotiates ramps, curbs & stairs (1 rail) with single point cane & prosthesis with minimal assist.  (Target Date 06/15/2015)   Baseline met on 06/15/15   Time --   Period --   Status Achieved           PT Long Term Goals - 05/16/15 0930    PT LONG TERM GOAL #1   Title Patient demonstrates / verbalizes safe, independent prosthetic care to enable prosthesis use without issues.  (Target Date 06/15/2015)   Time 4   Period Weeks   Status New   PT LONG TERM GOAL #2   Title Patient tolerates wear of prosthesis >90% of awake hours without skin issues or limb tenderness to enable function throughout her day.  (Target Date 06/15/2015)   Time 4   Period Weeks   Status New   PT LONG TERM GOAL #3  Title [employer] >45/56 with prosthesis to reduce rall risk.  (Target Date 06/15/2015)   Time 4   Period Weeks   Status New   PT LONG TERM GOAL #4   Title Timed Up & Go with prosthesis only <13.5 sec to indicate lower fall risk.  (Target Date 06/15/2015)   Time 4   Period Weeks   Status New   PT LONG TERM GOAL #5   Title Patient ambulates 100' around furniture carrying laundry basket with prosthesis only modified indepent.  (Target Date 06/15/2015)   Time 4   Period Weeks   Status New   Additional Long Term Goals   Additional Long Term Goals Yes   PT LONG TERM GOAL #6   Title Patient amulates 400' with single point cane outside surfaces including grass, ramps & curbs with prosthesis modified independent to enable community mobility.  (Target Date 06/15/2015)   Time 4   Period Weeks   Status New           Plan - 06/15/15 0808    Clinical Impression Statement Pt met 1 of 2 remaining STGs today and has progressed toward the unmet STG. Pt continues to progress with mobility with increased tolerance using straight cane today vs previous weeks sessions. Pt to try cane use at home walking around table only while grandson is home for safety at this tme.                                          Pt will benefit from skilled therapeutic intervention in order to improve on the following deficits Abnormal gait;Decreased activity tolerance;Decreased balance;Decreased endurance;Decreased skin integrity;Decreased range of motion;Decreased mobility;Decreased strength;Increased edema;Postural dysfunction;Prosthetic Dependency   Rehab Potential Good   PT Frequency 2x / week   PT Duration 8 weeks   PT Treatment/Interventions ADLs/Self Care Home Management;DME Instruction;Gait training;Stair training;Functional mobility training;Therapeutic activities;Therapeutic exercise;Balance training;Neuromuscular re-education;Patient/family education;Prosthetic Training   PT Next Visit Plan continue toward LTGs, gait with straight cane including barriers, balance.   PT Home Exercise Plan 06/06/2015 HEP at sink   Consulted and Agree with Plan of Care Patient;Family member/caregiver   Family Member Consulted grandson (adult)        Problem List Patient Active Problem List   Diagnosis Date Noted  . Loss or death of child 07-01-2015  . Status post below knee amputation of left lower extremity ([place]) 01/31/2015  . Non-healing wound of lower extremity 01/29/2015  . Ulcer of toe of left foot ([place]) 12/27/2014  . Essential hypertension 08/22/2014    Willow Ora 06/15/2015, 8:59 AM  Willow Ora, PTA, [facility] [address] [place], [place] [address] [phone] 06/15/2015, 8:59 AM  Name: [name] MRN: [id number] Date of Birth: 03/27/1932

## 2015-06-20 ENCOUNTER — Ambulatory Visit: Payer: Medicare Other | Admitting: Physical Therapy

## 2015-06-20 ENCOUNTER — Encounter: Payer: Self-pay | Admitting: Physical Therapy

## 2015-06-20 DIAGNOSIS — R269 Unspecified abnormalities of gait and mobility: Secondary | ICD-10-CM

## 2015-06-20 DIAGNOSIS — Z4789 Encounter for other orthopedic aftercare: Secondary | ICD-10-CM

## 2015-06-20 DIAGNOSIS — Z7409 Other reduced mobility: Secondary | ICD-10-CM

## 2015-06-20 DIAGNOSIS — R531 Weakness: Secondary | ICD-10-CM

## 2015-06-20 DIAGNOSIS — R29818 Other symptoms and signs involving the nervous system: Secondary | ICD-10-CM | POA: Diagnosis not present

## 2015-06-20 DIAGNOSIS — R2681 Unsteadiness on feet: Secondary | ICD-10-CM | POA: Diagnosis not present

## 2015-06-20 DIAGNOSIS — R2689 Other abnormalities of gait and mobility: Secondary | ICD-10-CM

## 2015-06-20 DIAGNOSIS — Z5189 Encounter for other specified aftercare: Secondary | ICD-10-CM | POA: Diagnosis not present

## 2015-06-20 NOTE — Therapy (Signed)
[facility] [address] [place], [name], [address] Phone: [phone]   Fax:  [phone]  Physical Therapy Treatment  Patient Details  Name: [name] MRN: [id number] Date of Birth: 1931-07-26 Referring Provider: Alger Simons, MD  Encounter Date: 06/20/2015      PT End of Session - 06/20/15 0809    Visit Number 10   Number of Visits 17   Date for PT Re-Evaluation 07/13/15   Authorization Type Medicare G-Code & progress report every 10 visits   PT Start Time 0802   PT Stop Time 0845   PT Time Calculation (min) 43 min   Equipment Utilized During Treatment Gait belt   Activity Tolerance Patient tolerated treatment well   Behavior During Therapy [facility] for tasks assessed/performed      Past Medical History  Diagnosis Date  . Hypertension   . PAD (peripheral artery disease) ([place])   . Arthritis   . Cancer ([place])     utertrine    Past Surgical History  Procedure Laterality Date  . Spine surgery    . Knee surgery Right 1984  . Peripheral vascular catheterization N/A 09/15/2014    Procedure: Abdominal Aortogram;  Surgeon: Elam Dutch, MD;  Location: [facility] INVASIVE CV LAB CUPID;  Service: Cardiovascular;  Laterality: N/A;  . Peripheral vascular catheterization Bilateral 09/15/2014    Procedure: Lower Extremity Angiography;  Surgeon: Elam Dutch, MD;  Location: [facility];  Service: Cardiovascular;  Laterality: Bilateral;  . Abdominal hysterectomy      partial  . Amputation Left 01/29/2015    Procedure: Left  AMPUTATION BELOW KNEE;  Surgeon: Elam Dutch, MD;  Location: [facility];  Service: Vascular;  Laterality: Left;    There were no vitals filed for this visit.  Visit Diagnosis:  Balance problems  Unsteadiness on feet  Impaired functional mobility and activity tolerance  Weakness generalized  Abnormality of gait  Encounter for prosthetic gait training      Subjective  Assessment - 06/20/15 0808    Subjective No new complaints. No falls or pain to report today. Reports yesterday was the best day she has had to date with prosthesis.   Patient is accompained by: Family member  grandson   Patient Stated Goals She wants to use prosthesis to resume active lifestyle (clean house including heavy duty) go outside to [employer] garden, yard work.    Currently in Pain? No/denies   Pain Score 0-No pain             [facility] Adult PT Treatment/Exercise - 06/20/15 0810    Transfers   Sit to Stand 5: Supervision;With upper extremity assist;From chair/3-in-1   Sit to Stand Details Verbal cues for safe use of DME/AE;Verbal cues for technique   Stand to Sit 5: Supervision;With upper extremity assist;To chair/3-in-1   Stand to Sit Details (indicate cue type and reason) Verbal cues for safe use of DME/AE;Verbal cues for technique   Ambulation/Gait   Ambulation/Gait Yes   Ambulation/Gait Assistance 4: Min guard;4: Min assist   Ambulation/Gait Assistance Details occasional cues needed on sequencing, miminal cues on posture with gait. Prosthesis turing in (IR) midway throught each gait trial. sock ply adjusted each time. Noted lateral lean of pylon with stance phase. Prosthetist here for another appointment and noted this as well. Set appointment for Friday am to have adjustements made.  Ambulation Distance (Feet) 240 Feet  x1,  120 x3   Assistive device Straight cane;Rolling walker   Gait Pattern Step-through pattern;Decreased arm swing - left;Decreased step length - right;Decreased stance time - left;Decreased stride length;Decreased hip/knee flexion - left;Decreased weight shift to left;Right flexed knee in stance;Left flexed knee in stance;Antalgic;Abducted - left;Trunk flexed;Poor foot clearance - left   Ambulation Surface Level;Indoor   Prosthetics   Current prosthetic wear tolerance (days/week)  daily   Current prosthetic wear tolerance  (#hours/day)  5 hours 2x day with drying midway during 5 hours, 1-2 hours break between   Residual limb condition  lateral wound appears to be healing. 2 areas appear suture working way out.    Education Provided Residual limb care;Correct ply sock adjustment;Proper wear schedule/adjustment;Proper weight-bearing schedule/adjustment   Person(s) Educated Patient;Caregiver(s)   Education Method Explanation;Verbal cues;Demonstration   Education Method Verbalized understanding;Needs further instruction;Verbal cues required   Donning Prosthesis Supervision   Doffing Prosthesis Modified independent (device/increased time)            PT Short Term Goals - 06/15/15 0808    PT SHORT TERM GOAL #1   Title Patient demonstrates understanding of prosthetic care with minimal cues required. (Target Date 06/15/2015)   Baseline MET 06/13/2015   Time --   Period --   Status Achieved   PT SHORT TERM GOAL #2   Title Patient tolerates wear of prosthesis >12hrs total /day with no negative changes to wound & no tenderness on limb.  (Target Date 06/15/2015)   Baseline Partially MET 06/13/2015  Patient tolerating wear up to 10 hrs total /day with no negative changes to wound but tenderness in limb that appears to be improving.    Time --   Period --   Status Partially Met   PT SHORT TERM GOAL #3   Title Patient reaches 10", rotates shoulders to look behind her and picks up objects from floor without UE support with supervision.  (Target Date 06/15/2015)   Baseline MET 06/13/2015   Time --   Period --   Status Achieved   PT SHORT TERM GOAL #4   Title Patient ambulates 200' with single point cane & prosthesis with supervision.  (Target Date 06/15/2015)   Baseline 06/15/15: pt able to ambulate the distance, still needing min guard to min assist for balance   Time --   Period --   Status Partially Met   PT SHORT TERM GOAL #5   Title Patient negotiates ramps, curbs & stairs (1 rail) with single point cane & prosthesis with  minimal assist.  (Target Date 06/15/2015)   Baseline met on 06/15/15   Time --   Period --   Status Achieved           PT Long Term Goals - 05/16/15 0930    PT LONG TERM GOAL #1   Title Patient demonstrates / verbalizes safe, independent prosthetic care to enable prosthesis use without issues.  (Target Date 06/15/2015)   Time 4   Period Weeks   Status New   PT LONG TERM GOAL #2   Title Patient tolerates wear of prosthesis >90% of awake hours without skin issues or limb tenderness to enable function throughout her day.  (Target Date 06/15/2015)   Time 4   Period Weeks   Status New   PT LONG TERM GOAL #3   Title [place] Balance >45/56 with prosthesis to reduce rall risk.  (Target Date 06/15/2015)   Time 4   Period Weeks  Status New   PT LONG TERM GOAL #4   Title Timed Up & Go with prosthesis only <13.5 sec to indicate lower fall risk.  (Target Date 06/15/2015)   Time 4   Period Weeks   Status New   PT LONG TERM GOAL #5   Title Patient ambulates 100' around furniture carrying laundry basket with prosthesis only modified indepent.  (Target Date 06/15/2015)   Time 4   Period Weeks   Status New   Additional Long Term Goals   Additional Long Term Goals Yes   PT LONG TERM GOAL #6   Title Patient amulates 400' with single point cane outside surfaces including grass, ramps & curbs with prosthesis modified independent to enable community mobility.  (Target Date 06/15/2015)   Time 4   Period Weeks   Status New           Plan - 07/03/15 0809    Clinical Impression Statement Pt noted to have lateral lean of pylon with gait, possibly contributing to the IR of her prosthesis with gait. Appt set with prosthetist for Friday am here before pt's 8am appointment. Pt making steady progress toward goals with use of straight cane.   Pt will benefit from skilled therapeutic intervention in order to improve on the following deficits Abnormal gait;Decreased activity tolerance;Decreased balance;Decreased  endurance;Decreased skin integrity;Decreased range of motion;Decreased mobility;Decreased strength;Increased edema;Postural dysfunction;Prosthetic Dependency   Rehab Potential Good   PT Frequency 2x / week   PT Duration 8 weeks   PT Treatment/Interventions ADLs/Self Care Home Management;DME Instruction;Gait training;Stair training;Functional mobility training;Therapeutic activities;Therapeutic exercise;Balance training;Neuromuscular re-education;Patient/family education;Prosthetic Training   PT Next Visit Plan continue toward LTGs, gait with straight cane including barriers, balance.   PT Home Exercise Plan 06/06/2015 HEP at sink   Consulted and Agree with Plan of Care Patient;Family member/caregiver   Family Member Consulted grandson (adult)          G-Codes - Jul 03, 2015 1420    Functional Assessment Tool Used pt wearing 5 hours 2x day, independent with care/cleaning, cues still needed for sock ply      Problem List Patient Active Problem List   Diagnosis Date Noted  . Loss or death of child June 15, 2015  . Status post below knee amputation of left lower extremity ([place]) 01/31/2015  . Non-healing wound of lower extremity 01/29/2015  . Ulcer of toe of left foot ([place]) 12/27/2014  . Essential hypertension 08/22/2014    Willow Ora 06/21/2015, 11:43 AM  Willow Ora, PTA, [facility] [address] [place], [place] [address] [phone] 06/21/2015, 11:43 AM   Name: [name] MRN: [id number] Date of Birth: 08/10/1931

## 2015-06-21 ENCOUNTER — Encounter: Payer: Self-pay | Admitting: Physical Therapy

## 2015-06-21 NOTE — Therapy (Signed)
[facility] [address] [place], [name], [address] Phone: [phone]   Fax:  [phone]  Patient Details  Name: [name] MRN: [id number] Date of Birth: 06/08/1931 Referring Provider:  Alger Simons, MD  Encounter Date: 06/21/2015   Physical Therapy Progress Note  Dates of Reporting Period: 05/16/2015 to 06/20/2015  Objective Reports of Subjective Statement: Patient reports increased wear ~60% of awake hours now with minimal to no discomfort on residual limb.   Objective Measurements: Patient tolerates wear of prosthesis 10 hrs /day with existing wound healing and no tenderness present.   Goal Update:      PT Short Term Goals - 06/15/15 0808    PT SHORT TERM GOAL #1   Title Patient demonstrates understanding of prosthetic care with minimal cues required. (Target Date 06/15/2015)   Baseline MET 06/13/2015   Time --   Period --   Status Achieved   PT SHORT TERM GOAL #2   Title Patient tolerates wear of prosthesis >12hrs total /day with no negative changes to wound & no tenderness on limb.  (Target Date 06/15/2015)   Baseline Partially MET 06/13/2015  Patient tolerating wear up to 10 hrs total /day with no negative changes to wound but tenderness in limb that appears to be improving.    Time --   Period --   Status Partially Met   PT SHORT TERM GOAL #3   Title Patient reaches 10", rotates shoulders to look behind her and picks up objects from floor without UE support with supervision.  (Target Date 06/15/2015)   Baseline MET 06/13/2015   Time --   Period --   Status Achieved   PT SHORT TERM GOAL #4   Title Patient ambulates 200' with single point cane & prosthesis with supervision.  (Target Date 06/15/2015)   Baseline 06/15/15: pt able to ambulate the distance, still needing min guard to min assist for balance   Time --   Period --   Status Partially Met   PT SHORT TERM GOAL #5   Title Patient negotiates ramps, curbs  & stairs (1 rail) with single point cane & prosthesis with minimal assist.  (Target Date 06/15/2015)   Baseline met on 06/15/15   Time --   Period --   Status Achieved      Plan: Continue initial plan of care established at evaluation.   Reason Skilled Services are Required: Patient requires skilled instruction in progressive, safe use & function with prosthesis. Her balance & gait continue to place her at high fall risk.    Jamey Reas PT, DPT 06/21/2015, 12:38 PM  [facility] [address] [place], [name], [address] Phone: [phone]   Fax:  [phone]

## 2015-06-22 ENCOUNTER — Ambulatory Visit: Payer: Medicare Other | Admitting: Physical Therapy

## 2015-06-22 ENCOUNTER — Encounter: Payer: Self-pay | Admitting: Physical Therapy

## 2015-06-22 DIAGNOSIS — R29818 Other symptoms and signs involving the nervous system: Secondary | ICD-10-CM | POA: Diagnosis not present

## 2015-06-22 DIAGNOSIS — R2681 Unsteadiness on feet: Secondary | ICD-10-CM | POA: Diagnosis not present

## 2015-06-22 DIAGNOSIS — R269 Unspecified abnormalities of gait and mobility: Secondary | ICD-10-CM

## 2015-06-22 DIAGNOSIS — Z7409 Other reduced mobility: Secondary | ICD-10-CM

## 2015-06-22 DIAGNOSIS — R531 Weakness: Secondary | ICD-10-CM | POA: Diagnosis not present

## 2015-06-22 DIAGNOSIS — R2689 Other abnormalities of gait and mobility: Secondary | ICD-10-CM

## 2015-06-22 DIAGNOSIS — Z5189 Encounter for other specified aftercare: Secondary | ICD-10-CM | POA: Diagnosis not present

## 2015-06-22 DIAGNOSIS — Z4789 Encounter for other orthopedic aftercare: Secondary | ICD-10-CM

## 2015-06-23 NOTE — Therapy (Signed)
[facility] [address] [place], [name], [address] Phone: [phone]   Fax:  [phone]  Physical Therapy Treatment  Patient Details  Name: [name] MRN: [id number] Date of Birth: Apr 14, 1932 Referring Provider: Alger Simons, MD  Encounter Date: 06/22/2015      PT End of Session - 06/22/15 0827    Visit Number 11   Number of Visits 17   Date for PT Re-Evaluation 07/13/15   Authorization Type Medicare G-Code & progress report every 10 visits   PT Start Time 0800   PT Stop Time 0840   PT Time Calculation (min) 40 min   Equipment Utilized During Treatment Gait belt   Activity Tolerance Patient tolerated treatment well   Behavior During Therapy [facility] for tasks assessed/performed      Past Medical History  Diagnosis Date  . Hypertension   . PAD (peripheral artery disease) ([place])   . Arthritis   . Cancer ([place])     utertrine    Past Surgical History  Procedure Laterality Date  . Spine surgery    . Knee surgery Right 1984  . Peripheral vascular catheterization N/A 09/15/2014    Procedure: Abdominal Aortogram;  Surgeon: Elam Dutch, MD;  Location: [facility] INVASIVE CV LAB CUPID;  Service: Cardiovascular;  Laterality: N/A;  . Peripheral vascular catheterization Bilateral 09/15/2014    Procedure: Lower Extremity Angiography;  Surgeon: Elam Dutch, MD;  Location: [place];  Service: Cardiovascular;  Laterality: Bilateral;  . Abdominal hysterectomy      partial  . Amputation Left 01/29/2015    Procedure: Left  AMPUTATION BELOW KNEE;  Surgeon: Elam Dutch, MD;  Location: [place];  Service: Vascular;  Laterality: Left;    There were no vitals filed for this visit.  Visit Diagnosis:  Balance problems  Unsteadiness on feet  Impaired functional mobility and activity tolerance  Weakness generalized  Abnormality of gait  Encounter for prosthetic gait training      Subjective  Assessment - 06/22/15 0804    Subjective Saw Gerald Stabs yesterday and had some adjustments made to prosthesis. Reports no rotaton now, still sore a residual limb.   Patient is accompained by: Family member  grandson   Patient Stated Goals She wants to use prosthesis to resume active lifestyle (clean house including heavy duty) go outside to [employer] garden, yard work.    Currently in Pain? Yes   Pain Score 6    Pain Location Leg   Pain Orientation Left   Pain Descriptors / Indicators Sore;Tender   Pain Type Acute pain   Pain Onset 1 to 4 weeks ago   Pain Frequency Intermittent   Aggravating Factors  improper fit of prosthesis, weight bearing through prosthesis   Pain Relieving Factors removing prosthesis           [facility] Adult PT Treatment/Exercise - 06/22/15 0827    Ambulation/Gait   Ambulation/Gait Yes   Ambulation/Gait Assistance 4: Min guard;4: Min assist   Ambulation/Gait Assistance Details cues on posture, step length, weight shifting and cane placement with gait.   Ambulation Distance (Feet) 240 Feet   Assistive device Straight cane   Gait Pattern Step-through pattern;Decreased arm swing - left;Decreased step length - right;Decreased stance time - left;Decreased stride length;Decreased hip/knee flexion - left;Decreased weight shift to left;Right flexed knee in stance;Left flexed knee in stance;Antalgic;Abducted - left;Trunk flexed;Poor foot clearance - left   Ambulation Surface Level;Indoor   Stairs Yes   Stairs Assistance  4: Min guard   Stairs Assistance Details (indicate cue type and reason) cues on sequencing, hand advancement on rails, and cane placement   Stair Management Technique One rail Right;Two rails;Step to pattern;Forwards;With cane   Number of Stairs 4  x 2 reps   Ramp 4: Min assist  cane with rubber tip/prosthesis   Ramp Details (indicate cue type and reason) x 2 reps with cues on sequencing, prosthesis, and prosture.   Curb 4: Min assist  cane with rubber  tip/prosthesis   Curb Details (indicate cue type and reason) x 2 reps with cues on sequencing, stance position, and cane position   Prosthetics   Current prosthetic wear tolerance (days/week)  daily   Current prosthetic wear tolerance (#hours/day)  5-6 hours 2x day with drying midway during 5 hours, 1-2 hours break between   Residual limb condition  lateral wound appears to be healing. 2 areas appear suture working way out.    Donning Prosthesis Supervision   Doffing Prosthesis Independent             PT Short Term Goals - 06/15/15 4270    PT SHORT TERM GOAL #1   Title Patient demonstrates understanding of prosthetic care with minimal cues required. (Target Date 06/15/2015)   Baseline MET 06/13/2015   Time --   Period --   Status Achieved   PT SHORT TERM GOAL #2   Title Patient tolerates wear of prosthesis >12hrs total /day with no negative changes to wound & no tenderness on limb.  (Target Date 06/15/2015)   Baseline Partially MET 06/13/2015  Patient tolerating wear up to 10 hrs total /day with no negative changes to wound but tenderness in limb that appears to be improving.    Time --   Period --   Status Partially Met   PT SHORT TERM GOAL #3   Title Patient reaches 10", rotates shoulders to look behind her and picks up objects from floor without UE support with supervision.  (Target Date 06/15/2015)   Baseline MET 06/13/2015   Time --   Period --   Status Achieved   PT SHORT TERM GOAL #4   Title Patient ambulates 200' with single point cane & prosthesis with supervision.  (Target Date 06/15/2015)   Baseline 06/15/15: pt able to ambulate the distance, still needing min guard to min assist for balance   Time --   Period --   Status Partially Met   PT SHORT TERM GOAL #5   Title Patient negotiates ramps, curbs & stairs (1 rail) with single point cane & prosthesis with minimal assist.  (Target Date 06/15/2015)   Baseline met on 06/15/15   Time --   Period --   Status Achieved            PT Long Term Goals - 05/16/15 0930    PT LONG TERM GOAL #1   Title Patient demonstrates / verbalizes safe, independent prosthetic care to enable prosthesis use without issues.  (Target Date 06/15/2015)   Time 4   Period Weeks   Status New   PT LONG TERM GOAL #2   Title Patient tolerates wear of prosthesis >90% of awake hours without skin issues or limb tenderness to enable function throughout her day.  (Target Date 06/15/2015)   Time 4   Period Weeks   Status New   PT LONG TERM GOAL #3   Title Berg Balance >45/56 with prosthesis to reduce rall risk.  (Target Date 06/15/2015)   Time 4  Period Weeks   Status New   PT LONG TERM GOAL #4   Title Timed Up & Go with prosthesis only <13.5 sec to indicate lower fall risk.  (Target Date 06/15/2015)   Time 4   Period Weeks   Status New   PT LONG TERM GOAL #5   Title Patient ambulates 100' around furniture carrying laundry basket with prosthesis only modified indepent.  (Target Date 06/15/2015)   Time 4   Period Weeks   Status New   Additional Long Term Goals   Additional Long Term Goals Yes   PT LONG TERM GOAL #6   Title Patient amulates 400' with single point cane outside surfaces including grass, ramps & curbs with prosthesis modified independent to enable community mobility.  (Target Date 06/15/2015)   Time 4   Period Weeks   Status New            Plan - 06/22/15 0827    Clinical Impression Statement Pt with improved prosthetic alignment after seeing prosthetist yesterday, however still with soreness at tibial crest area. Pt continues to make steady progress toward goals with use of cane.   Pt will benefit from skilled therapeutic intervention in order to improve on the following deficits Abnormal gait;Decreased activity tolerance;Decreased balance;Decreased endurance;Decreased skin integrity;Decreased range of motion;Decreased mobility;Decreased strength;Increased edema;Postural dysfunction;Prosthetic Dependency   Rehab Potential Good    PT Frequency 2x / week   PT Duration 8 weeks   PT Treatment/Interventions ADLs/Self Care Home Management;DME Instruction;Gait training;Stair training;Functional mobility training;Therapeutic activities;Therapeutic exercise;Balance training;Neuromuscular re-education;Patient/family education;Prosthetic Training   PT Next Visit Plan continue toward LTGs, gait with straight cane including barriers, balance.   PT Home Exercise Plan 06/06/2015 HEP at sink   Consulted and Agree with Plan of Care Patient;Family member/caregiver   Family Member Consulted grandson (adult)        Problem List Patient Active Problem List   Diagnosis Date Noted  . Loss or death of child 06/12/2015  . Status post below knee amputation of left lower extremity ([place]) 01/31/2015  . Non-healing wound of lower extremity 01/29/2015  . Ulcer of toe of left foot ([place]) 12/27/2014  . Essential hypertension 08/22/2014    Willow Ora 06/23/2015, 9:35 AM  Willow Ora, PTA, [facility] [address] [name], [place] [address] [phone] 06/23/2015, 9:35 AM   Name: [name] MRN: [id number] Date of Birth: 1932-01-08

## 2015-06-27 ENCOUNTER — Ambulatory Visit: Payer: Medicare Other | Admitting: Physical Therapy

## 2015-06-27 ENCOUNTER — Encounter: Payer: Self-pay | Admitting: Physical Therapy

## 2015-06-27 DIAGNOSIS — R269 Unspecified abnormalities of gait and mobility: Secondary | ICD-10-CM | POA: Diagnosis not present

## 2015-06-27 DIAGNOSIS — Z4789 Encounter for other orthopedic aftercare: Secondary | ICD-10-CM

## 2015-06-27 DIAGNOSIS — Z89512 Acquired absence of left leg below knee: Secondary | ICD-10-CM

## 2015-06-27 DIAGNOSIS — R531 Weakness: Secondary | ICD-10-CM | POA: Diagnosis not present

## 2015-06-27 DIAGNOSIS — R2681 Unsteadiness on feet: Secondary | ICD-10-CM

## 2015-06-27 DIAGNOSIS — Z7409 Other reduced mobility: Secondary | ICD-10-CM | POA: Diagnosis not present

## 2015-06-27 DIAGNOSIS — R29818 Other symptoms and signs involving the nervous system: Secondary | ICD-10-CM | POA: Diagnosis not present

## 2015-06-27 DIAGNOSIS — Z5189 Encounter for other specified aftercare: Secondary | ICD-10-CM | POA: Diagnosis not present

## 2015-06-27 DIAGNOSIS — R2689 Other abnormalities of gait and mobility: Secondary | ICD-10-CM

## 2015-06-27 NOTE — Therapy (Signed)
[facility] [address] [place], [name], [address] Phone: [phone]   Fax:  [phone]  Physical Therapy Treatment  Patient Details  Name: [name] MRN: [id number] Date of Birth: 07-16-1931 Referring Provider: Alger Simons, MD  Encounter Date: 06/27/2015      PT End of Session - 06/27/15 0845    Visit Number 12   Number of Visits 17   Date for PT Re-Evaluation 07/13/15   Authorization Type Medicare G-Code & progress report every 10 visits   PT Start Time 0800   PT Stop Time 0845   PT Time Calculation (min) 45 min   Equipment Utilized During Treatment Gait belt   Activity Tolerance Patient tolerated treatment well   Behavior During Therapy [facility] for tasks assessed/performed      Past Medical History  Diagnosis Date  . Hypertension   . PAD (peripheral artery disease) ([place])   . Arthritis   . Cancer ([place])     utertrine    Past Surgical History  Procedure Laterality Date  . Spine surgery    . Knee surgery Right 1984  . Peripheral vascular catheterization N/A 09/15/2014    Procedure: Abdominal Aortogram;  Surgeon: Elam Dutch, MD;  Location: [facility] INVASIVE CV LAB CUPID;  Service: Cardiovascular;  Laterality: N/A;  . Peripheral vascular catheterization Bilateral 09/15/2014    Procedure: Lower Extremity Angiography;  Surgeon: Elam Dutch, MD;  Location: [place];  Service: Cardiovascular;  Laterality: Bilateral;  . Abdominal hysterectomy      partial  . Amputation Left 01/29/2015    Procedure: Left  AMPUTATION BELOW KNEE;  Surgeon: Elam Dutch, MD;  Location: [facility];  Service: Vascular;  Laterality: Left;    There were no vitals filed for this visit.  Visit Diagnosis:  Balance problems  Unsteadiness on feet  Impaired functional mobility and activity tolerance  Weakness generalized  Abnormality of gait  Encounter for prosthetic gait training  Status post below  knee amputation of left lower extremity ([facility])      Subjective Assessment - 06/27/15 0800    Subjective Prosthesis has been feeling good since last adjustments.    Currently in Pain? No/denies                         [facility] Adult PT Treatment/Exercise - 06/27/15 0800    Transfers   Transfers Floor to Transfer   Sit to Stand 5: Supervision;With upper extremity assist;From chair/3-in-1   Stand to Sit 5: Supervision;With upper extremity assist;To chair/3-in-1   Floor to Transfer 5: Supervision;With upper extremity assist  pushing on mat table   Floor to Transfer Details (indicate cue type and reason) PT demo technique. Patient attempted with RLE & LLE as lead limb but prosthetic side knee would not flex to 90* so unable to get to ground.    Ambulation/Gait   Ambulation/Gait Yes   Ambulation/Gait Assistance 4: Min guard;4: Min assist   Ambulation/Gait Assistance Details cues on initial contact & step length   Ambulation Distance (Feet) 300 Feet  300' X 2   Assistive device Straight cane  tripod tip   Gait Pattern Step-through pattern;Decreased arm swing - left;Decreased step length - right;Decreased stance time - left;Decreased stride length;Decreased hip/knee flexion - left;Decreased weight shift to left;Right flexed knee in stance;Left flexed knee in stance;Antalgic;Abducted - left;Trunk flexed;Poor foot clearance - left   Ambulation Surface Indoor;Level;Outdoor;Paved   Stairs Yes  Stairs Assistance 5: Supervision   Stairs Assistance Details (indicate cue type and reason) cues on cane use / location   Stair Management Technique One rail Right;Step to pattern;Forwards;With cane;One rail Left  varied rail location   Number of Stairs 4  x 3 reps   Ramp 4: Min assist  cane with tripod tip & prosthesis   Ramp Details (indicate cue type and reason) cues on technique   Curb 4: Min assist  cane with tripod tip & prosthesis   Curb Details (indicate cue type and reason)  cues on technique   Prosthetics   Prosthetic Care Comments  PT instructed in signs of sweating   Current prosthetic wear tolerance (days/week)  daily   Current prosthetic wear tolerance (#hours/day)  increase to all awake hours drying q4hrs or prn.    Residual limb condition  no open areas but 3 small areas appear with possible suture working out   Education Provided Skin check;Residual limb care;Proper wear schedule/adjustment   Person(s) Educated Patient;Child(ren)   Education Method Explanation;Verbal cues   Education Method Verbalized understanding;Verbal cues required   Donning Prosthesis Modified independent (device/increased time)   Doffing Prosthesis Modified independent (device/increased time)                  PT Short Term Goals - 06/15/15 2993    PT SHORT TERM GOAL #1   Title Patient demonstrates understanding of prosthetic care with minimal cues required. (Target Date 06/15/2015)   Baseline MET 06/13/2015   Time --   Period --   Status Achieved   PT SHORT TERM GOAL #2   Title Patient tolerates wear of prosthesis >12hrs total /day with no negative changes to wound & no tenderness on limb.  (Target Date 06/15/2015)   Baseline Partially MET 06/13/2015  Patient tolerating wear up to 10 hrs total /day with no negative changes to wound but tenderness in limb that appears to be improving.    Time --   Period --   Status Partially Met   PT SHORT TERM GOAL #3   Title Patient reaches 10", rotates shoulders to look behind her and picks up objects from floor without UE support with supervision.  (Target Date 06/15/2015)   Baseline MET 06/13/2015   Time --   Period --   Status Achieved   PT SHORT TERM GOAL #4   Title Patient ambulates 200' with single point cane & prosthesis with supervision.  (Target Date 06/15/2015)   Baseline 06/15/15: pt able to ambulate the distance, still needing min guard to min assist for balance   Time --   Period --   Status Partially Met   PT SHORT TERM  GOAL #5   Title Patient negotiates ramps, curbs & stairs (1 rail) with single point cane & prosthesis with minimal assist.  (Target Date 06/15/2015)   Baseline met on 06/15/15   Time --   Period --   Status Achieved           PT Long Term Goals - 05/16/15 0930    PT LONG TERM GOAL #1   Title Patient demonstrates / verbalizes safe, independent prosthetic care to enable prosthesis use without issues.  (Target Date 06/15/2015)   Time 4   Period Weeks   Status New   PT LONG TERM GOAL #2   Title Patient tolerates wear of prosthesis >90% of awake hours without skin issues or limb tenderness to enable function throughout her day.  (Target Date 06/15/2015)  Time 4   Period Weeks   Status New   PT LONG TERM GOAL #3   Title Berg Balance >45/56 with prosthesis to reduce rall risk.  (Target Date 06/15/2015)   Time 4   Period Weeks   Status New   PT LONG TERM GOAL #4   Title Timed Up & Go with prosthesis only <13.5 sec to indicate lower fall risk.  (Target Date 06/15/2015)   Time 4   Period Weeks   Status New   PT LONG TERM GOAL #5   Title Patient ambulates 100' around furniture carrying laundry basket with prosthesis only modified indepent.  (Target Date 06/15/2015)   Time 4   Period Weeks   Status New   Additional Long Term Goals   Additional Long Term Goals Yes   PT LONG TERM GOAL #6   Title Patient amulates 400' with single point cane outside surfaces including grass, ramps & curbs with prosthesis modified independent to enable community mobility.  (Target Date 06/15/2015)   Time 4   Period Weeks   Status New               Plan - 06/27/15 0845    Clinical Impression Statement Patient improved prosthetic gait with cane with instruction in proper step length with initial contact with heel. Patient was limited in floor transfer by amount of knee flexion on prosthetic side.    Pt will benefit from skilled therapeutic intervention in order to improve on the following deficits Abnormal  gait;Decreased activity tolerance;Decreased balance;Decreased endurance;Decreased skin integrity;Decreased range of motion;Decreased mobility;Decreased strength;Increased edema;Postural dysfunction;Prosthetic Dependency   Rehab Potential Good   PT Frequency 2x / week   PT Duration 8 weeks   PT Treatment/Interventions ADLs/Self Care Home Management;DME Instruction;Gait training;Stair training;Functional mobility training;Therapeutic activities;Therapeutic exercise;Balance training;Neuromuscular re-education;Patient/family education;Prosthetic Training   PT Next Visit Plan continue toward LTGs, gait with straight cane including barriers, balance.   PT Home Exercise Plan 06/06/2015 HEP at sink   Consulted and Agree with Plan of Care Patient;Family member/caregiver   Family Member Consulted grandson (adult)        Problem List Patient Active Problem List   Diagnosis Date Noted  . Loss or death of child 02-Jul-2015  . Status post below knee amputation of left lower extremity ([place]) 01/31/2015  . Non-healing wound of lower extremity 01/29/2015  . Ulcer of toe of left foot ([place]) 12/27/2014  . Essential hypertension 08/22/2014    Jamey Reas PT, DPT 06/27/2015, 1:45 PM  [facility] [address] [place], [name], [address] Phone: [phone]   Fax:  [phone]  Name: [name] MRN: [id number] Date of Birth: 06/17/1931

## 2015-06-29 ENCOUNTER — Encounter: Payer: Self-pay | Admitting: Physical Therapy

## 2015-06-29 ENCOUNTER — Ambulatory Visit: Payer: Medicare Other | Admitting: Physical Therapy

## 2015-06-29 DIAGNOSIS — R2681 Unsteadiness on feet: Secondary | ICD-10-CM | POA: Diagnosis not present

## 2015-06-29 DIAGNOSIS — R531 Weakness: Secondary | ICD-10-CM

## 2015-06-29 DIAGNOSIS — Z7409 Other reduced mobility: Secondary | ICD-10-CM

## 2015-06-29 DIAGNOSIS — R269 Unspecified abnormalities of gait and mobility: Secondary | ICD-10-CM | POA: Diagnosis not present

## 2015-06-29 DIAGNOSIS — R2689 Other abnormalities of gait and mobility: Secondary | ICD-10-CM

## 2015-06-29 DIAGNOSIS — R29818 Other symptoms and signs involving the nervous system: Secondary | ICD-10-CM | POA: Diagnosis not present

## 2015-06-29 DIAGNOSIS — Z4789 Encounter for other orthopedic aftercare: Secondary | ICD-10-CM

## 2015-06-29 DIAGNOSIS — Z5189 Encounter for other specified aftercare: Secondary | ICD-10-CM | POA: Diagnosis not present

## 2015-06-29 NOTE — Therapy (Signed)
[name] [address] [place], [name], [address] Phone: [phone]   Fax:  [phone]  Physical Therapy Treatment  Patient Details  Name: [name] MRN: [id number] Date of Birth: 1931-11-30 Referring Provider: Alger Simons, MD  Encounter Date: 06/29/2015      PT End of Session - 06/29/15 0806    Visit Number 13   Number of Visits 17   Date for PT Re-Evaluation 07/13/15   Authorization Type Medicare G-Code & progress report every 10 visits   PT Start Time 0800   PT Stop Time 0845   PT Time Calculation (min) 45 min   Equipment Utilized During Treatment Gait belt   Activity Tolerance Patient tolerated treatment well   Behavior During Therapy [facility] for tasks assessed/performed      Past Medical History  Diagnosis Date  . Hypertension   . PAD (peripheral artery disease) ([place])   . Arthritis   . Cancer ([place])     utertrine    Past Surgical History  Procedure Laterality Date  . Spine surgery    . Knee surgery Right 1984  . Peripheral vascular catheterization N/A 09/15/2014    Procedure: Abdominal Aortogram;  Surgeon: Elam Dutch, MD;  Location: [facility] INVASIVE CV LAB CUPID;  Service: Cardiovascular;  Laterality: N/A;  . Peripheral vascular catheterization Bilateral 09/15/2014    Procedure: Lower Extremity Angiography;  Surgeon: Elam Dutch, MD;  Location: [facility];  Service: Cardiovascular;  Laterality: Bilateral;  . Abdominal hysterectomy      partial  . Amputation Left 01/29/2015    Procedure: Left  AMPUTATION BELOW KNEE;  Surgeon: Elam Dutch, MD;  Location: [facility];  Service: Vascular;  Laterality: Left;    There were no vitals filed for this visit.  Visit Diagnosis:  Balance problems  Unsteadiness on feet  Impaired functional mobility and activity tolerance  Weakness generalized  Abnormality of gait  Encounter for prosthetic gait training      Subjective  Assessment - 06/29/15 0805    Subjective No new complaints.    Patient is accompained by: Family member  grandson   Patient Stated Goals She wants to use prosthesis to resume active lifestyle (clean house including heavy duty) go outside to [employer] garden, yard work.    Currently in Pain? No/denies   Pain Score 0-No pain           [facility] Adult PT Treatment/Exercise - 06/29/15 0806    Transfers   Transfers Sit to Stand;Stand to Sit   Sit to Stand 5: Supervision;With upper extremity assist;From chair/3-in-1   Stand to Sit 5: Supervision;With upper extremity assist;To chair/3-in-1   Ambulation/Gait   Ambulation/Gait Yes   Ambulation/Gait Assistance 4: Min guard;5: Supervision   Ambulation/Gait Assistance Details cues on posture, step lenght, decreased abduction on prosthtic side and for sequence with cane   Ambulation Distance (Feet) 120 Feet  x2, 220 x2   Assistive device Straight cane  with tripod tip   Gait Pattern Step-through pattern;Decreased arm swing - left;Decreased step length - right;Decreased stance time - left;Decreased stride length;Decreased hip/knee flexion - left;Decreased weight shift to left;Right flexed knee in stance;Left flexed knee in stance;Antalgic;Abducted - left;Trunk flexed;Poor foot clearance - left   Ambulation Surface Level;Indoor   Stairs Yes   Stairs Assistance 5: Supervision   Stairs Assistance Details (indicate cue type and reason) cues on sequencing with cane on stairs   Stair Management Technique One rail Right;One rail Left;Step  to pattern;Forwards;With cane   Number of Stairs 4  x2 reps   Ramp 4: Min assist  with cane/prosthesis   Ramp Details (indicate cue type and reason) cues on posture and sequence   Curb 4: Min assist  wiht cane/prosthesis   Curb Details (indicate cue type and reason) cues on posture and stance position for increased balance after descending and ascending   High Level Balance   High Level Balance Activities Negotiating  over obstacles   High Level Balance Comments 4 bolsters: stepping over with cane/prosthesis x 4 laps with cues on proper sequencing               Prosthetics   Current prosthetic wear tolerance (days/week)  daily   Current prosthetic wear tolerance (#hours/day)  wearing all awake hours, drying q4 hours/prn   Residual limb condition  no open areas but 3 small areas appear with possible suture working out   Education Provided Correct ply sock adjustment;Proper wear schedule/adjustment;Proper weight-bearing schedule/adjustment   Person(s) Educated Patient;Child(ren)   Education Method Explanation;Verbal cues   Education Method Verbalized understanding   Donning Prosthesis Modified independent (device/increased time)   Doffing Prosthesis Modified independent (device/increased time)             PT Short Term Goals - 06/15/15 0808    PT SHORT TERM GOAL #1   Title Patient demonstrates understanding of prosthetic care with minimal cues required. (Target Date 06/15/2015)   Baseline MET 06/13/2015   Time --   Period --   Status Achieved   PT SHORT TERM GOAL #2   Title Patient tolerates wear of prosthesis >12hrs total /day with no negative changes to wound & no tenderness on limb.  (Target Date 06/15/2015)   Baseline Partially MET 06/13/2015  Patient tolerating wear up to 10 hrs total /day with no negative changes to wound but tenderness in limb that appears to be improving.    Time --   Period --   Status Partially Met   PT SHORT TERM GOAL #3   Title Patient reaches 10", rotates shoulders to look behind her and picks up objects from floor without UE support with supervision.  (Target Date 06/15/2015)   Baseline MET 06/13/2015   Time --   Period --   Status Achieved   PT SHORT TERM GOAL #4   Title Patient ambulates 200' with single point cane & prosthesis with supervision.  (Target Date 06/15/2015)   Baseline 06/15/15: pt able to ambulate the distance, still needing min guard to min assist for balance    Time --   Period --   Status Partially Met   PT SHORT TERM GOAL #5   Title Patient negotiates ramps, curbs & stairs (1 rail) with single point cane & prosthesis with minimal assist.  (Target Date 06/15/2015)   Baseline met on 06/15/15   Time --   Period --   Status Achieved           PT Long Term Goals - 05/16/15 0930    PT LONG TERM GOAL #1   Title Patient demonstrates / verbalizes safe, independent prosthetic care to enable prosthesis use without issues.  (Target Date 06/15/2015)   Time 4   Period Weeks   Status New   PT LONG TERM GOAL #2   Title Patient tolerates wear of prosthesis >90% of awake hours without skin issues or limb tenderness to enable function throughout her day.  (Target Date 06/15/2015)   Time 4   Period   Weeks   Status New   PT LONG TERM GOAL #3   Title Berg Balance >45/56 with prosthesis to reduce rall risk.  (Target Date 06/15/2015)   Time 4   Period Weeks   Status New   PT LONG TERM GOAL #4   Title Timed Up & Go with prosthesis only <13.5 sec to indicate lower fall risk.  (Target Date 06/15/2015)   Time 4   Period Weeks   Status New   PT LONG TERM GOAL #5   Title Patient ambulates 100' around furniture carrying laundry basket with prosthesis only modified indepent.  (Target Date 06/15/2015)   Time 4   Period Weeks   Status New   Additional Long Term Goals   Additional Long Term Goals Yes   PT LONG TERM GOAL #6   Title Patient amulates 400' with single point cane outside surfaces including grass, ramps & curbs with prosthesis modified independent to enable community mobility.  (Target Date 06/15/2015)   Time 4   Period Weeks   Status New            Plan - 06/29/15 0806    Clinical Impression Statement Skilled session focused on prosthetic gait with straight cane with tripod tip. Pt contiues to progress with cane use as demo'd with increased overall activity this session. Pt is making steady progress toward goals.   Pt will benefit from skilled  therapeutic intervention in order to improve on the following deficits Abnormal gait;Decreased activity tolerance;Decreased balance;Decreased endurance;Decreased skin integrity;Decreased range of motion;Decreased mobility;Decreased strength;Increased edema;Postural dysfunction;Prosthetic Dependency   Rehab Potential Good   PT Frequency 2x / week   PT Duration 8 weeks   PT Treatment/Interventions ADLs/Self Care Home Management;DME Instruction;Gait training;Stair training;Functional mobility training;Therapeutic activities;Therapeutic exercise;Balance training;Neuromuscular re-education;Patient/family education;Prosthetic Training   PT Next Visit Plan continue toward LTGs, gait with straight cane including barriers, balance.   PT Home Exercise Plan 06/06/2015 HEP at sink   Consulted and Agree with Plan of Care Patient;Family member/caregiver   Family Member Consulted grandson (adult)        Problem List Patient Active Problem List   Diagnosis Date Noted  . Loss or death of child 06-25-2015  . Status post below knee amputation of left lower extremity ([place]) 01/31/2015  . Non-healing wound of lower extremity 01/29/2015  . Ulcer of toe of left foot ([place]) 12/27/2014  . Essential hypertension 08/22/2014    Willow Ora 06/29/2015, 11:35 AM  Willow Ora, PTA, [facility] [address], [place], [place] [address] [phone] 06/29/2015, 11:35 AM   Name: [name] MRN: [id number] Date of Birth: July 05, 1931

## 2015-07-04 ENCOUNTER — Encounter: Payer: Self-pay | Admitting: Physical Therapy

## 2015-07-04 ENCOUNTER — Ambulatory Visit: Payer: Medicare Other | Admitting: Physical Therapy

## 2015-07-04 DIAGNOSIS — Z7409 Other reduced mobility: Secondary | ICD-10-CM

## 2015-07-04 DIAGNOSIS — R531 Weakness: Secondary | ICD-10-CM

## 2015-07-04 DIAGNOSIS — R269 Unspecified abnormalities of gait and mobility: Secondary | ICD-10-CM

## 2015-07-04 DIAGNOSIS — R2681 Unsteadiness on feet: Secondary | ICD-10-CM | POA: Diagnosis not present

## 2015-07-04 DIAGNOSIS — Z89512 Acquired absence of left leg below knee: Secondary | ICD-10-CM

## 2015-07-04 DIAGNOSIS — R29818 Other symptoms and signs involving the nervous system: Secondary | ICD-10-CM | POA: Diagnosis not present

## 2015-07-04 DIAGNOSIS — R2689 Other abnormalities of gait and mobility: Secondary | ICD-10-CM

## 2015-07-04 DIAGNOSIS — Z5189 Encounter for other specified aftercare: Secondary | ICD-10-CM | POA: Diagnosis not present

## 2015-07-04 DIAGNOSIS — Z4789 Encounter for other orthopedic aftercare: Secondary | ICD-10-CM

## 2015-07-05 NOTE — Therapy (Signed)
[facility] [address] [place], [name], [address] Phone: [phone]   Fax:  [phone]  Physical Therapy Treatment  Patient Details  Name: [name] MRN: [id number] Date of Birth: Apr 07, 1932 Referring Provider: Alger Simons, MD  Encounter Date: 07/04/2015      PT End of Session - 07/04/15 0845    Visit Number 14   Number of Visits 17   Date for PT Re-Evaluation 07/13/15   Authorization Type Medicare G-Code & progress report every 10 visits   PT Start Time 0802   PT Stop Time 0844   PT Time Calculation (min) 42 min   Equipment Utilized During Treatment Gait belt   Activity Tolerance Patient tolerated treatment well   Behavior During Therapy [facility] for tasks assessed/performed      Past Medical History  Diagnosis Date  . Hypertension   . PAD (peripheral artery disease) ([place])   . Arthritis   . Cancer ([place])     utertrine    Past Surgical History  Procedure Laterality Date  . Spine surgery    . Knee surgery Right 1984  . Peripheral vascular catheterization N/A 09/15/2014    Procedure: Abdominal Aortogram;  Surgeon: Elam Dutch, MD;  Location: [facility] INVASIVE CV LAB CUPID;  Service: Cardiovascular;  Laterality: N/A;  . Peripheral vascular catheterization Bilateral 09/15/2014    Procedure: Lower Extremity Angiography;  Surgeon: Elam Dutch, MD;  Location: [facility];  Service: Cardiovascular;  Laterality: Bilateral;  . Abdominal hysterectomy      partial  . Amputation Left 01/29/2015    Procedure: Left  AMPUTATION BELOW KNEE;  Surgeon: Elam Dutch, MD;  Location: [facility];  Service: Vascular;  Laterality: Left;    There were no vitals filed for this visit.  Visit Diagnosis:  Balance problems  Unsteadiness on feet  Impaired functional mobility and activity tolerance  Weakness generalized  Abnormality of gait  Encounter for prosthetic gait training  Status post below  knee amputation of left lower extremity ([facility])      Subjective Assessment - 07/04/15 0800    Subjective Wearing prosthesis most of her awake hours without issues.    Patient is accompained by: Family member   Currently in Pain? No/denies                         [facility] Adult PT Treatment/Exercise - 07/04/15 0800    Transfers   Transfers Sit to Stand;Stand to Sit   Sit to Stand 5: Supervision;With upper extremity assist;From chair/3-in-1   Stand to Sit 5: Supervision;With upper extremity assist;To chair/3-in-1   Ambulation/Gait   Ambulation/Gait Yes   Ambulation/Gait Assistance 5: Supervision   Ambulation/Gait Assistance Details cues on step length, knee extension in terminal swing with initial contact with heel   Ambulation Distance (Feet) 230 Feet  230' X 2   Assistive device Prosthesis;Straight cane  with tripod tip   Gait Pattern Step-through pattern;Decreased arm swing - left;Decreased step length - right;Decreased stance time - left;Decreased stride length;Decreased hip/knee flexion - left;Decreased weight shift to left;Right flexed knee in stance;Left flexed knee in stance;Antalgic;Abducted - left;Trunk flexed;Poor foot clearance - left   Ambulation Surface Indoor;Level   Stairs Yes   Stairs Assistance 5: Supervision   Stair Management Technique One rail Right;One rail Left;Step to pattern;Forwards;With cane   Number of Stairs 4  x2 reps   Ramp 4: Min assist  with cane/prosthesis   Ramp  Details (indicate cue type and reason) cues on technique   Curb 4: Min assist  wiht cane/prosthesis   Curb Details (indicate cue type and reason) cues on technique   High Level Balance   High Level Balance Activities Negotiating over obstacles;Turns;Figure 8 turns  stepping in floor ladder to increase step length   High Level Balance Comments cues on adjusting step length, pelvic orientation & technique   Prosthetics   Prosthetic Care Comments  reviewed cut-off socks  including use under proximal liner to decrease heat /sweat skin issues   Current prosthetic wear tolerance (days/week)  daily   Current prosthetic wear tolerance (#hours/day)  wearing all awake hours, drying q4 hours/prn   Residual limb condition  PT removed dry skin, possible suture from medial incision with tweezers   Education Provided Correct ply sock adjustment;Proper wear schedule/adjustment;Proper weight-bearing schedule/adjustment;Other (comment);Proper Donning  changing shoes on prosthesis, aligning pin to pull tissue   Person(s) Educated Patient;Child(ren)   Education Method Explanation;Demonstration;Tactile cues;Verbal cues   Education Method Returned demonstration;Verbalized understanding;Tactile cues required;Verbal cues required;Needs further instruction   Donning Prosthesis Supervision  aligning pin to pull tissue forward to "pad" distal tibia                  PT Short Term Goals - 06/15/15 6962    PT SHORT TERM GOAL #1   Title Patient demonstrates understanding of prosthetic care with minimal cues required. (Target Date 06/15/2015)   Baseline MET 06/13/2015   Time --   Period --   Status Achieved   PT SHORT TERM GOAL #2   Title Patient tolerates wear of prosthesis >12hrs total /day with no negative changes to wound & no tenderness on limb.  (Target Date 06/15/2015)   Baseline Partially MET 06/13/2015  Patient tolerating wear up to 10 hrs total /day with no negative changes to wound but tenderness in limb that appears to be improving.    Time --   Period --   Status Partially Met   PT SHORT TERM GOAL #3   Title Patient reaches 10", rotates shoulders to look behind her and picks up objects from floor without UE support with supervision.  (Target Date 06/15/2015)   Baseline MET 06/13/2015   Time --   Period --   Status Achieved   PT SHORT TERM GOAL #4   Title Patient ambulates 200' with single point cane & prosthesis with supervision.  (Target Date 06/15/2015)   Baseline  06/15/15: pt able to ambulate the distance, still needing min guard to min assist for balance   Time --   Period --   Status Partially Met   PT SHORT TERM GOAL #5   Title Patient negotiates ramps, curbs & stairs (1 rail) with single point cane & prosthesis with minimal assist.  (Target Date 06/15/2015)   Baseline met on 06/15/15   Time --   Period --   Status Achieved           PT Long Term Goals - 07/04/15 0845    PT LONG TERM GOAL #1   Title Patient demonstrates / verbalizes safe, independent prosthetic care to enable prosthesis use without issues.  (Target Date 07/13/2015)   Time 4   Period Weeks   Status On-going   PT LONG TERM GOAL #2   Title Patient tolerates wear of prosthesis >90% of awake hours without skin issues or limb tenderness to enable function throughout her day.  (Target Date 07/13/2015)   Time 4  Period Weeks   Status On-going   PT LONG TERM GOAL #3   Title Berg Balance >45/56 with prosthesis to reduce rall risk.  (Target Date 07/13/2015)   Time 4   Period Weeks   Status On-going   PT LONG TERM GOAL #4   Title Timed Up & Go with prosthesis only <13.5 sec to indicate lower fall risk.  (Target Date 07/13/2015)   Time 4   Period Weeks   Status On-going   PT LONG TERM GOAL #5   Title Patient ambulates 100' around furniture carrying laundry basket with prosthesis only modified indepent.  (Target Date 07/13/2015)   Time 4   Period Weeks   Status On-going   PT LONG TERM GOAL #6   Title Patient amulates 400' with single point cane outside surfaces including grass, ramps & curbs with prosthesis modified independent to enable community mobility.  (Target Date 07/13/2015)   Time 4   Period Weeks   Status On-going               Plan - 07/04/15 0845    Clinical Impression Statement PT set-up appt with prosthetist next week to have hamstring reliefs on posterior socket increased to enable more knee flexion. Patient improved ability to negotiate obstacles with skilled  care. She is ambulating without limb discomfort now.    Pt will benefit from skilled therapeutic intervention in order to improve on the following deficits Abnormal gait;Decreased activity tolerance;Decreased balance;Decreased endurance;Decreased skin integrity;Decreased range of motion;Decreased mobility;Decreased strength;Increased edema;Postural dysfunction;Prosthetic Dependency   Rehab Potential Good   PT Frequency 2x / week   PT Duration 8 weeks   PT Treatment/Interventions ADLs/Self Care Home Management;DME Instruction;Gait training;Stair training;Functional mobility training;Therapeutic activities;Therapeutic exercise;Balance training;Neuromuscular re-education;Patient/family education;Prosthetic Training   PT Next Visit Plan continue toward LTGs, gait with straight cane including barriers, balance.   PT Home Exercise Plan 06/06/2015 HEP at sink   Consulted and Agree with Plan of Care Patient;Family member/caregiver   Family Member Consulted grandson (adult)        Problem List Patient Active Problem List   Diagnosis Date Noted  . Loss or death of child 06/10/15  . Status post below knee amputation of left lower extremity ([place]) 01/31/2015  . Non-healing wound of lower extremity 01/29/2015  . Ulcer of toe of left foot ([place]) 12/27/2014  . Essential hypertension 08/22/2014    Jamey Reas PT, DPT 07/05/2015, 9:16 AM  [facility] [address] [place], [name], [address] Phone: [phone]   Fax:  [phone]  Name: [name] MRN: [id number] Date of Birth: [birth year]

## 2015-07-06 ENCOUNTER — Ambulatory Visit: Payer: Medicare Other | Admitting: Physical Therapy

## 2015-07-06 ENCOUNTER — Encounter: Payer: Self-pay | Admitting: Physical Therapy

## 2015-07-06 DIAGNOSIS — R2681 Unsteadiness on feet: Secondary | ICD-10-CM | POA: Diagnosis not present

## 2015-07-06 DIAGNOSIS — R269 Unspecified abnormalities of gait and mobility: Secondary | ICD-10-CM

## 2015-07-06 DIAGNOSIS — Z5189 Encounter for other specified aftercare: Secondary | ICD-10-CM | POA: Diagnosis not present

## 2015-07-06 DIAGNOSIS — Z4789 Encounter for other orthopedic aftercare: Secondary | ICD-10-CM

## 2015-07-06 DIAGNOSIS — R29818 Other symptoms and signs involving the nervous system: Secondary | ICD-10-CM | POA: Diagnosis not present

## 2015-07-06 DIAGNOSIS — Z7409 Other reduced mobility: Secondary | ICD-10-CM

## 2015-07-06 DIAGNOSIS — R2689 Other abnormalities of gait and mobility: Secondary | ICD-10-CM

## 2015-07-06 DIAGNOSIS — R531 Weakness: Secondary | ICD-10-CM

## 2015-07-08 NOTE — Therapy (Signed)
[facility] [address] [place], [place], [address] Phone: [phone]   Fax:  [phone]  Physical Therapy Treatment  Patient Details  Name: [name] MRN: [id number] Date of Birth: Jul 16, 1931 Referring Provider: Faith Rogue, MD  Encounter Date: 07/06/2015   07/06/15 0808  PT Visits / Re-Eval  Visit Number 15  Number of Visits 17  Date for PT Re-Evaluation 07/13/15  Authorization  Authorization Type Medicare G-Code & progress report every 10 visits  PT Time Calculation  PT Start Time 0803  PT Stop Time 0845  PT Time Calculation (min) 42 min  PT - End of Session  Equipment Utilized During Treatment Gait belt  Activity Tolerance Patient tolerated treatment well  Behavior During Therapy [facility] for tasks assessed/performed     Past Medical History  Diagnosis Date  . Hypertension   . PAD (peripheral artery disease) (HCC)   . Arthritis   . Cancer (HCC)     utertrine    Past Surgical History  Procedure Laterality Date  . Spine surgery    . Knee surgery Right 1984  . Peripheral vascular catheterization N/A 09/15/2014    Procedure: Abdominal Aortogram;  Surgeon: Sherren Kerns, MD;  Location: [facility] INVASIVE CV LAB CUPID;  Service: Cardiovascular;  Laterality: N/A;  . Peripheral vascular catheterization Bilateral 09/15/2014    Procedure: Lower Extremity Angiography;  Surgeon: Sherren Kerns, MD;  Location: [facility] INVASIVE CV LAB CUPID;  Service: Cardiovascular;  Laterality: Bilateral;  . Abdominal hysterectomy      partial  . Amputation Left 01/29/2015    Procedure: Left  AMPUTATION BELOW KNEE;  Surgeon: Sherren Kerns, MD;  Location: [facility] OR;  Service: Vascular;  Laterality: Left;    There were no vitals filed for this visit.  Visit Diagnosis:  Balance problems  Unsteadiness on feet  Impaired functional mobility and activity tolerance  Weakness generalized  Abnormality of gait  Encounter for  prosthetic gait training   07/06/15 0807  Symptoms/Limitations  Subjective No new complaints. No falls to report.   Patient is accompained by: Family member (grandson)  Patient Stated Goals She wants to use prosthesis to resume active lifestyle (clean house including heavy duty) go outside to [employer] garden, yard work.   Pain Assessment  Currently in Pain? Yes  Pain Score 4  Pain Location Leg  Pain Orientation Left  Pain Descriptors / Indicators Sore;Tender  Pain Type Acute pain  Pain Onset In the past 7 days  Pain Frequency Intermittent  Aggravating Factors  removal of sutures from incision last session  Pain Relieving Factors rest, removing prosthesis     07/06/15 0825  Transfers  Sit to Stand 5: Supervision;With upper extremity assist;From chair/3-in-1  Stand to Sit 5: Supervision;With upper extremity assist;To chair/3-in-1  Ambulation/Gait  Ambulation/Gait Yes  Ambulation/Gait Assistance 5: Supervision  Ambulation/Gait Assistance Details cues on posture, base of support and cane placement with gait  Ambulation Distance (Feet) 120 Feet (x2)  Assistive device Prosthesis;Straight cane (tripod tip)  Gait Pattern Step-through pattern;Decreased arm swing - left;Decreased step length - right;Decreased stance time - left;Decreased stride length;Decreased hip/knee flexion - left;Decreased weight shift to left;Right flexed knee in stance;Left flexed knee in stance;Antalgic;Abducted - left;Trunk flexed;Poor foot clearance - left  Ambulation Surface Level;Indoor  Stairs Yes  Stairs Assistance 5: Supervision  Stairs Assistance Details (indicate cue type and reason) cues on sequencing with cane/rail combination and prosthetic foot placement with descending while taking nonprosthtetic foot down first.  Stair Management  Technique One rail Right;One rail Left;Step to pattern;Forwards;With cane  Number of Stairs 4 (x2)  Ramp 4: Min assist (with cane/prosthesis)  Ramp Details (indicate cue  type and reason) x 2 reps  Curb 4: Min assist (with cane/prosthesis)  Curb Details (indicate cue type and reason) x2 reps  High Level Balance  High Level Balance Activities Negotitating around obstacles;Figure 8 turns  High Level Balance Comments figure 8 around hoola hoops with cane/prosthesis x 3 laps with cues on step length and posture.                             Prosthetics  Prosthetic Care Comments  increased difficulty getting liner back on after removal to check skin due to pt having issues with pin alingment when trying to don liner with skin pulled forward for pressure relief as shown to do during previous sessoin                         Current prosthetic wear tolerance (days/week)  daily  Current prosthetic wear tolerance (#hours/day)  wearing all awake hours, drying q4 hours/prn  Residual limb condition  2 small areas that are possible areas of sutures working out, no open areas.  Education Provided Residual limb care;Correct ply sock adjustment;Proper Donning;Proper Doffing;Proper wear schedule/adjustment  Person(s) Educated Patient;Other (comment) (adult grandson)  Education Method Explanation;Demonstration;Verbal cues  Education Method Verbalized understanding;Verbal cues required  Donning Prosthesis 5  Doffing Prosthesis 6           PT Short Term Goals - 06/15/15 5573    PT SHORT TERM GOAL #1   Title Patient demonstrates understanding of prosthetic care with minimal cues required. (Target Date 06/15/2015)   Baseline MET 06/13/2015   Time --   Period --   Status Achieved   PT SHORT TERM GOAL #2   Title Patient tolerates wear of prosthesis >12hrs total /day with no negative changes to wound & no tenderness on limb.  (Target Date 06/15/2015)   Baseline Partially MET 06/13/2015  Patient tolerating wear up to 10 hrs total /day with no negative changes to wound but tenderness in limb that appears to be improving.    Time --   Period --   Status Partially Met   PT SHORT TERM  GOAL #3   Title Patient reaches 10", rotates shoulders to look behind her and picks up objects from floor without UE support with supervision.  (Target Date 06/15/2015)   Baseline MET 06/13/2015   Time --   Period --   Status Achieved   PT SHORT TERM GOAL #4   Title Patient ambulates 200' with single point cane & prosthesis with supervision.  (Target Date 06/15/2015)   Baseline 06/15/15: pt able to ambulate the distance, still needing min guard to min assist for balance   Time --   Period --   Status Partially Met   PT SHORT TERM GOAL #5   Title Patient negotiates ramps, curbs & stairs (1 rail) with single point cane & prosthesis with minimal assist.  (Target Date 06/15/2015)   Baseline met on 06/15/15   Time --   Period --   Status Achieved           PT Long Term Goals - 07/04/15 0845    PT LONG TERM GOAL #1   Title Patient demonstrates / verbalizes safe, independent prosthetic care to enable prosthesis use without issues.  (  Target Date 07/13/2015)   Time 4   Period Weeks   Status On-going   PT LONG TERM GOAL #2   Title Patient tolerates wear of prosthesis >90% of awake hours without skin issues or limb tenderness to enable function throughout her day.  (Target Date 07/13/2015)   Time 4   Period Weeks   Status On-going   PT LONG TERM GOAL #3   Title Oceanographer >45/56 with prosthesis to reduce rall risk.  (Target Date 07/13/2015)   Time 4   Period Weeks   Status On-going   PT LONG TERM GOAL #4   Title Timed Up & Go with prosthesis only <13.5 sec to indicate lower fall risk.  (Target Date 07/13/2015)   Time 4   Period Weeks   Status On-going   PT LONG TERM GOAL #5   Title Patient ambulates 100' around furniture carrying laundry basket with prosthesis only modified indepent.  (Target Date 07/13/2015)   Time 4   Period Weeks   Status On-going   PT LONG TERM GOAL #6   Title Patient amulates 400' with single point cane outside surfaces including grass, ramps & curbs with prosthesis modified  independent to enable community mobility.  (Target Date 07/13/2015)   Time 4   Period Weeks   Status On-going        07/06/15 0808  Plan  Clinical Impression Statement Pt going to see prosthetis this pm to have posterior socket issues addressed to allow for more knee flexion with prosthetic use. Continued to work on gait with straight cane/prosthesis today. Pt is making steady progress toward goals.  Pt will benefit from skilled therapeutic intervention in order to improve on the following deficits Abnormal gait;Decreased activity tolerance;Decreased balance;Decreased endurance;Decreased skin integrity;Decreased range of motion;Decreased mobility;Decreased strength;Increased edema;Postural dysfunction;Prosthetic Dependency  Rehab Potential Good  PT Frequency 2x / week  PT Duration 8 weeks  PT Treatment/Interventions ADLs/Self Care Home Management;DME Instruction;Gait training;Stair training;Functional mobility training;Therapeutic activities;Therapeutic exercise;Balance training;Neuromuscular re-education;Patient/family education;Prosthetic Training  PT Next Visit Plan continue toward LTGs, gait with straight cane including barriers, balance.  PT Home Exercise Plan 06/06/2015 HEP at sink  Consulted and Agree with Plan of Care Patient;Family member/caregiver  Family Member Consulted grandson (adult)            Problem List Patient Active Problem List   Diagnosis Date Noted  . Loss or death of child 07-01-15  . Status post below knee amputation of left lower extremity ([place]) 01/31/2015  . Non-healing wound of lower extremity 01/29/2015  . Ulcer of toe of left foot ([place]) 12/27/2014  . Essential hypertension 08/22/2014    Willow Ora 07/08/2015, 10:13 PM  Willow Ora, PTA, [place] [address], [place], [place] [address] [phone] 07/08/2015, 10:13 PM  Name: [name] MRN: [id number] Date of Birth: March 28, 1932

## 2015-07-09 ENCOUNTER — Ambulatory Visit: Payer: Medicare Other | Admitting: Physical Therapy

## 2015-07-09 ENCOUNTER — Encounter: Payer: Self-pay | Admitting: Physical Therapy

## 2015-07-09 DIAGNOSIS — R531 Weakness: Secondary | ICD-10-CM

## 2015-07-09 DIAGNOSIS — R2681 Unsteadiness on feet: Secondary | ICD-10-CM | POA: Diagnosis not present

## 2015-07-09 DIAGNOSIS — Z7409 Other reduced mobility: Secondary | ICD-10-CM

## 2015-07-09 DIAGNOSIS — R2689 Other abnormalities of gait and mobility: Secondary | ICD-10-CM

## 2015-07-09 DIAGNOSIS — R29818 Other symptoms and signs involving the nervous system: Secondary | ICD-10-CM | POA: Diagnosis not present

## 2015-07-09 DIAGNOSIS — R269 Unspecified abnormalities of gait and mobility: Secondary | ICD-10-CM

## 2015-07-09 DIAGNOSIS — Z5189 Encounter for other specified aftercare: Secondary | ICD-10-CM | POA: Diagnosis not present

## 2015-07-09 DIAGNOSIS — Z4789 Encounter for other orthopedic aftercare: Secondary | ICD-10-CM

## 2015-07-10 NOTE — Therapy (Signed)
[facility] [address] [place], [name], [address] Phone: [phone]   Fax:  [phone]  Physical Therapy Treatment  Patient Details  Name: [name] MRN: [id number] Date of Birth: 11/05/1931 Referring Provider: Alger Simons, MD  Encounter Date: 07/09/2015      PT End of Session - 07/09/15 1543    Visit Number 16   Number of Visits 17   Date for PT Re-Evaluation 07/13/15   Authorization Type Medicare G-Code & progress report every 10 visits   PT Start Time 1535   PT Stop Time 1615   PT Time Calculation (min) 40 min   Equipment Utilized During Treatment Gait belt   Activity Tolerance Patient tolerated treatment well   Behavior During Therapy [facility] for tasks assessed/performed      Past Medical History  Diagnosis Date  . Hypertension   . PAD (peripheral artery disease) ([place])   . Arthritis   . Cancer ([place])     utertrine    Past Surgical History  Procedure Laterality Date  . Spine surgery    . Knee surgery Right 1984  . Peripheral vascular catheterization N/A 09/15/2014    Procedure: Abdominal Aortogram;  Surgeon: Elam Dutch, MD;  Location: [facility] INVASIVE CV LAB CUPID;  Service: Cardiovascular;  Laterality: N/A;  . Peripheral vascular catheterization Bilateral 09/15/2014    Procedure: Lower Extremity Angiography;  Surgeon: Elam Dutch, MD;  Location: [place];  Service: Cardiovascular;  Laterality: Bilateral;  . Abdominal hysterectomy      partial  . Amputation Left 01/29/2015    Procedure: Left  AMPUTATION BELOW KNEE;  Surgeon: Elam Dutch, MD;  Location: [place];  Service: Vascular;  Laterality: Left;    There were no vitals filed for this visit.  Visit Diagnosis:  Balance problems  Unsteadiness on feet  Impaired functional mobility and activity tolerance  Weakness generalized  Abnormality of gait  Encounter for prosthetic gait training      Subjective  Assessment - 07/09/15 1538    Subjective Having increased limb pain today at tibial area. Watt Climes on Friday and he feels she is going into socket too far. Has on 10 ply socks. He did "something" to back of prosthesis, however pt reports she can not tell a differance, still can't bend her knee.                                 Patient is accompained by: Family member  grandson   Patient Stated Goals She wants to use prosthesis to resume active lifestyle (clean house including heavy duty) go outside to [employer] garden, yard work.    Currently in Pain? Yes   Pain Score 5    Pain Location Leg   Pain Orientation Left   Pain Descriptors / Indicators Sore;Tender;Aching            [facility] Adult PT Treatment/Exercise - 07/09/15 1546    Transfers   Sit to Stand 5: Supervision;With upper extremity assist;From chair/3-in-1   Sit to Stand Details Verbal cues for safe use of DME/AE;Verbal cues for technique   Stand to Sit 5: Supervision;With upper extremity assist;To chair/3-in-1   Stand to Sit Details (indicate cue type and reason) Verbal cues for safe use of DME/AE;Verbal cues for technique   Ambulation/Gait   Ambulation/Gait Yes   Ambulation/Gait Assistance 4: Min guard;5: Supervision   Ambulation/Gait Assistance Details  occasional cues on posture, step length (increase left step length) and for cane placement so not to "kick" it with advancement of her leg.   Ambulation Distance (Feet) 220 Feet  x2 reps; 110 x 2   Assistive device Prosthesis;Straight cane  tripod tip   Gait Pattern Step-through pattern;Decreased arm swing - left;Decreased step length - right;Decreased stance time - left;Decreased stride length;Decreased hip/knee flexion - left;Decreased weight shift to left;Right flexed knee in stance;Left flexed knee in stance;Antalgic;Abducted - left;Trunk flexed;Poor foot clearance - left   Ambulation Surface Level;Indoor   Stairs Yes   Stairs Assistance 5: Supervision   Stairs Assistance  Details (indicate cue type and reason) cues on proper sequencing with descending stairs, no cues needed with ascending stairs   Stair Management Technique One rail Left;Forwards;With cane;Step to pattern   Number of Stairs 4   Ramp 4: Min assist   Ramp Details (indicate cue type and reason) x 2 reps; cues on sequencing and posture   Curb 4: Min assist   Curb Details (indicate cue type and reason) x 2 reps; cues on sequencing, cane placement and posture   Prosthetics   Current prosthetic wear tolerance (days/week)  daily   Current prosthetic wear tolerance (#hours/day)  wearing all awake hours, drying q4 hours/prn   Residual limb condition  2 small areas that are possible areas of sutures working out, no open areas.   Education Provided Proper weight-bearing schedule/adjustment;Correct ply sock adjustment   Person(s) Educated Patient;Other (comment)  adult grandson   Education Method Explanation;Verbal cues   Education Method Verbalized understanding;Verbal cues required   Donning Prosthesis Supervision   Doffing Prosthesis Modified independent (device/increased time)             PT Short Term Goals - 06/15/15 0626    PT SHORT TERM GOAL #1   Title Patient demonstrates understanding of prosthetic care with minimal cues required. (Target Date 06/15/2015)   Baseline MET 06/13/2015   Time --   Period --   Status Achieved   PT SHORT TERM GOAL #2   Title Patient tolerates wear of prosthesis >12hrs total /day with no negative changes to wound & no tenderness on limb.  (Target Date 06/15/2015)   Baseline Partially MET 06/13/2015  Patient tolerating wear up to 10 hrs total /day with no negative changes to wound but tenderness in limb that appears to be improving.    Time --   Period --   Status Partially Met   PT SHORT TERM GOAL #3   Title Patient reaches 10", rotates shoulders to look behind her and picks up objects from floor without UE support with supervision.  (Target Date 06/15/2015)    Baseline MET 06/13/2015   Time --   Period --   Status Achieved   PT SHORT TERM GOAL #4   Title Patient ambulates 200' with single point cane & prosthesis with supervision.  (Target Date 06/15/2015)   Baseline 06/15/15: pt able to ambulate the distance, still needing min guard to min assist for balance   Time --   Period --   Status Partially Met   PT SHORT TERM GOAL #5   Title Patient negotiates ramps, curbs & stairs (1 rail) with single point cane & prosthesis with minimal assist.  (Target Date 06/15/2015)   Baseline met on 06/15/15   Time --   Period --   Status Achieved           PT Long Term Goals - 07/09/15 1545  PT LONG TERM GOAL #1   Title Patient demonstrates / verbalizes safe, independent prosthetic care to enable prosthesis use without issues.  (Target Date 07/13/2015)   Time 4   Period Weeks   Status On-going   PT LONG TERM GOAL #2   Title Patient tolerates wear of prosthesis >90% of awake hours without skin issues or limb tenderness to enable function throughout her day.  (Target Date 07/13/2015)   Time 4   Period Weeks   Status On-going   PT LONG TERM GOAL #3   Title Oceanographer >45/56 with prosthesis to reduce rall risk.  (Target Date 07/13/2015)   Time 4   Period Weeks   Status On-going   PT LONG TERM GOAL #4   Title Timed Up & Go with prosthesis only <13.5 sec to indicate lower fall risk.  (Target Date 07/13/2015)   Time 4   Period Weeks   Status On-going   PT LONG TERM GOAL #5   Title Patient ambulates 100' around furniture carrying laundry basket with prosthesis only modified indepent.  (Target Date 07/13/2015)   Time 4   Period Weeks   Status On-going   PT LONG TERM GOAL #6   Title Patient amulates 400' with single point cane outside surfaces including grass, ramps & curbs with prosthesis modified independent to enable community mobility.  (Target Date 07/13/2015)   Time 4   Period Weeks   Status On-going             Plan - 07/09/15 1545    Clinical  Impression Statement Pt was able to increase overall activity today with cane despite having increased pain today. Pt is making steady progress toward goals.   Pt will benefit from skilled therapeutic intervention in order to improve on the following deficits Abnormal gait;Decreased activity tolerance;Decreased balance;Decreased endurance;Decreased skin integrity;Decreased range of motion;Decreased mobility;Decreased strength;Increased edema;Postural dysfunction;Prosthetic Dependency   Rehab Potential Good   PT Frequency 2x / week   PT Duration 8 weeks   PT Treatment/Interventions ADLs/Self Care Home Management;DME Instruction;Gait training;Stair training;Functional mobility training;Therapeutic activities;Therapeutic exercise;Balance training;Neuromuscular re-education;Patient/family education;Prosthetic Training   PT Next Visit Plan check LTGs.   PT Home Exercise Plan 06/06/2015 HEP at sink   Consulted and Agree with Plan of Care Patient;Family member/caregiver   Family Member Consulted grandson (adult)        Problem List Patient Active Problem List   Diagnosis Date Noted  . Loss or death of child 06/19/2015  . Status post below knee amputation of left lower extremity ([place]) 01/31/2015  . Non-healing wound of lower extremity 01/29/2015  . Ulcer of toe of left foot ([place]) 12/27/2014  . Essential hypertension 08/22/2014    Willow Ora 07/10/2015, 8:53 PM  Willow Ora, PTA, [place] [address], [place], [place] [address] [phone] 07/10/2015, 8:53 PM   Name: [name] MRN: [id number] Date of Birth: 1931-05-23

## 2015-07-11 ENCOUNTER — Encounter: Payer: Self-pay | Admitting: Physical Therapy

## 2015-07-11 ENCOUNTER — Encounter: Payer: Medicare Other | Admitting: Physical Therapy

## 2015-07-11 ENCOUNTER — Ambulatory Visit: Payer: Medicare Other | Attending: Physical Medicine & Rehabilitation | Admitting: Physical Therapy

## 2015-07-11 DIAGNOSIS — R2681 Unsteadiness on feet: Secondary | ICD-10-CM

## 2015-07-11 DIAGNOSIS — R2689 Other abnormalities of gait and mobility: Secondary | ICD-10-CM

## 2015-07-11 DIAGNOSIS — I10 Essential (primary) hypertension: Secondary | ICD-10-CM | POA: Insufficient documentation

## 2015-07-11 DIAGNOSIS — M24669 Ankylosis, unspecified knee: Secondary | ICD-10-CM | POA: Insufficient documentation

## 2015-07-11 DIAGNOSIS — Z5189 Encounter for other specified aftercare: Secondary | ICD-10-CM | POA: Insufficient documentation

## 2015-07-11 DIAGNOSIS — Z4789 Encounter for other orthopedic aftercare: Secondary | ICD-10-CM

## 2015-07-11 DIAGNOSIS — Z7409 Other reduced mobility: Secondary | ICD-10-CM

## 2015-07-11 DIAGNOSIS — R29818 Other symptoms and signs involving the nervous system: Secondary | ICD-10-CM | POA: Diagnosis not present

## 2015-07-11 DIAGNOSIS — Z89512 Acquired absence of left leg below knee: Secondary | ICD-10-CM

## 2015-07-11 DIAGNOSIS — R269 Unspecified abnormalities of gait and mobility: Secondary | ICD-10-CM | POA: Diagnosis not present

## 2015-07-11 DIAGNOSIS — R531 Weakness: Secondary | ICD-10-CM | POA: Diagnosis not present

## 2015-07-11 NOTE — Therapy (Signed)
[facility] [address] , [place], [address] Phone: [phone]   Fax:  [phone]  Physical Therapy Treatment  Patient Details  Name: [name] MRN: [id number] Date of Birth: 01/31/1932 Referring Provider: Zachary Swartz, MD  Encounter Date: 07/11/2015      PT End of Session - 07/11/15 0856    Visit Number 17   Number of Visits 17   Date for PT Re-Evaluation 07/13/15   Authorization Type Medicare G-Code & progress report every 10 visits   PT Start Time 0848   PT Stop Time 0930   PT Time Calculation (min) 42 min   Equipment Utilized During Treatment Gait belt   Activity Tolerance Patient tolerated treatment well   Behavior During Therapy WFL for tasks assessed/performed      Past Medical History  Diagnosis Date  . Hypertension   . PAD (peripheral artery disease) (HCC)   . Arthritis   . Cancer (HCC)     utertrine    Past Surgical History  Procedure Laterality Date  . Spine surgery    . Knee surgery Right 1984  . Peripheral vascular catheterization N/A 09/15/2014    Procedure: Abdominal Aortogram;  Surgeon: Charles E Fields, MD;  Location: [facility] INVASIVE CV LAB [facility]UP[facility];  Service: Cardiovascular;  Laterality: N/A;  . Peripheral vascular catheterization Bilateral 09/15/2014    Procedure: Lower Extremity Angiography;  Surgeon: Charles E Fields, MD;  Location: [facility] INVASIVE CV LAB [facility]UP[facility];  Service: Cardiovascular;  Laterality: Bilateral;  . Abdominal hysterectomy      partial  . Amputation Left 01/29/2015    Procedure: Left  AMPUTATION BELOW KNEE;  Surgeon: Charles E Fields, MD;  Location: [facility] OR;  Service: Vascular;  Laterality: Left;    There were no vitals filed for this visit.  Visit Diagnosis:  Balance problems  Unsteadiness on feet  Impaired functional mobility and activity tolerance  Weakness generalized  Abnormality of gait  Encounter for prosthetic gait training  Status post below knee  amputation of left lower extremity (HCC)      Subjective Assessment - 07/11/15 0854    Subjective Having more pain than with the other day. Has up to 14 ply socks on and still having pressure on tibial bone area. Grandson checked limb placement and she was in socket well.    Patient is accompained by: Family member  grandson   Patient Stated Goals She wants to use prosthesis to resume active lifestyle (clean house including heavy duty) go outside to flower garden, yard work.    Currently in Pain? Yes   Pain Score 7    Pain Location Leg   Pain Orientation Left   Pain Descriptors / Indicators Sore;Aching;Tender   Pain Type Acute pain   Pain Onset 1 to 4 weeks ago   Pain Frequency Intermittent   Aggravating Factors  prosthesis wear   Pain Relieving Factors rest, removing prosthesis             [facility] Adult PT Treatment/Exercise - 07/11/15 0858    Transfers   Sit to Stand 5: Supervision;With upper extremity assist;From chair/3-in-1   Sit to Stand Details Verbal cues for safe use of DME/AE;Verbal cues for technique   Stand to Sit 5: Supervision;With upper extremity assist;To chair/3-in-1   Stand to Sit Details (indicate cue type and reason) Verbal cues for safe use of DME/AE;Verbal cues for technique   Ambulation/Gait   Ambulation/Gait Yes   Ambulation/Gait Assistance 4: Min guard   Ambulation   Distance (Feet) 120 Feet   Assistive device Prosthesis;Straight cane  tripod tip   Gait Pattern Step-through pattern;Decreased arm swing - left;Decreased step length - right;Decreased stance time - left;Decreased stride length;Decreased hip/knee flexion - left;Decreased weight shift to left;Right flexed knee in stance;Left flexed knee in stance;Antalgic;Abducted - left;Trunk flexed;Poor foot clearance - left   Ambulation Surface Level;Indoor   Berg Balance Test   Sit to Stand Able to stand without using hands and stabilize independently   Standing Unsupported Able to stand safely 2 minutes    Sitting with Back Unsupported but Feet Supported on Floor or Stool Able to sit safely and securely 2 minutes   Stand to Sit Sits safely with minimal use of hands   Transfers Able to transfer safely, definite need of hands   Standing Unsupported with Eyes Closed Able to stand 10 seconds safely   Standing Ubsupported with Feet Together Able to place feet together independently and stand for 1 minute with supervision   From Standing, Reach Forward with Outstretched Arm Can reach forward >12 cm safely (5")  8 inches   From Standing Position, Pick up Object from Floor Able to pick up shoe, needs supervision   From Standing Position, Turn to Look Behind Over each Shoulder Looks behind one side only/other side shows less weight shift  right > left   Turn 360 Degrees Able to turn 360 degrees safely but slowly  > 6 seconds each way   Standing Unsupported, Alternately Place Feet on Step/Stool Able to complete >2 steps/needs minimal assist   Standing Unsupported, One Foot in Front Able to take small step independently and hold 30 seconds   Standing on One Leg Tries to lift leg/unable to hold 3 seconds but remains standing independently   Total Score 41   Prosthetics   Current prosthetic wear tolerance (days/week)  daily   Current prosthetic wear tolerance (#hours/day)  wearing all awake hours, drying q4 hours/prn   Residual limb condition  2 small areas that are possible areas of sutures working out, no open areas.   Education Provided Correct ply sock adjustment;Proper wear schedule/adjustment;Proper weight-bearing schedule/adjustment;Residual limb care   Person(s) Educated Patient;Other (comment)  grandson   Education Method Explanation;Demonstration;Verbal cues   Education Method Verbalized understanding;Verbal cues required   Donning Prosthesis Supervision   Doffing Prosthesis Modified independent (device/increased time)            PT Short Term Goals - 06/15/15 0808    PT SHORT TERM  GOAL #1   Title Patient demonstrates understanding of prosthetic care with minimal cues required. (Target Date 06/15/2015)   Baseline MET 06/13/2015   Time --   Period --   Status Achieved   PT SHORT TERM GOAL #2   Title Patient tolerates wear of prosthesis >12hrs total /day with no negative changes to wound & no tenderness on limb.  (Target Date 06/15/2015)   Baseline Partially MET 06/13/2015  Patient tolerating wear up to 10 hrs total /day with no negative changes to wound but tenderness in limb that appears to be improving.    Time --   Period --   Status Partially Met   PT SHORT TERM GOAL #3   Title Patient reaches 10", rotates shoulders to look behind her and picks up objects from floor without UE support with supervision.  (Target Date 06/15/2015)   Baseline MET 06/13/2015   Time --   Period --   Status Achieved   PT SHORT TERM GOAL #4     Title Patient ambulates 200' with single point cane & prosthesis with supervision.  (Target Date 06/15/2015)   Baseline 06/15/15: pt able to ambulate the distance, still needing min guard to min assist for balance   Time --   Period --   Status Partially Met   PT SHORT TERM GOAL #5   Title Patient negotiates ramps, curbs & stairs (1 rail) with single point cane & prosthesis with minimal assist.  (Target Date 06/15/2015)   Baseline met on 06/15/15   Time --   Period --   Status Achieved           PT Long Term Goals - 07/09/15 1545    PT LONG TERM GOAL #1   Title Patient demonstrates / verbalizes safe, independent prosthetic care to enable prosthesis use without issues.  (Target Date 07/13/2015)   Time 4   Period Weeks   Status On-going   PT LONG TERM GOAL #2   Title Patient tolerates wear of prosthesis >90% of awake hours without skin issues or limb tenderness to enable function throughout her day.  (Target Date 07/13/2015)   Time 4   Period Weeks   Status On-going   PT LONG TERM GOAL #3   Title Berg Balance >45/56 with prosthesis to reduce rall risk.   (Target Date 07/13/2015)   Time 4   Period Weeks   Status On-going   PT LONG TERM GOAL #4   Title Timed Up & Go with prosthesis only <13.5 sec to indicate lower fall risk.  (Target Date 07/13/2015)   Time 4   Period Weeks   Status On-going   PT LONG TERM GOAL #5   Title Patient ambulates 100' around furniture carrying laundry basket with prosthesis only modified indepent.  (Target Date 07/13/2015)   Time 4   Period Weeks   Status On-going   PT LONG TERM GOAL #6   Title Patient amulates 400' with single point cane outside surfaces including grass, ramps & curbs with prosthesis modified independent to enable community mobility.  (Target Date 07/13/2015)   Time 4   Period Weeks   Status On-going           Plan - 07/11/15 0856    Clinical Impression Statement Pt continues to have increased pain at tibial area with prosthetic wear. Trialing increased amount of cut off socks with less full socks to decreased pressure on tibial area while decreased the bulkiness at her knee so she can bend her knee better. Pt reporting in session improvement in discomfort and in the bullkiness at her knee. Pt with improved Berg Balance Test score today. Pt has not met all LTG's, however has made good progress toward them.                                                   Pt will benefit from skilled therapeutic intervention in order to improve on the following deficits Abnormal gait;Decreased activity tolerance;Decreased balance;Decreased endurance;Decreased skin integrity;Decreased range of motion;Decreased mobility;Decreased strength;Increased edema;Postural dysfunction;Prosthetic Dependency   Rehab Potential Good   PT Frequency 2x / week   PT Duration 8 weeks   PT Treatment/Interventions ADLs/Self Care Home Management;DME Instruction;Gait training;Stair training;Functional mobility training;Therapeutic activities;Therapeutic exercise;Balance training;Neuromuscular re-education;Patient/family education;Prosthetic  Training   PT Next Visit Plan PT to renew;continue with cane for gait, including barriers.  add balance   activiites as well.   PT Home Exercise Plan 06/06/2015 HEP at sink   Consulted and Agree with Plan of Care Patient;Family member/caregiver   Family Member Consulted grandson (adult)        Problem List Patient Active Problem List   Diagnosis Date Noted  . Loss or death of child 06-13-15  . Status post below knee amputation of left lower extremity ([place]) 01/31/2015  . Non-healing wound of lower extremity 01/29/2015  . Ulcer of toe of left foot ([place]) 12/27/2014  . Essential hypertension 08/22/2014    Willow Ora 07/12/2015, 8:22 AM  Willow Ora, PTA, [facility] [address], [place], [place] [address] [phone] 07/12/2015, 8:24 AM   Name: CLORENE NERIO MRN: [id number] Date of Birth: [birth year]

## 2015-07-13 ENCOUNTER — Encounter: Payer: Medicare Other | Admitting: Physical Therapy

## 2015-07-16 ENCOUNTER — Encounter: Payer: Self-pay | Admitting: Physical Therapy

## 2015-07-16 ENCOUNTER — Ambulatory Visit: Payer: Medicare Other | Admitting: Physical Therapy

## 2015-07-16 DIAGNOSIS — R29818 Other symptoms and signs involving the nervous system: Secondary | ICD-10-CM | POA: Diagnosis not present

## 2015-07-16 DIAGNOSIS — R269 Unspecified abnormalities of gait and mobility: Secondary | ICD-10-CM | POA: Diagnosis not present

## 2015-07-16 DIAGNOSIS — R2681 Unsteadiness on feet: Secondary | ICD-10-CM

## 2015-07-16 DIAGNOSIS — R531 Weakness: Secondary | ICD-10-CM

## 2015-07-16 DIAGNOSIS — Z5189 Encounter for other specified aftercare: Secondary | ICD-10-CM | POA: Diagnosis not present

## 2015-07-16 DIAGNOSIS — Z7409 Other reduced mobility: Secondary | ICD-10-CM | POA: Diagnosis not present

## 2015-07-16 DIAGNOSIS — Z89512 Acquired absence of left leg below knee: Secondary | ICD-10-CM

## 2015-07-16 DIAGNOSIS — R2689 Other abnormalities of gait and mobility: Secondary | ICD-10-CM

## 2015-07-16 DIAGNOSIS — Z4789 Encounter for other orthopedic aftercare: Secondary | ICD-10-CM

## 2015-07-16 DIAGNOSIS — M24669 Ankylosis, unspecified knee: Secondary | ICD-10-CM

## 2015-07-16 NOTE — Therapy (Signed)
[facility] [address] [place], [name], [address] Phone: [phone]   Fax:  [phone]  Physical Therapy Treatment  Patient Details  Name: [name] MRN: [id number] Date of Birth: 1932-01-30 Referring Provider: Alger Simons, MD  Encounter Date: 07/16/2015      PT End of Session - 07/16/15 1110    Visit Number 18   Number of Visits 33   Date for PT Re-Evaluation 09/07/15   Authorization Type Medicare G-Code & progress report every 10 visits   PT Start Time 0803   PT Stop Time 0845   PT Time Calculation (min) 42 min   Equipment Utilized During Treatment Gait belt   Activity Tolerance Patient tolerated treatment well   Behavior During Therapy [facility] for tasks assessed/performed      Past Medical History  Diagnosis Date  . Hypertension   . PAD (peripheral artery disease) ([place])   . Arthritis   . Cancer ([place])     utertrine    Past Surgical History  Procedure Laterality Date  . Spine surgery    . Knee surgery Right 1984  . Peripheral vascular catheterization N/A 09/15/2014    Procedure: Abdominal Aortogram;  Surgeon: Elam Dutch, MD;  Location: [facility] INVASIVE CV LAB CUPID;  Service: Cardiovascular;  Laterality: N/A;  . Peripheral vascular catheterization Bilateral 09/15/2014    Procedure: Lower Extremity Angiography;  Surgeon: Elam Dutch, MD;  Location: [place];  Service: Cardiovascular;  Laterality: Bilateral;  . Abdominal hysterectomy      partial  . Amputation Left 01/29/2015    Procedure: Left  AMPUTATION BELOW KNEE;  Surgeon: Elam Dutch, MD;  Location: [place];  Service: Vascular;  Laterality: Left;    There were no vitals filed for this visit.  Visit Diagnosis:  Balance problems  Unsteadiness on feet  Impaired functional mobility and activity tolerance  Abnormality of gait  Encounter for prosthetic gait training      Subjective Assessment - 07/16/15 0806     Subjective Patient stated she is getting some relief from her sinus cold, which has been making her feel poorly. Patient states prosthesis is feeling all right. She states evenings are harder than mornings. Patient states right amount of socks prevents pain.   Patient is accompained by: Family member  grandson   Patient Stated Goals She wants to use prosthesis to resume active lifestyle (clean house including heavy duty) go outside to [employer] garden, yard work.    Currently in Pain? No/denies   Pain Onset 1 to 4 weeks ago          [facility] Adult PT Treatment/Exercise - 07/16/15 0954    Transfers   Sit to Stand 5: Supervision;With upper extremity assist;From chair/3-in-1   Sit to Stand Details Verbal cues for safe use of DME/AE;Verbal cues for technique   Stand to Sit 5: Supervision;With upper extremity assist;To chair/3-in-1   Stand to Sit Details (indicate cue type and reason) Verbal cues for safe use of DME/AE;Verbal cues for technique   Ambulation/Gait   Ambulation/Gait Yes   Ambulation/Gait Assistance 5: Supervision   Ambulation/Gait Assistance Details Verbal cues for increased step length and decreased external rotation RLE.   Ambulation Distance (Feet) 420 Feet   Assistive device Prosthesis;Straight cane   Gait Pattern Step-through pattern;Decreased arm swing - left;Decreased step length - right;Decreased stance time - left;Decreased stride length;Decreased hip/knee flexion - left;Decreased weight shift to left;Right flexed knee in stance;Left flexed knee in  stance;Antalgic;Abducted - left;Trunk flexed;Poor foot clearance - left   Ambulation Surface Indoor;Level   Neuro Re-ed    Neuro Re-ed Details  // Bars on even surface no UE support eyes closed no head movement, eyes closed head nod, eyes closed head shake, eyes closed diagonals; on Airex pad eyes closed no head movement, eyes closed head shake, eyes closed head nod, eyes closed diagonals.   Prosthetics   Prosthetic Care Comments   Patient has increased difficulty determining appropriate amount of socks to use. Needed to add socks twice within session.   Current prosthetic wear tolerance (days/week)  daily   Current prosthetic wear tolerance (#hours/day)  wearing all awake hours, drying q4 hours/prn   Residual limb condition  2 small areas that are possible areas of sutures working out, no open areas.  Small portion of a suture working out removed by supervising PTA.   Education Provided Skin check;Residual limb care;Correct ply sock adjustment   Person(s) Educated Patient;Child(ren)   Education Method Explanation;Verbal cues   Education Method Verbalized understanding;Returned demonstration;Verbal cues required   Donning Prosthesis Supervision   Doffing Prosthesis Modified independent (device/increased time)          PT Short Term Goals - 07/16/15 1220    PT SHORT TERM GOAL #1   Title Patient demonstrates understanding of prosthetic care with minimal cues required. (Target Date 06/15/2015)   Baseline MET 06/13/2015   Status Achieved   PT SHORT TERM GOAL #2   Title Patient tolerates wear of prosthesis >12hrs total /day with no negative changes to wound & no tenderness on limb.  (Target Date 06/15/2015)   Baseline Partially MET 06/13/2015  Patient tolerating wear up to 10 hrs total /day with no negative changes to wound but tenderness in limb that appears to be improving.    Status Partially Met   PT SHORT TERM GOAL #3   Title Patient reaches 10", rotates shoulders to look behind her and picks up objects from floor without UE support with supervision.  (Target Date 06/15/2015)   Baseline MET 06/13/2015   Status Achieved   PT SHORT TERM GOAL #4   Title Patient ambulates 200' with single point cane & prosthesis with supervision.  (Target Date 06/15/2015)   Baseline 06/15/15: pt able to ambulate the distance, still needing min guard to min assist for balance   Status Partially Met   PT SHORT TERM GOAL #5   Title Patient negotiates  ramps, curbs & stairs (1 rail) with single point cane & prosthesis with minimal assist.  (Target Date 06/15/2015)   Baseline met on 06/15/15   Status Achieved   Additional Short Term Goals   Additional Short Term Goals Yes   PT SHORT TERM GOAL #6   Title Patient tolerates wear >90% of awake hours with limb pain </= 4/10.  TARGET DATE 08/10/2015   Time 4   Period Weeks   Status New   PT SHORT TERM GOAL #7   Title Patient ambulates 250' with single point cane & prosthesis with supervision.  TARGET DATE 08/10/2015   Time 4   Period Weeks   Status New   PT SHORT TERM GOAL #8   Title Patient negotiates ramps, curbs & stairs (1 rail) with single point cane & prosthesis with supervision.  TARGET DATE 08/10/2015   Time 4   Period Weeks   Status New          PT Long Term Goals - 07/16/15 1213    PT LONG TERM  GOAL #1   Title Patient demonstrates / verbalizes safe, independent prosthetic care to enable prosthesis use without issues.  (Target Date 07/13/2015) NEW TARGET DATE 09/07/2015   Baseline 07/16/2015 NOT MET patient requires cues to adjust ply socks and manage pain in residual limb at distal tibia.    Time 8   Period Weeks   Status On-going   PT LONG TERM GOAL #2   Title Patient tolerates wear of prosthesis >90% of awake hours without skin issues or limb tenderness to enable function throughout her day.  (Target Date 07/13/2015)  NEW TARGET DATE 09/07/2015   Baseline NOT MET 07/16/2015 Patient is wearing all awake hours but has one area with sutures working way out & distal tibia pain 7/10.    Time 8   Period Weeks   Status On-going   PT LONG TERM GOAL #3   Title Berg Balance >45/56 with prosthesis to reduce rall risk.  (Target Date 07/13/2015)  NEW TARGET DATE 09/07/2015   Baseline NOT MET 07/11/2015  Berg Balance 41/56   Time 8   Period Weeks   Status On-going   PT LONG TERM GOAL #4   Title Timed Up & Go with prosthesis only <13.5 sec to indicate lower fall risk.  (Target Date 07/13/2015)  NEW  TARGET DATE 09/07/2015   Baseline NOT RETESTED   Time 8   Period Weeks   Status On-going   PT LONG TERM GOAL #5   Title Patient ambulates 100' around furniture carrying laundry basket with prosthesis only modified indepedent.  (Target Date 07/13/2015)  NEW TARGET DATE 09/07/2015   Baseline NOT MET 07/16/2015 Limb is too painful to ambulate without device yet.    Time 8   Period Weeks   Status On-going   PT LONG TERM GOAL #6   Title Patient amulates 400' with single point cane outside surfaces including grass, ramps & curbs with prosthesis modified independent to enable community mobility.  (Target Date 07/13/2015)  NEW TARGET DATE 09/07/2015   Baseline NOT MET 07/16/2015 Patient ambulates 100' with single point cane inside level surfaces with minimal guard.    Time 8   Period Weeks   Status On-going          Plan - 07/16/15 1011    Clinical Impression Statement Skilled session addressing prosthetic care and deficits in balance and ambulation. Patient is able to ambulate further before pain or fatigue in residual limb. Patient is becoming more comfortable challenging balance and is making continuous progress toward goals.   Pt will benefit from skilled therapeutic intervention in order to improve on the following deficits Abnormal gait;Decreased activity tolerance;Decreased balance;Decreased endurance;Decreased skin integrity;Decreased range of motion;Decreased mobility;Decreased strength;Increased edema;Postural dysfunction;Prosthetic Dependency   Rehab Potential Good   PT Frequency 2x / week   PT Duration 8 weeks   PT Treatment/Interventions ADLs/Self Care Home Management;DME Instruction;Gait training;Stair training;Functional mobility training;Therapeutic activities;Therapeutic exercise;Balance training;Neuromuscular re-education;Patient/family education;Prosthetic Training   PT Next Visit Plan Continue with gait including barriers. Continue with balance training. Consider addition of corner  exercises to HEP.   PT Home Exercise Plan 06/06/2015 HEP at sink   Consulted and Agree with Plan of Care Patient;Family member/caregiver   Family Member Consulted grandson (adult)        Problem List Patient Active Problem List   Diagnosis Date Noted  . Loss or death of child 2015/06/09  . Status post below knee amputation of left lower extremity ([place]) 01/31/2015  . Non-healing wound of lower extremity 01/29/2015  .  Ulcer of toe of left foot ([place]) 12/27/2014  . Essential hypertension 08/22/2014    Bayard Beaver, [place] 07/16/2015, 11:13 AM  [facility] [address] [place], [name], [address] Phone: [phone]   Fax:  [phone]  Name: SKYELYN SCRUGGS MRN: [id number] Date of Birth: [birth year]  This note has been reviewed and edited by supervising CI.  Willow Ora, PTA, [facility] [address] [place], [place] [address] [phone] 07/16/2015, 1:38 PM

## 2015-07-18 ENCOUNTER — Ambulatory Visit: Payer: Medicare Other | Admitting: Physical Therapy

## 2015-07-18 ENCOUNTER — Encounter: Payer: Medicare Other | Admitting: Physical Therapy

## 2015-07-18 ENCOUNTER — Encounter: Payer: Self-pay | Admitting: Physical Therapy

## 2015-07-18 DIAGNOSIS — Z7409 Other reduced mobility: Secondary | ICD-10-CM | POA: Diagnosis not present

## 2015-07-18 DIAGNOSIS — Z5189 Encounter for other specified aftercare: Secondary | ICD-10-CM | POA: Diagnosis not present

## 2015-07-18 DIAGNOSIS — R29818 Other symptoms and signs involving the nervous system: Secondary | ICD-10-CM | POA: Diagnosis not present

## 2015-07-18 DIAGNOSIS — Z4789 Encounter for other orthopedic aftercare: Secondary | ICD-10-CM

## 2015-07-18 DIAGNOSIS — R269 Unspecified abnormalities of gait and mobility: Secondary | ICD-10-CM

## 2015-07-18 DIAGNOSIS — R2681 Unsteadiness on feet: Secondary | ICD-10-CM | POA: Diagnosis not present

## 2015-07-18 DIAGNOSIS — R531 Weakness: Secondary | ICD-10-CM | POA: Diagnosis not present

## 2015-07-18 NOTE — Therapy (Signed)
[facility] [address] [place], [name], [address] Phone: [phone]   Fax:  [phone]  Physical Therapy Treatment  Patient Details  Name: [name] MRN: [id number] Date of Birth: 10-21-1931 Referring Provider: Alger Simons, MD  Encounter Date: 07/18/2015      PT End of Session - 07/18/15 0936    Visit Number 19   Number of Visits 33   Date for PT Re-Evaluation 09/07/15   Authorization Type Medicare G-Code & progress report every 10 visits   PT Start Time 0847   PT Stop Time 0930   PT Time Calculation (min) 43 min   Equipment Utilized During Treatment Gait belt   Activity Tolerance Patient tolerated treatment well;Patient limited by pain   Behavior During Therapy [facility] for tasks assessed/performed      Past Medical History  Diagnosis Date  . Hypertension   . PAD (peripheral artery disease) ([place])   . Arthritis   . Cancer ([place])     utertrine    Past Surgical History  Procedure Laterality Date  . Spine surgery    . Knee surgery Right 1984  . Peripheral vascular catheterization N/A 09/15/2014    Procedure: Abdominal Aortogram;  Surgeon: Elam Dutch, MD;  Location: [facility] INVASIVE CV LAB CUPID;  Service: Cardiovascular;  Laterality: N/A;  . Peripheral vascular catheterization Bilateral 09/15/2014    Procedure: Lower Extremity Angiography;  Surgeon: Elam Dutch, MD;  Location: [facility];  Service: Cardiovascular;  Laterality: Bilateral;  . Abdominal hysterectomy      partial  . Amputation Left 01/29/2015    Procedure: Left  AMPUTATION BELOW KNEE;  Surgeon: Elam Dutch, MD;  Location: [place];  Service: Vascular;  Laterality: Left;    There were no vitals filed for this visit.  Visit Diagnosis:  Impaired functional mobility and activity tolerance  Abnormality of gait  Encounter for prosthetic gait training      Subjective Assessment - 07/18/15 0851    Subjective Patient  stated her legs have both been very painful today and have been since yesterday. Patient states no new falls.    Patient is accompained by: Family member  grandson   Patient Stated Goals She wants to use prosthesis to resume active lifestyle (clean house including heavy duty) go outside to [employer] garden, yard work.    Currently in Pain? Yes   Pain Score 10-Worst pain ever   Pain Orientation Left   Pain Descriptors / Indicators Burning   Pain Type Acute pain   Pain Onset Yesterday   Aggravating Factors  Walking   Pain Relieving Factors Staying off it   Multiple Pain Sites Yes   Pain Score 3   Pain Location Foot   Pain Orientation Right   Pain Descriptors / Indicators Pressure   Pain Onset Yesterday   Aggravating Factors  Walking   Pain Relieving Factors Staying off it.          [facility] Adult PT Treatment/Exercise - 07/18/15 0916    Ambulation/Gait   Ambulation/Gait Yes   Ambulation/Gait Assistance 5: Supervision   Ambulation/Gait Assistance Details Verbal cues for increased step length and decrease abduction of prosthesis.   Ambulation Distance (Feet) 360 Feet  patient limited by pain. 2 additional trials at 120'   Assistive device Prosthesis;Straight cane   Gait Pattern Step-through pattern;Decreased arm swing - left;Decreased step length - right;Decreased stance time - left;Decreased stride length;Decreased hip/knee flexion - left;Decreased weight shift to left;Right  flexed knee in stance;Left flexed knee in stance;Antalgic;Abducted - left;Trunk flexed;Poor foot clearance - left   Ambulation Surface Level;Indoor   Prosthetics   Prosthetic Care Comments  Patient has increased pain in residual limb today. Sock adjustments necessary to combat increased pain which is interfering with patient's mobility.   Current prosthetic wear tolerance (days/week)  daily   Current prosthetic wear tolerance (#hours/day)  wearing all awake hours, drying q4 hours/prn   Edema non-pitting   Education  Provided Skin check;Residual limb care;Correct ply sock adjustment  Ply sock adjustment needed multiple times to combat pain.   Person(s) Educated Patient;Other (comment)  grandson (adult)   Education Method Explanation;Verbal cues   Education Method Verbalized understanding;Returned demonstration   Donning Prosthesis Supervision   Doffing Prosthesis Modified independent (device/increased time)            PT Short Term Goals - 07/16/15 1220    PT SHORT TERM GOAL #1   Title Patient demonstrates understanding of prosthetic care with minimal cues required. (Target Date 06/15/2015)   Baseline MET 06/13/2015   Status Achieved   PT SHORT TERM GOAL #2   Title Patient tolerates wear of prosthesis >12hrs total /day with no negative changes to wound & no tenderness on limb.  (Target Date 06/15/2015)   Baseline Partially MET 06/13/2015  Patient tolerating wear up to 10 hrs total /day with no negative changes to wound but tenderness in limb that appears to be improving.    Status Partially Met   PT SHORT TERM GOAL #3   Title Patient reaches 10", rotates shoulders to look behind her and picks up objects from floor without UE support with supervision.  (Target Date 06/15/2015)   Baseline MET 06/13/2015   Status Achieved   PT SHORT TERM GOAL #4   Title Patient ambulates 200' with single point cane & prosthesis with supervision.  (Target Date 06/15/2015)   Baseline 06/15/15: pt able to ambulate the distance, still needing min guard to min assist for balance   Status Partially Met   PT SHORT TERM GOAL #5   Title Patient negotiates ramps, curbs & stairs (1 rail) with single point cane & prosthesis with minimal assist.  (Target Date 06/15/2015)   Baseline met on 06/15/15   Status Achieved   Additional Short Term Goals   Additional Short Term Goals Yes   PT SHORT TERM GOAL #6   Title Patient tolerates wear >90% of awake hours with limb pain </= 4/10.  TARGET DATE 08/10/2015   Time 4   Period Weeks   Status New    PT SHORT TERM GOAL #7   Title Patient ambulates 250' with single point cane & prosthesis with supervision.  TARGET DATE 08/10/2015   Time 4   Period Weeks   Status New   PT SHORT TERM GOAL #8   Title Patient negotiates ramps, curbs & stairs (1 rail) with single point cane & prosthesis with supervision.  TARGET DATE 08/10/2015   Time 4   Period Weeks   Status New           PT Long Term Goals - 07/16/15 1213    PT LONG TERM GOAL #1   Title Patient demonstrates / verbalizes safe, independent prosthetic care to enable prosthesis use without issues.  (Target Date 07/13/2015) NEW TARGET DATE 09/07/2015   Baseline 07/16/2015 NOT MET patient requires cues to adjust ply socks and manage pain in residual limb at distal tibia.    Time 8   Period  Weeks   Status On-going   PT LONG TERM GOAL #2   Title Patient tolerates wear of prosthesis >90% of awake hours without skin issues or limb tenderness to enable function throughout her day.  (Target Date 07/13/2015)  NEW TARGET DATE 09/07/2015   Baseline NOT MET 07/16/2015 Patient is wearing all awake hours but has one area with sutures working way out & distal tibia pain 7/10.    Time 8   Period Weeks   Status On-going   PT LONG TERM GOAL #3   Title Berg Balance >45/56 with prosthesis to reduce rall risk.  (Target Date 07/13/2015)  NEW TARGET DATE 09/07/2015   Baseline NOT MET 07/11/2015  Berg Balance 41/56   Time 8   Period Weeks   Status On-going   PT LONG TERM GOAL #4   Title Timed Up & Go with prosthesis only <13.5 sec to indicate lower fall risk.  (Target Date 07/13/2015)  NEW TARGET DATE 09/07/2015   Baseline NOT RETESTED   Time 8   Period Weeks   Status On-going   PT LONG TERM GOAL #5   Title Patient ambulates 100' around furniture carrying laundry basket with prosthesis only modified indepedent.  (Target Date 07/13/2015)  NEW TARGET DATE 09/07/2015   Baseline NOT MET 07/16/2015 Limb is too painful to ambulate without device yet.    Time 8   Period Weeks    Status On-going   PT LONG TERM GOAL #6   Title Patient amulates 400' with single point cane outside surfaces including grass, ramps & curbs with prosthesis modified independent to enable community mobility.  (Target Date 07/13/2015)  NEW TARGET DATE 09/07/2015   Baseline NOT MET 07/16/2015 Patient ambulates 100' with single point cane inside level surfaces with minimal guard.    Time 8   Period Weeks   Status On-going          Plan - 07/18/15 7035    Clinical Impression Statement Skilled session addressing prosthetic care and ambulation with adjustments to ply socks due to patient experiencing significantly increased pain. Readjustments to ply socks decreased pain some Had pt decrease number of cut off socks and increase number of full length socks to allow limb to sit higher in socket. This decreased pressure at bil tibial tuberosities as anticipated, which decreased pain pt had with weight bearing and gait. Pt has scheduled appointment with prosthetist Gerald Stabs) tomorrow that her grandson set up yesterday to have prosthetic pain/issues further adjusted. Patient is making slow but steady progress toward goals.   Pt will benefit from skilled therapeutic intervention in order to improve on the following deficits Abnormal gait;Decreased activity tolerance;Decreased balance;Decreased endurance;Decreased skin integrity;Decreased range of motion;Decreased mobility;Decreased strength;Increased edema;Postural dysfunction;Prosthetic Dependency   Rehab Potential Good   PT Frequency 2x / week   PT Duration 8 weeks   PT Treatment/Interventions ADLs/Self Care Home Management;DME Instruction;Gait training;Stair training;Functional mobility training;Therapeutic activities;Therapeutic exercise;Balance training;Neuromuscular re-education;Patient/family education;Prosthetic Training   PT Next Visit Plan Review changes made to prosthesis by prosthetist on 07/19/15. Continue gait with barriers and static/dynamic balance  challenges. Consider addition of corner exs to HEP for increased balance reactions. G-CODE DUE NEXT VISIT.   PT Home Exercise Plan 06/06/2015 HEP at sink   Consulted and Agree with Plan of Care Patient;Family member/caregiver   Family Member Consulted grandson (adult)        Problem List Patient Active Problem List   Diagnosis Date Noted  . Loss or death of child 2015-06-30  . Status  post below knee amputation of left lower extremity ([place]) 01/31/2015  . Non-healing wound of lower extremity 01/29/2015  . Ulcer of toe of left foot ([place]) 12/27/2014  . Essential hypertension 08/22/2014    Bayard Beaver, SPTA 07/18/2015, 1:01 PM  [facility] [address] [place], [name], [address] Phone: [phone]   Fax:  [phone]  Name: MARLINDA MIRANDA MRN: [id number] Date of Birth: 02/12/1932  This note has been reviewed and edited by supervising CI.  Willow Ora, PTA, [facility] [address], [place], [place] [address] [phone] 07/18/2015, 3:17 PM

## 2015-07-20 ENCOUNTER — Encounter: Payer: Medicare Other | Admitting: Physical Therapy

## 2015-07-24 ENCOUNTER — Ambulatory Visit: Payer: Medicare Other | Admitting: Physical Therapy

## 2015-07-24 ENCOUNTER — Encounter: Payer: Self-pay | Admitting: Physical Therapy

## 2015-07-24 DIAGNOSIS — Z7409 Other reduced mobility: Secondary | ICD-10-CM | POA: Diagnosis not present

## 2015-07-24 DIAGNOSIS — R29818 Other symptoms and signs involving the nervous system: Secondary | ICD-10-CM | POA: Diagnosis not present

## 2015-07-24 DIAGNOSIS — Z4789 Encounter for other orthopedic aftercare: Secondary | ICD-10-CM

## 2015-07-24 DIAGNOSIS — M24669 Ankylosis, unspecified knee: Secondary | ICD-10-CM

## 2015-07-24 DIAGNOSIS — Z89512 Acquired absence of left leg below knee: Secondary | ICD-10-CM

## 2015-07-24 DIAGNOSIS — I10 Essential (primary) hypertension: Secondary | ICD-10-CM

## 2015-07-24 DIAGNOSIS — R2681 Unsteadiness on feet: Secondary | ICD-10-CM

## 2015-07-24 DIAGNOSIS — R531 Weakness: Secondary | ICD-10-CM

## 2015-07-24 DIAGNOSIS — R2689 Other abnormalities of gait and mobility: Secondary | ICD-10-CM

## 2015-07-24 DIAGNOSIS — Z5189 Encounter for other specified aftercare: Secondary | ICD-10-CM | POA: Diagnosis not present

## 2015-07-24 DIAGNOSIS — R269 Unspecified abnormalities of gait and mobility: Secondary | ICD-10-CM

## 2015-07-24 NOTE — Therapy (Signed)
[facility] [address] [place], [name], [address] Phone: [phone]   Fax:  [phone]  Physical Therapy Treatment  Patient Details  Name: [name] MRN: [id number] Date of Birth: 1932-01-07 Referring Provider: Alger Simons, MD  Encounter Date: 07/24/2015      PT End of Session - 07/24/15 0845    Visit Number 20   Number of Visits 33   Date for PT Re-Evaluation 09/07/15   Authorization Type Medicare G-Code & progress report every 10 visits   PT Start Time 0800   PT Stop Time 0844   PT Time Calculation (min) 44 min   Equipment Utilized During Treatment Gait belt   Activity Tolerance Patient tolerated treatment well;Patient limited by pain   Behavior During Therapy [facility] for tasks assessed/performed      Past Medical History  Diagnosis Date  . Hypertension   . PAD (peripheral artery disease) ([place])   . Arthritis   . Cancer ([place])     utertrine    Past Surgical History  Procedure Laterality Date  . Spine surgery    . Knee surgery Right 1984  . Peripheral vascular catheterization N/A 09/15/2014    Procedure: Abdominal Aortogram;  Surgeon: Elam Dutch, MD;  Location: [facility] INVASIVE CV LAB CUPID;  Service: Cardiovascular;  Laterality: N/A;  . Peripheral vascular catheterization Bilateral 09/15/2014    Procedure: Lower Extremity Angiography;  Surgeon: Elam Dutch, MD;  Location: [facility];  Service: Cardiovascular;  Laterality: Bilateral;  . Abdominal hysterectomy      partial  . Amputation Left 01/29/2015    Procedure: Left  AMPUTATION BELOW KNEE;  Surgeon: Elam Dutch, MD;  Location: [facility];  Service: Vascular;  Laterality: Left;    There were no vitals filed for this visit.  Visit Diagnosis:  Impaired functional mobility and activity tolerance  Abnormality of gait  Encounter for prosthetic gait training  Balance problems  Unsteadiness on feet  Weakness  generalized  Status post below knee amputation of left lower extremity ([facility])  Decreased range of knee movement, unspecified laterality  Essential hypertension      Subjective Assessment - 07/24/15 0804    Subjective Her right foot & knee have been hurting to point that she has limited weight on foot using RW since last PT session.    Patient is accompained by: Family member   Pertinent History PVD, HTN, Arthritis, uterine CA, knee surgery, spine surgery   Patient Stated Goals She wants to use prosthesis to resume active lifestyle (clean house including heavy duty) go outside to flower garden, yard work.    Currently in Pain? No/denies     Self-Care: See patient education.  Patient's right foot has mild discoloration plantar surface under 1st & 2nd MTP and callous / pre-blister on medial great toe with no openings. 1st phalange is adducted.  PT set up appointment tomorrow with PCP Dr. Janett Billow Copland to assess foot. PT changed STGs & LTGs to protect right foot with less weight bearing. Patient in agreement.  Prosthetic Training: New distal pad has improved comfort with prosthesis. Patient donnes prosthesis correctly. PT used tweezers to remove more suture from medial incision line.  PT instructed with demo in rollator walker use including brakes, turning, position & sit to/from stand on seat. Patient ambulated 150' X 2 with rollator including sitting on seat with supervision.  PT Education - 07/24/15 0800    Education provided Yes   Education Details shoes, length, width, depth & support for Rt foot, lacing options to work on not "tight" feeling, using rollator or RW for community & in house use cane or RW.    Person(s) Educated Patient;Other (comment)  adult grandson   Methods Explanation;Demonstration;Verbal cues   Comprehension Verbalized understanding;Verbal cues required          PT Short Term Goals - 07/24/15 0845    PT  SHORT TERM GOAL #1   Title Patient demonstrates understanding of prosthetic care with minimal cues required. (Target Date 06/15/2015)   Baseline MET 06/13/2015   Status Achieved   PT SHORT TERM GOAL #2   Title Patient tolerates wear of prosthesis >12hrs total /day with no negative changes to wound & no tenderness on limb.  (Target Date 06/15/2015)   Baseline Partially MET 06/13/2015  Patient tolerating wear up to 10 hrs total /day with no negative changes to wound but tenderness in limb that appears to be improving.    Status Partially Met   PT SHORT TERM GOAL #3   Title Patient reaches 10", rotates shoulders to look behind her and picks up objects from floor without UE support with supervision.  (Target Date 06/15/2015)   Baseline MET 06/13/2015   Status Achieved   PT SHORT TERM GOAL #4   Title Patient ambulates 200' with single point cane & prosthesis with supervision.  (Target Date 06/15/2015)   Baseline 06/15/15: pt able to ambulate the distance, still needing min guard to min assist for balance   Status Partially Met   PT SHORT TERM GOAL #5   Title Patient negotiates ramps, curbs & stairs (1 rail) with single point cane & prosthesis with minimal assist.  (Target Date 06/15/2015)   Baseline met on 06/15/15   Status Achieved   PT SHORT TERM GOAL #6   Title Patient tolerates wear >90% of awake hours with limb pain </= 4/10.  TARGET DATE 08/10/2015   Time 4   Period Weeks   Status New   PT SHORT TERM GOAL #7   Title Patient ambulates 250' with LRAD & prosthesis with supervision.  TARGET DATE 08/10/2015   Time 4   Period Weeks   Status Revised   PT SHORT TERM GOAL #8   Title Patient negotiates ramps, curbs & stairs (1 rail) with LRAD & prosthesis with supervision.  TARGET DATE 08/10/2015   Time 4   Period Weeks   Status Revised           PT Long Term Goals - 07/24/15 0845    PT LONG TERM GOAL #1   Title Patient demonstrates / verbalizes safe, independent prosthetic care to enable prosthesis use  without issues.  (Target Date 07/13/2015) NEW TARGET DATE 09/07/2015   Baseline 07/16/2015 NOT MET patient requires cues to adjust ply socks and manage pain in residual limb at distal tibia.    Time 8   Period Weeks   Status On-going   PT LONG TERM GOAL #2   Title Patient tolerates wear of prosthesis >90% of awake hours without skin issues or limb tenderness to enable function throughout her day.  (Target Date 07/13/2015)  NEW TARGET DATE 09/07/2015   Baseline NOT MET 07/16/2015 Patient is wearing all awake hours but has one area with sutures working way out & distal tibia pain 7/10.    Time 8   Period Weeks   Status On-going  PT LONG TERM GOAL #3   Title Berg Balance >45/56 with prosthesis to reduce rall risk.  (Target Date 07/13/2015)  NEW TARGET DATE 09/07/2015   Baseline NOT MET 07/11/2015  Berg Balance 41/56   Time 8   Period Weeks   Status On-going   PT LONG TERM GOAL #4   Title Timed Up & Go with prosthesis only <13.5 sec to indicate lower fall risk.  (Target Date 07/13/2015)  NEW TARGET DATE 09/07/2015   Baseline NOT RETESTED   Time 8   Period Weeks   Status On-going   PT LONG TERM GOAL #5   Title Patient ambulates 100' around furniture carrying laundry basket with prosthesis modified indepedent.  (Target Date 07/13/2015)  NEW TARGET DATE 09/07/2015   Baseline NOT MET 07/16/2015 Limb is too painful to ambulate without device yet.    Time 8   Period Weeks   Status On-going   PT LONG TERM GOAL #6   Title Patient amulates 400' with single point cane outside surfaces including grass, ramps & curbs with prosthesis modified independent to enable community mobility.  (Target Date 07/13/2015)  NEW TARGET DATE 09/07/2015   Baseline NOT MET 07/16/2015 Patient ambulates 100' with single point cane inside level surfaces with minimal guard.    Time 8   Period Weeks   Status On-going               Plan - 08/05/2015 0845    Clinical Impression Statement [name]'s non-amputated limb has become painful  from overuse probably. PT updated STGs & LTGs to community mobility with walker or rollator and household with cane to reduce stress / strain on right LE. PT set up appointment with MD to have right foot checked.    Pt will benefit from skilled therapeutic intervention in order to improve on the following deficits Abnormal gait;Decreased activity tolerance;Decreased balance;Decreased endurance;Decreased skin integrity;Decreased range of motion;Decreased mobility;Decreased strength;Increased edema;Postural dysfunction;Prosthetic Dependency   Rehab Potential Good   PT Frequency 2x / week   PT Duration 8 weeks   PT Treatment/Interventions ADLs/Self Care Home Management;DME Instruction;Gait training;Stair training;Functional mobility training;Therapeutic activities;Therapeutic exercise;Balance training;Neuromuscular re-education;Patient/family education;Prosthetic Training   PT Next Visit Plan instruct in rollator use for community activities including ramps & curbs. Assess right foot. Hold 2nd appointment this week to enable right foot to recover.    PT Home Exercise Plan 06/06/2015 HEP at sink; consider adding corner exs.   Consulted and Agree with Plan of Care Patient;Family member/caregiver   Family Member Consulted grandson (adult)          G-Codes - 2015/08/05 1401    Functional Assessment Tool Used Patient is wearing prosthesis all awake hours with mild limb pain with addition of distal pad by prosthetist in last week. Cues on management of socks & pain / discomfort.    Functional Limitation Self care   Self Care Current Status [phone]) At least 20 percent but less than 40 percent impaired, limited or restricted   Self Care Goal Status (D9741) At least 1 percent but less than 20 percent impaired, limited or restricted      Problem List Patient Active Problem List   Diagnosis Date Noted  . Loss or death of child 06/14/15  . Status post below knee amputation of left lower extremity ([place])  01/31/2015  . Non-healing wound of lower extremity 01/29/2015  . Ulcer of toe of left foot ([place]) 12/27/2014  . Essential hypertension 08/22/2014    [name] PT, DPT August 05, 2015,  2:09 PM  [facility] [address] [place], [name], [address] Phone: [phone]   Fax:  [phone]  Name: [name] MRN: [id number] Date of Birth: 12/08/1931

## 2015-07-25 ENCOUNTER — Ambulatory Visit (INDEPENDENT_AMBULATORY_CARE_PROVIDER_SITE_OTHER): Payer: Medicare Other | Admitting: Family Medicine

## 2015-07-25 ENCOUNTER — Encounter: Payer: Self-pay | Admitting: Family Medicine

## 2015-07-25 ENCOUNTER — Encounter: Payer: Medicare Other | Admitting: Physical Therapy

## 2015-07-25 VITALS — BP 138/72 | HR 83 | Wt 143.8 lb

## 2015-07-25 DIAGNOSIS — I739 Peripheral vascular disease, unspecified: Secondary | ICD-10-CM

## 2015-07-25 NOTE — Patient Instructions (Signed)
Please let me know if there is any change in your condition or any breaks in your skin. It was great to see you today!

## 2015-07-25 NOTE — Progress Notes (Signed)
[place] at [facility] [address], [place], [place] [address] [phone]  Date:  07/25/2015   Name:  [name]   DOB:  Jul 04, 1931   MRN:  [id number]  PCP:  Lamar Blinks, MD    Chief Complaint: Foot Problem   History of Present Illness:  [name] is a 82 y.o. very pleasant female patient who presents with the following:  History of severe PAD s/p left BKA.  Her PT was concerned about the appearance of her right foot and wanted her to be seen.  Pt states that she does not have any pain or numbness in her foot, no change in the appearance of the foot.  It will be quite red when dependent but blanches when raised.  She does not have any concerns herself but came in because she was asked to.   She is doing well with her walker but has more pain when she tried to use just a cane.  Overall she has done well with her left BKA. She knows that she will probably need the same on the right eventually   Patient Active Problem List   Diagnosis Date Noted  . Loss or death of child 2015/06/10  . Status post below knee amputation of left lower extremity ([place]) 01/31/2015  . Non-healing wound of lower extremity 01/29/2015  . Ulcer of toe of left foot ([place]) 12/27/2014  . Essential hypertension 08/22/2014    Past Medical History  Diagnosis Date  . Hypertension   . PAD (peripheral artery disease) ([place])   . Arthritis   . Cancer ([place])     utertrine    Past Surgical History  Procedure Laterality Date  . Spine surgery    . Knee surgery Right 1984  . Peripheral vascular catheterization N/A 09/15/2014    Procedure: Abdominal Aortogram;  Surgeon: Elam Dutch, MD;  Location: [facility] INVASIVE CV LAB CUPID;  Service: Cardiovascular;  Laterality: N/A;  . Peripheral vascular catheterization Bilateral 09/15/2014    Procedure: Lower Extremity Angiography;  Surgeon: Elam Dutch, MD;  Location: [facility];  Service:  Cardiovascular;  Laterality: Bilateral;  . Abdominal hysterectomy      partial  . Amputation Left 01/29/2015    Procedure: Left  AMPUTATION BELOW KNEE;  Surgeon: Elam Dutch, MD;  Location: [facility] OR;  Service: Vascular;  Laterality: Left;    Social History  Substance Use Topics  . Smoking status: Never Smoker   . Smokeless tobacco: Never Used  . Alcohol Use: No    Family History  Problem Relation Age of Onset  . Diabetes Mother   . Heart disease Mother   . Dementia Sister   . Diabetes Son     No Known Allergies  Medication list has been reviewed and updated.  Current Outpatient Prescriptions on File Prior to Visit  Medication Sig Dispense Refill  . clopidogrel (PLAVIX) 75 MG tablet Take 1 tablet (75 mg total) by mouth daily. 90 tablet 3  . hydrochlorothiazide (HYDRODIURIL) 25 MG tablet Take 1 tablet (25 mg total) by mouth daily. 90 tablet 3  . omeprazole (PRILOSEC) 20 MG capsule Take 20 mg by mouth daily.    Vladimir Faster Glycol-Propyl Glycol (SYSTANE FREE OP) Place 1 drop into both eyes daily. For dry eyes     No current facility-administered medications on file prior to visit.    Review of Systems:  As per HPI- otherwise negative.  Physical Examination: Filed Vitals:   07/25/15 1432  BP: 138/72  Pulse: 83   Filed Vitals:   07/25/15 1432  Weight: 143 lb 12.8 oz (65.227 kg)   Body mass index is 23.93 kg/(m^2). Ideal Body Weight:    GEN: WDWN, NAD, Non-toxic, A & O x 3, looks well HEENT: Atraumatic, Normocephalic. Neck supple. No masses, No LAD. Ears and Nose: No external deformity. CV: RRR, No M/G/R. No JVD. No thrill. No extra heart sounds. PULM: CTA B, no wheezes, crackles, rhonchi. No retractions. No resp. distress. No accessory muscle use. EXTR: No c/c/e NEURO Normal gait for pt- left BKA PSYCH: Normally interactive. Conversant. Not depressed or anxious appearing.  Calm demeanor.  S/p left BKA.  Using a walker Here today with her sone and  grand-daughter  Her right foot is plephoric, blanches easily and takes a couple of seconds to refill. Normal movement of the toes and normal sensation. No skin breakdown or ulcers present  Assessment and Plan: Peripheral arterial disease (HCC)  Severe LE PAD.  She has already had a left BKA and will have a right as well eventually.  She is not concerned about anything in particular but her PT had asked her to be seen.  She will alert me as to any change or worsening of her condition  Signed Lamar Blinks, MD

## 2015-07-26 ENCOUNTER — Encounter: Payer: Medicare Other | Admitting: Physical Therapy

## 2015-07-27 ENCOUNTER — Encounter: Payer: Medicare Other | Admitting: Physical Therapy

## 2015-07-31 ENCOUNTER — Encounter: Payer: Self-pay | Admitting: Physical Therapy

## 2015-07-31 ENCOUNTER — Ambulatory Visit: Payer: Medicare Other | Admitting: Physical Therapy

## 2015-07-31 DIAGNOSIS — R29818 Other symptoms and signs involving the nervous system: Secondary | ICD-10-CM | POA: Diagnosis not present

## 2015-07-31 DIAGNOSIS — R531 Weakness: Secondary | ICD-10-CM | POA: Diagnosis not present

## 2015-07-31 DIAGNOSIS — R269 Unspecified abnormalities of gait and mobility: Secondary | ICD-10-CM | POA: Diagnosis not present

## 2015-07-31 DIAGNOSIS — Z5189 Encounter for other specified aftercare: Secondary | ICD-10-CM | POA: Diagnosis not present

## 2015-07-31 DIAGNOSIS — Z89512 Acquired absence of left leg below knee: Secondary | ICD-10-CM

## 2015-07-31 DIAGNOSIS — R2689 Other abnormalities of gait and mobility: Secondary | ICD-10-CM

## 2015-07-31 DIAGNOSIS — Z4789 Encounter for other orthopedic aftercare: Secondary | ICD-10-CM

## 2015-07-31 DIAGNOSIS — Z7409 Other reduced mobility: Secondary | ICD-10-CM

## 2015-07-31 DIAGNOSIS — R2681 Unsteadiness on feet: Secondary | ICD-10-CM | POA: Diagnosis not present

## 2015-07-31 NOTE — Therapy (Signed)
[name], [name], [address] Phone: [phone]   Fax:  [phone]  Physical Therapy Treatment  Patient Details  Name: [name] MRN: [id number] Date of Birth: [birth year] Referring Provider: Alger Simons, MD  Encounter Date: 07/31/2015      PT End of Session - 07/31/15 0845    Visit Number 21   Number of Visits 33   Date for PT Re-Evaluation 09/07/15   Authorization Type Medicare G-Code & progress report every 10 visits   PT Start Time 0800   PT Stop Time 0844   PT Time Calculation (min) 44 min   Equipment Utilized During Treatment Gait belt   Activity Tolerance Patient tolerated treatment well;Patient limited by pain   Behavior During Therapy [facility] for tasks assessed/performed      Past Medical History  Diagnosis Date  . Hypertension   . PAD (peripheral artery disease) ([place])   . Arthritis   . Cancer ([place])     utertrine    Past Surgical History  Procedure Laterality Date  . Spine surgery    . Knee surgery Right 1984  . Peripheral vascular catheterization N/A 09/15/2014    Procedure: Abdominal Aortogram;  Surgeon: Elam Dutch, MD;  Location: [facility] INVASIVE CV LAB CUPID;  Service: Cardiovascular;  Laterality: N/A;  . Peripheral vascular catheterization Bilateral 09/15/2014    Procedure: Lower Extremity Angiography;  Surgeon: Elam Dutch, MD;  Location: [facility];  Service: Cardiovascular;  Laterality: Bilateral;  . Abdominal hysterectomy      partial  . Amputation Left 01/29/2015    Procedure: Left  AMPUTATION BELOW KNEE;  Surgeon: Elam Dutch, MD;  Location: [facility];  Service: Vascular;  Laterality: Left;    There were no vitals filed for this visit.  Visit Diagnosis:  Impaired functional mobility and activity tolerance  Abnormality of gait  Encounter for prosthetic gait training  Balance problems  Unsteadiness on feet  Weakness  generalized  Status post below knee amputation of left lower extremity (HCC)      Subjective Assessment - 07/31/15 0800    Subjective She saw Dr. Janett Billow Copland who reports continue to monitor right now. She needs to limit weight on right foot to protect it.    Patient is accompained by: Family member   Pertinent History PVD, HTN, Arthritis, uterine CA, knee surgery, spine surgery   Patient Stated Goals She wants to use prosthesis to resume active lifestyle (clean house including heavy duty) go outside to flower garden, yard work.    Currently in Pain? Yes   Pain Score 4    Pain Location Leg  residual limb distal tibia   Pain Orientation Left   Pain Descriptors / Indicators Sharp   Pain Onset More than a month ago   Pain Frequency Intermittent   Aggravating Factors  walking     Self Care: Right foot with no open areas. Callous over medial Great Toe with less redness & tenderness.  Shoes that patient purchased are breathable material, box shape to toe box and proper depth, width & length. Patient reports that they feel better. Patient was advised by MD to elevate legs with shoes off to let her feet dry and decrease edema. PT demo, instructed in elevation with heart as one of lowest points; using chair in bed to elevate; performing ankle exercises including pumps & A-Z for elevation.   Prosthetic Training with Transtibial Amputation prosthesis Patient's residual limb with  no breakdown. PT instructed in donning liner with pin oriented posteriorly to pull soft tissue towards distal tibia for padding. Patient verbalized & return demo understanding.  Since she is using distal pad to prevent her limb from seating to deep into socket, the pre-tibial pads may need to be lowered to put pressure on proper areas. Pt to call prosthetist. Patient ambulated 200' X 2 with rollator walker with cues on brake management. Sit to/from stand rollator walker seat with cues on technique & safety. Patient  negotiated ramp & curb with rollator walker with PT verbal cues on technique with rollator walker.                               PT Short Term Goals - 07/31/15 1244    PT SHORT TERM GOAL #1   Title Patient demonstrates understanding of prosthetic care with minimal cues required. (Target Date 06/15/2015)   Baseline MET 06/13/2015   Status Achieved   PT SHORT TERM GOAL #2   Title Patient tolerates wear of prosthesis >12hrs total /day with no negative changes to wound & no tenderness on limb.  (Target Date 06/15/2015)   Baseline Partially MET 06/13/2015  Patient tolerating wear up to 10 hrs total /day with no negative changes to wound but tenderness in limb that appears to be improving.    Status Partially Met   PT SHORT TERM GOAL #3   Title Patient reaches 10", rotates shoulders to look behind her and picks up objects from floor without UE support with supervision.  (Target Date 06/15/2015)   Baseline MET 06/13/2015   Status Achieved   PT SHORT TERM GOAL #4   Title Patient ambulates 200' with single point cane & prosthesis with supervision.  (Target Date 06/15/2015)   Baseline 06/15/15: pt able to ambulate the distance, still needing min guard to min assist for balance   Status Partially Met   PT SHORT TERM GOAL #5   Title Patient negotiates ramps, curbs & stairs (1 rail) with single point cane & prosthesis with minimal assist.  (Target Date 06/15/2015)   Baseline met on 06/15/15   Status Achieved   PT SHORT TERM GOAL #6   Title Patient tolerates wear >90% of awake hours with limb pain </= 4/10.  TARGET DATE 08/10/2015   Time 4   Period Weeks   Status On-going   PT SHORT TERM GOAL #7   Title Patient ambulates 250' with LRAD & prosthesis with supervision.  TARGET DATE 08/10/2015   Time 4   Period Weeks   Status On-going   PT SHORT TERM GOAL #8   Title Patient negotiates ramps, curbs & stairs (1 rail) with LRAD & prosthesis with supervision.  TARGET DATE 08/10/2015   Time 4    Period Weeks   Status On-going           PT Long Term Goals - 07/31/15 1244    PT LONG TERM GOAL #1   Title Patient demonstrates / verbalizes safe, independent prosthetic care to enable prosthesis use without issues.  (Target Date 07/13/2015) NEW TARGET DATE 09/07/2015   Baseline 07/16/2015 NOT MET patient requires cues to adjust ply socks and manage pain in residual limb at distal tibia.    Time 8   Period Weeks   Status On-going   PT LONG TERM GOAL #2   Title Patient tolerates wear of prosthesis >90% of awake hours without skin issues or limb  tenderness to enable function throughout her day.  (Target Date 07/13/2015)  NEW TARGET DATE 09/07/2015   Baseline NOT MET 07/16/2015 Patient is wearing all awake hours but has one area with sutures working way out & distal tibia pain 7/10.    Time 8   Period Weeks   Status On-going   PT LONG TERM GOAL #3   Title Berg Balance >45/56 with prosthesis to reduce rall risk.  (Target Date 07/13/2015)  NEW TARGET DATE 09/07/2015   Baseline NOT MET 07/11/2015  Berg Balance 41/56   Time 8   Period Weeks   Status On-going   PT LONG TERM GOAL #4   Title Timed Up & Go with prosthesis & cane <13.5 sec to indicate lower fall risk.  (Target Date 07/13/2015)  NEW TARGET DATE 09/07/2015   Baseline NOT RETESTED   Time 8   Period Weeks   Status Revised   PT LONG TERM GOAL #5   Title Patient ambulates 100' around furniture & uses rollator to carry laundry basket with prosthesis modified indepedent.  (Target Date 07/13/2015)  NEW TARGET DATE 09/07/2015   Baseline NOT MET 07/16/2015 Limb is too painful to ambulate without device yet.    Time 8   Period Weeks   Status Revised   PT LONG TERM GOAL #6   Title Patient amulates 43' with single point cane around household furniture with prosthesis modified independent.  (Target Date 07/13/2015)  NEW TARGET DATE 09/07/2015   Baseline NOT MET 07/16/2015 Patient ambulates 100' with single point cane inside level surfaces with minimal guard.     Time 8   Period Weeks   Status Revised               Plan - 07/31/15 1247    Clinical Impression Statement New shoes appear to put less pressure on medial great toe and llimited weight using rollator walker have improved pain in right foot. Patient appears for safety of right foot she needs to use walker for community based activities & cane for some household.    Pt will benefit from skilled therapeutic intervention in order to improve on the following deficits Abnormal gait;Decreased activity tolerance;Decreased balance;Decreased endurance;Decreased skin integrity;Decreased range of motion;Decreased mobility;Decreased strength;Increased edema;Postural dysfunction;Prosthetic Dependency   Rehab Potential Good   PT Frequency 2x / week   PT Duration 8 weeks   PT Treatment/Interventions ADLs/Self Care Home Management;DME Instruction;Gait training;Stair training;Functional mobility training;Therapeutic activities;Therapeutic exercise;Balance training;Neuromuscular re-education;Patient/family education;Prosthetic Training   PT Next Visit Plan rollator use for community activities including ramps & curbs & cane for household. Assess right foot & residual limb.    PT Home Exercise Plan 06/06/2015 HEP at sink; consider adding corner exs.   Consulted and Agree with Plan of Care Patient;Family member/caregiver   Family Member Consulted grandson (adult)        Problem List Patient Active Problem List   Diagnosis Date Noted  . Loss or death of child 06-03-15  . Status post below knee amputation of left lower extremity ([place]) 01/31/2015  . Non-healing wound of lower extremity 01/29/2015  . Ulcer of toe of left foot ([place]) 12/27/2014  . Essential hypertension 08/22/2014    Jamey Reas PT, DPT 07/31/2015, 1:02 PM  [facility] [address] [place], [name], [address] Phone: [phone]   Fax:  [phone]  Name: [name] MRN: [id number] Date of Birth: 22-Jan-1932

## 2015-08-01 ENCOUNTER — Encounter: Payer: Medicare Other | Admitting: Physical Therapy

## 2015-08-02 ENCOUNTER — Encounter: Payer: Medicare Other | Admitting: Physical Therapy

## 2015-08-03 ENCOUNTER — Encounter: Payer: Medicare Other | Admitting: Physical Therapy

## 2015-08-08 ENCOUNTER — Encounter: Payer: Self-pay | Admitting: Physical Therapy

## 2015-08-08 ENCOUNTER — Ambulatory Visit: Payer: Medicare Other | Admitting: Physical Therapy

## 2015-08-08 DIAGNOSIS — R29818 Other symptoms and signs involving the nervous system: Secondary | ICD-10-CM | POA: Diagnosis not present

## 2015-08-08 DIAGNOSIS — Z4789 Encounter for other orthopedic aftercare: Secondary | ICD-10-CM

## 2015-08-08 DIAGNOSIS — Z7409 Other reduced mobility: Secondary | ICD-10-CM

## 2015-08-08 DIAGNOSIS — R269 Unspecified abnormalities of gait and mobility: Secondary | ICD-10-CM

## 2015-08-08 DIAGNOSIS — R2681 Unsteadiness on feet: Secondary | ICD-10-CM

## 2015-08-08 DIAGNOSIS — R2689 Other abnormalities of gait and mobility: Secondary | ICD-10-CM

## 2015-08-08 DIAGNOSIS — R531 Weakness: Secondary | ICD-10-CM

## 2015-08-08 DIAGNOSIS — Z5189 Encounter for other specified aftercare: Secondary | ICD-10-CM | POA: Diagnosis not present

## 2015-08-08 NOTE — Patient Instructions (Signed)
Feet Together, Head Motion - Eyes Closed    Standing in corner with stable object in front (such as chair which does not roll or rollator with brakes on). With eyes closed and feet together, move head slowly: 1. Up and down,  2. Left and right,  3. Diagonally. Repeat __10__ times per direction. Do __1-2__ sessions per day. If exercises become too easy, stand on pillow or cushion.  Copyright  VHI. All rights reserved.

## 2015-08-08 NOTE — Therapy (Signed)
[facility] [address] [place], [name], [address] Phone: [phone]   Fax:  [phone]  Physical Therapy Treatment  Patient Details  Name: [name] MRN: [id number] Date of Birth: Mar 13, 1932 Referring Provider: Alger Simons, MD  Encounter Date: 08/08/2015      PT End of Session - 08/08/15 1005    Visit Number 22   Number of Visits 33   Date for PT Re-Evaluation 09/07/15   Authorization Type Medicare G-Code & progress report every 10 visits   PT Start Time 0802   PT Stop Time 0845   PT Time Calculation (min) 43 min   Equipment Utilized During Treatment Gait belt   Activity Tolerance Patient tolerated treatment well;Patient limited by pain   Behavior During Therapy [facility] for tasks assessed/performed      Past Medical History  Diagnosis Date  . Hypertension   . PAD (peripheral artery disease) ([place])   . Arthritis   . Cancer ([place])     utertrine    Past Surgical History  Procedure Laterality Date  . Spine surgery    . Knee surgery Right 1984  . Peripheral vascular catheterization N/A 09/15/2014    Procedure: Abdominal Aortogram;  Surgeon: Elam Dutch, MD;  Location: [facility] INVASIVE CV LAB CUPID;  Service: Cardiovascular;  Laterality: N/A;  . Peripheral vascular catheterization Bilateral 09/15/2014    Procedure: Lower Extremity Angiography;  Surgeon: Elam Dutch, MD;  Location: [place];  Service: Cardiovascular;  Laterality: Bilateral;  . Abdominal hysterectomy      partial  . Amputation Left 01/29/2015    Procedure: Left  AMPUTATION BELOW KNEE;  Surgeon: Elam Dutch, MD;  Location: [place];  Service: Vascular;  Laterality: Left;    There were no vitals filed for this visit.  Visit Diagnosis:  Impaired functional mobility and activity tolerance  Abnormality of gait  Encounter for prosthetic gait training  Balance problems  Unsteadiness on feet  Weakness  generalized      Subjective Assessment - 08/08/15 0806    Subjective She saw Dr. Janett Billow Copland who reports continue to monitor right now. She needs to limit weight on right foot to protect it. Patient reports difficulty with prosthesis with pad placement putting pressure on bone. No falls. Patient reports only pain in "awkward positions" where bone creates too much pressure on prosthesis.   Patient is accompained by: Family member   Pertinent History PVD, HTN, Arthritis, uterine CA, knee surgery, spine surgery   Patient Stated Goals She wants to use prosthesis to resume active lifestyle (clean house including heavy duty) go outside to flower garden, yard work.    Currently in Pain? No/denies          [facility] Adult PT Treatment/Exercise - 08/08/15 0810    Ambulation/Gait   Ambulation/Gait Yes   Ambulation/Gait Assistance 5: Supervision   Ambulation/Gait Assistance Details Verbal cues for increased step length and weight shift.   Ambulation Distance (Feet) 115 Feet   Assistive device Rollator;Prosthesis   Gait Pattern Step-through pattern;Decreased arm swing - left;Decreased step length - right;Decreased stance time - left;Decreased stride length;Decreased hip/knee flexion - left;Decreased weight shift to left;Right flexed knee in stance;Left flexed knee in stance;Antalgic;Abducted - left;Trunk flexed;Poor foot clearance - left   Ambulation Surface Level;Indoor   Ramp 5: Supervision   Ramp Details (indicate cue type and reason) x2 reps with verbal cues to keep rollator brakes on for duration of ramp/curb trial. Patient verbalized increased  comfort with change and appeared less guarded.   Curb 5: Supervision   Curb Details (indicate cue type and reason) x2 with verbal cues to keep rollator bakes on for duration of trials. Patient verbalized increased comfort after change and appeared less guarded.   Gait Comments Patient appears more comfortable with rollator and verbalizes confidence in  ability to negotiate barriers with rollator, more so after instruction with brakes on barriers. Patient still has slight hesitation to weight shift during gait, despite cues. Patient has been advised by MD to limit weight on RLE which may affect patient's hesitation.   Neuro Re-ed    Neuro Re-ed Details  Corner exercises feet together on floor eyes closed head movement up/down, left/right, diagonals x 10 reps each with supervision with no LOB noted.   Prosthetics   Prosthetic Care Comments  Patient has increased pain in residual limb on occasion. Callous on RLE healing well.   Current prosthetic wear tolerance (days/week)  daily   Current prosthetic wear tolerance (#hours/day)  wearing all awake hours, drying q4 hours/prn   Residual limb condition  2 small areas that are possible areas of sutures working out, no open areas. Patient notes increased pressure lateral to midline of anterior residual limb where pads contact. Mild redness noted in area.   Education Provided Skin check;Residual limb care;Care of non-amputated limb  Callous on RLE healing well.   Person(s) Educated Patient;Other (comment)  Grandson   Education Method Explanation;Verbal cues   Education Method Verbalized understanding;Returned demonstration   Donning Prosthesis Supervision   Doffing Prosthesis Modified independent (device/increased time)          PT Education - 08/08/15 0844    Education provided Yes   Education Details Issued corner exercises to HEP. See Patient Instructions for details.   Person(s) Educated Patient;Other (comment)  Grandson (adult)   Methods Explanation;Demonstration;Handout;Verbal cues   Comprehension Verbalized understanding;Returned demonstration;Verbal cues required          PT Short Term Goals - 07/31/15 1244    PT SHORT TERM GOAL #1   Title Patient demonstrates understanding of prosthetic care with minimal cues required. (Target Date 06/15/2015)   Baseline MET 06/13/2015   Status  Achieved   PT SHORT TERM GOAL #2   Title Patient tolerates wear of prosthesis >12hrs total /day with no negative changes to wound & no tenderness on limb.  (Target Date 06/15/2015)   Baseline Partially MET 06/13/2015  Patient tolerating wear up to 10 hrs total /day with no negative changes to wound but tenderness in limb that appears to be improving.    Status Partially Met   PT SHORT TERM GOAL #3   Title Patient reaches 10", rotates shoulders to look behind her and picks up objects from floor without UE support with supervision.  (Target Date 06/15/2015)   Baseline MET 06/13/2015   Status Achieved   PT SHORT TERM GOAL #4   Title Patient ambulates 200' with single point cane & prosthesis with supervision.  (Target Date 06/15/2015)   Baseline 06/15/15: pt able to ambulate the distance, still needing min guard to min assist for balance   Status Partially Met   PT SHORT TERM GOAL #5   Title Patient negotiates ramps, curbs & stairs (1 rail) with single point cane & prosthesis with minimal assist.  (Target Date 06/15/2015)   Baseline met on 06/15/15   Status Achieved   PT SHORT TERM GOAL #6   Title Patient tolerates wear >90% of awake hours with limb  pain </= 4/10.  TARGET DATE 08/10/2015   Time 4   Period Weeks   Status On-going   PT SHORT TERM GOAL #7   Title Patient ambulates 250' with LRAD & prosthesis with supervision.  TARGET DATE 08/10/2015   Time 4   Period Weeks   Status On-going   PT SHORT TERM GOAL #8   Title Patient negotiates ramps, curbs & stairs (1 rail) with LRAD & prosthesis with supervision.  TARGET DATE 08/10/2015   Time 4   Period Weeks   Status On-going          PT Long Term Goals - 07/31/15 1244    PT LONG TERM GOAL #1   Title Patient demonstrates / verbalizes safe, independent prosthetic care to enable prosthesis use without issues.  (Target Date 07/13/2015) NEW TARGET DATE 09/07/2015   Baseline 07/16/2015 NOT MET patient requires cues to adjust ply socks and manage pain in residual  limb at distal tibia.    Time 8   Period Weeks   Status On-going   PT LONG TERM GOAL #2   Title Patient tolerates wear of prosthesis >90% of awake hours without skin issues or limb tenderness to enable function throughout her day.  (Target Date 07/13/2015)  NEW TARGET DATE 09/07/2015   Baseline NOT MET 07/16/2015 Patient is wearing all awake hours but has one area with sutures working way out & distal tibia pain 7/10.    Time 8   Period Weeks   Status On-going   PT LONG TERM GOAL #3   Title Berg Balance >45/56 with prosthesis to reduce rall risk.  (Target Date 07/13/2015)  NEW TARGET DATE 09/07/2015   Baseline NOT MET 07/11/2015  Berg Balance 41/56   Time 8   Period Weeks   Status On-going   PT LONG TERM GOAL #4   Title Timed Up & Go with prosthesis & cane <13.5 sec to indicate lower fall risk.  (Target Date 07/13/2015)  NEW TARGET DATE 09/07/2015   Baseline NOT RETESTED   Time 8   Period Weeks   Status Revised   PT LONG TERM GOAL #5   Title Patient ambulates 100' around furniture & uses rollator to carry laundry basket with prosthesis modified indepedent.  (Target Date 07/13/2015)  NEW TARGET DATE 09/07/2015   Baseline NOT MET 07/16/2015 Limb is too painful to ambulate without device yet.    Time 8   Period Weeks   Status Revised   PT LONG TERM GOAL #6   Title Patient amulates 40' with single point cane around household furniture with prosthesis modified independent.  (Target Date 07/13/2015)  NEW TARGET DATE 09/07/2015   Baseline NOT MET 07/16/2015 Patient ambulates 100' with single point cane inside level surfaces with minimal guard.    Time 8   Period Weeks   Status Revised          Plan - 08/08/15 1006    Clinical Impression Statement Skilled session addressing prosthetic care and deficits with gait including barriers with rollator and prosthesis. Patient able to negotiate ramp and curb with supervision and verbalizes increased confidence and comfort with rollator after instruction on brakes  with barriers. Patient's limitation of placing weight on RLE and therefore limiting cane use has slowed progress some, but patient is still making steady progress toward goals.   Pt will benefit from skilled therapeutic intervention in order to improve on the following deficits Abnormal gait;Decreased activity tolerance;Decreased balance;Decreased endurance;Decreased skin integrity;Decreased range of motion;Decreased mobility;Decreased strength;Increased  edema;Postural dysfunction;Prosthetic Dependency   Rehab Potential Good   PT Frequency 2x / week   PT Duration 8 weeks   PT Treatment/Interventions ADLs/Self Care Home Management;DME Instruction;Gait training;Stair training;Functional mobility training;Therapeutic activities;Therapeutic exercise;Balance training;Neuromuscular re-education;Patient/family education;Prosthetic Training   PT Next Visit Plan Assess right foot & residual limb. Gait with rollator for community ambulation.Planned discharge next session with primary PT to see pt at that visit..   PT Home Exercise Plan 08/08/15 Added Corner Exercises to HEP.   Consulted and Agree with Plan of Care Patient;Family member/caregiver   Family Member Consulted grandson (adult)      Problem List Patient Active Problem List   Diagnosis Date Noted  . Loss or death of child 06/25/15  . Status post below knee amputation of left lower extremity ([place]) 01/31/2015  . Non-healing wound of lower extremity 01/29/2015  . Ulcer of toe of left foot ([place]) 12/27/2014  . Essential hypertension 08/22/2014    [place], [place] 08/08/2015, 10:12 AM  [facility] [address] [place], [name], [address] Phone: [phone]   Fax:  [phone]  Name: [name] MRN: [id number] Date of Birth: [birth year]  This note has been reviewed and edited by supervising CI.  Willow Ora, PTA, [facility] [address],  [place], [place] [address] [phone] 08/08/2015, 3:52 PM

## 2015-08-16 ENCOUNTER — Ambulatory Visit: Payer: Medicare Other | Attending: Physical Medicine & Rehabilitation | Admitting: Physical Therapy

## 2015-08-16 ENCOUNTER — Encounter: Payer: Self-pay | Admitting: Physical Therapy

## 2015-08-16 DIAGNOSIS — M6281 Muscle weakness (generalized): Secondary | ICD-10-CM | POA: Insufficient documentation

## 2015-08-16 DIAGNOSIS — R2681 Unsteadiness on feet: Secondary | ICD-10-CM | POA: Diagnosis not present

## 2015-08-16 DIAGNOSIS — R2689 Other abnormalities of gait and mobility: Secondary | ICD-10-CM | POA: Diagnosis not present

## 2015-08-17 NOTE — Therapy (Signed)
[facility] [address] [place], [name], [address] Phone: [phone]   Fax:  [phone]  Physical Therapy Treatment  Patient Details  Name: [name] MRN: [id number] Date of Birth: 08/27/1931 Referring Provider: Alger Simons, MD  Encounter Date: 08/16/2015      PT End of Session - 08/16/15 0845    Visit Number 23   Number of Visits 33   Date for PT Re-Evaluation 09/07/15   Authorization Type Medicare G-Code & progress report every 10 visits   PT Start Time 0801   PT Stop Time 0844   PT Time Calculation (min) 43 min   Equipment Utilized During Treatment Gait belt   Activity Tolerance Patient tolerated treatment well;Patient limited by pain   Behavior During Therapy [facility] for tasks assessed/performed      Past Medical History  Diagnosis Date  . Hypertension   . PAD (peripheral artery disease) ([place])   . Arthritis   . Cancer ([place])     utertrine    Past Surgical History  Procedure Laterality Date  . Spine surgery    . Knee surgery Right 1984  . Peripheral vascular catheterization N/A 09/15/2014    Procedure: Abdominal Aortogram;  Surgeon: Elam Dutch, MD;  Location: [facility] INVASIVE CV LAB CUPID;  Service: Cardiovascular;  Laterality: N/A;  . Peripheral vascular catheterization Bilateral 09/15/2014    Procedure: Lower Extremity Angiography;  Surgeon: Elam Dutch, MD;  Location: [place];  Service: Cardiovascular;  Laterality: Bilateral;  . Abdominal hysterectomy      partial  . Amputation Left 01/29/2015    Procedure: Left  AMPUTATION BELOW KNEE;  Surgeon: Elam Dutch, MD;  Location: [facility];  Service: Vascular;  Laterality: Left;    There were no vitals filed for this visit.      Subjective Assessment - 08/16/15 0800    Subjective (p) She has not seen prosthetist to move pads yet.    Patient is accompained by: (p) Family member   Pertinent History (p) PVD, HTN, Arthritis,  uterine CA, knee surgery, spine surgery   Patient Stated Goals (p) She wants to use prosthesis to resume active lifestyle (clean house including heavy duty) go outside to flower garden, yard work.             [facility] PT Assessment - 08/16/15 0845    Berg Balance Test   Sit to Stand Able to stand  independently using hands   Standing Unsupported Able to stand safely 2 minutes   Sitting with Back Unsupported but Feet Supported on Floor or Stool Able to sit safely and securely 2 minutes   Stand to Sit Sits safely with minimal use of hands   Transfers Able to transfer safely, minor use of hands   Standing Unsupported with Eyes Closed Able to stand 10 seconds safely   Standing Ubsupported with Feet Together Able to place feet together independently and stand 1 minute safely   From Standing, Reach Forward with Outstretched Arm Can reach forward >12 cm safely (5")   From Standing Position, Pick up Object from Floor Able to pick up shoe safely and easily   From Standing Position, Turn to Look Behind Over each Shoulder Looks behind one side only/other side shows less weight shift   Turn 360 Degrees Able to turn 360 degrees safely but slowly   Standing Unsupported, Alternately Place Feet on Step/Stool Able to complete >2 steps/needs minimal assist   Standing Unsupported, One  Foot in [place] to take small step independently and hold 30 seconds   Standing on One Leg Tries to lift leg/unable to hold 3 seconds but remains standing independently   Total Score 43         Prosthetics Assessment - 08/16/15 0845    Prosthetics   Prosthetic Care Independent with Skin check;Residual limb care;Care of non-amputated limb;Prosthetic cleaning;Ply sock cleaning;Correct ply sock adjustment;Proper wear schedule/adjustment;Proper weight-bearing schedule/adjustment   Donning prosthesis  Modified independent (Device/Increase time)   Doffing prosthesis  Modified independent (Device/Increase time)                     [facility] Adult PT Treatment/Exercise - 08/16/15 0845    Transfers   Transfers Sit to Stand;Stand to Sit   Sit to Stand 6: Modified independent (Device/Increase time);From chair/3-in-1;With upper extremity assist  from rollator walker seat   Stand to Sit 6: Modified independent (Device/Increase time);With upper extremity assist;To chair/3-in-1  to rollator walker seat   Ambulation/Gait   Ambulation/Gait Yes   Ambulation/Gait Assistance 6: Modified independent (Device/Increase time)   Ambulation Distance (Feet) 250 Feet  250' X 2   Assistive device Rollator;Prosthesis   Gait Pattern Step-through pattern;Decreased arm swing - left;Decreased step length - right;Decreased stance time - left;Decreased stride length;Decreased hip/knee flexion - left;Decreased weight shift to left;Right flexed knee in stance;Left flexed knee in stance;Antalgic;Abducted - left;Trunk flexed;Poor foot clearance - left   Ambulation Surface Indoor;Level;Outdoor;Paved   Gait velocity 2.44 ft/sec   Stairs Yes   Stairs Assistance 6: Modified independent (Device/Increase time)   Stair Management Technique One rail Left;Step to pattern;Forwards   Number of Stairs 4   Ramp 6: Modified independent (Device)  rollator walker & prosthesis   Curb 6: Modified independent (Device/increase time)  rollator walker & prosthesis   Gait Comments Patient verbalizes ability to move laundry, plates, objects for ADLs / household mobility.    Neuro Re-ed    Neuro Re-ed Details  Corner exercises feet together on floor eyes closed head movement up/down, left/right, diagonals x 10 reps each with supervision with no LOB noted.   Prosthetics   Prosthetic Care Comments  PT called prosthetist to recommend lowering pre-tibial pads to not contact tibial plateau.   Current prosthetic wear tolerance (days/week)  daily   Current prosthetic wear tolerance (#hours/day)  >90% of awake hours   Residual limb condition  no open  areas but small suture working out.    Education Provided --   Donning Prosthesis Modified independent (device/increased time)   Doffing Prosthesis Modified independent (device/increased time)                  PT Short Term Goals - 07/31/15 1244    PT SHORT TERM GOAL #1   Title Patient demonstrates understanding of prosthetic care with minimal cues required. (Target Date 06/15/2015)   Baseline MET 06/13/2015   Status Achieved   PT SHORT TERM GOAL #2   Title Patient tolerates wear of prosthesis >12hrs total /day with no negative changes to wound & no tenderness on limb.  (Target Date 06/15/2015)   Baseline Partially MET 06/13/2015  Patient tolerating wear up to 10 hrs total /day with no negative changes to wound but tenderness in limb that appears to be improving.    Status Partially Met   PT SHORT TERM GOAL #3   Title Patient reaches 10", rotates shoulders to look behind her and picks up objects from floor without UE support with supervision.  (  Target Date 06/15/2015)   Baseline MET 06/13/2015   Status Achieved   PT SHORT TERM GOAL #4   Title Patient ambulates 200' with single point cane & prosthesis with supervision.  (Target Date 06/15/2015)   Baseline 06/15/15: pt able to ambulate the distance, still needing min guard to min assist for balance   Status Partially Met   PT SHORT TERM GOAL #5   Title Patient negotiates ramps, curbs & stairs (1 rail) with single point cane & prosthesis with minimal assist.  (Target Date 06/15/2015)   Baseline met on 06/15/15   Status Achieved   PT SHORT TERM GOAL #6   Title Patient tolerates wear >90% of awake hours with limb pain </= 4/10.  TARGET DATE 08/10/2015   Time 4   Period Weeks   Status On-going   PT SHORT TERM GOAL #7   Title Patient ambulates 250' with LRAD & prosthesis with supervision.  TARGET DATE 08/10/2015   Time 4   Period Weeks   Status On-going   PT SHORT TERM GOAL #8   Title Patient negotiates ramps, curbs & stairs (1 rail) with LRAD &  prosthesis with supervision.  TARGET DATE 08/10/2015   Time 4   Period Weeks   Status On-going           PT Long Term Goals - 08/16/15 0845    PT LONG TERM GOAL #1   Title Patient demonstrates / verbalizes safe, independent prosthetic care to enable prosthesis use without issues.  (Target Date 07/13/2015) NEW TARGET DATE 09/07/2015   Baseline MET 08/16/2015   Time 8   Period Weeks   Status Achieved   PT LONG TERM GOAL #2   Title Patient tolerates wear of prosthesis >90% of awake hours without skin issues or limb tenderness to enable function throughout her day.  (Target Date 07/13/2015)  NEW TARGET DATE 09/07/2015   Baseline MET 08/16/2015   Time 8   Period Weeks   Status Achieved   PT LONG TERM GOAL #3   Title Berg Balance >45/56 with prosthesis to reduce rall risk.  (Target Date 07/13/2015)  NEW TARGET DATE 09/07/2015   Baseline Partial MET 08/16/2015  Berg Balance 43/56 improved but not quite to goal of >/=45/56   Time 8   Period Weeks   Status Partially Met   PT LONG TERM GOAL #4   Title Timed Up & Go with prosthesis & cane <13.5 sec to indicate lower fall risk.  (Target Date 07/13/2015)  NEW TARGET DATE 09/07/2015   Baseline goal deferred due to limited gait without rollator to protect non-amputated foot   Time 8   Period Weeks   Status Deferred   PT LONG TERM GOAL #5   Title Patient ambulates 100' around furniture & uses rollator to carry laundry basket with prosthesis modified indepedent.  (Target Date 07/13/2015)  NEW TARGET DATE 09/07/2015   Baseline MET 08/16/2015   Time 8   Period Weeks   Status Achieved   PT LONG TERM GOAL #6   Title Patient amulates 29' with single point cane around household furniture with prosthesis modified independent.  (Target Date 07/13/2015)  NEW TARGET DATE 09/07/2015   Baseline 08/16/2015 Patient only ambulates short distances within room like ktichen with cane to protect right foot.    Time 8   Period Weeks   Status Deferred               Plan -  08/16/15 0845    Clinical  Impression Statement Patient met or partially met 4 LTGS. 2 LTGs were discontinued due to using walker to limit stress on her non-amputated foot. Patient reports pleased with progress and mobility.    Rehab Potential Good   PT Frequency 2x / week   PT Duration 8 weeks   PT Treatment/Interventions ADLs/Self Care Home Management;DME Instruction;Gait training;Stair training;Functional mobility training;Therapeutic activities;Therapeutic exercise;Balance training;Neuromuscular re-education;Patient/family education;Prosthetic Training   PT Next Visit Plan discharge   Consulted and Agree with Plan of Care Patient;Family member/caregiver   Family Member Consulted grandson (adult)      Patient will benefit from skilled therapeutic intervention in order to improve the following deficits and impairments:  Abnormal gait, Decreased activity tolerance, Decreased balance, Decreased endurance, Decreased skin integrity, Decreased range of motion, Decreased mobility, Decreased strength, Increased edema, Postural dysfunction, Prosthetic Dependency  Visit Diagnosis: Other abnormalities of gait and mobility  Unsteadiness on feet  Muscle weakness (generalized)       G-Codes - 08-24-15 0845    Functional Assessment Tool Used Wearing prosthesis >90% of awake hours without issues. Patient verbalizes and demonstrates safe prosthetic care & use.    Functional Limitation Self care   Self Care Goal Status [phone]) At least 1 percent but less than 20 percent impaired, limited or restricted   Self Care Discharge Status [phone]) At least 1 percent but less than 20 percent impaired, limited or restricted      Problem List Patient Active Problem List   Diagnosis Date Noted  . Loss or death of child 06/10/2015  . Status post below knee amputation of left lower extremity ([place]) 01/31/2015  . Non-healing wound of lower extremity 01/29/2015  . Ulcer of toe of left foot ([place]) 12/27/2014  .  Essential hypertension 08/22/2014    Courtland Reas PT, DPT 08/17/2015, 8:32 AM  [facility] [address] [place], [name], [address] Phone: [phone]   Fax:  [phone]  Name: JERAE IZARD MRN: [id number] Date of Birth: March 30, 1932  PHYSICAL THERAPY DISCHARGE SUMMARY  Visits from Start of Care: 23  Current functional level related to goals / functional outcomes: See above   Remaining deficits: See above   Education / Equipment: Prosthetic care /use & HEP, diabetic foot care  Plan: Patient agrees to discharge.  Patient goals were partially met. Patient is being discharged due to meeting the stated rehab goals.  ?????

## 2015-08-27 ENCOUNTER — Telehealth: Payer: Self-pay | Admitting: Family Medicine

## 2015-08-27 NOTE — Telephone Encounter (Signed)
Can be reached: [phone]   Reason for call: [facility] her other prosthetic and needs RX from Dr. Lorelei Pont. It is the final prosthetic for her left leg .

## 2015-08-27 NOTE — Telephone Encounter (Signed)
Called and spoke with Belenda Cruise- we are not sure just what the rx needs to say. I called the [facility] at [phone] but her contact person Gerald Stabs is not in today. He will be back Wednesday. Will try him back later

## 2015-08-29 NOTE — Telephone Encounter (Signed)
Spoke with [name]- he needs an rx for a "new prosthesis" to get process started and then will send me a more detailed rx Fax to [phone]

## 2015-09-17 ENCOUNTER — Telehealth: Payer: Self-pay | Admitting: *Deleted

## 2015-09-17 NOTE — Telephone Encounter (Signed)
Received Prescription-Letter/CMN with chart note attached; forwarded to provider/SLS 05/08

## 2016-03-05 DIAGNOSIS — Z23 Encounter for immunization: Secondary | ICD-10-CM | POA: Diagnosis not present

## 2016-03-31 ENCOUNTER — Ambulatory Visit (INDEPENDENT_AMBULATORY_CARE_PROVIDER_SITE_OTHER): Payer: Medicare Other | Admitting: Family Medicine

## 2016-03-31 ENCOUNTER — Encounter: Payer: Self-pay | Admitting: Family Medicine

## 2016-03-31 VITALS — BP 115/80 | HR 69 | Temp 97.8°F | Ht 67.0 in | Wt 138.0 lb

## 2016-03-31 DIAGNOSIS — I10 Essential (primary) hypertension: Secondary | ICD-10-CM | POA: Diagnosis not present

## 2016-03-31 DIAGNOSIS — Z23 Encounter for immunization: Secondary | ICD-10-CM

## 2016-03-31 DIAGNOSIS — Z13220 Encounter for screening for lipoid disorders: Secondary | ICD-10-CM

## 2016-03-31 DIAGNOSIS — I70209 Unspecified atherosclerosis of native arteries of extremities, unspecified extremity: Secondary | ICD-10-CM

## 2016-03-31 DIAGNOSIS — R7309 Other abnormal glucose: Secondary | ICD-10-CM

## 2016-03-31 DIAGNOSIS — J0110 Acute frontal sinusitis, unspecified: Secondary | ICD-10-CM

## 2016-03-31 DIAGNOSIS — Z5181 Encounter for therapeutic drug level monitoring: Secondary | ICD-10-CM | POA: Diagnosis not present

## 2016-03-31 LAB — COMPREHENSIVE METABOLIC PANEL
ALT: 14 U/L (ref 0–35)
AST: 17 U/L (ref 0–37)
Albumin: 4.2 g/dL (ref 3.5–5.2)
Alkaline Phosphatase: 64 U/L (ref 39–117)
BUN: 9 mg/dL (ref 6–23)
CALCIUM: 9.4 mg/dL (ref 8.4–10.5)
CHLORIDE: 97 meq/L (ref 96–112)
CO2: 30 meq/L (ref 19–32)
CREATININE: 0.42 mg/dL (ref 0.40–1.20)
GFR: 153.41 mL/min (ref >60.00)
Glucose, Bld: 85 mg/dL (ref 70–99)
Potassium: 4 mEq/L (ref 3.5–5.1)
SODIUM: 132 meq/L — AB (ref 135–145)
Total Bilirubin: 0.4 mg/dL (ref 0.2–1.2)
Total Protein: 6.8 g/dL (ref 6.0–8.3)

## 2016-03-31 LAB — CBC
HEMATOCRIT: 41.3 % (ref 36.0–46.0)
Hemoglobin: 13.8 g/dL (ref 12.0–15.0)
MCHC: 33.3 g/dL (ref 30.0–36.0)
MCV: 91.3 fl (ref 78.0–100.0)
Platelets: 210 10*3/uL (ref 150.0–400.0)
RBC: 4.53 Mil/uL (ref 3.87–5.11)
RDW: 13.9 % (ref 11.5–15.5)
WBC: 6.1 10*3/uL (ref 4.0–10.5)

## 2016-03-31 LAB — LIPID PANEL
CHOL/HDL RATIO: 3
CHOLESTEROL: 121 mg/dL (ref 0–200)
HDL: 48.1 mg/dL (ref >39.00)
LDL CALC: 60 mg/dL (ref 0–99)
NonHDL: 72.91
Triglycerides: 65 mg/dL (ref 0.0–149.0)
VLDL: 13 mg/dL (ref 0.0–40.0)

## 2016-03-31 MED ORDER — AMOXICILLIN 500 MG PO CAPS: 1000.0000 mg | ORAL_CAPSULE | Freq: Two times a day (BID) | ORAL | 0 refills | Status: DC

## 2016-03-31 NOTE — Patient Instructions (Signed)
You got your pneumonia shot booster today I will be in touch with your labs We are going to treat you for a sinus infection with amoxicillin- take 2 pills twice daily for 10 days Let's plan to visit in 6 months unless you have any concerns in the meantime

## 2016-03-31 NOTE — Progress Notes (Signed)
Pre visit review using our clinic review tool, if applicable. No additional management support is needed unless otherwise documented below in the visit note. 

## 2016-03-31 NOTE — Progress Notes (Addendum)
[place] at [facility] [address], [place], [name] [address] [id number] [phone]  Date:  03/31/2016   Name:  [name]   DOB:  [birth year]   MRN:  [id number]  PCP:  Lamar Blinks, MD    Chief Complaint: Follow-up (Pt here for 6 month follow up visit. No new concerns at this time. )   History of Present Illness:  [name] is a 82 y.o. very pleasant female patient who presents with the following:  Here today for a periodic recheck.  Last seen here in March of this year- partial HPI from that visit History of severe PAD s/p left BKA.  Her PT was concerned about the appearance of her right foot and wanted her to be seen.  Pt states that she does not have any pain or numbness in her foot, no change in the appearance of the foot. It will be quite red when dependent but blanches when raised.  She does not have any concerns herself but came in because she was asked to.   She is doing well with her walker but has more pain when she tried to use just a cane.  Overall she has done well with her left BKA. She knows that she will probably need the same on the right eventually   Flu shot done this year.   She notes that she did seem to have onset of a possible sinus infection about 3 weeks ago.  She has used some mucinex. But does continue to have symptoms of nasal and sinus pressure, PND, cough.   No fever noted.  Her throat was sore but this is now better.  She is a never smoker-   She ate about 6 hours ago Her BP at home has been well controlled- it was 132/ 70s this at at home.   She does monitor this regularly at home BP Readings from Last 3 Encounters:  03/31/16 (!) 145/67  07/25/15 138/72  2015-06-20 (!) 158/72     Patient Active Problem List   Diagnosis Date Noted  . Loss or death of child 06/20/2015  . Status post below knee amputation of left lower extremity ([place]) 01/31/2015  . Non-healing wound of lower extremity  01/29/2015  . Ulcer of toe of left foot ([place]) 12/27/2014  . Essential hypertension 08/22/2014    Past Medical History:  Diagnosis Date  . Arthritis   . Cancer ([place])    utertrine  . Hypertension   . PAD (peripheral artery disease) ([facility])     Past Surgical History:  Procedure Laterality Date  . ABDOMINAL HYSTERECTOMY     partial  . AMPUTATION Left 01/29/2015   Procedure: Left  AMPUTATION BELOW KNEE;  Surgeon: Elam Dutch, MD;  Location: [facility];  Service: Vascular;  Laterality: Left;  . KNEE SURGERY Right 1984  . PERIPHERAL VASCULAR CATHETERIZATION N/A 09/15/2014   Procedure: Abdominal Aortogram;  Surgeon: Elam Dutch, MD;  Location: [facility] INVASIVE CV LAB CUPID;  Service: Cardiovascular;  Laterality: N/A;  . PERIPHERAL VASCULAR CATHETERIZATION Bilateral 09/15/2014   Procedure: Lower Extremity Angiography;  Surgeon: Elam Dutch, MD;  Location: [place] INVASIVE CV LAB CUPID;  Service: Cardiovascular;  Laterality: Bilateral;  . SPINE SURGERY      Social History  Substance Use Topics  . Smoking status: Never Smoker  . Smokeless tobacco: Never Used  . Alcohol use No    Family History  Problem Relation Age of Onset  .  Diabetes Mother   . Heart disease Mother   . Dementia Sister   . Diabetes Son     No Known Allergies  Medication list has been reviewed and updated.  Current Outpatient Prescriptions on File Prior to Visit  Medication Sig Dispense Refill  . clopidogrel (PLAVIX) 75 MG tablet Take 1 tablet (75 mg total) by mouth daily. 90 tablet 3  . hydrochlorothiazide (HYDRODIURIL) 25 MG tablet Take 1 tablet (25 mg total) by mouth daily. 90 tablet 3  . omeprazole (PRILOSEC) 20 MG capsule Take 20 mg by mouth daily.    Vladimir Faster Glycol-Propyl Glycol (SYSTANE FREE OP) Place 1 drop into both eyes daily. For dry eyes     No current facility-administered medications on file prior to visit.     Review of Systems:  As per HPI- otherwise negative.   Physical  Examination: Vitals:   03/31/16 1036 03/31/16 1040  BP: (!) 154/88 (!) 145/67  Pulse: 69   Temp: 97.8 F (36.6 C)    Vitals:   03/31/16 1036  Weight: 138 lb (62.6 kg)  Height: 5\' 7"  (1.702 m)   Body mass index is 21.61 kg/m. Ideal Body Weight: Weight in (lb) to have BMI = 25: 159.3  GEN: WDWN, NAD, Non-toxic, A & O x 3, looks well HEENT: Atraumatic, Normocephalic. Neck supple. No masses, No LAD.  Bilateral TM wnl, oropharynx normal.  PEERL,EOMI.   Nasal cavity is inflamed with discharge Ears and Nose: No external deformity. CV: RRR, No M/G/R. No JVD. No thrill. No extra heart sounds. PULM: CTA B, no wheezes, crackles, rhonchi. No retractions. No resp. distress. No accessory muscle use. EXTR: No c/c/e- left BKA NEURO Normal gait for pt, she does use a walker for support PSYCH: Normally interactive. Conversant. Not depressed or anxious appearing.  Calm demeanor.    Assessment and Plan: Acute non-recurrent frontal sinusitis - Plan: amoxicillin (AMOXIL) 500 MG capsule  Immunization due - Plan: Pneumococcal conjugate vaccine 13-valent IM  Medication monitoring encounter - Plan: Comprehensive metabolic panel, CBC  Screening for hyperlipidemia - Plan: Lipid panel  Essential hypertension - Plan: Lipid panel  Elevated glucose - Plan: Lipid panel  Atherosclerotic peripheral vascular disease ([place]) - Plan: Lipid panel  She is due for prevnar 13- will give today Flu shot done for the year Treat with amoxicillin for 3 weeks of sinus infection sx Otherwise labs are pending and I will be in touch with these results She will continue to monitor her BP at home, continue hctz at 25 mg She is on plavix for PVD Plan follow-up in 6 months  Signed Lamar Blinks, MD  Received her labs 11/24, called.  Would like to change her BP med to lisinopril10/hctz12.5 due to mild hyponatremia.  No answer, will try back  Results for orders placed or performed in visit on 03/31/16  Comprehensive  metabolic panel  Result Value Ref Range   Sodium 132 (L) 135 - 145 mEq/L   Potassium 4.0 3.5 - 5.1 mEq/L   Chloride 97 96 - 112 mEq/L   CO2 30 19 - 32 mEq/L   Glucose, Bld 85 70 - 99 mg/dL   BUN 9 6 - 23 mg/dL   Creatinine, Ser 0.42 0.40 - 1.20 mg/dL   Total Bilirubin 0.4 0.2 - 1.2 mg/dL   Alkaline Phosphatase 64 39 - 117 U/L   AST 17 0 - 37 U/L   ALT 14 0 - 35 U/L   Total Protein 6.8 6.0 - 8.3 g/dL  Albumin 4.2 3.5 - 5.2 g/dL   Calcium 9.4 8.4 - 10.5 mg/dL   GFR 153.41 >60.00 mL/min  CBC  Result Value Ref Range   WBC 6.1 4.0 - 10.5 K/uL   RBC 4.53 3.87 - 5.11 Mil/uL   Platelets 210.0 150.0 - 400.0 K/uL   Hemoglobin 13.8 12.0 - 15.0 g/dL   HCT 41.3 36.0 - 46.0 %   MCV 91.3 78.0 - 100.0 fl   MCHC 33.3 30.0 - 36.0 g/dL   RDW 13.9 11.5 - 15.5 %  Lipid panel  Result Value Ref Range   Cholesterol 121 0 - 200 mg/dL   Triglycerides 65.0 0.0 - 149.0 mg/dL   HDL 48.10 >39.00 mg/dL   VLDL 13.0 0.0 - 40.0 mg/dL   LDL Cholesterol 60 0 - 99 mg/dL   Total CHOL/HDL Ratio 3    NonHDL 72.91    Called pt on 11/25- we will change her BPmedication to lisinopril/hctz 10/12.5.  She will come in for a BMP in 2-3 weeks to followup on her sodium

## 2016-04-05 ENCOUNTER — Encounter: Payer: Self-pay | Admitting: Family Medicine

## 2016-04-05 MED ORDER — LISINOPRIL-HYDROCHLOROTHIAZIDE 10-12.5 MG PO TABS: 1.0000 | ORAL_TABLET | Freq: Every day | ORAL | 6 refills | Status: DC

## 2016-04-05 NOTE — Addendum Note (Signed)
Addended by: Lamar Blinks C on: 04/05/2016 10:32 AM   Modules accepted: Orders

## 2016-04-22 ENCOUNTER — Other Ambulatory Visit (INDEPENDENT_AMBULATORY_CARE_PROVIDER_SITE_OTHER): Payer: Medicare Other

## 2016-04-22 ENCOUNTER — Encounter: Payer: Self-pay | Admitting: Family Medicine

## 2016-04-22 DIAGNOSIS — I10 Essential (primary) hypertension: Secondary | ICD-10-CM | POA: Diagnosis not present

## 2016-04-22 LAB — BASIC METABOLIC PANEL
BUN: 12 mg/dL (ref 6–23)
CALCIUM: 9.4 mg/dL (ref 8.4–10.5)
CO2: 28 meq/L (ref 19–32)
Chloride: 101 mEq/L (ref 96–112)
Creatinine, Ser: 0.48 mg/dL (ref 0.40–1.20)
GFR: 131.48 mL/min (ref >60.00)
GLUCOSE: 97 mg/dL (ref 70–99)
POTASSIUM: 4.2 meq/L (ref 3.5–5.1)
SODIUM: 135 meq/L (ref 135–145)

## 2016-05-01 ENCOUNTER — Other Ambulatory Visit: Payer: Self-pay | Admitting: Family Medicine

## 2016-05-01 ENCOUNTER — Other Ambulatory Visit: Payer: Self-pay | Admitting: Emergency Medicine

## 2016-05-01 DIAGNOSIS — I10 Essential (primary) hypertension: Secondary | ICD-10-CM

## 2016-05-01 DIAGNOSIS — R609 Edema, unspecified: Secondary | ICD-10-CM

## 2016-05-01 DIAGNOSIS — I739 Peripheral vascular disease, unspecified: Secondary | ICD-10-CM

## 2016-05-01 MED ORDER — CLOPIDOGREL BISULFATE 75 MG PO TABS: 75.0000 mg | ORAL_TABLET | Freq: Every day | ORAL | 3 refills | Status: DC

## 2016-05-01 NOTE — Telephone Encounter (Signed)
Refilled her plavix- called her to ask about her linsinopril hctz- she does not mention wanting to come off this med, she wants to continue it. May have been a misunderstanding

## 2016-05-01 NOTE — Telephone Encounter (Signed)
Received refill request for clopidogrel (PLAVIX) 75 MG tablet and hydrochlorothiazide. Last office visit 03/31/16. Last refill on clopidogrel (PLAVIX) was 05/2015. Pt currently has lisinopril-hctz but is request only hctz. Is it ok to refill? Please advise.

## 2016-06-06 ENCOUNTER — Other Ambulatory Visit: Payer: Self-pay | Admitting: Emergency Medicine

## 2016-06-06 DIAGNOSIS — I10 Essential (primary) hypertension: Secondary | ICD-10-CM

## 2016-06-06 MED ORDER — LISINOPRIL-HYDROCHLOROTHIAZIDE 10-12.5 MG PO TABS: 1.0000 | ORAL_TABLET | Freq: Every day | ORAL | 1 refills | Status: DC

## 2016-09-29 ENCOUNTER — Ambulatory Visit (INDEPENDENT_AMBULATORY_CARE_PROVIDER_SITE_OTHER): Payer: Medicare Other | Admitting: Family Medicine

## 2016-09-29 VITALS — BP 136/82 | HR 68 | Temp 97.9°F | Ht 67.0 in | Wt 134.8 lb

## 2016-09-29 DIAGNOSIS — I10 Essential (primary) hypertension: Secondary | ICD-10-CM

## 2016-09-29 DIAGNOSIS — I739 Peripheral vascular disease, unspecified: Secondary | ICD-10-CM

## 2016-09-29 DIAGNOSIS — Z5181 Encounter for therapeutic drug level monitoring: Secondary | ICD-10-CM | POA: Diagnosis not present

## 2016-09-29 DIAGNOSIS — E871 Hypo-osmolality and hyponatremia: Secondary | ICD-10-CM | POA: Diagnosis not present

## 2016-09-29 DIAGNOSIS — Z89512 Acquired absence of left leg below knee: Secondary | ICD-10-CM | POA: Diagnosis not present

## 2016-09-29 LAB — BASIC METABOLIC PANEL
BUN: 11 mg/dL (ref 6–23)
CALCIUM: 9.3 mg/dL (ref 8.4–10.5)
CO2: 25 meq/L (ref 19–32)
Chloride: 100 mEq/L (ref 96–112)
Creatinine, Ser: 0.46 mg/dL (ref 0.40–1.20)
GFR: 137.95 mL/min (ref >60.00)
GLUCOSE: 90 mg/dL (ref 70–99)
POTASSIUM: 4.2 meq/L (ref 3.5–5.1)
Sodium: 131 mEq/L — ABNORMAL LOW (ref 135–145)

## 2016-09-29 NOTE — Patient Instructions (Signed)
It was a pleasure to see you as always!   If you do decide to see podiatry about your right toenails I am glad to put in a referral for you We will check on your electrolytes and kidney function today  You might want to consider getting the "shingrix" vaccine at your convenience.  This is available at most major drug stores Medicare does not cover routine tetanus immunization. However if you have any significant wound please alert me and we will boost this shot for you  Please see me in about 6 months for your check up and fasting complete labs

## 2016-09-29 NOTE — Progress Notes (Addendum)
[place] at [facility] [address], [place],  [id number] [phone]  Date:  09/29/2016   Name:  [name]   DOB:  [birth year]   MRN:  [id number]  PCP:  [name], MD    Chief Complaint: Follow-up (Pt here for 6 month f/u visit. )   History of Present Illness:  [name] is a 83 y.o. very pleasant female patient who presents with the following:  Here today for a 6 month follow-up visit She is feeling well today, states that she has not concerns She had a left BKA a few years ago due to pain from peripheral vascular disease.  She has some sx in the right leg as well but they are hoping to avoid amputation. For now the plan is for her to "live with it as long as I can Needs a BMP today- we did need to repeat her sodium as it was a bit low  Her last tetanus was over 10 years ago- she did have a wound about 20 years ago and got a booster  No more recent wound except for surgical She has not had shingles immunization as of yet  She is getting around well, has a good appetite and is in good spirits   She is here with her grandson who is her main supporter.  Her son very sadly died about 73 months ago from an MI   BP Readings from Last 3 Encounters:  09/29/16 (!) 142/82  03/31/16 115/80  07/25/15 138/72     Patient Active Problem List   Diagnosis Date Noted  . Loss or death of child 2015-06-28  . Status post below knee amputation of left lower extremity ([place]) 01/31/2015  . Non-healing wound of lower extremity 01/29/2015  . Ulcer of toe of left foot ([place]) 12/27/2014  . Essential hypertension 08/22/2014    Past Medical History:  Diagnosis Date  . Arthritis   . Cancer ([place])    utertrine  . Hypertension   . PAD (peripheral artery disease) ([place])     Past Surgical History:  Procedure Laterality Date  . ABDOMINAL HYSTERECTOMY     partial  . AMPUTATION Left 01/29/2015   Procedure: Left   AMPUTATION BELOW KNEE;  Surgeon: Elam Dutch, MD;  Location: [facility];  Service: Vascular;  Laterality: Left;  . KNEE SURGERY Right 1984  . PERIPHERAL VASCULAR CATHETERIZATION N/A 09/15/2014   Procedure: Abdominal Aortogram;  Surgeon: Elam Dutch, MD;  [facility] INVAS[facility] CV LAB CUPID;  Service: Cardiovascular;  Laterality: N/A;  . PERIPHERAL VASCULAR CATHETERIZATION Bilateral 09/15/2014   Procedure: Lower Extremity Angiography;  Surgeon: Elam Dutch, MD;  Location: [facility] INVASIVE CV LAB CUPID;  Service: Cardiovascular;  Laterality: Bilateral;  . SPINE SURGERY      Social History  Substance Use Topics  . Smoking status: Never Smoker  . Smokeless tobacco: Never Used  . Alcohol use No    Family History  Problem Relation Age of Onset  . Diabetes Mother   . Heart disease Mother   . Dementia Sister   . Diabetes Son     No Known Allergies  Medication list has been reviewed and updated.  Current Outpatient Prescriptions on File Prior to Visit  Medication Sig Dispense Refill  . clopidogrel (PLAVIX) 75 MG tablet Take 1 tablet (75 mg total) by mouth daily. 90 tablet 3  . lisinopril-hydrochlorothiazide (PRINZIDE,ZESTORETIC) 10-12.5 MG tablet Take 1  tablet by mouth daily. 90 tablet 1  . omeprazole (PRILOSEC) 20 MG capsule Take 20 mg by mouth daily.    Vladimir Faster Glycol-Propyl Glycol (SYSTANE FREE OP) Place 1 drop into both eyes daily. For dry eyes     No current facility-administered medications on file prior to visit.     Review of Systems:  As per HPI- otherwise negative. No fever or chills, no CP or SOB, appetite is good, no rash    Physical Examination: Vitals:   09/29/16 1000  BP: (!) 142/82  Pulse: 68  Temp: 97.9 F (36.6 C)   Vitals:   09/29/16 1000  Weight: 134 lb 12.8 oz (61.1 kg)  Height: 5\' 7"  (1.702 m)   Body mass index is 21.11 kg/m. Ideal Body Weight: Weight in (lb) to have BMI = 25: 159.3  GEN: WDWN, NAD, Non-toxic, A & O x 3, petite build,  looks well HEENT: Atraumatic, Normocephalic. Neck supple. No masses, No LAD.  Bilateral TM wnl, oropharynx normal.  PEERL,EOMI.   Ears and Nose: No external deformity. CV: RRR, No M/G/R. No JVD. No thrill. No extra heart sounds. PULM: CTA B, no wheezes, crackles, rhonchi. No retractions. No resp. distress. No accessory muscle use. EXTR: No c/c/e NEURO Normal gait.  PSYCH: Normally interactive. Conversant. Not depressed or anxious appearing.  Calm demeanor.  Left BKA, wearing prosthesis Right foot: toes are dark red/ purple in color but she has no skin breakdown. No pain to touch. The toenails are quite hypertrophic  Assessment and Plan: Essential hypertension  Medication monitoring encounter - Plan: Basic metabolic panel  Peripheral arterial disease ([place])  Status post below knee amputation of left lower extremity ([facility])  Here today for a followup visit Will check her BMP today to monitor lytes and renal function- otherwise labs are UTD She is on prinzide for her HTN Discussed tetanus and shingrix with her today She is continuing to manage her PAD and at his time is stable and doing ok Will plan further follow- up pending labs.- assuming all is well plan for 6 months  She is taking plavix for this   Signed Lamar Blinks, MD  Received her labs and gave her a call on 5/24- her sodium is a bit low. She is also noticing frequent urination which is bothersome. We will dc prinzide and change her to lisinpril 20. She will come in for a BMP in one month She is in agreement with plan  Results for orders placed or performed in visit on 75/10/25  Basic metabolic panel  Result Value Ref Range   Sodium 131 (L) 135 - 145 mEq/L   Potassium 4.2 3.5 - 5.1 mEq/L   Chloride 100 96 - 112 mEq/L   CO2 25 19 - 32 mEq/L   Glucose, Bld 90 70 - 99 mg/dL   BUN 11 6 - 23 mg/dL   Creatinine, Ser 0.46 0.40 - 1.20 mg/dL   Calcium 9.3 8.4 - 10.5 mg/dL   GFR 137.95 >60.00 mL/min   Her sodium is a bit  low

## 2016-10-02 ENCOUNTER — Encounter: Payer: Self-pay | Admitting: Family Medicine

## 2016-10-02 MED ORDER — LISINOPRIL 20 MG PO TABS: 20.0000 mg | ORAL_TABLET | Freq: Every day | ORAL | 3 refills | Status: DC

## 2016-10-02 NOTE — Addendum Note (Signed)
Addended by: Lamar Blinks C on: 10/02/2016 06:31 PM   Modules accepted: Orders

## 2016-10-03 ENCOUNTER — Other Ambulatory Visit (INDEPENDENT_AMBULATORY_CARE_PROVIDER_SITE_OTHER): Payer: Medicare Other

## 2016-10-03 DIAGNOSIS — E871 Hypo-osmolality and hyponatremia: Secondary | ICD-10-CM

## 2016-10-03 LAB — BASIC METABOLIC PANEL
BUN: 13 mg/dL (ref 6–23)
CO2: 24 mEq/L (ref 19–32)
Calcium: 9.3 mg/dL (ref 8.4–10.5)
Chloride: 101 mEq/L (ref 96–112)
Creatinine, Ser: 0.62 mg/dL (ref 0.40–1.20)
GFR: 97.75 mL/min (ref >60.00)
GLUCOSE: 95 mg/dL (ref 70–99)
POTASSIUM: 4.4 meq/L (ref 3.5–5.1)
SODIUM: 132 meq/L — AB (ref 135–145)

## 2016-10-28 ENCOUNTER — Telehealth: Payer: Self-pay | Admitting: Family Medicine

## 2016-10-28 DIAGNOSIS — E871 Hypo-osmolality and hyponatremia: Secondary | ICD-10-CM

## 2016-10-28 NOTE — Telephone Encounter (Signed)
Caller name:Byron Relationship to patient:Grandson Can be reached:[phone] Pharmacy:  Reason for call:would like to know when patient is due for follow up labs

## 2016-10-28 NOTE — Telephone Encounter (Signed)
She can come in for a lab visit only in 1-2 months.  Otherwise I believe she is seeing me in November which is fine as long as she is feeling well  Please let [name] know

## 2016-10-28 NOTE — Telephone Encounter (Signed)
Spoke with [name] regarding his question about labs for patient. Advised Dr. Lorelei Pont states 1-2 months or November when she is scheduled if she feels well.

## 2017-02-02 DIAGNOSIS — Z23 Encounter for immunization: Secondary | ICD-10-CM | POA: Diagnosis not present

## 2017-03-27 NOTE — Progress Notes (Signed)
Subjective:   [name] is a 83 y.o. female who presents for an Initial Medicare Annual Wellness Visit.  Review of Systems    No ROS.  Medicare Wellness Visit. Additional risk factors are reflected in the social history. Cardiac Risk Factors include: advanced age (>80men, >6 women);hypertension Sleep patterns: sleeps 8-10 hrs per night.  Lives alone. Son lives next door and checks on her daily. Son is here with her today and is very supportive of all of her needs.    Female:         Mammo- No longer doing routine screening due to age.       Dexa scan-  No longer doing routine screening due to age.         Objective:    Today's Vitals   04/01/17 0939  BP: 138/72  Pulse: 72  SpO2: 98%  Weight: 130 lb 3.2 oz (59.1 kg)  Height: 5\' 7"  (1.702 m)   Body mass index is 20.39 kg/m.   Current Medications (verified) Outpatient Encounter Medications as of 04/01/2017  Medication Sig  . clopidogrel (PLAVIX) 75 MG tablet Take 1 tablet (75 mg total) by mouth daily.  Marland Kitchen lisinopril (PRINIVIL,ZESTRIL) 20 MG tablet Take 1 tablet (20 mg total) by mouth daily.  Marland Kitchen omeprazole (PRILOSEC) 20 MG capsule Take 20 mg by mouth daily.  Vladimir Faster Glycol-Propyl Glycol (SYSTANE FREE OP) Place 1 drop into both eyes daily. For dry eyes   No facility-administered encounter medications on file as of 04/01/2017.     Allergies (verified) Patient has no known allergies.   History: Past Medical History:  Diagnosis Date  . Arthritis   . Cancer ([place])    utertrine  . Hypertension   . PAD (peripheral artery disease) ([place])    Past Surgical History:  Procedure Laterality Date  . ABDOMINAL HYSTERECTOMY     partial  . AMPUTATION Left 01/29/2015   Procedure: Left  AMPUTATION BELOW KNEE;  Surgeon: Elam Dutch, MD;  Location: [facility];  Service: Vascular;  Laterality: Left;  . KNEE SURGERY Right 1984  . PERIPHERAL VASCULAR CATHETERIZATION N/A 09/15/2014   Procedure: Abdominal Aortogram;   Surgeon: Elam Dutch, MD;  Location: [facility] INVASIVE CV LAB CUPID;  Service: Cardiovascular;  Laterality: N/A;  . PERIPHERAL VASCULAR CATHETERIZATION Bilateral 09/15/2014   Procedure: Lower Extremity Angiography;  Surgeon: Elam Dutch, MD;  Location: [place] INVASIVE CV LAB CUPID;  Service: Cardiovascular;  Laterality: Bilateral;  . SPINE SURGERY     Family History  Problem Relation Age of Onset  . Diabetes Mother   . Heart disease Mother   . Dementia Sister   . Diabetes Son    Social History   Occupational History  . Not on file  Tobacco Use  . Smoking status: Never Smoker  . Smokeless tobacco: Never Used  Substance and Sexual Activity  . Alcohol use: No    Alcohol/week: 0.0 oz  . Drug use: No  . Sexual activity: Not on file    Tobacco Counseling Counseling given: Not Answered   Activities of Daily Living In your present state of health, do you have any difficulty performing the following activities: 04/01/2017  Hearing? N  Vision? N  Comment wearing glasses. Eye doctor yearly. New glasses 6 months ago  Difficulty concentrating or making decisions? N  Walking or climbing stairs? N  Dressing or bathing? N  Doing errands, shopping? N  Preparing Food and eating ? N  Using the Toilet? N  In the past six months, have you accidently leaked urine? N  Do you have problems with loss of bowel control? N  Managing your Medications? N  Managing your Finances? N  Housekeeping or managing your Housekeeping? N  Some recent data might be hidden    Immunizations and Health Maintenance Immunization History  Administered Date(s) Administered  . Influenza, High Dose Seasonal PF 02/02/2017  . Influenza,inj,Quad PF,6+ Mos 01/31/2015  . Influenza-Unspecified 03/05/2016  . Pneumococcal Conjugate-13 03/31/2016  . Pneumococcal Polysaccharide-23 05/12/2012  . Zoster Recombinat (Shingrix) 09/30/2016   Health Maintenance Due  Topic Date Due  . TETANUS/TDAP  12/07/1952  . DEXA SCAN   12/08/1998    Patient Care Team: Copland, Gay Filler, MD as PCP - General (Family Medicine)  Indicate any recent Medical Services you may have received from other than [facility] providers in the past year (date may be approximate).     Assessment:   This is a routine wellness examination for [facility]. Physical assessment deferred to PCP.   Hearing/Vision screen Hearing Screening Comments: Able to hear conversational tones w/o difficulty. No issues reported.   Vision Screening Comments: Pt states yearly eye exams and last was 6 months ago .  Dietary issues and exercise activities discussed: Current Exercise Habits: Home exercise routine, Type of exercise: stretching, Time (Minutes): 60, Frequency (Times/Week): 7, Weekly Exercise (Minutes/Week): 420, Intensity: Mild Diet (meal preparation, eat out, water intake, caffeinated beverages, dairy products, fruits and vegetables): in general, a "healthy" diet       Goals    . Maintain current health and independence      Depression Screen PHQ 2/9 Scores 04/01/2017 09/29/2016 06/02/2015 03/13/2015 02/14/2015 11/16/2014 10/16/2014  PHQ - 2 Score 0 0 0 0 0 0 0    Fall Risk Fall Risk  04/01/2017 09/29/2016 04/03/2015 03/13/2015 02/14/2015  Falls in the past year? No No No No No    Cognitive Function: MMSE - Mini Mental State Exam 04/01/2017  Orientation to time 5  Orientation to Place 5  Registration 3  Attention/ Calculation 4  Recall 3  Language- name 2 objects 2  Language- repeat 1  Language- follow 3 step command 3  Language- read & follow direction 1  Write a sentence 1  Copy design 1  Total score 29        Screening Tests Health Maintenance  Topic Date Due  . TETANUS/TDAP  12/07/1952  . DEXA SCAN  12/08/1998  . INFLUENZA VACCINE  Completed  . PNA vac Low Risk Adult  Completed      Plan:   Follow up with PCP today as scheduled  Continue to eat heart healthy diet (full of fruits, vegetables, whole grains, lean protein,  water--limit salt, fat, and sugar intake) and increase physical activity as tolerated.  Continue doing brain stimulating activities (puzzles, reading, adult coloring books, staying active) to keep memory sharp.   Bring a copy of your living will and/or healthcare power of attorney to your next office visit.  I have personally reviewed and noted the following in the patient's chart:   . Medical and social history . Use of alcohol, tobacco or illicit drugs  . Current medications and supplements . Functional ability and status . Nutritional status . Physical activity . Advanced directives . List of other physicians . Hospitalizations, surgeries, and ER visits in previous 12 months . Vitals . Screenings to include cognitive, depression, and falls . Referrals and appointments  In addition, I have reviewed and discussed with patient  certain preventive protocols, quality metrics, and best practice recommendations. A written personalized care plan for preventive services as well as general preventive health recommendations were provided to patient.     Shela Nevin, [place]   04/01/2017

## 2017-03-31 NOTE — Progress Notes (Addendum)
[place] at [facility] [address] [place], [place] [address] [phone]  Date:  04/01/2017   Name:  [name]   DOB:  1931-06-13   MRN:  [id number]  PCP:  [name], MD    Chief Complaint: Medicare Wellness (with RN)   History of Present Illness:  [name] is a 83 y.o. very pleasant female patient who presents with the following:  6 month follow-up visit today History of left BKA, HTN From our visit in May of this year:  She had a left BKA a few years ago due to pain from peripheral vascular disease.  She has some sx in the right leg as well but they are hoping to avoid amputation. For now the plan is for her to "live with it as long as I can Needs a BMP today- we did need to repeat her sodium as it was a bit low Her last tetanus was over 10 years ago- she did have a wound about 20 years ago and got a booster No more recent wound except for surgical She has not had shingles immunization as of yet She is getting around well, has a good appetite and is in good spirits  She is here with her grandson who is her main supporter.  Her son very sadly died about 32 months ago from an MI  Bone density: due.  After discussion she would like to do this test  Flu: UTD She had both her pneumonia shots Tetanus: she is at low risk, declines to have this today She did get her first shingrix through [facility] back in May. She is waiting for them to have supply to do her 2nd dose Due for full labs today if she is fasting- she did eat some cereal this am, many hours ago  Here today with her grand-son and great grand-daughter She does have OA pain in her kness Her right leg is not bothering her too much right now  Patient Active Problem List   Diagnosis Date Noted  . Loss or death of child 2015-06-19  . Status post below knee amputation of left lower extremity ([place]) 01/31/2015  . Non-healing wound of lower extremity  01/29/2015  . Ulcer of toe of left foot ([place]) 12/27/2014  . Essential hypertension 08/22/2014    Past Medical History:  Diagnosis Date  . Arthritis   . Cancer ([place])    utertrine  . Hypertension   . PAD (peripheral artery disease) ([place])     Past Surgical History:  Procedure Laterality Date  . ABDOMINAL HYSTERECTOMY     partial  . AMPUTATION Left 01/29/2015   Procedure: Left  AMPUTATION BELOW KNEE;  Surgeon: Elam Dutch, MD;  Location: [place];  Service: Vascular;  Laterality: Left;  . KNEE SURGERY Right 1984  . PERIPHERAL VASCULAR CATHETERIZATION N/A 09/15/2014   Procedure: Abdominal Aortogram;  Surgeon: Elam Dutch, MD;  Location: [facility] INVASIVE CV LAB CUPID;  Service: Cardiovascular;  Laterality: N/A;  . PERIPHERAL VASCULAR CATHETERIZATION Bilateral 09/15/2014   Procedure: Lower Extremity Angiography;  Surgeon: Elam Dutch, MD;  Location: [facility] INVASIVE CV LAB CUPID;  Service: Cardiovascular;  Laterality: Bilateral;  . SPINE SURGERY      Social History   Tobacco Use  . Smoking status: Never Smoker  . Smokeless tobacco: Never Used  Substance Use Topics  . Alcohol use: No    Alcohol/week: 0.0 oz  . Drug  use: No    Family History  Problem Relation Age of Onset  . Diabetes Mother   . Heart disease Mother   . Dementia Sister   . Diabetes Son     No Known Allergies  Medication list has been reviewed and updated.  Current Outpatient Medications on File Prior to Visit  Medication Sig Dispense Refill  . lisinopril (PRINIVIL,ZESTRIL) 20 MG tablet Take 1 tablet (20 mg total) by mouth daily. 90 tablet 3  . omeprazole (PRILOSEC) 20 MG capsule Take 20 mg by mouth daily.    Vladimir Faster Glycol-Propyl Glycol (SYSTANE FREE OP) Place 1 drop into both eyes daily. For dry eyes     No current facility-administered medications on file prior to visit.     Review of Systems:  As per HPI- otherwise negative.   Physical Examination: Vitals:   04/01/17 0939  BP: 138/72   Pulse: 72  SpO2: 98%   Vitals:   04/01/17 0939  Weight: 130 lb 3.2 oz (59.1 kg)  Height: 5\' 7"  (1.702 m)   Body mass index is 20.39 kg/m. Ideal Body Weight: Weight in (lb) to have BMI = 25: 159.3  GEN: WDWN, NAD, Non-toxic, A & O x 3, well appearing, elderly lady  HEENT: Atraumatic, Normocephalic. Neck supple. No masses, No LAD. Ears and Nose: No external deformity. CV: RRR, No M/G/R. No JVD. No thrill. No extra heart sounds. PULM: CTA B, no wheezes, crackles, rhonchi. No retractions. No resp. distress. No accessory muscle use. EXTR: No c/c/e NEURO Normal gait for pt- uses a walker.  S/p LEFT BKA PSYCH: Normally interactive. Conversant. Not depressed or anxious appearing.  Calm demeanor.    Assessment and Plan: Essential hypertension - Plan: Lipid panel  Hyponatremia  Medication monitoring encounter - Plan: CBC, Comprehensive metabolic panel  Peripheral arterial disease (HCC) - Plan: Lipid panel  Status post below knee amputation of left lower extremity ([place]) - Plan: Lipid panel  Screening for hyperlipidemia - Plan: Lipid panel  Encounter for Medicare annual wellness exam  PVD (peripheral vascular disease) ([place]) - Plan: clopidogrel (PLAVIX) 75 MG tablet  Estrogen deficiency - Plan: DG Bone Density  Follow-up visit today Monitor for hyponatremia Lipids pending Refilled her plavix for PVD Ordered bone density exam today  Signed Lamar Blinks, MD  I have reviewed MWE note by Ms. Vevelyn Royals and agree with her documentation  Received her labs 11/25- gave her a call. LMOM-  All is ok except for hyperkalemia, mild.  Please come in for a BMP this week to recheck  Results for orders placed or performed in visit on 04/01/17  CBC  Result Value Ref Range   WBC 4.4 4.0 - 10.5 K/uL   RBC 4.29 3.87 - 5.11 Mil/uL   Platelets 273.0 150.0 - 400.0 K/uL   Hemoglobin 13.2 12.0 - 15.0 g/dL   HCT 40.0 36.0 - 46.0 %   MCV 93.3 78.0 - 100.0 fl   MCHC 32.9 30.0 - 36.0 g/dL   RDW  13.8 11.5 - 15.5 %  Comprehensive metabolic panel  Result Value Ref Range   Sodium 140 135 - 145 mEq/L   Potassium 5.4 (H) 3.5 - 5.1 mEq/L   Chloride 105 96 - 112 mEq/L   CO2 30 19 - 32 mEq/L   Glucose, Bld 101 (H) 70 - 99 mg/dL   BUN 10 6 - 23 mg/dL   Creatinine, Ser 0.45 0.40 - 1.20 mg/dL   Total Bilirubin 0.5 0.2 - 1.2 mg/dL   Alkaline  Phosphatase 66 39 - 117 U/L   AST 14 0 - 37 U/L   ALT 9 0 - 35 U/L   Total Protein 6.7 6.0 - 8.3 g/dL   Albumin 4.1 3.5 - 5.2 g/dL   Calcium 9.8 8.4 - 10.5 mg/dL   GFR 141.32 >60.00 mL/min  Lipid panel  Result Value Ref Range   Cholesterol 121 0 - 200 mg/dL   Triglycerides 52.0 0.0 - 149.0 mg/dL   HDL 44.40 >39.00 mg/dL   VLDL 10.4 0.0 - 40.0 mg/dL   LDL Cholesterol 66 0 - 99 mg/dL   Total CHOL/HDL Ratio 3    NonHDL 76.20

## 2017-04-01 ENCOUNTER — Ambulatory Visit (INDEPENDENT_AMBULATORY_CARE_PROVIDER_SITE_OTHER): Payer: Medicare Other | Admitting: Family Medicine

## 2017-04-01 ENCOUNTER — Encounter: Payer: Self-pay | Admitting: Family Medicine

## 2017-04-01 VITALS — BP 138/72 | HR 72 | Ht 67.0 in | Wt 130.2 lb

## 2017-04-01 DIAGNOSIS — Z5181 Encounter for therapeutic drug level monitoring: Secondary | ICD-10-CM

## 2017-04-01 DIAGNOSIS — E871 Hypo-osmolality and hyponatremia: Secondary | ICD-10-CM

## 2017-04-01 DIAGNOSIS — Z13220 Encounter for screening for lipoid disorders: Secondary | ICD-10-CM

## 2017-04-01 DIAGNOSIS — Z0000 Encounter for general adult medical examination without abnormal findings: Secondary | ICD-10-CM | POA: Diagnosis not present

## 2017-04-01 DIAGNOSIS — E875 Hyperkalemia: Secondary | ICD-10-CM | POA: Diagnosis not present

## 2017-04-01 DIAGNOSIS — Z89512 Acquired absence of left leg below knee: Secondary | ICD-10-CM

## 2017-04-01 DIAGNOSIS — I10 Essential (primary) hypertension: Secondary | ICD-10-CM | POA: Diagnosis not present

## 2017-04-01 DIAGNOSIS — E2839 Other primary ovarian failure: Secondary | ICD-10-CM

## 2017-04-01 DIAGNOSIS — I739 Peripheral vascular disease, unspecified: Secondary | ICD-10-CM

## 2017-04-01 LAB — CBC
HCT: 40 % (ref 36.0–46.0)
Hemoglobin: 13.2 g/dL (ref 12.0–15.0)
MCHC: 32.9 g/dL (ref 30.0–36.0)
MCV: 93.3 fl (ref 78.0–100.0)
PLATELETS: 273 10*3/uL (ref 150.0–400.0)
RBC: 4.29 Mil/uL (ref 3.87–5.11)
RDW: 13.8 % (ref 11.5–15.5)
WBC: 4.4 10*3/uL (ref 4.0–10.5)

## 2017-04-01 LAB — LIPID PANEL
CHOL/HDL RATIO: 3
CHOLESTEROL: 121 mg/dL (ref 0–200)
HDL: 44.4 mg/dL (ref >39.00)
LDL CALC: 66 mg/dL (ref 0–99)
NonHDL: 76.2
TRIGLYCERIDES: 52 mg/dL (ref 0.0–149.0)
VLDL: 10.4 mg/dL (ref 0.0–40.0)

## 2017-04-01 LAB — COMPREHENSIVE METABOLIC PANEL
ALT: 9 U/L (ref 0–35)
AST: 14 U/L (ref 0–37)
Albumin: 4.1 g/dL (ref 3.5–5.2)
Alkaline Phosphatase: 66 U/L (ref 39–117)
BUN: 10 mg/dL (ref 6–23)
CALCIUM: 9.8 mg/dL (ref 8.4–10.5)
CHLORIDE: 105 meq/L (ref 96–112)
CO2: 30 meq/L (ref 19–32)
CREATININE: 0.45 mg/dL (ref 0.40–1.20)
GFR: 141.32 mL/min (ref >60.00)
Glucose, Bld: 101 mg/dL — ABNORMAL HIGH (ref 70–99)
POTASSIUM: 5.4 meq/L — AB (ref 3.5–5.1)
Sodium: 140 mEq/L (ref 135–145)
Total Bilirubin: 0.5 mg/dL (ref 0.2–1.2)
Total Protein: 6.7 g/dL (ref 6.0–8.3)

## 2017-04-01 MED ORDER — CLOPIDOGREL BISULFATE 75 MG PO TABS: 75.0000 mg | ORAL_TABLET | Freq: Every day | ORAL | 3 refills | Status: DC

## 2017-04-01 NOTE — Patient Instructions (Addendum)
Great to see you today- your BP looks good today I will be in touch with your labs We will also set you up for a bone density screening- if your bones are weak we can use a medication to help strengthen them   Let's plan to recheck here in 4-6 months  Happy holidays!  Ms. [name] , Thank you for taking time to come for your Medicare Wellness Visit. I appreciate your ongoing commitment to your health goals. Please review the following plan we discussed and let me know if I can assist you in the future.   These are the goals we discussed: Goals    . Maintain current health and independence       This is a list of the screening recommended for you and due dates:  Health Maintenance  Topic Date Due  . Tetanus Vaccine  12/07/1952  . DEXA scan (bone density measurement)  12/08/1998  . Flu Shot  Completed  . Pneumonia vaccines  Completed    Continue to eat heart healthy diet (full of fruits, vegetables, whole grains, lean protein, water--limit salt, fat, and sugar intake) and increase physical activity as tolerated.  Continue doing brain stimulating activities (puzzles, reading, adult coloring books, staying active) to keep memory sharp.   Bring a copy of your living will and/or healthcare power of attorney to your next office visit.   Health Maintenance for Postmenopausal Women Menopause is a normal process in which your reproductive ability comes to an end. This process happens gradually over a span of months to years, usually between the ages of 46 and 66. Menopause is complete when you have missed 12 consecutive menstrual periods. It is important to talk with your health care provider about some of the most common conditions that affect postmenopausal women, such as heart disease, cancer, and bone loss (osteoporosis). Adopting a healthy lifestyle and getting preventive care can help to promote your health and wellness. Those actions can also lower your chances of developing some of these  common conditions. What should I know about menopause? During menopause, you may experience a number of symptoms, such as:  Moderate-to-severe hot flashes.  Night sweats.  Decrease in sex drive.  Mood swings.  Headaches.  Tiredness.  Irritability.  Memory problems.  Insomnia.  Choosing to treat or not to treat menopausal changes is an individual decision that you make with your health care provider. What should I know about hormone replacement therapy and supplements? Hormone therapy products are effective for treating symptoms that are associated with menopause, such as hot flashes and night sweats. Hormone replacement carries certain risks, especially as you become older. If you are thinking about using estrogen or estrogen with progestin treatments, discuss the benefits and risks with your health care provider. What should I know about heart disease and stroke? Heart disease, heart attack, and stroke become more likely as you age. This may be due, in part, to the hormonal changes that your body experiences during menopause. These can affect how your body processes dietary fats, triglycerides, and cholesterol. Heart attack and stroke are both medical emergencies. There are many things that you can do to help prevent heart disease and stroke:  Have your blood pressure checked at least every 1-2 years. High blood pressure causes heart disease and increases the risk of stroke.  If you are 73-109 years old, ask your health care provider if you should take aspirin to prevent a heart attack or a stroke.  Do not use any  tobacco products, including cigarettes, chewing tobacco, or electronic cigarettes. If you need help quitting, ask your health care provider.  It is important to eat a healthy diet and maintain a healthy weight. ? Be sure to include plenty of vegetables, fruits, low-fat dairy products, and lean protein. ? Avoid eating foods that are high in solid fats, added sugars, or  salt (sodium).  Get regular exercise. This is one of the most important things that you can do for your health. ? Try to exercise for at least 150 minutes each week. The type of exercise that you do should increase your heart rate and make you sweat. This is known as moderate-intensity exercise. ? Try to do strengthening exercises at least twice each week. Do these in addition to the moderate-intensity exercise.  Know your numbers.Ask your health care provider to check your cholesterol and your blood glucose. Continue to have your blood tested as directed by your health care provider.  What should I know about cancer screening? There are several types of cancer. Take the following steps to reduce your risk and to catch any cancer development as early as possible. Breast Cancer  Practice breast self-awareness. ? This means understanding how your breasts normally appear and feel. ? It also means doing regular breast self-exams. Let your health care provider know about any changes, no matter how small.  If you are 59 or older, have a clinician do a breast exam (clinical breast exam or CBE) every year. Depending on your age, family history, and medical history, it may be recommended that you also have a yearly breast X-ray (mammogram).  If you have a family history of breast cancer, talk with your health care provider about genetic screening.  If you are at high risk for breast cancer, talk with your health care provider about having an MRI and a mammogram every year.  Breast cancer (BRCA) gene test is recommended for women who have family members with BRCA-related cancers. Results of the assessment will determine the need for genetic counseling and BRCA1 and for BRCA2 testing. BRCA-related cancers include these types: ? Breast. This occurs in males or females. ? Ovarian. ? Tubal. This may also be called fallopian tube cancer. ? Cancer of the abdominal or pelvic lining (peritoneal  cancer). ? Prostate. ? Pancreatic.  Cervical, Uterine, and Ovarian Cancer Your health care provider may recommend that you be screened regularly for cancer of the pelvic organs. These include your ovaries, uterus, and vagina. This screening involves a pelvic exam, which includes checking for microscopic changes to the surface of your cervix (Pap test).  For women ages 21-65, health care providers may recommend a pelvic exam and a Pap test every three years. For women ages 16-65, they may recommend the Pap test and pelvic exam, combined with testing for human papilloma virus (HPV), every five years. Some types of HPV increase your risk of cervical cancer. Testing for HPV may also be done on women of any age who have unclear Pap test results.  Other health care providers may not recommend any screening for nonpregnant women who are considered low risk for pelvic cancer and have no symptoms. Ask your health care provider if a screening pelvic exam is right for you.  If you have had past treatment for cervical cancer or a condition that could lead to cancer, you need Pap tests and screening for cancer for at least 20 years after your treatment. If Pap tests have been discontinued for you, your risk  factors (such as having a new sexual partner) need to be reassessed to determine if you should start having screenings again. Some women have medical problems that increase the chance of getting cervical cancer. In these cases, your health care provider may recommend that you have screening and Pap tests more often.  If you have a family history of uterine cancer or ovarian cancer, talk with your health care provider about genetic screening.  If you have vaginal bleeding after reaching menopause, tell your health care provider.  There are currently no reliable tests available to screen for ovarian cancer.  Lung Cancer Lung cancer screening is recommended for adults 80-6 years old who are at high risk for  lung cancer because of a history of smoking. A yearly low-dose CT scan of the lungs is recommended if you:  Currently smoke.  Have a history of at least 30 pack-years of smoking and you currently smoke or have quit within the past 15 years. A pack-year is smoking an average of one pack of cigarettes per day for one year.  Yearly screening should:  Continue until it has been 15 years since you quit.  Stop if you develop a health problem that would prevent you from having lung cancer treatment.  Colorectal Cancer  This type of cancer can be detected and can often be prevented.  Routine colorectal cancer screening usually begins at age 83 and continues through age 96.  If you have risk factors for colon cancer, your health care provider may recommend that you be screened at an earlier age.  If you have a family history of colorectal cancer, talk with your health care provider about genetic screening.  Your health care provider may also recommend using home test kits to check for hidden blood in your stool.  A small camera at the end of a tube can be used to examine your colon directly (sigmoidoscopy or colonoscopy). This is done to check for the earliest forms of colorectal cancer.  Direct examination of the colon should be repeated every 5-10 years until age 24. However, if early forms of precancerous polyps or small growths are found or if you have a family history or genetic risk for colorectal cancer, you may need to be screened more often.  Skin Cancer  Check your skin from head to toe regularly.  Monitor any moles. Be sure to tell your health care provider: ? About any new moles or changes in moles, especially if there is a change in a mole's shape or color. ? If you have a mole that is larger than the size of a pencil eraser.  If any of your family members has a history of skin cancer, especially at a young age, talk with your health care provider about genetic  screening.  Always use sunscreen. Apply sunscreen liberally and repeatedly throughout the day.  Whenever you are outside, protect yourself by wearing long sleeves, pants, a wide-brimmed hat, and sunglasses.  What should I know about osteoporosis? Osteoporosis is a condition in which bone destruction happens more quickly than new bone creation. After menopause, you may be at an increased risk for osteoporosis. To help prevent osteoporosis or the bone fractures that can happen because of osteoporosis, the following is recommended:  If you are 31-27 years old, get at least 1,000 mg of calcium and at least 600 mg of vitamin D per day.  If you are older than age 56 but younger than age 11, get at least 1,200 mg  of calcium and at least 600 mg of vitamin D per day.  If you are older than age 52, get at least 1,200 mg of calcium and at least 800 mg of vitamin D per day.  Smoking and excessive alcohol intake increase the risk of osteoporosis. Eat foods that are rich in calcium and vitamin D, and do weight-bearing exercises several times each week as directed by your health care provider. What should I know about how menopause affects my mental health? Depression may occur at any age, but it is more common as you become older. Common symptoms of depression include:  Low or sad mood.  Changes in sleep patterns.  Changes in appetite or eating patterns.  Feeling an overall lack of motivation or enjoyment of activities that you previously enjoyed.  Frequent crying spells.  Talk with your health care provider if you think that you are experiencing depression. What should I know about immunizations? It is important that you get and maintain your immunizations. These include:  Tetanus, diphtheria, and pertussis (Tdap) booster vaccine.  Influenza every year before the flu season begins.  Pneumonia vaccine.  Shingles vaccine.  Your health care provider may also recommend other  immunizations. This information is not intended to replace advice given to you by your health care provider. Make sure you discuss any questions you have with your health care provider. Document Released: 06/20/2005 Document Revised: 11/16/2015 Document Reviewed: 01/30/2015 Elsevier Interactive Patient Education  2018 [employer].

## 2017-04-02 ENCOUNTER — Encounter: Payer: Self-pay | Admitting: Family Medicine

## 2017-04-05 NOTE — Addendum Note (Signed)
Addended by: Lamar Blinks C on: 04/05/2017 01:57 PM   Modules accepted: Orders

## 2017-04-06 ENCOUNTER — Other Ambulatory Visit (INDEPENDENT_AMBULATORY_CARE_PROVIDER_SITE_OTHER): Payer: Medicare Other

## 2017-04-06 DIAGNOSIS — E875 Hyperkalemia: Secondary | ICD-10-CM

## 2017-04-06 LAB — BASIC METABOLIC PANEL
BUN: 10 mg/dL (ref 6–23)
CHLORIDE: 106 meq/L (ref 96–112)
CO2: 27 meq/L (ref 19–32)
Calcium: 9.9 mg/dL (ref 8.4–10.5)
Creatinine, Ser: 0.51 mg/dL (ref 0.40–1.20)
GFR: 122.31 mL/min (ref >60.00)
GLUCOSE: 98 mg/dL (ref 70–99)
POTASSIUM: 5.4 meq/L — AB (ref 3.5–5.1)
SODIUM: 140 meq/L (ref 135–145)

## 2017-04-09 ENCOUNTER — Telehealth: Payer: Self-pay | Admitting: Family Medicine

## 2017-04-09 DIAGNOSIS — E875 Hyperkalemia: Secondary | ICD-10-CM

## 2017-04-09 NOTE — Telephone Encounter (Signed)
BP Readings from Last 3 Encounters:  04/01/17 138/72  09/29/16 136/82  03/31/16 115/80   Called pt- we likely need to alter her BP regimen, change to lisinopril/ hctz.  Did not reach, will try her back

## 2017-04-10 MED ORDER — LISINOPRIL-HYDROCHLOROTHIAZIDE 10-12.5 MG PO TABS: 1.0000 | ORAL_TABLET | Freq: Every day | ORAL | 6 refills | Status: DC

## 2017-04-10 NOTE — Telephone Encounter (Signed)
Called and spoke to [place], her grand-son.  He also spoke with pt who was in the room We will change from lisinopril 20 to lisinopril 10/hctz12.5 to balance her K.  She is coming in next week for a dexa scan and we will check her BMP then

## 2017-04-14 ENCOUNTER — Ambulatory Visit (HOSPITAL_BASED_OUTPATIENT_CLINIC_OR_DEPARTMENT_OTHER)
Admission: RE | Admit: 2017-04-14 | Discharge: 2017-04-14 | Disposition: A | Discharge status: Home / Self Care | Payer: Medicare Other | Source: Ambulatory Visit | Attending: Family Medicine | Admitting: Family Medicine

## 2017-04-14 ENCOUNTER — Other Ambulatory Visit (INDEPENDENT_AMBULATORY_CARE_PROVIDER_SITE_OTHER): Payer: Medicare Other

## 2017-04-14 DIAGNOSIS — M810 Age-related osteoporosis without current pathological fracture: Secondary | ICD-10-CM | POA: Diagnosis not present

## 2017-04-14 DIAGNOSIS — E2839 Other primary ovarian failure: Secondary | ICD-10-CM | POA: Diagnosis not present

## 2017-04-14 DIAGNOSIS — Z13820 Encounter for screening for osteoporosis: Secondary | ICD-10-CM | POA: Diagnosis not present

## 2017-04-14 DIAGNOSIS — E875 Hyperkalemia: Secondary | ICD-10-CM | POA: Diagnosis not present

## 2017-04-14 DIAGNOSIS — Z780 Asymptomatic menopausal state: Secondary | ICD-10-CM | POA: Diagnosis not present

## 2017-04-14 LAB — BASIC METABOLIC PANEL
BUN / CREAT RATIO: 18 (calc) (ref 6–22)
BUN: 8 mg/dL (ref 7–25)
CHLORIDE: 98 mmol/L (ref 98–110)
CO2: 28 mmol/L (ref 20–32)
CREATININE: 0.44 mg/dL — AB (ref 0.60–0.88)
Calcium: 9.1 mg/dL (ref 8.6–10.4)
Glucose, Bld: 95 mg/dL (ref 65–99)
POTASSIUM: 4.1 mmol/L (ref 3.5–5.3)
Sodium: 130 mmol/L — ABNORMAL LOW (ref 135–146)

## 2017-04-15 ENCOUNTER — Telehealth: Payer: Self-pay | Admitting: Family Medicine

## 2017-04-15 ENCOUNTER — Encounter: Payer: Self-pay | Admitting: Family Medicine

## 2017-04-15 DIAGNOSIS — E871 Hypo-osmolality and hyponatremia: Secondary | ICD-10-CM

## 2017-04-15 DIAGNOSIS — M810 Age-related osteoporosis without current pathological fracture: Secondary | ICD-10-CM

## 2017-04-15 HISTORY — DX: Age-related osteoporosis without current pathological fracture: M81.0

## 2017-04-15 MED ORDER — ALENDRONATE SODIUM 70 MG PO TABS: 70.0000 mg | ORAL_TABLET | ORAL | 11 refills | Status: DC

## 2017-04-15 NOTE — Telephone Encounter (Signed)
Called pt to go over her bone density scan and labs Spoke to her grandson [name] per her request.  He will discuss with her, but I will go ahead and rx fosamax for her We had recently changed her from lisinopril 20 to lisinopril10/hctz 12.5 due to hyperkalemia. Her potassium is back to normal but now her Na is low.  She will watch her free water intake- only drink if thirsty- and plan to repeat a BMP in early January as a lab visit only   Dg Bone Density  Result Date: 04/14/2017 EXAM: DUAL X-RAY ABSORPTIOMETRY (DXA) FOR BONE MINERAL DENSITY IMPRESSION: Referring Physician:  [name] PATIENT: Name: Gail, [name] Patient ID: [id number] Birth Date: 06/24/1931 Height: 67.0 in. Sex: Female Measured: 04/14/2017 Weight: 130.2 lbs. Indications: Advanced Age, Caucasian, Estrogen Deficiency, Hysterectomy, Post Menopausal Fractures: Treatments: ASSESSMENT: The BMD measured at Femur Total Left is 0.678 g/cm2 with a T-score of -2.6. This patient is considered osteoporotic according to [facility]) criteria. Lumbar spine was not utilized due to advanced degenerative changes. Patient has a prothesis from knee down on the left. Site Region Measured Date Measured Age WHO YA BMD Classification T-score DualFemur Total Left 04/14/2017 83.3 years Osteoporosis -2.6 0.678 g/cm2 Left Forearm Radius 33% 04/14/2017 83.3 Osteopenia -2.0 0.700 g/cm2 World Health Organization [facility]) criteria for post-menopausal, Caucasian Women: Normal       T-score at or above -1 SD Osteopenia   T-score between -1 and -2.5 SD Osteoporosis T-score at or below -2.5 SD RECOMMENDATION: [place] recommends that FDA-approved medical therapies be considered in postmenopausal women and men age 31 or older with a: 1. Hip or vertebral (clinical or morphometric) fracture. 2. T-score of < -2.5 at the spine or hip. 3. Ten-year fracture probability by FRAX of 3% or greater for hip fracture or 20% or greater for major  osteoporotic fracture. All treatment decisions require clinical judgment and consideration of individual patient factors, including patient preferences, co-morbidities, previous drug use, risk factors not captured in the FRAX model (e.g. falls, vitamin D deficiency, increased bone turnover, interval significant decline in bone density) and possible under - or over-estimation of fracture risk by FRAX. All patients should ensure an adequate intake of dietary calcium (1200 mg/d) and vitamin D (800 IU daily) unless contraindicated. FOLLOW-UP: People with diagnosed cases of osteoporosis or at high risk for fracture should have regular bone mineral density tests. For patients eligible for Medicare, routine testing is allowed once every 2 years. The testing frequency can be increased to one year for patients who have rapidly progressing disease, those who are receiving or discontinuing medical therapy to restore bone mass, or have additional risk factors. I have reviewed this report and agree with the above findings. [facility] Electronically Signed   By: Lowella Grip III M.D.   On: 04/14/2017 10:04   Results for orders placed or performed in visit on 58/09/98  Basic metabolic panel  Result Value Ref Range   Glucose, Bld 95 65 - 99 mg/dL   BUN 8 7 - 25 mg/dL   Creat 0.44 (L) 0.60 - 0.88 mg/dL   BUN/Creatinine Ratio 18 6 - 22 (calc)   Sodium 130 (L) 135 - 146 mmol/L   Potassium 4.1 3.5 - 5.3 mmol/L   Chloride 98 98 - 110 mmol/L   CO2 28 20 - 32 mmol/L   Calcium 9.1 8.6 - 10.4 mg/dL

## 2017-05-11 ENCOUNTER — Encounter: Payer: Self-pay | Admitting: Family Medicine

## 2017-05-11 NOTE — Telephone Encounter (Signed)
Called [name] back- he has noted that his grandmother [name] is having body aches and not feeing well after her weekly fosamax dose. She will stop the fosamax. She is s/p a BKA for painful arterial disease, and noted that her remaining leg has also ached more since using the fosamax. However she does not have any skin breakdown or wound and attributes this to the fosamax.  Advised that if any worsening or severe sx this needs to be looked at right away, same if sx do not resolve with stopping the fosamax

## 2017-05-11 NOTE — Telephone Encounter (Signed)
Patient's grandson reports patient has been on Alendronate for one month and has been complaining of generalized body aches for the last 2 weeks. She is due for next tablet Thursday 05-14-17, he will hold off medication until he hears back from [place]. Please advise.

## 2017-05-28 ENCOUNTER — Telehealth: Payer: Self-pay | Admitting: Family Medicine

## 2017-05-28 ENCOUNTER — Encounter: Payer: Self-pay | Admitting: Family Medicine

## 2017-05-28 NOTE — Telephone Encounter (Signed)
Copied from [place]. Topic: Quick Communication - See Telephone Encounter >> May 28, 2017  1:35 PM Bea Graff, NT wrote: CRM for notification. See Telephone encounter for: Pts grandson [name] calling wanting to speak with Dr. Lorelei Pont regarding his grandmother. CB#: [phone]  05/28/17.

## 2017-05-28 NOTE — Telephone Encounter (Signed)
Called [name] back- she actually feels like her foot and leg are ok, betrer than they have been recently  She thinks [name] just had not seen her foot in a while and was not used to how it looks She is invited to see me if not doing ok and she agrees to do so

## 2017-05-28 NOTE — Telephone Encounter (Signed)
Called home number- no answer.  Called the cell number and reached her grandson [name].  He reports that her remaining leg is swollen, dark and dusky, and that the skin seems to be peeling off Advised him to take [name] to the ER at [facility] as she likely needs a vascular surgery evaluation asap

## 2017-06-15 ENCOUNTER — Emergency Department (HOSPITAL_COMMUNITY): Payer: Medicare Other

## 2017-06-15 ENCOUNTER — Inpatient Hospital Stay (HOSPITAL_COMMUNITY)
Admission: EM | Admit: 2017-06-15 | Discharge: 2017-06-19 | DRG: 240 | Disposition: A | Discharge status: Skilled Nursing Facility | Payer: Medicare Other | Attending: Internal Medicine | Admitting: Internal Medicine

## 2017-06-15 ENCOUNTER — Encounter (HOSPITAL_COMMUNITY): Payer: Self-pay | Admitting: Internal Medicine

## 2017-06-15 DIAGNOSIS — E871 Hypo-osmolality and hyponatremia: Secondary | ICD-10-CM | POA: Diagnosis not present

## 2017-06-15 DIAGNOSIS — S88019A Complete traumatic amputation at knee level, unspecified lower leg, initial encounter: Secondary | ICD-10-CM | POA: Diagnosis not present

## 2017-06-15 DIAGNOSIS — I998 Other disorder of circulatory system: Secondary | ICD-10-CM | POA: Diagnosis not present

## 2017-06-15 DIAGNOSIS — Z89511 Acquired absence of right leg below knee: Secondary | ICD-10-CM | POA: Diagnosis not present

## 2017-06-15 DIAGNOSIS — I10 Essential (primary) hypertension: Secondary | ICD-10-CM | POA: Diagnosis present

## 2017-06-15 DIAGNOSIS — Z89611 Acquired absence of right leg above knee: Secondary | ICD-10-CM | POA: Diagnosis not present

## 2017-06-15 DIAGNOSIS — Z8249 Family history of ischemic heart disease and other diseases of the circulatory system: Secondary | ICD-10-CM | POA: Diagnosis not present

## 2017-06-15 DIAGNOSIS — R2689 Other abnormalities of gait and mobility: Secondary | ICD-10-CM | POA: Diagnosis not present

## 2017-06-15 DIAGNOSIS — I739 Peripheral vascular disease, unspecified: Secondary | ICD-10-CM | POA: Diagnosis not present

## 2017-06-15 DIAGNOSIS — E440 Moderate protein-calorie malnutrition: Secondary | ICD-10-CM

## 2017-06-15 DIAGNOSIS — R278 Other lack of coordination: Secondary | ICD-10-CM | POA: Diagnosis not present

## 2017-06-15 DIAGNOSIS — L03119 Cellulitis of unspecified part of limb: Secondary | ICD-10-CM | POA: Diagnosis not present

## 2017-06-15 DIAGNOSIS — I70261 Atherosclerosis of native arteries of extremities with gangrene, right leg: Secondary | ICD-10-CM | POA: Diagnosis not present

## 2017-06-15 DIAGNOSIS — M79671 Pain in right foot: Secondary | ICD-10-CM | POA: Diagnosis not present

## 2017-06-15 DIAGNOSIS — R41841 Cognitive communication deficit: Secondary | ICD-10-CM | POA: Diagnosis not present

## 2017-06-15 DIAGNOSIS — Z7902 Long term (current) use of antithrombotics/antiplatelets: Secondary | ICD-10-CM | POA: Diagnosis not present

## 2017-06-15 DIAGNOSIS — M810 Age-related osteoporosis without current pathological fracture: Secondary | ICD-10-CM | POA: Diagnosis not present

## 2017-06-15 DIAGNOSIS — M6281 Muscle weakness (generalized): Secondary | ICD-10-CM | POA: Diagnosis not present

## 2017-06-15 DIAGNOSIS — M7989 Other specified soft tissue disorders: Secondary | ICD-10-CM | POA: Diagnosis not present

## 2017-06-15 DIAGNOSIS — I96 Gangrene, not elsewhere classified: Secondary | ICD-10-CM | POA: Diagnosis not present

## 2017-06-15 DIAGNOSIS — S8990XA Unspecified injury of unspecified lower leg, initial encounter: Secondary | ICD-10-CM | POA: Diagnosis not present

## 2017-06-15 DIAGNOSIS — Z90710 Acquired absence of both cervix and uterus: Secondary | ICD-10-CM

## 2017-06-15 DIAGNOSIS — Z89512 Acquired absence of left leg below knee: Secondary | ICD-10-CM

## 2017-06-15 DIAGNOSIS — L0390 Cellulitis, unspecified: Secondary | ICD-10-CM

## 2017-06-15 DIAGNOSIS — L03115 Cellulitis of right lower limb: Secondary | ICD-10-CM | POA: Diagnosis not present

## 2017-06-15 DIAGNOSIS — Z6820 Body mass index (BMI) 20.0-20.9, adult: Secondary | ICD-10-CM | POA: Diagnosis not present

## 2017-06-15 DIAGNOSIS — Z79899 Other long term (current) drug therapy: Secondary | ICD-10-CM

## 2017-06-15 LAB — CBC WITH DIFFERENTIAL/PLATELET
BASOS ABS: 0 10*3/uL (ref 0.0–0.1)
BASOS PCT: 0 %
Basophils Absolute: 0 10*3/uL (ref 0.0–0.1)
Basophils Relative: 0 %
EOS PCT: 0 %
Eosinophils Absolute: 0 10*3/uL (ref 0.0–0.7)
Eosinophils Absolute: 0.1 10*3/uL (ref 0.0–0.7)
Eosinophils Relative: 1 %
HEMATOCRIT: 31.1 % — AB (ref 36.0–46.0)
HEMATOCRIT: 31.5 % — AB (ref 36.0–46.0)
Hemoglobin: 10.7 g/dL — ABNORMAL LOW (ref 12.0–15.0)
Hemoglobin: 11.1 g/dL — ABNORMAL LOW (ref 12.0–15.0)
LYMPHS ABS: 1.6 10*3/uL (ref 0.7–4.0)
LYMPHS PCT: 9 %
LYMPHS PCT: 17 %
Lymphs Abs: 1.2 10*3/uL (ref 0.7–4.0)
MCH: 29.6 pg (ref 26.0–34.0)
MCH: 30.2 pg (ref 26.0–34.0)
MCHC: 34.4 g/dL (ref 30.0–36.0)
MCHC: 35.2 g/dL (ref 30.0–36.0)
MCV: 85.9 fL (ref 78.0–100.0)
MCV: 85.6 fL (ref 78.0–100.0)
MONOS PCT: 10 %
Monocytes Absolute: 1.2 10*3/uL — ABNORMAL HIGH (ref 0.1–1.0)
Monocytes Absolute: 1 10*3/uL (ref 0.1–1.0)
Monocytes Relative: 10 %
NEUTROS ABS: 10.4 10*3/uL — AB (ref 1.7–7.7)
NEUTROS ABS: 6.9 10*3/uL (ref 1.7–7.7)
NEUTROS PCT: 72 %
Neutrophils Relative %: 81 %
PLATELETS: 418 10*3/uL — AB (ref 150–400)
PLATELETS: 406 10*3/uL — AB (ref 150–400)
RBC: 3.62 MIL/uL — ABNORMAL LOW (ref 3.87–5.11)
RBC: 3.68 MIL/uL — AB (ref 3.87–5.11)
RDW: 13.4 % (ref 11.5–15.5)
RDW: 13.5 % (ref 11.5–15.5)
WBC: 12.9 10*3/uL — ABNORMAL HIGH (ref 4.0–10.5)
WBC: 9.6 10*3/uL (ref 4.0–10.5)

## 2017-06-15 LAB — COMPREHENSIVE METABOLIC PANEL
ALK PHOS: 62 U/L (ref 38–126)
ALT: 14 U/L (ref 14–54)
AST: 20 U/L (ref 15–41)
Albumin: 2.9 g/dL — ABNORMAL LOW (ref 3.5–5.0)
Anion gap: 8 (ref 5–15)
BUN: 11 mg/dL (ref 6–20)
CALCIUM: 8.5 mg/dL — AB (ref 8.9–10.3)
CO2: 25 mmol/L (ref 22–32)
CREATININE: 0.52 mg/dL (ref 0.44–1.00)
Chloride: 97 mmol/L — ABNORMAL LOW (ref 101–111)
GFR calc Af Amer: >60 mL/min (ref >60)
GFR calc non Af Amer: >60 mL/min (ref >60)
GLUCOSE: 151 mg/dL — AB (ref 65–99)
Potassium: 3.6 mmol/L (ref 3.5–5.1)
Sodium: 130 mmol/L — ABNORMAL LOW (ref 135–145)
Total Bilirubin: 0.4 mg/dL (ref 0.3–1.2)
Total Protein: 6.3 g/dL — ABNORMAL LOW (ref 6.5–8.1)

## 2017-06-15 LAB — BASIC METABOLIC PANEL
Anion gap: 8 (ref 5–15)
BUN: 8 mg/dL (ref 6–20)
CHLORIDE: 99 mmol/L — AB (ref 101–111)
CO2: 27 mmol/L (ref 22–32)
Calcium: 8.7 mg/dL — ABNORMAL LOW (ref 8.9–10.3)
Creatinine, Ser: 0.44 mg/dL (ref 0.44–1.00)
GFR calc Af Amer: >60 mL/min (ref >60)
Glucose, Bld: 100 mg/dL — ABNORMAL HIGH (ref 65–99)
POTASSIUM: 3.7 mmol/L (ref 3.5–5.1)
SODIUM: 134 mmol/L — AB (ref 135–145)

## 2017-06-15 LAB — SEDIMENTATION RATE: Sed Rate: 46 mm/hr — ABNORMAL HIGH (ref 0–22)

## 2017-06-15 LAB — I-STAT CG4 LACTIC ACID, ED: Lactic Acid, Venous: 1.02 mmol/L (ref 0.5–1.9)

## 2017-06-15 MED ORDER — CEFEPIME HCL 2 G IJ SOLR
2.0000 g | INTRAMUSCULAR | Status: AC
  Administered 2017-06-15: 2 g via INTRAVENOUS
  Filled 2017-06-15: qty 2

## 2017-06-15 MED ORDER — MORPHINE SULFATE (PF) 4 MG/ML IV SOLN
4.0000 mg | Freq: Once | INTRAVENOUS | Status: AC
  Administered 2017-06-15: 4 mg via INTRAVENOUS
  Filled 2017-06-15: qty 1

## 2017-06-15 MED ORDER — CLOPIDOGREL BISULFATE 75 MG PO TABS
75.0000 mg | ORAL_TABLET | Freq: Every day | ORAL | Status: DC
  Administered 2017-06-16 – 2017-06-19 (×3): 75 mg via ORAL
  Filled 2017-06-15 (×3): qty 1

## 2017-06-15 MED ORDER — LISINOPRIL-HYDROCHLOROTHIAZIDE 10-12.5 MG PO TABS: 1.0000 | ORAL_TABLET | Freq: Every day | ORAL | Status: DC

## 2017-06-15 MED ORDER — VANCOMYCIN HCL 10 G IV SOLR
1250.0000 mg | INTRAVENOUS | Status: AC
  Administered 2017-06-15: 1250 mg via INTRAVENOUS
  Filled 2017-06-15: qty 1250

## 2017-06-15 MED ORDER — HYDROCHLOROTHIAZIDE 12.5 MG PO CAPS
12.5000 mg | ORAL_CAPSULE | Freq: Every day | ORAL | Status: DC
  Administered 2017-06-16 – 2017-06-17 (×2): 12.5 mg via ORAL
  Filled 2017-06-15 (×2): qty 1

## 2017-06-15 MED ORDER — DEXTROSE 5 % IV SOLN
2.0000 g | Freq: Two times a day (BID) | INTRAVENOUS | Status: DC
  Administered 2017-06-16 – 2017-06-18 (×5): 2 g via INTRAVENOUS
  Filled 2017-06-15 (×5): qty 2

## 2017-06-15 MED ORDER — LISINOPRIL 10 MG PO TABS
10.0000 mg | ORAL_TABLET | Freq: Every day | ORAL | Status: DC
  Administered 2017-06-16 – 2017-06-19 (×4): 10 mg via ORAL
  Filled 2017-06-15 (×4): qty 1

## 2017-06-15 MED ORDER — VANCOMYCIN HCL IN DEXTROSE 750-5 MG/150ML-% IV SOLN
750.0000 mg | INTRAVENOUS | Status: DC
  Administered 2017-06-16 – 2017-06-17 (×2): 750 mg via INTRAVENOUS
  Filled 2017-06-15 (×2): qty 150

## 2017-06-15 MED ORDER — METRONIDAZOLE IN NACL 5-0.79 MG/ML-% IV SOLN
500.0000 mg | Freq: Three times a day (TID) | INTRAVENOUS | Status: DC
  Administered 2017-06-15 – 2017-06-17 (×5): 500 mg via INTRAVENOUS
  Filled 2017-06-15 (×6): qty 100

## 2017-06-15 MED ORDER — ONDANSETRON HCL 4 MG PO TABS: 4.0000 mg | ORAL_TABLET | Freq: Four times a day (QID) | ORAL | Status: DC | PRN

## 2017-06-15 MED ORDER — ONDANSETRON HCL 4 MG/2ML IJ SOLN
4.0000 mg | Freq: Four times a day (QID) | INTRAMUSCULAR | Status: DC | PRN
  Administered 2017-06-19: 4 mg via INTRAVENOUS
  Filled 2017-06-15: qty 2

## 2017-06-15 MED ORDER — SODIUM CHLORIDE 0.9 % IV SOLN
INTRAVENOUS | Status: AC
  Administered 2017-06-15: 17:00:00 via INTRAVENOUS

## 2017-06-15 MED ORDER — METRONIDAZOLE IN NACL 5-0.79 MG/ML-% IV SOLN
500.0000 mg | Freq: Three times a day (TID) | INTRAVENOUS | Status: DC
Start: 2017-06-15 — End: 2017-06-15

## 2017-06-15 MED ORDER — ONDANSETRON HCL 4 MG/2ML IJ SOLN
4.0000 mg | Freq: Once | INTRAMUSCULAR | Status: AC
  Administered 2017-06-15: 4 mg via INTRAVENOUS
  Filled 2017-06-15: qty 2

## 2017-06-15 MED ORDER — CEFAZOLIN SODIUM-DEXTROSE 1-4 GM/50ML-% IV SOLN
1.0000 g | Freq: Once | INTRAVENOUS | Status: AC
  Administered 2017-06-15: 1 g via INTRAVENOUS
  Filled 2017-06-15: qty 50

## 2017-06-15 NOTE — Progress Notes (Signed)
Received patient from [facility] via carelink. Alert and oriented, not in any distress. VSS. Will endorse appropriately.

## 2017-06-15 NOTE — Consult Note (Addendum)
Green Nurse wound consult note Reason for Consult:Right foot (from midfoot to phalanges) is necrotic and with infection/gangrene. Wound type:Infectious Pressure Injury POA: N/A Measurement: N/A Wound bed: Black avascular great toe and adjacent digits. Anterior foot with erythema, yellow discoloration indicative of infection.  LE with edema to pretibial area Drainage (amount, consistency, odor) None Periwound: erythematous, edematous  Dressing procedure/placement/frequency: Patient with BKA of left LE.  States "I am ready for this foot to go."  Bedside RN reports that patient is to be transferred to [facility] for evaluation by Dr. Gwenlyn Saran of  [facility]. Previous amputation was performed by that practice. No role for WOC Nursing or topical care at this time.  [place] nursing team will not follow, but will remain available to this patient, the nursing and medical teams.  Please re-consult if needed. Thanks, Maudie Flakes, MSN, RN, [place], Arther Abbott  Pager# [phone]

## 2017-06-15 NOTE — Consult Note (Signed)
Hospital Consult    Reason for Consult:  Necrosis and infection of right foot Referring Physician:  [facility] ED MRN #:  [id number]  History of Present Illness: This is a 84 y.o. female with history of left bka now with 2 weeks of worsening discoloration of her right toes and erythema. She denies fevers or chills. Presented to ED for erythema. Pain is well controlled now. Has known about lack of blood flow since Dr. Oneida Alar angiogram over 2 years ago. States she would rather have amputation that deal with wound care again Takes plavix but no blood thinners.  Past Medical History:  Diagnosis Date  . Arthritis   . Cancer ([place])    utertrine  . Hypertension   . Osteoporosis 04/15/2017  . PAD (peripheral artery disease) ([place])     Past Surgical History:  Procedure Laterality Date  . ABDOMINAL HYSTERECTOMY     partial  . AMPUTATION Left 01/29/2015   Procedure: Left  AMPUTATION BELOW KNEE;  Surgeon: Elam Dutch, MD;  Location: [place];  Service: Vascular;  Laterality: Left;  . KNEE SURGERY Right 1984  . PERIPHERAL VASCULAR CATHETERIZATION N/A 09/15/2014   Procedure: Abdominal Aortogram;  Surgeon: Elam Dutch, MD;  Location: [facility] INVASIVE CV LAB CUPID;  Service: Cardiovascular;  Laterality: N/A;  . PERIPHERAL VASCULAR CATHETERIZATION Bilateral 09/15/2014   Procedure: Lower Extremity Angiography;  Surgeon: Elam Dutch, MD;  Location: [place] INVASIVE CV LAB CUPID;  Service: Cardiovascular;  Laterality: Bilateral;  . SPINE SURGERY      No Known Allergies  Prior to Admission medications   Medication Sig Start Date End Date Taking? Authorizing Provider  clopidogrel (PLAVIX) 75 MG tablet Take 1 tablet (75 mg total) by mouth daily. 04/01/17  Yes Copland, Gay Filler, MD  ibuprofen (ADVIL,MOTRIN) 200 MG tablet Take 600 mg by mouth every 6 (six) hours as needed (For pain.).   Yes [provider]  lisinopril-hydrochlorothiazide (PRINZIDE,ZESTORETIC) 10-12.5 MG tablet Take 1 tablet by mouth  daily. 04/10/17  Yes Copland, Gay Filler, MD  omeprazole (PRILOSEC) 20 MG capsule Take 20 mg by mouth daily.   Yes [provider]  Polyethyl Glycol-Propyl Glycol (SYSTANE FREE OP) Place 1 drop into both eyes daily. For dry eyes   Yes [provider]    Social History   Socioeconomic History  . Marital status: Widowed    Spouse name: Not on file  . Number of children: Not on file  . Years of education: Not on file  . Highest education level: Not on file  Social Needs  . Financial resource strain: Not on file  . Food insecurity - worry: Not on file  . Food insecurity - inability: Not on file  . Transportation needs - medical: Not on file  . Transportation needs - non-medical: Not on file  Occupational History  . Not on file  Tobacco Use  . Smoking status: Never Smoker  . Smokeless tobacco: Never Used  Substance and Sexual Activity  . Alcohol use: No    Alcohol/week: 0.0 oz  . Drug use: No  . Sexual activity: Not on file  Other Topics Concern  . Not on file  Social History Narrative  . Not on file     Family History  Problem Relation Age of Onset  . Diabetes Mother   . Heart disease Mother   . Dementia Sister   . Diabetes Son     ROS: [x]  Positive   [ ]  Negative   [ ]  All  sytems reviewed and are negative  Cardiovascular: []  chest pain/pressure []  palpitations []  SOB lying flat []  DOE []  pain in legs while walking []  pain in legs at rest []  pain in legs at night x] non-healing ulcers []  hx of DVT [x]  swelling in legs  Pulmonary: []  productive cough []  asthma/wheezing []  home O2  Neurologic: []  weakness in []  arms []  legs []  numbness in []  arms []  legs []  hx of CVA []  mini stroke [] difficulty speaking or slurred speech []  temporary loss of vision in one eye []  dizziness  Hematologic: []  hx of cancer []  bleeding problems []  problems with blood clotting easily  Endocrine:   []  diabetes []  thyroid disease  GI []  vomiting  blood []  blood in stool  GU: []  CKD/renal failure []  HD--[]  M/W/F or []  T/T/S []  burning with urination []  blood in urine  Psychiatric: []  anxiety []  depression  Musculoskeletal: []  arthritis []  joint pain  Integumentary: [x]  rashes [x]  ulcers  Constitutional: []  fever []  chills   Physical Examination  Vitals:   06/15/17 1746 06/15/17 1854  BP: 130/72 128/71  Pulse: 81 85  Resp: 16 18  Temp:  (!) 97 F (36.1 C)  SpO2: 97% 98%   Body mass index is 20.36 kg/m.  General:  WDWN in NAD HENT: WNL, normocephalic Pulmonary: normal non-labored breathing Cardiac: 2+ bilateral femoral pulses Weakly palpable right popliteal pulse Abdomen: soft, NT/ND, no masses Extremities:      Neurologic: A&O X 3; she can move her foot but much of it is numb Psychiatric:  Appropriate mood and affect  CBC    Component Value Date/Time   WBC 9.6 06/15/2017 1547   RBC 3.68 (L) 06/15/2017 1547   HGB 11.1 (L) 06/15/2017 1547   HCT 31.5 (L) 06/15/2017 1547   PLT 406 (H) 06/15/2017 1547   MCV 85.6 06/15/2017 1547   MCV 90.7 08/22/2014 1313   MCH 30.2 06/15/2017 1547   MCHC 35.2 06/15/2017 1547   RDW 13.5 06/15/2017 1547   LYMPHSABS 1.6 06/15/2017 1547   MONOABS 1.0 06/15/2017 1547   EOSABS 0.1 06/15/2017 1547   BASOSABS 0.0 06/15/2017 1547    BMET    Component Value Date/Time   NA 134 (L) 06/15/2017 1547   K 3.7 06/15/2017 1547   CL 99 (L) 06/15/2017 1547   CO2 27 06/15/2017 1547   GLUCOSE 100 (H) 06/15/2017 1547   BUN 8 06/15/2017 1547   CREATININE 0.44 06/15/2017 1547   CREATININE 0.44 (L) 04/14/2017 0859   CALCIUM 8.7 (L) 06/15/2017 1547   GFRNONAA >60 06/15/2017 1547   GFRAA >60 06/15/2017 1547    COAGS: No results found for: INR, PROTIME   Non-Invasive Vascular Imaging:   No studies   ASSESSMENT/PLAN: This is a 84 y.o. female with angiogram a few years ago demonstrating severe tibial disease bilaterally and subsequently underwent left bka. She does walk  with a prosthetic. Unfortunately her right foot is likely non-salvageable and any attempt would need revascularization and require partial foot amputation with wound care to follow. At this time she is electing primary proximal leg amputation without attempt at revascularization first and I support her decision as does her grandson present at bedside. Ok to eat now but I have asked her not to eat after midnight in preparation for possible surgery tomorrow based on appearance of leg and surgical schedule. She demonstrates good understanding.   Jaleeah Slight C. Donzetta Matters, MD [facility] Office: [phone] Pager: [phone]

## 2017-06-15 NOTE — H&P (Signed)
History and Physical    [name]:[id number] DOB: 03-06-1932 DOA: 06/15/2017  PCP: [name], MD  Patient coming from: Home  Chief Complaint: Right foot getting worse and red   HPI: [name] is a 84 y.o. female with medical history significant  of worsening severe peripheral arterial disease comes in with over 2 weeks of worsening right lower extremity foot turning black and red.  Patient reports that her toes have been been becoming black for about a month now.  Over the last week it started to turn red.  Patient states that she is not come to the emergency department because it has been raining.  She has been told that this will have to be amputated.  Patient's first 3 toes are very necrotic forefoot is gangrenous with cellulitis extending up to past her ankle.  Patient denies any nausea vomiting diarrhea patient denies any fevers or chills.  Patient is referred for admission for the need for likely amputation.  Dr. Gwenlyn Saran at [facility] has been called and wishes for the patient to be transferred there for his evaluation.  Review of Systems: As per HPI otherwise 10 point review of systems negative.   Past Medical History:  Diagnosis Date  . Arthritis   . Cancer ([place])    utertrine  . Hypertension   . Osteoporosis 04/15/2017  . PAD (peripheral artery disease) ([place])     Past Surgical History:  Procedure Laterality Date  . ABDOMINAL HYSTERECTOMY     partial  . AMPUTATION Left 01/29/2015   Procedure: Left  AMPUTATION BELOW KNEE;  Surgeon: Elam Dutch, MD;  Location: [place];  Service: Vascular;  Laterality: Left;  . KNEE SURGERY Right 1984  . PERIPHERAL VASCULAR CATHETERIZATION N/A 09/15/2014   Procedure: Abdominal Aortogram;  Surgeon: Elam Dutch, MD;  Location: [facility] INVASIVE CV LAB CUPID;  Service: Cardiovascular;  Laterality: N/A;  . PERIPHERAL VASCULAR CATHETERIZATION Bilateral 09/15/2014   Procedure: Lower Extremity Angiography;  Surgeon: Elam Dutch, MD;  Location: [place] INVASIVE CV LAB CUPID;  Service: Cardiovascular;  Laterality: Bilateral;  . SPINE SURGERY       reports that  has never smoked. she has never used smokeless tobacco. She reports that she does not drink alcohol or use drugs.  No Known Allergies  Family History  Problem Relation Age of Onset  . Diabetes Mother   . Heart disease Mother   . Dementia Sister   . Diabetes Son     Prior to Admission medications   Medication Sig Start Date End Date Taking? Authorizing Provider  clopidogrel (PLAVIX) 75 MG tablet Take 1 tablet (75 mg total) by mouth daily. 04/01/17  Yes Copland, Gay Filler, MD  ibuprofen (ADVIL,MOTRIN) 200 MG tablet Take 600 mg by mouth every 6 (six) hours as needed (For pain.).   Yes [provider]  lisinopril-hydrochlorothiazide (PRINZIDE,ZESTORETIC) 10-12.5 MG tablet Take 1 tablet by mouth daily. 04/10/17  Yes Copland, Gay Filler, MD  omeprazole (PRILOSEC) 20 MG capsule Take 20 mg by mouth daily.   Yes [provider]  Polyethyl Glycol-Propyl Glycol (SYSTANE FREE OP) Place 1 drop into both eyes daily. For dry eyes   Yes [provider]    Physical Exam: Vitals:   06/15/17 1155 06/15/17 1158 06/15/17 1346 06/15/17 1349  BP:  118/75 113/62 113/62  Pulse:  (!) 111 (!) 32 82  Resp:  16  16  Temp:  97.6 F (36.4 C)  97.6 F (36.4 C)  TempSrc:  Oral  Oral  SpO2: 97% 96% 94% 96%      Constitutional: NAD, calm, comfortable Vitals:   06/15/17 1155 06/15/17 1158 06/15/17 1346 06/15/17 1349  BP:  118/75 113/62 113/62  Pulse:  (!) 111 (!) 32 82  Resp:  16  16  Temp:  97.6 F (36.4 C)  97.6 F (36.4 C)  TempSrc:  Oral  Oral  SpO2: 97% 96% 94% 96%   Eyes: PERRL, lids and conjunctivae normal ENMT: Mucous membranes are moist. Posterior pharynx clear of any exudate or lesions.Normal dentition.  Neck: normal, supple, no masses, no thyromegaly Respiratory: clear to auscultation bilaterally, no wheezing, no crackles.  Normal respiratory effort. No accessory muscle use.  Cardiovascular: Regular rate and rhythm, no murmurs / rubs / gallops. No extremity edema. No carotid bruits.  Pedal pulses decreased in right lower extremity Abdomen: no tenderness, no masses palpated. No hepatosplenomegaly. Bowel sounds positive.  Musculoskeletal: no clubbing .  Left amputation already right foot gangrenous with cellulitis up past ankle good ROM, no contractures. Normal muscle tone.  Skin: no rashes, lesions, ulcers. No induration except what stated as above Neurologic: CN 2-12 grossly intact. Sensation intact, DTR normal. Strength 5/5 in all 4.  Psychiatric: Normal judgment and insight. Alert and oriented x 3. Normal mood.    Labs on Admission: I have personally reviewed following labs and imaging studies  CBC: Recent Labs  Lab 06/15/17 1239  WBC 12.9*  NEUTROABS 10.4*  HGB 10.7*  HCT 31.1*  MCV 85.9  PLT 947*   Basic Metabolic Panel: Recent Labs  Lab 06/15/17 1239  NA 130*  K 3.6  CL 97*  CO2 25  GLUCOSE 151*  BUN 11  CREATININE 0.52  CALCIUM 8.5*   GFR: CrCl cannot be calculated (Unknown ideal weight.). Liver Function Tests: Recent Labs  Lab 06/15/17 1239  AST 20  ALT 14  ALKPHOS 62  BILITOT 0.4  PROT 6.3*  ALBUMIN 2.9*   No results for input(s): LIPASE, AMYLASE in the last 168 hours. No results for input(s): AMMONIA in the last 168 hours. Coagulation Profile: No results for input(s): INR, PROTIME in the last 168 hours. Cardiac Enzymes: No results for input(s): CKTOTAL, CKMB, CKMBINDEX, TROPONINI in the last 168 hours. BNP (last 3 results) No results for input(s): PROBNP in the last 8760 hours. HbA1C: No results for input(s): HGBA1C in the last 72 hours. CBG: No results for input(s): GLUCAP in the last 168 hours. Lipid Profile: No results for input(s): CHOL, HDL, LDLCALC, TRIG, CHOLHDL, LDLDIRECT in the last 72 hours. Thyroid Function Tests: No results for input(s): TSH, T4TOTAL,  FREET4, T3FREE, THYROIDAB in the last 72 hours. Anemia Panel: No results for input(s): VITAMINB12, FOLATE, FERRITIN, TIBC, IRON, RETICCTPCT in the last 72 hours. Urine analysis: No results found for: COLORURINE, APPEARANCEUR, LABSPEC, PHURINE, GLUCOSEU, HGBUR, BILIRUBINUR, KETONESUR, PROTEINUR, UROBILINOGEN, NITRITE, LEUKOCYTESUR Sepsis Labs: !!!!!!!!!!!!!!!!!!!!!!!!!!!!!!!!!!!!!!!!!!!! @LABRCNTIP (procalcitonin:4,lacticidven:4) )No results found for this or any previous visit (from the past 240 hour(s)).   Radiological Exams on Admission: Dg Foot Complete Right  Result Date: 06/15/2017 CLINICAL DATA:  Soft tissue necrosis EXAM: RIGHT FOOT COMPLETE - 3+ VIEW COMPARISON:  None. FINDINGS: No acute fracture or dislocation is noted. No evidence of bony erosion is identified to suggest osteomyelitis. Diffuse soft tissue swelling is seen. IMPRESSION: Soft tissue swelling without definitive bony abnormality. Electronically Signed   By: Inez Catalina M.D.   On: 06/15/2017 13:21    Old chart reviewed Case discussed with EDP  Assessment/Plan 84 year old  female with gangrenous right foot with associated cellulitis with known severe peripheral arterial disease Principal Problem:   Cellulitis-placed on IV vancomycin and Azactam.  Patient is highly likely going to need amputation of his foot.  This is been going on for about a month now.  It is infected.  Transfer to [facility] for vascular surgery evaluation.  Patient currently not septic.  Vitals stable.  Active Problems:   Ischemic foot as above-   Osteoporosis-noted   PAD (peripheral artery disease) ([facility])-noted vascular surgery will see on arrival to come.  Keep n.p.o. until evaluated by vascular surgery.     DVT prophylaxis: None at this time  code Status: Full Family Communication: None Disposition Plan: Per day team Consults called: Vascular surgery called by ED Dr. Gwenlyn Saran aware transferring to [facility] Admission status:  Admission   Danyelle Brookover A MD [facility]  If 7PM-7AM, please contact night-coverage [email or web address] Password [facility]  06/15/2017, 3:45 PM

## 2017-06-15 NOTE — Progress Notes (Signed)
Pharmacy Antibiotic Note  PINKEY MCJUNKIN is a 84 y.o. female admitted on 06/15/2017 with gangrenous right foot with associated cellulitis. Pharmacy has been consulted for Vancomycin and Cefepime dosing. Patient also placed on Metronidazole per MD.   Plan: Vancomycin 1250mg  IV x 1, then 750mg  IV q24h. Plan for Vancomycin levels at steady state, as indicated. Cefepime 2g IV q12h. Flagyl 500mg  IV q8h per MD. Monitor renal function, cultures, clinical course.      Temp (24hrs), Avg:97.6 F (36.4 C), Min:97.6 F (36.4 C), Max:97.6 F (36.4 C)  Recent Labs  Lab 06/15/17 1239 06/15/17 1245  WBC 12.9*  --   CREATININE 0.52  --   LATICACIDVEN  --  1.02    CrCl cannot be calculated (Unknown ideal weight.).    No Known Allergies  Antimicrobials this admission: 2/4 Cefazolin x 1 2/4 Vancomycin >> 2/4 Cefepime >> 2/4 Flagyl (MD) >>  Dose adjustments this admission: --  Microbiology results: 2/4 BCx: ordered   Thank you for allowing pharmacy to be a part of this patient's care.   Lindell Spar, PharmD, BCPS Pager: [phone] 06/15/2017 4:57 PM

## 2017-06-15 NOTE — ED Provider Notes (Signed)
[facility] DEPT Provider Note   CSN: [id number] Arrival date & time: 06/15/17  1144     History   Chief Complaint Chief Complaint  Patient presents with  . Foot Pain    HPI [name] is a 84 y.o. female.  HPI [name] is a 84 y.o. female presents to emergency department complaining of infection to the right foot.  Patient states she noticed a sore to her toe several months ago.  She states that she was unable to go see her doctor since then due to bad weather and other circumstances, states that gradually the infection has spread into her foot and now into her shin.  She reports pain to that area.  She does have history of peripheral arterial disease, and has a prior left BKA due to that.  No treatment prior to coming in.  No fever or chills.  States her pain is better when the leg is hanging down, and worse when it is elevated.  No other complaints.  Past Medical History:  Diagnosis Date  . Arthritis   . Cancer ([place])    utertrine  . Hypertension   . Osteoporosis Apr 26, 2017  . PAD (peripheral artery disease) [facility])     Patient Active Problem List   Diagnosis Date Noted  . Osteoporosis 04-26-2017  . Loss or death of child 06-13-2015  . Status post below knee amputation of left lower extremity ([place]) 01/31/2015  . Non-healing wound of lower extremity 01/29/2015  . Ulcer of toe of left foot ([place]) 12/27/2014  . Essential hypertension 08/22/2014    Past Surgical History:  Procedure Laterality Date  . ABDOMINAL HYSTERECTOMY     partial  . AMPUTATION Left 01/29/2015   Procedure: Left  AMPUTATION BELOW KNEE;  Surgeon: Elam Dutch, MD;  Location: [facility];  Service: Vascular;  Laterality: Left;  . KNEE SURGERY Right 1984  . PERIPHERAL VASCULAR CATHETERIZATION N/A 09/15/2014   Procedure: Abdominal Aortogram;  Surgeon: Elam Dutch, MD;  Location: [facility] INVASIVE CV LAB CUPID;  Service: Cardiovascular;  Laterality: N/A;  .  PERIPHERAL VASCULAR CATHETERIZATION Bilateral 09/15/2014   Procedure: Lower Extremity Angiography;  Surgeon: Elam Dutch, MD;  Location: [facility] INVASIVE CV LAB CUPID;  Service: Cardiovascular;  Laterality: Bilateral;  . SPINE SURGERY      OB History    No data available       Home Medications    Prior to Admission medications   Medication Sig Start Date End Date Taking? Authorizing Provider  clopidogrel (PLAVIX) 75 MG tablet Take 1 tablet (75 mg total) by mouth daily. 04/01/17  Yes Copland, Gay Filler, MD  ibuprofen (ADVIL,MOTRIN) 200 MG tablet Take 600 mg by mouth every 6 (six) hours as needed (For pain.).   Yes [provider]  lisinopril-hydrochlorothiazide (PRINZIDE,ZESTORETIC) 10-12.5 MG tablet Take 1 tablet by mouth daily. 04/10/17  Yes Copland, Gay Filler, MD  omeprazole (PRILOSEC) 20 MG capsule Take 20 mg by mouth daily.   Yes [provider]  Polyethyl Glycol-Propyl Glycol (SYSTANE FREE OP) Place 1 drop into both eyes daily. For dry eyes   Yes [provider]    Family History Family History  Problem Relation Age of Onset  . Diabetes Mother   . Heart disease Mother   . Dementia Sister   . Diabetes Son     Social History Social History   Tobacco Use  . Smoking status: Never Smoker  . Smokeless tobacco: Never Used  Substance  Use Topics  . Alcohol use: No    Alcohol/week: 0.0 oz  . Drug use: No     Allergies   Patient has no known allergies.   Review of Systems Review of Systems  Constitutional: Negative for chills and fever.  Respiratory: Negative for cough, chest tightness and shortness of breath.   Cardiovascular: Negative for chest pain, palpitations and leg swelling.  Gastrointestinal: Negative for abdominal pain, diarrhea, nausea and vomiting.  Musculoskeletal: Positive for arthralgias. Negative for myalgias, neck pain and neck stiffness.  Skin: Positive for color change and rash.  Neurological: Negative for dizziness,  weakness, numbness and headaches.  All other systems reviewed and are negative.    Physical Exam Updated Vital Signs BP 118/75   Pulse (!) 111   Temp 97.6 F (36.4 C) (Oral)   Resp 16   SpO2 96%   Physical Exam  Constitutional: She appears well-developed and well-nourished. No distress.  HENT:  Head: Normocephalic.  Eyes: Conjunctivae are normal.  Neck: Neck supple.  Cardiovascular: Normal rate, regular rhythm and normal heart sounds.  Pulmonary/Chest: Effort normal and breath sounds normal. No respiratory distress. She has no wheezes. She has no rales.  Abdominal: Soft. Bowel sounds are normal. She exhibits no distension. There is no tenderness. There is no rebound.  Musculoskeletal: She exhibits no edema.  Gangrene to the right great toe, with erythema, swelling, macerated skin to the toes 2-4, and dorsum of the foot.  There is cellulitis extending to the mid shin circumferentially.  Tender to palpation, warm to the touch.  Doppler faint posterior tibial pulse present.  Unable to Doppler dorsalis pedis pulse.  Good popliteal pulse.  Neurological: She is alert.  Skin: Skin is warm and dry.  Psychiatric: She has a normal mood and affect. Her behavior is normal.  Nursing note and vitals reviewed.        ED Treatments / Results  Labs (all labs ordered are listed, but only abnormal results are displayed) Labs Reviewed  CBC WITH DIFFERENTIAL/PLATELET - Abnormal; Notable for the following components:      Result Value   WBC 12.9 (*)    RBC 3.62 (*)    Hemoglobin 10.7 (*)    HCT 31.1 (*)    Platelets 418 (*)    Neutro Abs 10.4 (*)    Monocytes Absolute 1.2 (*)    All other components within normal limits  COMPREHENSIVE METABOLIC PANEL  I-STAT CG4 LACTIC ACID, ED    EKG  EKG Interpretation None       Radiology Dg Foot Complete Right  Result Date: 06/15/2017 CLINICAL DATA:  Soft tissue necrosis EXAM: RIGHT FOOT COMPLETE - 3+ VIEW COMPARISON:  None. FINDINGS: No  acute fracture or dislocation is noted. No evidence of bony erosion is identified to suggest osteomyelitis. Diffuse soft tissue swelling is seen. IMPRESSION: Soft tissue swelling without definitive bony abnormality. Electronically Signed   By: Inez Catalina M.D.   On: 06/15/2017 13:21    Procedures Procedures (including critical care time)  Medications Ordered in ED Medications - No data to display   Initial Impression / Assessment and Plan / ED Course  I have reviewed the triage vital signs and the nursing notes.  Pertinent labs & imaging results that were available during my care of the patient were reviewed by me and considered in my medical decision making (see chart for details).     Patient with gangrene to the right great toe and cellulitic changes up to mid shin.  She is afebrile, nontoxic-appearing.  Will get labs, x-ray of the foot.  Dopler palpable PT and popliteal pulses. DP pulses unable to dopler. Will consult vascular surgery.  Antibiotics ordered.  Spoke with Dr. Donzetta Matters with vascular surgery, he asked for patient to be transferred to [facility], he will consult when she gets there.  Spoke with [facility], will admit.   Vitals:   06/15/17 1155 06/15/17 1158 06/15/17 1346 06/15/17 1349  BP:  118/75 113/62 113/62  Pulse:  (!) 111 (!) 32 82  Resp:  16  16  Temp:  97.6 F (36.4 C)  97.6 F (36.4 C)  TempSrc:  Oral  Oral  SpO2: 97% 96% 94% 96%     Final Clinical Impressions(s) / ED Diagnoses   Final diagnoses:  Gangrene of right foot ([place])  Cellulitis of lower extremity, unspecified laterality    ED Discharge Orders    None       Jeannett Senior, PA-C 06/15/17 1612    Lajean Saver, MD 06/16/17 [phone]

## 2017-06-15 NOTE — ED Notes (Signed)
Bed: CB83 Expected date:  Expected time:  Means of arrival:  Comments: EMS-foot pain/necrotic toes

## 2017-06-15 NOTE — ED Notes (Signed)
Vascular surgeon at the bedside.

## 2017-06-15 NOTE — ED Notes (Signed)
ED Provider at bedside. 

## 2017-06-15 NOTE — ED Triage Notes (Addendum)
Pt arrived via [facility] from home c/o right foot pain that has been increasing over the last few days. Pt called PCP. PCP advised that patient come to the ED. Pt walked with walker to ambulance. Right great toe appears necrotic. +3 pitting edema to right lower leg noted. Pt has BKA to left leg.

## 2017-06-15 NOTE — ED Notes (Signed)
[facility] notified of need for transport. °

## 2017-06-16 DIAGNOSIS — L03115 Cellulitis of right lower limb: Secondary | ICD-10-CM

## 2017-06-16 DIAGNOSIS — E440 Moderate protein-calorie malnutrition: Secondary | ICD-10-CM

## 2017-06-16 LAB — SURGICAL PCR SCREEN
MRSA, PCR: NEGATIVE
Staphylococcus aureus: NEGATIVE

## 2017-06-16 LAB — HIV ANTIBODY (ROUTINE TESTING W REFLEX): HIV Screen 4th Generation wRfx: NONREACTIVE

## 2017-06-16 MED ORDER — PRO-STAT SUGAR FREE PO LIQD
30.0000 mL | Freq: Two times a day (BID) | ORAL | Status: DC
  Administered 2017-06-16 – 2017-06-19 (×5): 30 mL via ORAL
  Filled 2017-06-16 (×5): qty 30

## 2017-06-16 MED ORDER — ADULT MULTIVITAMIN W/MINERALS CH
1.0000 | ORAL_TABLET | Freq: Every day | ORAL | Status: DC
  Administered 2017-06-16 – 2017-06-19 (×4): 1 via ORAL
  Filled 2017-06-16 (×4): qty 1

## 2017-06-16 MED ORDER — MORPHINE SULFATE (PF) 4 MG/ML IV SOLN
2.0000 mg | Freq: Once | INTRAVENOUS | Status: AC
  Administered 2017-06-16: 2 mg via INTRAVENOUS
  Filled 2017-06-16: qty 1

## 2017-06-16 NOTE — Progress Notes (Signed)
PROGRESS NOTE    [name]  DDU:[id number] DOB: 16-Apr-1932 DOA: 06/15/2017 PCP: [name], MD  Outpatient Specialists:    Brief Narrative: Patient is an 84 year old female with past medical history significant for severe peripheral artery disease, status post below-knee amputation of the left lower extremity.  Patient presents with worsening gangrene of the right lower extremity.  Right below knee amputation is planned for tomorrow, 06/17/2017.   Assessment & Plan:   Principal Problem:   Cellulitis Active Problems:   Osteoporosis   Ischemic foot   PAD (peripheral artery disease) (HCC)   Malnutrition of moderate degree   Peripheral artery disease of the right lower extremities/gangrene: -Right below-knee amputation for tomorrow, 06/17/2017. -We defer further management to the surgical team.  Cellulitis right lower extremity: -We will discontinue IV Flagyl. -Continue IV vancomycin and cefepime. -Schedule antibiotics postop. -Continue to monitor patient expectantly.  Hypertension: -This is optimize. -Continue lisinopril 10 mg p.o. once daily.  DVT prophylaxis: Subcutaneous Lovenox. Code Status: Full Family Communication: Grandson Disposition Plan: Will depend on hospital course   Consultants:   Vascular surgery  Procedures:   None  Antimicrobials:   IV vancomycin.  IV cefepime.  IV Flagyl discontinued today 06/16/2017.   Subjective: No new complaints.  No fever or chills.  Patient was asking for pain medication earlier.  Objective: Vitals:   06/15/17 1854 06/15/17 2042 06/16/17 0521 06/16/17 1408  BP: 128/71 (!) 122/54 (!) 132/57 117/67  Pulse: 85 69 72 73  Resp: 18 18 18 18   Temp: (!) 97 F (36.1 C) 98.7 F (37.1 C) 97.6 F (36.4 C) 98.1 F (36.7 C)  TempSrc: Oral Oral Oral Oral  SpO2: 98% 99% 100% 100%  Weight:        Intake/Output Summary (Last 24 hours) at 06/16/2017 1738 Last data filed at 06/16/2017 1410 Gross per 24 hour   Intake 592 ml  Output 1050 ml  Net -458 ml   Filed Weights   06/15/17 1708  Weight: 59 kg (130 lb)    Examination:  General exam: Cachectic.  Appears calm and comfortable  Respiratory system: Clear to auscultation. Respiratory effort normal. Cardiovascular system: S1 & S2 heard. Gastrointestinal system: Abdomen is nondistended, soft and nontender. No organomegaly or masses felt. Normal bowel sounds heard. Central nervous system: Alert and oriented. No focal neurological deficits. Extremities: Patient is status post left below-knee amputation.  The patient has gangrene of the right foot, with what appears to be chronic skin changes involving the right lower leg that is likely ischemic in origin.    Data Reviewed: I have personally reviewed following labs and imaging studies  CBC: Recent Labs  Lab 06/15/17 1239 06/15/17 1547  WBC 12.9* 9.6  NEUTROABS 10.4* 6.9  HGB 10.7* 11.1*  HCT 31.1* 31.5*  MCV 85.9 85.6  PLT 418* 237*   Basic Metabolic Panel: Recent Labs  Lab 06/15/17 1239 06/15/17 1547  NA 130* 134*  K 3.6 3.7  CL 97* 99*  CO2 25 27  GLUCOSE 151* 100*  BUN 11 8  CREATININE 0.52 0.44  CALCIUM 8.5* 8.7*   GFR: Estimated Creatinine Clearance: 49.6 mL/min (by C-G formula based on SCr of 0.44 mg/dL). Liver Function Tests: Recent Labs  Lab 06/15/17 1239  AST 20  ALT 14  ALKPHOS 62  BILITOT 0.4  PROT 6.3*  ALBUMIN 2.9*   No results for input(s): LIPASE, AMYLASE in the last 168 hours. No results for input(s): AMMONIA in the last 168 hours. Coagulation Profile:  No results for input(s): INR, PROTIME in the last 168 hours. Cardiac Enzymes: No results for input(s): CKTOTAL, CKMB, CKMBINDEX, TROPONINI in the last 168 hours. BNP (last 3 results) No results for input(s): PROBNP in the last 8760 hours. HbA1C: No results for input(s): HGBA1C in the last 72 hours. CBG: No results for input(s): GLUCAP in the last 168 hours. Lipid Profile: No results for  input(s): CHOL, HDL, LDLCALC, TRIG, CHOLHDL, LDLDIRECT in the last 72 hours. Thyroid Function Tests: No results for input(s): TSH, T4TOTAL, FREET4, T3FREE, THYROIDAB in the last 72 hours. Anemia Panel: No results for input(s): VITAMINB12, FOLATE, FERRITIN, TIBC, IRON, RETICCTPCT in the last 72 hours. Urine analysis: No results found for: COLORURINE, APPEARANCEUR, LABSPEC, PHURINE, GLUCOSEU, HGBUR, BILIRUBINUR, KETONESUR, PROTEINUR, UROBILINOGEN, NITRITE, LEUKOCYTESUR Sepsis Labs: @LABRCNTIP (procalcitonin:4,lacticidven:4)  ) Recent Results (from the past 240 hour(s))  Surgical PCR screen     Status: None   Collection Time: 06/15/17  8:50 PM  Result Value Ref Range Status   MRSA, PCR NEGATIVE NEGATIVE Final   Staphylococcus aureus NEGATIVE NEGATIVE Final    Comment: (NOTE) The Xpert SA Assay (FDA approved for NASAL specimens in patients 82 years of age and older), is one component of a comprehensive surveillance program. It is not intended to diagnose infection nor to guide or monitor treatment. Performed at [facility], [place] [address] [place], [place] [address]          Radiology Studies: Dg Foot Complete Right  Result Date: 06/15/2017 CLINICAL DATA:  Soft tissue necrosis EXAM: RIGHT FOOT COMPLETE - 3+ VIEW COMPARISON:  None. FINDINGS: No acute fracture or dislocation is noted. No evidence of bony erosion is identified to suggest osteomyelitis. Diffuse soft tissue swelling is seen. IMPRESSION: Soft tissue swelling without definitive bony abnormality. Electronically Signed   By: Inez Catalina M.D.   On: 06/15/2017 13:21        Scheduled Meds: . clopidogrel  75 mg Oral Daily  . feeding supplement (PRO-STAT SUGAR FREE 64)  30 mL Oral BID  . lisinopril  10 mg Oral Daily   And  . hydrochlorothiazide  12.5 mg Oral Daily  . multivitamin with minerals  1 tablet Oral Daily   Continuous Infusions: . ceFEPime (MAXIPIME) IV Stopped (06/16/17 0550)  . metronidazole 500 mg  (06/16/17 1654)  . vancomycin       LOS: 1 day    Time spent: 40 minutes.    Dana Allan, MD  [facility] Pager #: [phone] 7PM-7AM contact night coverage as above

## 2017-06-16 NOTE — Progress Notes (Signed)
Inpatient Diabetes Program Recommendations  AACE/ADA: New Consensus Statement on Inpatient Glycemic Control (2015)  Target Ranges:  Prepandial:   less than 140 mg/dL      Peak postprandial:   less than 180 mg/dL (1-2 hours)      Critically ill patients:  140 - 180 mg/dL   No results found for: GLUCAP, HGBA1C  Review of Glycemic Control  Diabetes history: None Outpatient Diabetes medications: N/A Current orders for Inpatient glycemic control: None  HgbA1C pending.  Inpatient Diabetes Program Recommendations:     Add Novolog 0-9 units tidwc.  Will follow.  Thank you. Lorenda Peck, RD, LDN, CDE Inpatient Diabetes Coordinator [phone]

## 2017-06-16 NOTE — Progress Notes (Signed)
Initial Nutrition Assessment  DOCUMENTATION CODES:   Non-severe (moderate) malnutrition in context of chronic illness  INTERVENTION:   -MVI daily - 30 ml Prostat BID, each supplement provides 100 kcals and 15 grams  NUTRITION DIAGNOSIS:   Moderate Malnutrition related to chronic illness(PAD) as evidenced by mild fat depletion, moderate fat depletion, mild muscle depletion, moderate muscle depletion.  GOAL:   Patient will meet greater than or equal to 90% of their needs  MONITOR:   PO intake, Supplement acceptance, Labs, Weight trends, Skin, I & O's  REASON FOR ASSESSMENT:   Malnutrition Screening Tool, Consult Wound healing  ASSESSMENT:   84 year old female with gangrenous right foot with associated cellulitis with known severe peripheral arterial disease  Pt admitted with rt foot gangrene.   Case discussed with RN, who confirms plan for rt BKA tomorrow (06/17/17).   Spoke with pt at bedside, who was very tangential during interview. Pt was very anxious for upcoming surgery and discussed with this RD multiple family deaths that she has experienced over the past several years and how grateful she is that her grandson is such a good support to her. She is looking forward to her grandson and great-daughter visiting later today.   Pt reports good appetite ("it's better than a 84 year old boy"). Noted meal completion 100%. Diet recall and wt hx difficult to obtain at this time. Wt has been stable over the past year. Noted mild wt loss over the past sever years; pt underwent lt BKA in 2016.   Discussed with pt importance of good meal and supplement intake to support healing. Also provided emotional support.   Labs reviewed: Na: 134  NUTRITION - FOCUSED PHYSICAL EXAM:    Most Recent Value  Orbital Region  Mild depletion  Upper Arm Region  Moderate depletion  Thoracic and Lumbar Region  No depletion  Buccal Region  Mild depletion  Temple Region  Moderate depletion  Clavicle  Bone Region  Moderate depletion  Clavicle and Acromion Bone Region  Mild depletion  Scapular Bone Region  Mild depletion  Dorsal Hand  Moderate depletion  Patellar Region  No depletion  Anterior Thigh Region  No depletion  Posterior Calf Region  No depletion  Edema (RD Assessment)  Moderate  Hair  Reviewed  Eyes  Reviewed  Mouth  Reviewed  Skin  Reviewed  Nails  Reviewed       Diet Order:  Diet NPO time specified Diet regular Room service appropriate? Yes; Fluid consistency: Thin  EDUCATION NEEDS:   Education needs have been addressed  Skin:  Skin Assessment: Skin Integrity Issues: Skin Integrity Issues:: Other (Comment) Other: rt foot gangrene  Last BM:  06/15/17  Height:   Ht Readings from Last 1 Encounters:  04/01/17 5\' 7"  (1.702 m)    Weight:   Wt Readings from Last 1 Encounters:  06/15/17 130 lb (59 kg)    Ideal Body Weight:  57.4 kg  BMI:  Body mass index is 20.36 kg/m.  Estimated Nutritional Needs:   Kcal:  1500-1700  Protein:  70-85 grams  Fluid:  1.5-1.7 L    Jacklyne Baik A. Jimmye Norman, RD, LDN, CDE Pager: [phone] After hours Pager: [phone]

## 2017-06-16 NOTE — Progress Notes (Signed)
  Progress Note    06/16/2017 7:59 AM * No surgery found *  Subjective:  Pain is well controlled, wants to have amputation  Vitals:   06/15/17 2042 06/16/17 0521  BP: (!) 122/54 (!) 132/57  Pulse: 69 72  Resp: 18 18  Temp: 98.7 F (37.1 C) 97.6 F (36.4 C)  SpO2: 99% 100%    Physical Exam: aaox3 Non labored respirations Right leg with stable gangrene, cellulitis and edema improving  CBC    Component Value Date/Time   WBC 9.6 06/15/2017 1547   RBC 3.68 (L) 06/15/2017 1547   HGB 11.1 (L) 06/15/2017 1547   HCT 31.5 (L) 06/15/2017 1547   PLT 406 (H) 06/15/2017 1547   MCV 85.6 06/15/2017 1547   MCV 90.7 08/22/2014 1313   MCH 30.2 06/15/2017 1547   MCHC 35.2 06/15/2017 1547   RDW 13.5 06/15/2017 1547   LYMPHSABS 1.6 06/15/2017 1547   MONOABS 1.0 06/15/2017 1547   EOSABS 0.1 06/15/2017 1547   BASOSABS 0.0 06/15/2017 1547    BMET    Component Value Date/Time   NA 134 (L) 06/15/2017 1547   K 3.7 06/15/2017 1547   CL 99 (L) 06/15/2017 1547   CO2 27 06/15/2017 1547   GLUCOSE 100 (H) 06/15/2017 1547   BUN 8 06/15/2017 1547   CREATININE 0.44 06/15/2017 1547   CREATININE 0.44 (L) 04/14/2017 0859   CALCIUM 8.7 (L) 06/15/2017 1547   GFRNONAA >60 06/15/2017 1547   GFRAA >60 06/15/2017 1547    INR No results found for: INR   Intake/Output Summary (Last 24 hours) at 06/16/2017 0759 Last data filed at 06/16/2017 0523 Gross per 24 hour  Intake 250 ml  Output 550 ml  Net -300 ml     Assessment:  84 y.o. female is here with progressive gangrene of right foot  Plan: bka tomorrow with Dr. Oneida Alar Npo past midnight. Will benefit from rehab on discharge   [place]. Donzetta Matters, MD [facility] Office: [phone] Pager: [phone]  06/16/2017 7:59 AM

## 2017-06-16 NOTE — Care Management Note (Signed)
Case Management Note  Patient Details  Name: SHARLETTE JANSMA MRN: [id number] Date of Birth: 1932/04/20  Subjective/Objective:                    Action/Plan:  MD note : Plan: bka tomorrow with Dr. Oneida Alar Npo past midnight. Will benefit from rehab on discharge  Will continue to follow for discharge needs. Await post op PT eval  Expected Discharge Date:  (unknown)               Expected Discharge Plan:     In-House Referral:     Discharge planning Services  CM Consult  Post Acute Care Choice:  Durable Medical Equipment, Home Health Choice offered to:     DME Arranged:    DME Agency:     HH Arranged:    HH Agency:     Status of Service:  In process, will continue to follow  If discussed at Long Length of Stay Meetings, dates discussed:    Additional Comments:  Marilu Favre, RN 06/16/2017, 11:45 AM

## 2017-06-16 NOTE — Social Work (Signed)
CSW acknowledging consult for SNF.  CSW awaiting post surgical PT/OT recommendations.  [place], [place] Work [phone]

## 2017-06-16 NOTE — H&P (View-Only) (Signed)
  Progress Note    06/16/2017 7:59 AM * No surgery found *  Subjective:  Pain is well controlled, wants to have amputation  Vitals:   06/15/17 2042 06/16/17 0521  BP: (!) 122/54 (!) 132/57  Pulse: 69 72  Resp: 18 18  Temp: 98.7 F (37.1 C) 97.6 F (36.4 C)  SpO2: 99% 100%    Physical Exam: aaox3 Non labored respirations Right leg with stable gangrene, cellulitis and edema improving  CBC    Component Value Date/Time   WBC 9.6 06/15/2017 1547   RBC 3.68 (L) 06/15/2017 1547   HGB 11.1 (L) 06/15/2017 1547   HCT 31.5 (L) 06/15/2017 1547   PLT 406 (H) 06/15/2017 1547   MCV 85.6 06/15/2017 1547   MCV 90.7 08/22/2014 1313   MCH 30.2 06/15/2017 1547   MCHC 35.2 06/15/2017 1547   RDW 13.5 06/15/2017 1547   LYMPHSABS 1.6 06/15/2017 1547   MONOABS 1.0 06/15/2017 1547   EOSABS 0.1 06/15/2017 1547   BASOSABS 0.0 06/15/2017 1547    BMET    Component Value Date/Time   NA 134 (L) 06/15/2017 1547   K 3.7 06/15/2017 1547   CL 99 (L) 06/15/2017 1547   CO2 27 06/15/2017 1547   GLUCOSE 100 (H) 06/15/2017 1547   BUN 8 06/15/2017 1547   CREATININE 0.44 06/15/2017 1547   CREATININE 0.44 (L) 04/14/2017 0859   CALCIUM 8.7 (L) 06/15/2017 1547   GFRNONAA >60 06/15/2017 1547   GFRAA >60 06/15/2017 1547    INR No results found for: INR   Intake/Output Summary (Last 24 hours) at 06/16/2017 0759 Last data filed at 06/16/2017 0523 Gross per 24 hour  Intake 250 ml  Output 550 ml  Net -300 ml     Assessment:  84 y.o. female is here with progressive gangrene of right foot  Plan: bka tomorrow with Dr. Oneida Alar Npo past midnight. Will benefit from rehab on discharge   [place]. Donzetta Matters, MD [facility] [place] Office: [phone] Pager: [phone]  06/16/2017 7:59 AM

## 2017-06-17 ENCOUNTER — Encounter (HOSPITAL_COMMUNITY): Payer: Self-pay | Admitting: Certified Registered Nurse Anesthetist

## 2017-06-17 ENCOUNTER — Inpatient Hospital Stay (HOSPITAL_COMMUNITY): Payer: Medicare Other | Admitting: Certified Registered Nurse Anesthetist

## 2017-06-17 ENCOUNTER — Encounter (HOSPITAL_COMMUNITY)
Admission: EM | Disposition: A | Discharge status: Skilled Nursing Facility | Payer: Self-pay | Source: Home / Self Care | Attending: Internal Medicine

## 2017-06-17 DIAGNOSIS — I739 Peripheral vascular disease, unspecified: Principal | ICD-10-CM

## 2017-06-17 DIAGNOSIS — L03119 Cellulitis of unspecified part of limb: Secondary | ICD-10-CM

## 2017-06-17 DIAGNOSIS — I998 Other disorder of circulatory system: Secondary | ICD-10-CM

## 2017-06-17 DIAGNOSIS — I96 Gangrene, not elsewhere classified: Secondary | ICD-10-CM

## 2017-06-17 HISTORY — PX: AMPUTATION: SHX166

## 2017-06-17 LAB — CREATININE, SERUM
Creatinine, Ser: 0.42 mg/dL — ABNORMAL LOW (ref 0.44–1.00)
GFR calc Af Amer: >60 mL/min (ref >60)
GFR calc non Af Amer: >60 mL/min (ref >60)

## 2017-06-17 LAB — CBC
HCT: 32.6 % — ABNORMAL LOW (ref 36.0–46.0)
Hemoglobin: 10.9 g/dL — ABNORMAL LOW (ref 12.0–15.0)
MCH: 29.5 pg (ref 26.0–34.0)
MCHC: 33.4 g/dL (ref 30.0–36.0)
MCV: 88.1 fL (ref 78.0–100.0)
Platelets: 383 10*3/uL (ref 150–400)
RBC: 3.7 MIL/uL — ABNORMAL LOW (ref 3.87–5.11)
RDW: 13.7 % (ref 11.5–15.5)
WBC: 8.7 10*3/uL (ref 4.0–10.5)

## 2017-06-17 SURGERY — AMPUTATION BELOW KNEE
Anesthesia: General | Site: Leg Lower | Laterality: Right

## 2017-06-17 MED ORDER — MEPERIDINE HCL 50 MG/ML IJ SOLN: 6.2500 mg | INTRAMUSCULAR | Status: DC | PRN

## 2017-06-17 MED ORDER — HYDROCODONE-ACETAMINOPHEN 5-325 MG PO TABS
1.0000 | ORAL_TABLET | Freq: Four times a day (QID) | ORAL | Status: DC | PRN
  Administered 2017-06-17: 1 via ORAL
  Filled 2017-06-17 (×2): qty 1

## 2017-06-17 MED ORDER — ONDANSETRON HCL 4 MG/2ML IJ SOLN
INTRAMUSCULAR | Status: DC | PRN
  Administered 2017-06-17: 4 mg via INTRAVENOUS

## 2017-06-17 MED ORDER — HYDROCODONE-ACETAMINOPHEN 5-325 MG PO TABS
2.0000 | ORAL_TABLET | Freq: Once | ORAL | Status: AC
  Administered 2017-06-17: 2 via ORAL
  Filled 2017-06-17: qty 2

## 2017-06-17 MED ORDER — ACETAMINOPHEN 650 MG RE SUPP: 325.0000 mg | RECTAL | Status: DC | PRN

## 2017-06-17 MED ORDER — CEFAZOLIN SODIUM-DEXTROSE 2-3 GM-%(50ML) IV SOLR
INTRAVENOUS | Status: DC | PRN
  Administered 2017-06-17: 2 g via INTRAVENOUS

## 2017-06-17 MED ORDER — PHENOL 1.4 % MT LIQD: 1.0000 | OROMUCOSAL | Status: DC | PRN

## 2017-06-17 MED ORDER — FENTANYL CITRATE (PF) 250 MCG/5ML IJ SOLN
INTRAMUSCULAR | Status: AC
  Filled 2017-06-17: qty 5

## 2017-06-17 MED ORDER — OXYCODONE-ACETAMINOPHEN 5-325 MG PO TABS: 1.0000 | ORAL_TABLET | ORAL | Status: DC | PRN

## 2017-06-17 MED ORDER — ONDANSETRON HCL 4 MG/2ML IJ SOLN
INTRAMUSCULAR | Status: AC
  Filled 2017-06-17: qty 2

## 2017-06-17 MED ORDER — HYDRALAZINE HCL 20 MG/ML IJ SOLN: 5.0000 mg | INTRAMUSCULAR | Status: DC | PRN

## 2017-06-17 MED ORDER — LACTATED RINGERS IV SOLN
INTRAVENOUS | Status: DC | PRN
  Administered 2017-06-17: 10:00:00 via INTRAVENOUS

## 2017-06-17 MED ORDER — MIDAZOLAM HCL 2 MG/2ML IJ SOLN
INTRAMUSCULAR | Status: AC
  Filled 2017-06-17: qty 2

## 2017-06-17 MED ORDER — MORPHINE SULFATE (PF) 4 MG/ML IV SOLN: 4.0000 mg | INTRAVENOUS | Status: DC | PRN

## 2017-06-17 MED ORDER — LIDOCAINE 2% (20 MG/ML) 5 ML SYRINGE
INTRAMUSCULAR | Status: AC
  Filled 2017-06-17: qty 5

## 2017-06-17 MED ORDER — METOPROLOL TARTRATE 5 MG/5ML IV SOLN: 2.0000 mg | INTRAVENOUS | Status: DC | PRN

## 2017-06-17 MED ORDER — ONDANSETRON HCL 4 MG/2ML IJ SOLN: 4.0000 mg | Freq: Once | INTRAMUSCULAR | Status: DC | PRN

## 2017-06-17 MED ORDER — 0.9 % SODIUM CHLORIDE (POUR BTL) OPTIME
TOPICAL | Status: DC | PRN
  Administered 2017-06-17: 1000 mL

## 2017-06-17 MED ORDER — FENTANYL CITRATE (PF) 250 MCG/5ML IJ SOLN
INTRAMUSCULAR | Status: DC | PRN
  Administered 2017-06-17: 50 ug via INTRAVENOUS
  Administered 2017-06-17 (×2): 25 ug via INTRAVENOUS

## 2017-06-17 MED ORDER — PHENYLEPHRINE 40 MCG/ML (10ML) SYRINGE FOR IV PUSH (FOR BLOOD PRESSURE SUPPORT)
PREFILLED_SYRINGE | INTRAVENOUS | Status: AC
  Filled 2017-06-17: qty 10

## 2017-06-17 MED ORDER — PHENYLEPHRINE HCL 10 MG/ML IJ SOLN
INTRAMUSCULAR | Status: DC | PRN
  Administered 2017-06-17 (×2): 80 ug via INTRAVENOUS

## 2017-06-17 MED ORDER — HYDROMORPHONE HCL 1 MG/ML IJ SOLN: 0.2500 mg | INTRAMUSCULAR | Status: DC | PRN

## 2017-06-17 MED ORDER — LABETALOL HCL 5 MG/ML IV SOLN: 10.0000 mg | INTRAVENOUS | Status: DC | PRN

## 2017-06-17 MED ORDER — EPHEDRINE SULFATE 50 MG/ML IJ SOLN
INTRAMUSCULAR | Status: DC | PRN
  Administered 2017-06-17: 5 mg via INTRAVENOUS

## 2017-06-17 MED ORDER — ACETAMINOPHEN 325 MG PO TABS: 325.0000 mg | ORAL_TABLET | ORAL | Status: DC | PRN

## 2017-06-17 MED ORDER — GUAIFENESIN-DM 100-10 MG/5ML PO SYRP: 15.0000 mL | ORAL_SOLUTION | ORAL | Status: DC | PRN

## 2017-06-17 MED ORDER — LIDOCAINE HCL (CARDIAC) 20 MG/ML IV SOLN
INTRAVENOUS | Status: DC | PRN
  Administered 2017-06-17: 100 mg via INTRAVENOUS

## 2017-06-17 MED ORDER — PHENYLEPHRINE HCL 10 MG/ML IJ SOLN
INTRAVENOUS | Status: DC | PRN
  Administered 2017-06-17: 25 ug/min via INTRAVENOUS

## 2017-06-17 MED ORDER — ENOXAPARIN SODIUM 40 MG/0.4ML ~~LOC~~ SOLN
40.0000 mg | SUBCUTANEOUS | Status: DC
  Administered 2017-06-18 – 2017-06-19 (×2): 40 mg via SUBCUTANEOUS
  Filled 2017-06-17 (×2): qty 0.4

## 2017-06-17 MED ORDER — PROPOFOL 10 MG/ML IV BOLUS
INTRAVENOUS | Status: AC
  Filled 2017-06-17: qty 20

## 2017-06-17 MED ORDER — PANTOPRAZOLE SODIUM 40 MG PO TBEC
40.0000 mg | DELAYED_RELEASE_TABLET | Freq: Every day | ORAL | Status: DC
  Administered 2017-06-17 – 2017-06-19 (×3): 40 mg via ORAL
  Filled 2017-06-17 (×3): qty 1

## 2017-06-17 MED ORDER — LACTATED RINGERS IV SOLN
INTRAVENOUS | Status: DC
  Administered 2017-06-17: 10:00:00 via INTRAVENOUS

## 2017-06-17 MED ORDER — PROPOFOL 10 MG/ML IV BOLUS
INTRAVENOUS | Status: DC | PRN
  Administered 2017-06-17: 120 mg via INTRAVENOUS

## 2017-06-17 SURGICAL SUPPLY — 49 items
BANDAGE ACE 6X5 VEL STRL LF (GAUZE/BANDAGES/DRESSINGS) ×2 IMPLANT
BANDAGE ESMARK 6X9 LF (GAUZE/BANDAGES/DRESSINGS) IMPLANT
BLADE SAW RECIP 87.9 MT (BLADE) ×2 IMPLANT
BNDG CMPR 9X6 STRL LF SNTH (GAUZE/BANDAGES/DRESSINGS)
BNDG COHESIVE 6X5 TAN STRL LF (GAUZE/BANDAGES/DRESSINGS) ×2 IMPLANT
BNDG ESMARK 6X9 LF (GAUZE/BANDAGES/DRESSINGS)
BNDG GAUZE ELAST 4 BULKY (GAUZE/BANDAGES/DRESSINGS) ×3 IMPLANT
CANISTER SUCT 3000ML PPV (MISCELLANEOUS) ×2 IMPLANT
CLIP VESOCCLUDE MED 6/CT (CLIP) IMPLANT
COVER BACK TABLE 60X90IN (DRAPES) ×2 IMPLANT
COVER SURGICAL LIGHT HANDLE (MISCELLANEOUS) ×2 IMPLANT
CUFF TOURNIQUET SINGLE 18IN (TOURNIQUET CUFF) IMPLANT
CUFF TOURNIQUET SINGLE 24IN (TOURNIQUET CUFF) IMPLANT
CUFF TOURNIQUET SINGLE 34IN LL (TOURNIQUET CUFF) IMPLANT
CUFF TOURNIQUET SINGLE 44IN (TOURNIQUET CUFF) IMPLANT
DRAIN CHANNEL 19F RND (DRAIN) IMPLANT
DRAPE HALF SHEET 40X57 (DRAPES) ×4 IMPLANT
DRAPE ORTHO SPLIT 77X108 STRL (DRAPES) ×4
DRAPE SURG ORHT 6 SPLT 77X108 (DRAPES) ×2 IMPLANT
DRSG ADAPTIC 3X8 NADH LF (GAUZE/BANDAGES/DRESSINGS) ×2 IMPLANT
ELECT REM PT RETURN 9FT ADLT (ELECTROSURGICAL) ×2
ELECTRODE REM PT RTRN 9FT ADLT (ELECTROSURGICAL) ×1 IMPLANT
EVACUATOR SILICONE 100CC (DRAIN) IMPLANT
GAUZE SPONGE 4X4 12PLY STRL (GAUZE/BANDAGES/DRESSINGS) ×4 IMPLANT
GAUZE SPONGE 4X4 12PLY STRL LF (GAUZE/BANDAGES/DRESSINGS) ×1 IMPLANT
GLOVE BIO SURGEON STRL SZ7 (GLOVE) ×1 IMPLANT
GLOVE BIO SURGEON STRL SZ7.5 (GLOVE) ×2 IMPLANT
GLOVE BIOGEL PI IND STRL 6.5 (GLOVE) IMPLANT
GLOVE BIOGEL PI INDICATOR 6.5 (GLOVE) ×1
GLOVE ECLIPSE 7.5 STRL STRAW (GLOVE) ×1 IMPLANT
GOWN STRL REUS W/ TWL LRG LVL3 (GOWN DISPOSABLE) ×3 IMPLANT
GOWN STRL REUS W/TWL LRG LVL3 (GOWN DISPOSABLE) ×6
KIT BASIN OR (CUSTOM PROCEDURE TRAY) ×2 IMPLANT
KIT ROOM TURNOVER OR (KITS) ×2 IMPLANT
NS IRRIG 1000ML POUR BTL (IV SOLUTION) ×2 IMPLANT
PACK GENERAL/GYN (CUSTOM PROCEDURE TRAY) ×2 IMPLANT
PAD ARMBOARD 7.5X6 YLW CONV (MISCELLANEOUS) ×4 IMPLANT
STAPLER VISISTAT 35W (STAPLE) ×2 IMPLANT
STOCKINETTE IMPERVIOUS LG (DRAPES) ×2 IMPLANT
SUT ETHILON 3 0 PS 1 (SUTURE) IMPLANT
SUT SILK 2 0 (SUTURE) ×2
SUT SILK 2 0 SH CR/8 (SUTURE) ×2 IMPLANT
SUT SILK 2-0 18XBRD TIE 12 (SUTURE) ×1 IMPLANT
SUT VIC AB 2-0 SH 18 (SUTURE) ×5 IMPLANT
SUT VIC AB 3-0 SH 27 (SUTURE) ×4
SUT VIC AB 3-0 SH 27X BRD (SUTURE) ×1 IMPLANT
TOWEL GREEN STERILE (TOWEL DISPOSABLE) ×4 IMPLANT
UNDERPAD 30X30 (UNDERPADS AND DIAPERS) ×2 IMPLANT
WATER STERILE IRR 1000ML POUR (IV SOLUTION) ×2 IMPLANT

## 2017-06-17 NOTE — Anesthesia Procedure Notes (Signed)
Procedure Name: LMA Insertion Date/Time: 06/17/2017 9:43 AM Performed by: Glynda Jaeger, CRNA Pre-anesthesia Checklist: Patient identified, Emergency Drugs available, Suction available, Timeout performed and Patient being monitored Patient Re-evaluated:Patient Re-evaluated prior to induction Oxygen Delivery Method: Circle system utilized Preoxygenation: Pre-oxygenation with 100% oxygen Induction Type: IV induction Ventilation: Mask ventilation without difficulty LMA: LMA inserted LMA Size: 4.0 Number of attempts: 1 Tube secured with: Tape Dental Injury: Teeth and Oropharynx as per pre-operative assessment

## 2017-06-17 NOTE — Op Note (Signed)
Procedure: Right below-knee amputation  Preoperative diagnosis: Gangrene right foot  Postoperative diagnosis: Same  Anesthesia Gen.  Assistant: Arlee Muslim PA-C  Upper findings: #1 reasonably well perfused muscle tissue  Operative details: After obtaining informed consent, the patient was taken to the operating room. The patient was placed in supine position on the operating table. After induction of general anesthesia and endotracheal intubation, the patient's entire right lower extremity was prepped and draped all the way down to the level of the ankle. Next a transverse incision was made approximately 4 fingerbreadths below the tibial tuberosity on the right leg.  The  incision was carried posteriorly to the midportion of the leg and then extended longitudinally to create a posterior flap. The subcutaneous tissues and fascia was taken down with cautery. Periosteum was raised on the tibia approximately 5 cm above the skin edge.  The periosteum was also raised on the fibula several centimeters above this. The tibia was then divided with a saw. The fibula was divided with a bone cutter. The leg was then elevated in the operative field and the amputation was completed posterior to the bones with an amputation knife. Hemostasis was then obtained with cautery and several suture ligatures. The tibial and sural nerves were pulled down into the field transected and allowed to retract up into the leg.  After hemostasis was obtained, the wound was thoroughly irrigated with  normal saline solution. The fascial edges were then reapproximated with interrupted 2-0 Vicryl sutures. Subcutaneous tissues were reapproximated using running 3-0 Vicryl suture. The skin was closed with staples. The patient tolerated the procedure well and there were no complications. Instrument sponge and  needle counts were correct at the end of the case. The patient was taken to the recovery room in stable condition.  [name],  MD [facility] Office: [phone] Pager: [phone]

## 2017-06-17 NOTE — Addendum Note (Signed)
Addendum  created 06/17/17 1224 by Lillia Abed, MD   Order list changed, Order sets accessed

## 2017-06-17 NOTE — Progress Notes (Signed)
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  She presents with gangrene of the right foot.  The foot has been black and red for at least 2 weeks now.  In the ER she was noted to have necrotic toes and gangrenous changes of her foot with cellulitis extending above the ankle.  Subjective: She has no complaints. ROS: no complaints of nausea, vomiting, constipation diarrhea, cough, dyspnea or dysuria. No other complaints.   Assessment & Plan:   Principal Problem:   Cellulitis & Ischemic foot-right side  Has prior left BKA   PAD (peripheral artery disease)  -She will undergo below the knee amputation today per vascular surgery -Continue daily Plavix -Also on vancomycin and cefepime -Blood cultures x2 negative  Active Problems: Hypertension -On lisinopril and hydrochlorothiazide -Metoprolol IV and hydralazine IV ordered as needed   DVT prophylaxis: Lovenox Code Status: Full code Family Communication:  Disposition Plan:  Consultants:   Vascular surgery Procedures:   Right below the knee amputation Antimicrobials:  Anti-infectives (From admission, onward)   Start     Dose/Rate Route Frequency Ordered Stop   06/16/17 1800  vancomycin (VANCOCIN) IVPB 750 mg/150 ml premix     750 mg 150 mL/hr over 60 Minutes Intravenous Every 24 hours 06/15/17 1721     06/16/17 0600  ceFEPIme (MAXIPIME) 2 g in dextrose 5 % 50 mL IVPB     2 g 100 mL/hr over 30 Minutes Intravenous Every 12 hours 06/15/17 1718     06/15/17 1730  vancomycin (VANCOCIN) 1,250 mg in sodium chloride 0.9 % 250 mL IVPB     1,250 mg 166.7 mL/hr over 90 Minutes Intravenous STAT 06/15/17 1716 06/15/17 2034   06/15/17 1730  metroNIDAZOLE (FLAGYL) IVPB 500 mg  Status:   Discontinued     500 mg 100 mL/hr over 60 Minutes Intravenous Every 8 hours 06/15/17 1719 06/17/17 0359   06/15/17 1715  ceFEPIme (MAXIPIME) 2 g in dextrose 5 % 50 mL IVPB     2 g 100 mL/hr over 30 Minutes Intravenous STAT 06/15/17 1707 06/15/17 2134   06/15/17 1700  metroNIDAZOLE (FLAGYL) IVPB 500 mg  Status:  Discontinued     500 mg 100 mL/hr over 60 Minutes Intravenous Every 8 hours 06/15/17 1656 06/15/17 1719   06/15/17 1415  ceFAZolin (ANCEF) IVPB 1 g/50 mL premix     1 g 100 mL/hr over 30 Minutes Intravenous  Once 06/15/17 1400 06/15/17 1550       Objective: Vitals:   06/17/17 1210 06/17/17 1225 06/17/17 1238 06/17/17 1420  BP: 111/64 101/72  122/75  Pulse: 78 66  (!) 58  Resp: 13 12    Temp:   97.7 F (36.5 C) 97.8 F (36.6 C)  TempSrc:    Oral  SpO2: 97% 95%  98%  Weight:      Height:        Intake/Output Summary (Last 24 hours) at 06/17/2017 1614 Last data filed at 06/17/2017 1140 Gross per 24 hour  Intake 1022 ml  Output 375 ml  Net 647 ml   Filed Weights   06/15/17 1708 06/17/17 1001  Weight: 59 kg (130 lb) 59 kg (130 lb 0 oz)    Examination: General exam: Appears comfortable  HEENT: PERRLA, oral mucosa  moist, no sclera icterus or thrush Respiratory system: Clear to auscultation. Respiratory effort normal. Cardiovascular system: S1 & S2 heard, RRR.  No murmurs  Gastrointestinal system: Abdomen soft, non-tender, nondistended. Normal bowel sound. No organomegaly Central nervous system: Alert and oriented. No focal neurological deficits. Extremities: Gangrene of  right foot noted along with cellulitis, she has a left BKA Skin: No rashes or ulcers Psychiatry:  Mood & affect appropriate.     Data Reviewed: I have personally reviewed following labs and imaging studies  CBC: Recent Labs  Lab 06/15/17 1239 06/15/17 1547 06/17/17 0552  WBC 12.9* 9.6 8.7  NEUTROABS 10.4* 6.9  --   HGB 10.7* 11.1* 10.9*  HCT 31.1* 31.5* 32.6*  MCV 85.9 85.6 88.1  PLT  418* 406* 564   Basic Metabolic Panel: Recent Labs  Lab 06/15/17 1239 06/15/17 1547 06/17/17 0552  NA 130* 134*  --   K 3.6 3.7  --   CL 97* 99*  --   CO2 25 27  --   GLUCOSE 151* 100*  --   BUN 11 8  --   CREATININE 0.52 0.44 0.42*  CALCIUM 8.5* 8.7*  --    GFR: Estimated Creatinine Clearance: 49.6 mL/min (A) (by C-G formula based on SCr of 0.42 mg/dL (L)). Liver Function Tests: Recent Labs  Lab 06/15/17 1239  AST 20  ALT 14  ALKPHOS 62  BILITOT 0.4  PROT 6.3*  ALBUMIN 2.9*   No results for input(s): LIPASE, AMYLASE in the last 168 hours. No results for input(s): AMMONIA in the last 168 hours. Coagulation Profile: No results for input(s): INR, PROTIME in the last 168 hours. Cardiac Enzymes: No results for input(s): CKTOTAL, CKMB, CKMBINDEX, TROPONINI in the last 168 hours. BNP (last 3 results) No results for input(s): PROBNP in the last 8760 hours. HbA1C: No results for input(s): HGBA1C in the last 72 hours. CBG: No results for input(s): GLUCAP in the last 168 hours. Lipid Profile: No results for input(s): CHOL, HDL, LDLCALC, TRIG, CHOLHDL, LDLDIRECT in the last 72 hours. Thyroid Function Tests: No results for input(s): TSH, T4TOTAL, FREET4, T3FREE, THYROIDAB in the last 72 hours. Anemia Panel: No results for input(s): VITAMINB12, FOLATE, FERRITIN, TIBC, IRON, RETICCTPCT in the last 72 hours. Urine analysis: No results found for: COLORURINE, APPEARANCEUR, LABSPEC, PHURINE, GLUCOSEU, HGBUR, BILIRUBINUR, KETONESUR, PROTEINUR, UROBILINOGEN, NITRITE, LEUKOCYTESUR Sepsis Labs: @LABRCNTIP (procalcitonin:4,lacticidven:4) ) Recent Results (from the past 240 hour(s))  Surgical PCR screen     Status: None   Collection Time: 06/15/17  8:50 PM  Result Value Ref Range Status   MRSA, PCR NEGATIVE NEGATIVE Final   Staphylococcus aureus NEGATIVE NEGATIVE Final    Comment: (NOTE) The Xpert SA Assay (FDA approved for NASAL specimens in patients 37 years of age and older),  is one component of a comprehensive surveillance program. It is not intended to diagnose infection nor to guide or monitor treatment. Performed at [facility], [place] [address], [place], [place] [address]          Radiology Studies: No results found.    Scheduled Meds: . clopidogrel  75 mg Oral Daily  . enoxaparin (LOVENOX) injection  40 mg Subcutaneous Q24H  . feeding supplement (PRO-STAT SUGAR FREE 64)  30 mL Oral BID  . lisinopril  10 mg Oral Daily   And  . hydrochlorothiazide  12.5 mg Oral Daily  . multivitamin with minerals  1 tablet Oral Daily  . pantoprazole  40 mg Oral Daily   Continuous Infusions: .  ceFEPime (MAXIPIME) IV Stopped (06/17/17 0630)  . lactated ringers 10 mL/hr at 06/17/17 1002  . vancomycin Stopped (06/16/17 1915)     LOS: 2 days    Time spent in minutes: 35    Debbe Odea, MD [facility] Pager: [email or web address] Password [email or web address] 06/17/2017, 4:14 PM

## 2017-06-17 NOTE — Interval H&P Note (Signed)
History and Physical Interval Note:  06/17/2017 10:04 AM  [name]  has presented today for surgery, with the diagnosis of nonviable tissue  The various methods of treatment have been discussed with the patient and family. After consideration of risks, benefits and other options for treatment, the patient has consented to  Procedure(s): AMPUTATION BELOW KNEE (Right) as a surgical intervention .  The patient's history has been reviewed, patient examined, no change in status, stable for surgery.  I have reviewed the patient's chart and labs.  Questions were answered to the patient's satisfaction.     [name]

## 2017-06-17 NOTE — Transfer of Care (Signed)
Immediate Anesthesia Transfer of Care Note  Patient: [name]  Procedure(s) Performed: Right  BELOW KNEE Amputation (Right Leg Lower)  Patient Location: PACU  Anesthesia Type:General  Level of Consciousness: drowsy and responds to stimulation  Airway & Oxygen Therapy: Patient Spontanous Breathing and Patient connected to face mask oxygen  Post-op Assessment: Report given to RN and Post -op Vital signs reviewed and stable  Post vital signs: Reviewed and stable  Last Vitals:  Vitals:   06/17/17 0527 06/17/17 1156  BP: 140/76   Pulse: 74   Resp: 17   Temp: 36.4 C (!) (P) 36.2 C  SpO2: 100%     Last Pain:  Vitals:   06/17/17 0657  TempSrc:   PainSc: 4          Complications: No apparent anesthesia complications

## 2017-06-17 NOTE — Anesthesia Postprocedure Evaluation (Signed)
Anesthesia Post Note  Patient: [name]  Procedure(s) Performed: Right  BELOW KNEE Amputation (Right Leg Lower)     Patient location during evaluation: PACU Anesthesia Type: General Level of consciousness: awake and alert Pain management: pain level controlled Vital Signs Assessment: post-procedure vital signs reviewed and stable Respiratory status: spontaneous breathing, nonlabored ventilation, respiratory function stable and patient connected to nasal cannula oxygen Cardiovascular status: blood pressure returned to baseline and stable Postop Assessment: no apparent nausea or vomiting Anesthetic complications: no    Last Vitals:  Vitals:   06/17/17 1156 06/17/17 1158  BP:  (!) 90/58  Pulse:  (!) 55  Resp:  13  Temp: (!) 36.2 C   SpO2:  100%    Last Pain:  Vitals:   06/17/17 1156  TempSrc:   PainSc: Asleep                 Joushua Dugar DAVID

## 2017-06-17 NOTE — Anesthesia Preprocedure Evaluation (Signed)
Anesthesia Evaluation  Patient identified by MRN, date of birth, ID band Patient awake    Reviewed: Allergy & Precautions, NPO status , Patient's Chart, lab work & pertinent test results  Airway Mallampati: I  TM Distance: >3 FB Neck ROM: Full    Dental   Pulmonary    Pulmonary exam normal        Cardiovascular hypertension, Pt. on medications Normal cardiovascular exam     Neuro/Psych    GI/Hepatic   Endo/Other    Renal/GU      Musculoskeletal   Abdominal   Peds  Hematology   Anesthesia Other Findings   Reproductive/Obstetrics                             Anesthesia Physical Anesthesia Plan  ASA: III  Anesthesia Plan: General   Post-op Pain Management:    Induction: Intravenous  PONV Risk Score and Plan: 2 and Ondansetron and Treatment may vary due to age or medical condition  Airway Management Planned: LMA  Additional Equipment:   Intra-op Plan:   Post-operative Plan: Extubation in OR  Informed Consent: I have reviewed the patients History and Physical, chart, labs and discussed the procedure including the risks, benefits and alternatives for the proposed anesthesia with the patient or authorized representative who has indicated his/her understanding and acceptance.     Plan Discussed with: CRNA and Surgeon  Anesthesia Plan Comments:         Anesthesia Quick Evaluation

## 2017-06-18 ENCOUNTER — Encounter (HOSPITAL_COMMUNITY): Payer: Self-pay | Admitting: Vascular Surgery

## 2017-06-18 DIAGNOSIS — E440 Moderate protein-calorie malnutrition: Secondary | ICD-10-CM

## 2017-06-18 DIAGNOSIS — Z89511 Acquired absence of right leg below knee: Secondary | ICD-10-CM

## 2017-06-18 LAB — BASIC METABOLIC PANEL
ANION GAP: 10 (ref 5–15)
BUN: 5 mg/dL — ABNORMAL LOW (ref 6–20)
CALCIUM: 8 mg/dL — AB (ref 8.9–10.3)
CO2: 24 mmol/L (ref 22–32)
Chloride: 96 mmol/L — ABNORMAL LOW (ref 101–111)
Creatinine, Ser: 0.39 mg/dL — ABNORMAL LOW (ref 0.44–1.00)
GFR calc Af Amer: >60 mL/min (ref >60)
GLUCOSE: 125 mg/dL — AB (ref 65–99)
Potassium: 3.5 mmol/L (ref 3.5–5.1)
SODIUM: 130 mmol/L — AB (ref 135–145)

## 2017-06-18 LAB — CBC
HCT: 30 % — ABNORMAL LOW (ref 36.0–46.0)
HEMOGLOBIN: 9.9 g/dL — AB (ref 12.0–15.0)
MCH: 28.9 pg (ref 26.0–34.0)
MCHC: 33 g/dL (ref 30.0–36.0)
MCV: 87.7 fL (ref 78.0–100.0)
Platelets: 385 10*3/uL (ref 150–400)
RBC: 3.42 MIL/uL — ABNORMAL LOW (ref 3.87–5.11)
RDW: 13.6 % (ref 11.5–15.5)
WBC: 9.3 10*3/uL (ref 4.0–10.5)

## 2017-06-18 NOTE — Consult Note (Signed)
Physical Medicine and Rehabilitation Consult   Reason for Consult: Gangrene right foot Referring Physician: Dr. Bridgett Larsson   HPI: [name] is a 84 y.o. female with history of HTN, PAD s/p L-BKA who was admitted on 06/15/17 with 2 week history of pain with right foot cellulitis and gangrenous changes. She was placed on broad spectrum antibiotics and underwent R-BKA on 02/06 by Dr. Bridgett Larsson. PT/OT evaluations pending. CIR recommended in anticipation of extensive rehab needs.    She was independent with left prosthesis and walker. Able to walk household distances and does light housework. [name] (unemployed) lives on the property and provides assistance as needed.    Review of Systems  Constitutional: Negative for chills and fever.  HENT: Negative for hearing loss and tinnitus.   Eyes: Negative for blurred vision and double vision.  Respiratory: Negative for cough and shortness of breath.   Cardiovascular: Negative for chest pain and palpitations.  Gastrointestinal: Negative for abdominal pain, heartburn and nausea.  Genitourinary: Positive for urgency. Negative for dysuria.  Musculoskeletal: Positive for back pain (occasionally) and myalgias.  Skin: Negative for rash.  Neurological: Negative for dizziness and headaches.  Psychiatric/Behavioral: Negative for depression and memory loss. The patient is not nervous/anxious.      Past Medical History:  Diagnosis Date  . Arthritis   . Cancer ([place])    utertrine  . Hypertension   . Osteoporosis 04/15/2017  . PAD (peripheral artery disease) ([place])     Past Surgical History:  Procedure Laterality Date  . ABDOMINAL HYSTERECTOMY     partial  . AMPUTATION Left 01/29/2015   Procedure: Left  AMPUTATION BELOW KNEE;  Surgeon: Elam Dutch, MD;  Location: [facility];  Service: Vascular;  Laterality: Left;  . AMPUTATION Right 06/17/2017   Procedure: Right  BELOW KNEE Amputation;  Surgeon: Elam Dutch, MD;  Location: [facility];  Service:  Vascular;  Laterality: Right;  . KNEE SURGERY Right 1984  . PERIPHERAL VASCULAR CATHETERIZATION N/A 09/15/2014   Procedure: Abdominal Aortogram;  Surgeon: Elam Dutch, MD;  Location: [facility] INVASIVE CV LAB [facility];  Service: Cardiovascular;  Laterality: N/A;  . PERIPHERAL VASCULAR CATHETERIZATION Bilateral 09/15/2014   Procedure: Lower Extremity Angiography;  Surgeon: Elam Dutch, MD;  Location: [facility] INVASIVE CV LAB [facility]UPID;  Service: Cardiovascular;  Laterality: Bilateral;  . SPINE SURGERY      Family History  Problem Relation Age of Onset  . Diabetes Mother   . Heart disease Mother   . Dementia Sister   . Diabetes Son     Social History:  Lives alone in a house with ramp at entrance. Her grandson (his son)  lives on property and help out as needed. She reports that  has never smoked. she has never used smokeless tobacco. She reports that she does not drink alcohol or use drugs.    Allergies: No Known Allergies    Medications Prior to Admission  Medication Sig Dispense Refill  . clopidogrel (PLAVIX) 75 MG tablet Take 1 tablet (75 mg total) by mouth daily. 90 tablet 3  . ibuprofen (ADVIL,MOTRIN) 200 MG tablet Take 600 mg by mouth every 6 (six) hours as needed (For pain.).    Marland Kitchen lisinopril-hydrochlorothiazide (PRINZIDE,ZESTORETIC) 10-12.5 MG tablet Take 1 tablet by mouth daily. 30 tablet 6  . omeprazole (PRILOSEC) 20 MG capsule Take 20 mg by mouth daily.    Vladimir Faster Glycol-Propyl Glycol (SYSTANE FREE OP) Place 1 drop into both eyes daily. For dry eyes  Home: Home Living Family/patient expects to be discharged to:: Unsure Living Arrangements: Alone  Functional History:   Functional Status:  Mobility:          ADL:    Cognition: Cognition Orientation Level: Oriented to person, Oriented to place, Oriented to situation    Blood pressure 119/63, pulse 69, temperature 99.1 F (37.3 C), temperature source Oral, resp. rate 18, height 5\' 7"  (1.702 m), weight 59 kg (130  lb 0 oz), SpO2 96 %. Physical Exam  Nursing note and vitals reviewed. Constitutional: She is oriented to person, place, and time. She appears well-developed and well-nourished. No distress.  Sitting upright in bed and able to reposition herself when seated without difficulty.   HENT:  Head: Normocephalic and atraumatic.  Mouth/Throat: Oropharynx is clear and moist.  Eyes: Conjunctivae and EOM are normal. Pupils are equal, round, and reactive to light.  Neck: Normal range of motion. Neck supple.  Cardiovascular: Normal rate and regular rhythm. Exam reveals no friction rub.  Respiratory: Effort normal and breath sounds normal. No stridor. No respiratory distress. She has no wheezes.  GI: Soft. Bowel sounds are normal. She exhibits no distension. There is no tenderness.  Musculoskeletal: She exhibits no edema or tenderness.  Left BKA site well healed and intact. Scar tissue and adherence of skin to distal tibia. Can extend fully.  R-BKA with compressive dressing.   Neurological: She is alert and oriented to person, place, and time.  UE 5/5. LLE 4+ to 5/5. RLE 3/5  Skin: Skin is warm and dry. She is not diaphoretic.  Psychiatric: She has a normal mood and affect. Her behavior is normal. Judgment and thought content normal.    Results for orders placed or performed during the hospital encounter of 06/15/17 (from the past 24 hour(s))  Basic metabolic panel     Status: Abnormal   Collection Time: 06/18/17  6:03 AM  Result Value Ref Range   Sodium 130 (L) 135 - 145 mmol/L   Potassium 3.5 3.5 - 5.1 mmol/L   Chloride 96 (L) 101 - 111 mmol/L   CO2 24 22 - 32 mmol/L   Glucose, Bld 125 (H) 65 - 99 mg/dL   BUN 5 (L) 6 - 20 mg/dL   Creatinine, Ser 0.39 (L) 0.44 - 1.00 mg/dL   Calcium 8.0 (L) 8.9 - 10.3 mg/dL   GFR calc non Af Amer >60 >60 mL/min   GFR calc Af Amer >60 >60 mL/min   Anion gap 10 5 - 15  CBC     Status: Abnormal   Collection Time: 06/18/17  6:03 AM  Result Value Ref Range   WBC  9.3 4.0 - 10.5 K/uL   RBC 3.42 (L) 3.87 - 5.11 MIL/uL   Hemoglobin 9.9 (L) 12.0 - 15.0 g/dL   HCT 30.0 (L) 36.0 - 46.0 %   MCV 87.7 78.0 - 100.0 fL   MCH 28.9 26.0 - 34.0 pg   MCHC 33.0 30.0 - 36.0 g/dL   RDW 13.6 11.5 - 15.5 %   Platelets 385 150 - 400 K/uL   No results found.  Assessment/Plan: Diagnosis: POD #1 Right BKA, hx of left BKA 2016 1. Does the need for close, 24 hr/day medical supervision in concert with the patient's rehab needs make it unreasonable for this patient to be served in a less intensive setting? Yes 2. Co-Morbidities requiring supervision/potential complications: pain control, wound care, HTN 3. Due to bladder management, bowel management, safety, skin/wound care, disease management, medication administration, pain  management and patient education, does the patient require 24 hr/day rehab nursing? Yes 4. Does the patient require coordinated care of a physician, rehab nurse, PT (1-2 hrs/day, 5 days/week) and OT (1-2 hrs/day, 5 days/week) to address physical and functional deficits in the context of the above medical diagnosis(es)? Yes Addressing deficits in the following areas: balance, endurance, locomotion, strength, transferring, bowel/bladder control, bathing, dressing, feeding, grooming, toileting and psychosocial support 5. Can the patient actively participate in an intensive therapy program of at least 3 hrs of therapy per day at least 5 days per week? Yes 6. The potential for patient to make measurable gains while on inpatient rehab is excellent 7. Anticipated functional outcomes upon discharge from inpatient rehab are modified independent  with PT, modified independent with OT, n/a with SLP. 8. Estimated rehab length of stay to reach the above functional goals is: potentially 7-13 days  9. Anticipated D/C setting: Home 10. Anticipated post D/C treatments: HH therapy and Outpatient therapy 11. Overall Rehab/Functional Prognosis:  excellent  RECOMMENDATIONS: This patient's condition is appropriate for continued rehabilitative care in the following setting: CIR Patient has agreed to participate in recommended program. Yes Note that insurance prior authorization may be required for reimbursement for recommended care.  Comment: Pt to bring in left BK prosthesis which may need some adjustment. Will follow along as therapy mobilizes patient.   Meredith Staggers, MD, [place] 06/18/2017    Bary Leriche, PA-C 06/18/2017

## 2017-06-18 NOTE — Evaluation (Signed)
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 Currently pt min assist +2 for AP transfer and mod assist for LB ADL in sitting. Recommending SNF for follow up to maximize independence and safety with ADL and functional mobility prior to return home. Pt would benefit from continued skilled OT to address established goals.    Follow Up Recommendations  SNF    Equipment Recommendations  None recommended by OT    Recommendations for Other Services       Precautions / Restrictions Precautions Precautions: Fall      Mobility Bed Mobility Overal bed mobility: Needs Assistance Bed Mobility: Supine to Sit     Supine to sit: Min guard     General bed mobility comments: partial roll and use of rail, incr time needed, min/guard to bring trunk to upright  Transfers Overall transfer level: Needs assistance   Transfers: Comptroller transfers: Min assist;+2 physical assistance   General transfer comment: assist to complete posterior scooting from bed to chair, mostly d/t UE fatigue;  cues for  transfer technique    Balance Overall balance assessment: Needs assistance   Sitting balance-Leahy Scale: Good         Standing balance comment: NT                           ADL either performed or assessed with clinical judgement   ADL Overall ADL's : Needs assistance/impaired Eating/Feeding: Set up;Sitting   Grooming: Set up;Supervision/safety;Sitting   Upper Body Bathing: Set up;Supervision/ safety;Sitting   Lower Body Bathing: Moderate assistance;Sitting/lateral leans    Upper Body Dressing : Set up;Supervision/safety;Sitting   Lower Body Dressing: Moderate assistance;Sitting/lateral leans               Functional mobility during ADLs: Minimal assistance;+2 for physical assistance(for AP transfer)       Vision         Perception     Praxis      Pertinent Vitals/Pain Pain Assessment: Faces Faces Pain Scale: Hurts a little bit Pain Location: right residual limb Pain Descriptors / Indicators: Sore Pain Intervention(s): Limited activity within patient's tolerance;Monitored during session;Repositioned     Hand Dominance     Extremity/Trunk Assessment Upper Extremity Assessment Upper Extremity Assessment: Overall WFL for tasks assessed   Lower Extremity Assessment Lower Extremity Assessment: Defer to PT evaluation    Cervical / Trunk Assessment Cervical / Trunk Assessment: Normal   Communication Communication Communication: No difficulties   Cognition Arousal/Alertness: Awake/alert Behavior During Therapy: WFL for tasks assessed/performed Overall Cognitive Status: Within Functional Limits for tasks assessed Area of Impairment: Following commands                       Following Commands: Follows one step commands consistently;Follows one step commands with increased time       General Comments: incr time to find words/finish thoughts at times   General Comments       Exercises     Shoulder Instructions      Home Living Family/patient expects  to be discharged to:: Skilled nursing facility Living Arrangements: Alone Available Help at Discharge: Family Type of Home: House Home Access: [place]: Two level;Able to live on main level with bedroom/bathroom               Home Equipment: Gilford Rile - 2 wheels;Bedside commode;Wheelchair - manual;Transport chair;Hospital bed          Prior Functioning/Environment Level of Independence: Needs assistance  Gait / Transfers Assistance  Needed: amb with RW and LLE prosthesis prior to admission ADL's / Homemaking Assistance Needed: sponge bathes and dresses independently. Son assists with IADL   Comments: son helps with errands, grocery shopping,etc        OT Problem List: Decreased strength;Decreased activity tolerance;Impaired balance (sitting and/or standing);Decreased knowledge of use of DME or AE;Decreased knowledge of precautions;Pain;Increased edema      OT Treatment/Interventions: Self-care/ADL training;Energy conservation;DME and/or AE instruction;Therapeutic exercise;Therapeutic activities;Patient/family education;Balance training    OT Goals(Current goals can be found in the care plan section) Acute Rehab OT Goals Patient Stated Goal: to go to rehab at [facility] OT Goal Formulation: With patient Time For Goal Achievement: 07/02/17 Potential to Achieve Goals: Good ADL Goals Pt Will Perform Lower Body Bathing: with supervision;sitting/lateral leans Pt Will Perform Lower Body Dressing: with supervision;sitting/lateral leans Pt Will Transfer to Toilet: with supervision;with transfer board;bedside commode Pt Will Perform Toileting - Clothing Manipulation and hygiene: with supervision;sitting/lateral leans  OT Frequency: Min 2X/week   Barriers to D/C:            Co-evaluation PT/OT/SLP Co-Evaluation/Treatment: Yes Reason for Co-Treatment: For patient/therapist safety PT goals addressed during session: Mobility/safety with mobility OT goals addressed during session: Strengthening/ROM      AM-PAC PT "6 Clicks" Daily Activity     Outcome Measure Help from another person eating meals?: A Little Help from another person taking care of personal grooming?: A Little Help from another person toileting, which includes using toliet, bedpan, or urinal?: A Lot Help from another person bathing (including washing, rinsing, drying)?: A Lot Help from another person to put on and taking off regular upper body  clothing?: A Little Help from another person to put on and taking off regular lower body clothing?: A Lot 6 Click Score: 15   End of Session    Activity Tolerance: Patient tolerated treatment well Patient left: in chair;with call bell/phone within reach  OT Visit Diagnosis: Other abnormalities of gait and mobility (R26.89);Pain Pain - Right/Left: Right Pain - part of body: Leg                Time: 1352-1416 OT Time Calculation (min): 24 min Charges:  OT General Charges $OT Visit: 1 Visit OT Evaluation $OT Eval Moderate Complexity: 1 Mod G-Codes:     Samari Bittinger A. Ulice Brilliant, M.S., OTR/L Pager: [place] 06/18/2017, 4:50 PM

## 2017-06-18 NOTE — Evaluation (Signed)
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 Pt currently with functional limitations due to the deficits listed below (see PT Problem List). *Pt is independent at her baseline, household ambulator  With  LLE prosthesis and RW; pt is limited in transfers/gait/activity tolerance and will benefit from SNF post acute;  Pt will benefit from skilled PT to increase their independence and safety with mobility to allow discharge to the venue listed below.  Will continue to follow in acute setting   Follow Up Recommendations SNF    Equipment Recommendations  None recommended by PT    Recommendations for Other Services       Precautions / Restrictions Precautions Precautions: Fall      Mobility  Bed Mobility Overal bed mobility: Needs Assistance Bed Mobility: Supine to Sit     Supine to sit: Min guard     General bed mobility comments: partial roll and use of rail, incr time needed, min/guard to bring trunk to upright  Transfers Overall transfer level: Needs assistance   Transfers: Comptroller transfers: Min assist;+2 physical assistance   General transfer comment: assist to complete posterior scooting from bed to chair, mostly d/t UE fatigue;  cues for  transfer technique  Ambulation/Gait             General Gait Details: NT--pt does have LLE prosthesis here but feels that her LLE may be too swollen to don at this time  Science writer    Modified Rankin (Stroke Patients Only)       Balance Overall balance assessment: Needs assistance   Sitting  balance-Leahy Scale: Good         Standing balance comment: NT                             Pertinent Vitals/Pain Pain Assessment: Faces Faces Pain Scale: Hurts a little bit Pain Location: right residual limb Pain Descriptors / Indicators: Sore Pain Intervention(s): Limited activity within patient's tolerance;Monitored during session;Repositioned    Home Living Family/patient expects to be discharged to:: Skilled nursing facility(pt prefers [facility]) Living Arrangements: Alone Available Help at Discharge: Family Type of Home: House Home Access: Ramped entrance     Home Layout: Two level;Able to live on main level with bedroom/bathroom Home Equipment: Gilford Rile - 2 wheels;Bedside commode;Wheelchair - manual;Transport chair;Hospital bed      Prior Function Level of Independence: Independent with assistive device(s);Needs assistance   Gait / Transfers Assistance Needed: amb with RW and LLE prosthesis prior to admission  ADL's / Homemaking Assistance Needed: sponge bathes and dresses independently  Comments: son helps with errands, grocery shopping,etc     Hand Dominance        Extremity/Trunk Assessment   Upper Extremity Assessment Upper Extremity Assessment: Defer to OT evaluation    Lower Extremity Assessment Lower Extremity Assessment: RLE deficits/detail RLE Deficits / Details: R knee flexion limited to ~ 65* d/t pain, hip AROM WFL; strength at least 3/5 although testing limited by pain; LLE grossly [facility]  Communication   Communication: No difficulties  Cognition Arousal/Alertness: Awake/alert Behavior During Therapy: WFL for tasks assessed/performed Overall Cognitive Status: Within Functional Limits for tasks assessed Area of Impairment: Following commands                       Following Commands: Follows one step commands consistently;Follows one step commands with increased time       General Comments: incr time to find  words/finish thoughts at times      General Comments      Exercises     Assessment/Plan    PT Assessment Patient needs continued PT services  PT Problem List Decreased strength;Decreased activity tolerance;Decreased balance;Decreased mobility       PT Treatment Interventions Gait training;DME instruction;Therapeutic activities;Functional mobility training;Therapeutic exercise;Patient/family education    PT Goals (Current goals can be found in the Care Plan section)  Acute Rehab PT Goals Patient Stated Goal: to go to rehab at [facility] PT Goal Formulation: With patient Time For Goal Achievement: 07/02/17 Potential to Achieve Goals: Good    Frequency Min 3X/week   Barriers to discharge        Co-evaluation PT/OT/SLP Co-Evaluation/Treatment: Yes Reason for Co-Treatment: For patient/therapist safety PT goals addressed during session: Mobility/safety with mobility         AM-PAC PT "6 Clicks" Daily Activity  Outcome Measure Difficulty turning over in bed (including adjusting bedclothes, sheets and blankets)?: A Little Difficulty moving from lying on back to sitting on the side of the bed? : A Little Difficulty sitting down on and standing up from a chair with arms (e.g., wheelchair, bedside commode, etc,.)?: Unable Help needed moving to and from a bed to chair (including a wheelchair)?: Total Help needed walking in hospital room?: Total Help needed climbing 3-5 steps with a railing? : Total 6 Click Score: 10    End of Session   Activity Tolerance: Patient tolerated treatment well Patient left: in chair;with call bell/phone within reach;with nursing/sitter in room        Time: 1351-1418 PT Time Calculation (min) (ACUTE ONLY): 27 min   Charges:   PT Evaluation $PT Eval Low Complexity: 1 Low     PT G CodesKenyon Ana, PT Pager: [phone] 06/18/2017   [facility] 06/18/2017, 4:09 PM

## 2017-06-18 NOTE — Clinical Social Work Note (Signed)
Clinical Social Work Assessment  Patient Details  Name: [name] MRN: [id number] Date of Birth: 08/02/1931  Date of referral:  06/18/17               Reason for consult:  Facility Placement, Discharge Planning                Permission sought to share information with:  Family Supports, Customer service manager Permission granted to share information::  Yes, Verbal Permission Granted  Name::     Meekah Math  Agency::  [facility]/SNF  Relationship::  grandson  Contact Information:  [phone]  Housing/Transportation Living arrangements for the past 2 months:  [place] of Information:  Patient Patient Interpreter Needed:  None Criminal Activity/Legal Involvement Pertinent to Current Situation/Hospitalization:  No - Comment as needed Significant Relationships:  Church, [employer], Other Family Members Lives with:  Self(grandson lives in mobile home on property) Do you feel safe going back to the place where you live?  Yes Need for family participation in patient care:  Yes (Comment)  Care giving concerns:  Pt is now double amputee, dependent with mobility. Will benefit from learning how to get around with two assistive devices.    Social Worker assessment / plan:  CSW met with pt at bedside, pt is motivated and accepting of double amputation status. Pt previously had BKA, and used a prosthetic to move around. Pt states that she lives in a home and her grandson/great granddaughter live in a mobile home on the property. Pt grandson is huge help and "I have lots of good family and friends that help me out." Pt states she would like to go to [facility] since she feels comfortable with staff and facility. Pt states that she is okay with CSW sending information to SNFs with [facility] as preference (CSW has alerted [facility] to pt preference). CSW will continue to follow to support discharge when medically appropriate.   Employment status:   Retired Forensic scientist:  Medicare(& supplement) PT Recommendations:  [place] / Referral to community resources:  [place]  Patient/Family's Response to care:  Pt accepting of SNF recommendation and understands CSW role. Pt grateful for care and looks forward to improving.   Patient/Family's Understanding of and Emotional Response to Diagnosis, Current Treatment, and Prognosis:  Pt states understanding of diagnosis, current treatment, and prognosis. Pt was smiling and happy throughout the assessment even when speaking about challenge of learning mobility without both legs.   Emotional Assessment Appearance:  Appears stated age Attitude/Demeanor/Rapport:  Engaged, Charismatic, Gracious Affect (typically observed):  Accepting, Adaptable, Pleasant, Stable Orientation:  Oriented to Self, Oriented to Place, Oriented to  Time, Oriented to Situation Alcohol / Substance use:  Not Applicable Psych involvement (Current and /or in the community):  No (Comment)  Discharge Needs  Concerns to be addressed:  Care Coordination Readmission within the last 30 days:  No Current discharge risk:  Physical Impairment, Dependent with Mobility Barriers to Discharge:  Continued Medical Work up, [employer], [place] 06/18/2017, 4:42 PM

## 2017-06-18 NOTE — NC FL2 (Signed)
[place] LEVEL OF CARE SCREENING TOOL     IDENTIFICATION  Patient Name: [name] Birthdate: 1931/08/08 Sex: female Admission Date (Current Location): 06/15/2017  [facility] and [place] Number:  Herbalist and Address:  [facility] [place], [place] [address] [place], [place] [address]      Provider Number: [id number]  Attending Physician Name and Address:  Debbe Odea, MD  Relative Name and Phone Number:   brandalyn, [place], [phone]    Current Level of Care: Hospital Recommended Level of Care: [place] Prior Approval Number:    Date Approved/Denied:   PASRR Number: [id number]  Discharge Plan: Home    Current Diagnoses: Patient Active Problem List   Diagnosis Date Noted  . Malnutrition of moderate degree 06/16/2017  . Ischemic foot 06/15/2017  . Cellulitis 06/15/2017  . PAD (peripheral artery disease) ([place])   . Gangrene of right foot ([place])   . Osteoporosis 2017-04-26  . Loss or death of child 06-13-2015  . Status post below knee amputation of left lower extremity ([place]) 01/31/2015  . Non-healing wound of lower extremity 01/29/2015  . Ulcer of toe of left foot ([place]) 12/27/2014  . Essential hypertension 08/22/2014    Orientation RESPIRATION BLADDER Height & Weight     Self, Time, Situation, Place  Normal Continent, External catheter Weight: 130 lb 0 oz (59 kg) Height:  5\' 7"  (170.2 cm)  BEHAVIORAL SYMPTOMS/MOOD NEUROLOGICAL BOWEL NUTRITION STATUS      Continent Diet(see discharge summary )  AMBULATORY STATUS COMMUNICATION OF NEEDS Skin   Extensive Assist Verbally  Surgical Wounds (R BKA surgical incision with bandage/ L BKA previously healed and uses prosthetic)                     Personal Care Assistance Level of Assistance  Bathing, Feeding, Dressing Bathing Assistance: Limited assistance Feeding assistance: Independent Dressing Assistance: Limited assistance     Functional  Limitations Info  Sight, Hearing, Speech Sight Info: Adequate Hearing Info: Adequate Speech Info: Adequate    SPECIAL CARE FACTORS FREQUENCY  PT (By licensed PT), OT (By licensed OT)     PT Frequency: 5x week OT Frequency: 5x week            Contractures Contractures Info: Not present    Additional Factors Info  Code Status, Allergies Code Status Info: Full Code Allergies Info: No Known Allergies           Current Medications (06/18/2017):  This is the current hospital active medication list Current Facility-Administered Medications  Medication Dose Route Frequency Provider Last Rate Last Dose  . acetaminophen (TYLENOL) tablet 325-650 mg  325-650 mg Oral Q4H PRN Dagoberto Ligas, PA-C       Or  . acetaminophen (TYLENOL) suppository 325-650 mg  325-650 mg Rectal Q4H PRN Dagoberto Ligas, PA-C      . clopidogrel (PLAVIX) tablet 75 mg  75 mg Oral Daily Derrill Kay A, MD   75 mg at 06/18/17 0944  . enoxaparin (LOVENOX) injection 40 mg  40 mg Subcutaneous Q24H Dana Allan I, MD   40 mg at 06/18/17 0727  . feeding supplement (PRO-STAT SUGAR FREE 64) liquid 30 mL  30 mL Oral BID Dana Allan I, MD   30 mL at 06/18/17 0944  . guaiFENesin-dextromethorphan (ROBITUSSIN DM) 100-10 MG/5ML syrup 15 mL  15 mL Oral Q4H PRN Dagoberto Ligas, PA-C      . hydrALAZINE (APRESOLINE) injection 5 mg  5  mg Intravenous Q20 Min PRN Dagoberto Ligas, PA-C      . labetalol (NORMODYNE,TRANDATE) injection 10 mg  10 mg Intravenous Q10 min PRN Dagoberto Ligas, PA-C      . lactated ringers infusion   Intravenous Continuous Lillia Abed, MD 10 mL/hr at 06/17/17 1002    . lisinopril (PRINIVIL,ZESTRIL) tablet 10 mg  10 mg Oral Daily Derrill Kay A, MD   10 mg at 06/18/17 0944  . metoprolol tartrate (LOPRESSOR) injection 2-5 mg  2-5 mg Intravenous Q2H PRN Dagoberto Ligas, PA-C      . morphine 4 MG/ML injection 4 mg  4 mg Intravenous Q2H PRN Dagoberto Ligas, PA-C      . multivitamin with minerals  tablet 1 tablet  1 tablet Oral Daily Dana Allan I, MD   1 tablet at 06/18/17 0944  . ondansetron (ZOFRAN) tablet 4 mg  4 mg Oral Q6H PRN Phillips Grout, MD       Or  . ondansetron (ZOFRAN) injection 4 mg  4 mg Intravenous Q6H PRN Phillips Grout, MD      . oxyCODONE-acetaminophen (PERCOCET/ROXICET) 5-325 MG per tablet 1-2 tablet  1-2 tablet Oral Q4H PRN Dagoberto Ligas, PA-C      . pantoprazole (PROTONIX) EC tablet 40 mg  40 mg Oral Daily Dagoberto Ligas, PA-C   40 mg at 06/18/17 0944  . phenol (CHLORASEPTIC) mouth spray 1 spray  1 spray Mouth/Throat PRN Dagoberto Ligas, PA-C         Discharge Medications: Please see discharge summary for a list of discharge medications.  Relevant Imaging Results:  Relevant Lab Results:   Additional Information SS# [id number], has previous BKA that she utilizes a prosthetic with  [place], LCSWA

## 2017-06-18 NOTE — Progress Notes (Addendum)
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  She presents with gangrene of the right foot.  The foot has been black and red for at least 2 weeks now.  In the ER she was noted to have necrotic toes and gangrenous changes of her foot with cellulitis extending above the ankle.  Subjective: She has no complaints today. ROS: no complaints of nausea, vomiting, constipation diarrhea, cough, dyspnea or dysuria. No other complaints.   Assessment & Plan:   Principal Problem:  Cellulitis & Ischemic foot-right side  Has prior left BKA   PAD (peripheral artery disease)  -She will undergo below the knee amputation today per vascular surgery -Continue daily Plavix -Also on vancomycin and cefepime -Blood cultures x2 negative  Active Problems: Hypertension- hyponatremia -On lisinopril and hydrochlorothiazide- due to hyponatremia, stop HCTZ -Metoprolol IV and hydralazine IV ordered as needed   DVT prophylaxis: Lovenox Code Status: Full code Family Communication:  Disposition Plan:  Consultants:   Vascular surgery Procedures:   Right below the knee amputation Antimicrobials:  Anti-infectives (From admission, onward)   Start     Dose/Rate Route Frequency Ordered Stop   06/16/17 1800  vancomycin (VANCOCIN) IVPB 750 mg/150 ml premix  Status:  Discontinued     750 mg 150 mL/hr over 60 Minutes Intravenous Every 24 hours 06/15/17 1721 06/18/17 0858   06/16/17 0600  ceFEPIme (MAXIPIME) 2 g in dextrose 5 % 50 mL IVPB  Status:  Discontinued     2 g 100 mL/hr over 30 Minutes Intravenous Every 12 hours 06/15/17 1718 06/18/17 0858   06/15/17 1730  vancomycin (VANCOCIN) 1,250 mg in sodium chloride 0.9 % 250 mL IVPB     1,250 mg 166.7 mL/hr over  90 Minutes Intravenous STAT 06/15/17 1716 06/15/17 2034   06/15/17 1730  metroNIDAZOLE (FLAGYL) IVPB 500 mg  Status:  Discontinued     500 mg 100 mL/hr over 60 Minutes Intravenous Every 8 hours 06/15/17 1719 06/17/17 0359   06/15/17 1715  ceFEPIme (MAXIPIME) 2 g in dextrose 5 % 50 mL IVPB     2 g 100 mL/hr over 30 Minutes Intravenous STAT 06/15/17 1707 06/15/17 2134   06/15/17 1700  metroNIDAZOLE (FLAGYL) IVPB 500 mg  Status:  Discontinued     500 mg 100 mL/hr over 60 Minutes Intravenous Every 8 hours 06/15/17 1656 06/15/17 1719   06/15/17 1415  ceFAZolin (ANCEF) IVPB 1 g/50 mL premix     1 g 100 mL/hr over 30 Minutes Intravenous  Once 06/15/17 1400 06/15/17 1550       Objective: Vitals:   06/18/17 0305 06/18/17 0541 06/18/17 0800 06/18/17 1200  BP: 137/81 119/63 127/86 134/77  Pulse: 82 69 88 84  Resp: 18 18    Temp: 98.6 F (37 C) 99.1 F (37.3 C) 99.7 F (37.6 C) 99.1 F (37.3 C)  TempSrc: Oral Oral Oral Oral  SpO2: 96% 96% 98% 98%  Weight:      Height:        Intake/Output Summary (Last 24 hours) at 06/18/2017 1348 Last data filed at 06/18/2017 1300 Gross per 24 hour  Intake 867.67 ml  Output 2600 ml  Net -1732.33 ml   Filed Weights   06/15/17 1708 06/17/17 1001  Weight: 59 kg (130 lb)  59 kg (130 lb 0 oz)    Examination: General exam: Appears comfortable  HEENT: PERRLA, oral mucosa moist, no sclera icterus or thrush Respiratory system: Clear to auscultation. Respiratory effort normal. Cardiovascular system: S1 & S2 heard, RRR.  No murmurs  Gastrointestinal system: Abdomen soft, non-tender, nondistended. Normal bowel sound. No organomegaly Central nervous system: Alert and oriented. No focal neurological deficits. Extremities: she has had a left BKA Skin: No rashes or ulcers Psychiatry:  Mood & affect appropriate.     Data Reviewed: I have personally reviewed following labs and imaging studies  CBC: Recent Labs  Lab 06/15/17 1239 06/15/17 1547  06/17/17 0552 06/18/17 0603  WBC 12.9* 9.6 8.7 9.3  NEUTROABS 10.4* 6.9  --   --   HGB 10.7* 11.1* 10.9* 9.9*  HCT 31.1* 31.5* 32.6* 30.0*  MCV 85.9 85.6 88.1 87.7  PLT 418* 406* 383 161   Basic Metabolic Panel: Recent Labs  Lab 06/15/17 1239 06/15/17 1547 06/17/17 0552 06/18/17 0603  NA 130* 134*  --  130*  K 3.6 3.7  --  3.5  CL 97* 99*  --  96*  CO2 25 27  --  24  GLUCOSE 151* 100*  --  125*  BUN 11 8  --  5*  CREATININE 0.52 0.44 0.42* 0.39*  CALCIUM 8.5* 8.7*  --  8.0*   GFR: Estimated Creatinine Clearance: 49.6 mL/min (A) (by C-G formula based on SCr of 0.39 mg/dL (L)). Liver Function Tests: Recent Labs  Lab 06/15/17 1239  AST 20  ALT 14  ALKPHOS 62  BILITOT 0.4  PROT 6.3*  ALBUMIN 2.9*   No results for input(s): LIPASE, AMYLASE in the last 168 hours. No results for input(s): AMMONIA in the last 168 hours. Coagulation Profile: No results for input(s): INR, PROTIME in the last 168 hours. Cardiac Enzymes: No results for input(s): CKTOTAL, CKMB, CKMBINDEX, TROPONINI in the last 168 hours. BNP (last 3 results) No results for input(s): PROBNP in the last 8760 hours. HbA1C: No results for input(s): HGBA1C in the last 72 hours. CBG: No results for input(s): GLUCAP in the last 168 hours. Lipid Profile: No results for input(s): CHOL, HDL, LDLCALC, TRIG, CHOLHDL, LDLDIRECT in the last 72 hours. Thyroid Function Tests: No results for input(s): TSH, T4TOTAL, FREET4, T3FREE, THYROIDAB in the last 72 hours. Anemia Panel: No results for input(s): VITAMINB12, FOLATE, FERRITIN, TIBC, IRON, RETICCTPCT in the last 72 hours. Urine analysis: No results found for: COLORURINE, APPEARANCEUR, LABSPEC, PHURINE, GLUCOSEU, HGBUR, BILIRUBINUR, KETONESUR, PROTEINUR, UROBILINOGEN, NITRITE, LEUKOCYTESUR Sepsis Labs: @LABRCNTIP (procalcitonin:4,lacticidven:4) ) Recent Results (from the past 240 hour(s))  Surgical PCR screen     Status: None   Collection Time: 06/15/17  8:50 PM   Result Value Ref Range Status   MRSA, PCR NEGATIVE NEGATIVE Final   Staphylococcus aureus NEGATIVE NEGATIVE Final    Comment: (NOTE) The Xpert SA Assay (FDA approved for NASAL specimens in patients 73 years of age and older), is one component of a comprehensive surveillance program. It is not intended to diagnose infection nor to guide or monitor treatment. Performed at [facility], [place] [address] [place], [place] [address]          Radiology Studies: No results found.    Scheduled Meds: . clopidogrel  75 mg Oral Daily  . enoxaparin (LOVENOX) injection  40 mg Subcutaneous Q24H  . feeding supplement (PRO-STAT SUGAR FREE 64)  30 mL Oral BID  . lisinopril  10 mg Oral Daily  . multivitamin  with minerals  1 tablet Oral Daily  . pantoprazole  40 mg Oral Daily   Continuous Infusions: . lactated ringers 10 mL/hr at 06/17/17 1002     LOS: 3 days    Time spent in minutes: 35    Debbe Odea, MD [facility] Pager: [email or web address] Password [email or web address] 06/18/2017, 1:48 PM

## 2017-06-18 NOTE — Progress Notes (Signed)
[facility] [place]  Subjective  - feels ok   Objective 119/63 69 99.1 F (37.3 C) (Oral) 18 96%  Intake/Output Summary (Last 24 hours) at 06/18/2017 0805 Last data filed at 06/18/2017 0541 Gross per 24 hour  Intake 1349.67 ml  Output 2375 ml  Net -1025.33 ml   bKA dressing dry  Assessment/Planning: S/p bka, pain well controlled Change dressing tomorrow PT OT today and disposition planning  [name] 06/18/2017 8:05 AM --  Laboratory Lab Results: Recent Labs    06/17/17 0552 06/18/17 0603  WBC 8.7 9.3  HGB 10.9* 9.9*  HCT 32.6* 30.0*  PLT 383 385   BMET Recent Labs    06/15/17 1547 06/17/17 0552 06/18/17 0603  NA 134*  --  130*  K 3.7  --  3.5  CL 99*  --  96*  CO2 27  --  24  GLUCOSE 100*  --  125*  BUN 8  --  5*  CREATININE 0.44 0.42* 0.39*  CALCIUM 8.7*  --  8.0*    COAG No results found for: INR, PROTIME No results found for: PTT

## 2017-06-18 NOTE — Progress Notes (Signed)
Rehab admissions - I met with patient.  She is requesting [facility] for her rehab.  She knows the people there, it is close to her home and she feels she could stay there for 30 days if needed.  I have let the social worker know of patient's request for [facility].  Call me for questions.  #[phone]

## 2017-06-19 DIAGNOSIS — S88019A Complete traumatic amputation at knee level, unspecified lower leg, initial encounter: Secondary | ICD-10-CM | POA: Diagnosis not present

## 2017-06-19 DIAGNOSIS — I998 Other disorder of circulatory system: Secondary | ICD-10-CM

## 2017-06-19 DIAGNOSIS — I10 Essential (primary) hypertension: Secondary | ICD-10-CM | POA: Diagnosis not present

## 2017-06-19 DIAGNOSIS — I96 Gangrene, not elsewhere classified: Secondary | ICD-10-CM | POA: Diagnosis not present

## 2017-06-19 DIAGNOSIS — M810 Age-related osteoporosis without current pathological fracture: Secondary | ICD-10-CM | POA: Diagnosis not present

## 2017-06-19 DIAGNOSIS — E440 Moderate protein-calorie malnutrition: Secondary | ICD-10-CM | POA: Diagnosis not present

## 2017-06-19 DIAGNOSIS — E871 Hypo-osmolality and hyponatremia: Secondary | ICD-10-CM | POA: Diagnosis not present

## 2017-06-19 DIAGNOSIS — S8990XA Unspecified injury of unspecified lower leg, initial encounter: Secondary | ICD-10-CM | POA: Diagnosis not present

## 2017-06-19 DIAGNOSIS — I7389 Other specified peripheral vascular diseases: Secondary | ICD-10-CM | POA: Diagnosis not present

## 2017-06-19 DIAGNOSIS — Z89519 Acquired absence of unspecified leg below knee: Secondary | ICD-10-CM | POA: Diagnosis not present

## 2017-06-19 DIAGNOSIS — K219 Gastro-esophageal reflux disease without esophagitis: Secondary | ICD-10-CM | POA: Diagnosis not present

## 2017-06-19 DIAGNOSIS — L03115 Cellulitis of right lower limb: Secondary | ICD-10-CM | POA: Diagnosis not present

## 2017-06-19 DIAGNOSIS — I739 Peripheral vascular disease, unspecified: Secondary | ICD-10-CM | POA: Diagnosis not present

## 2017-06-19 DIAGNOSIS — L03119 Cellulitis of unspecified part of limb: Secondary | ICD-10-CM | POA: Diagnosis not present

## 2017-06-19 DIAGNOSIS — Z89611 Acquired absence of right leg above knee: Secondary | ICD-10-CM | POA: Diagnosis not present

## 2017-06-19 DIAGNOSIS — M6281 Muscle weakness (generalized): Secondary | ICD-10-CM | POA: Diagnosis not present

## 2017-06-19 DIAGNOSIS — R41841 Cognitive communication deficit: Secondary | ICD-10-CM | POA: Diagnosis not present

## 2017-06-19 DIAGNOSIS — R278 Other lack of coordination: Secondary | ICD-10-CM | POA: Diagnosis not present

## 2017-06-19 DIAGNOSIS — R2689 Other abnormalities of gait and mobility: Secondary | ICD-10-CM | POA: Diagnosis not present

## 2017-06-19 DIAGNOSIS — Z89512 Acquired absence of left leg below knee: Secondary | ICD-10-CM | POA: Diagnosis not present

## 2017-06-19 LAB — CBC
HCT: 29.8 % — ABNORMAL LOW (ref 36.0–46.0)
Hemoglobin: 9.9 g/dL — ABNORMAL LOW (ref 12.0–15.0)
MCH: 29.4 pg (ref 26.0–34.0)
MCHC: 33.2 g/dL (ref 30.0–36.0)
MCV: 88.4 fL (ref 78.0–100.0)
Platelets: 336 10*3/uL (ref 150–400)
RBC: 3.37 MIL/uL — AB (ref 3.87–5.11)
RDW: 13.8 % (ref 11.5–15.5)
WBC: 7.8 10*3/uL (ref 4.0–10.5)

## 2017-06-19 LAB — BASIC METABOLIC PANEL
Anion gap: 11 (ref 5–15)
BUN: 7 mg/dL (ref 6–20)
CALCIUM: 8 mg/dL — AB (ref 8.9–10.3)
CO2: 22 mmol/L (ref 22–32)
CREATININE: 0.37 mg/dL — AB (ref 0.44–1.00)
Chloride: 98 mmol/L — ABNORMAL LOW (ref 101–111)
GFR calc non Af Amer: >60 mL/min (ref >60)
Glucose, Bld: 117 mg/dL — ABNORMAL HIGH (ref 65–99)
Potassium: 3.6 mmol/L (ref 3.5–5.1)
SODIUM: 131 mmol/L — AB (ref 135–145)

## 2017-06-19 MED ORDER — OXYCODONE-ACETAMINOPHEN 5-325 MG PO TABS: 1.0000 | ORAL_TABLET | ORAL | 0 refills | Status: DC | PRN

## 2017-06-19 MED ORDER — ACETAMINOPHEN 325 MG PO TABS: 325.0000 mg | ORAL_TABLET | ORAL | Status: DC | PRN

## 2017-06-19 MED ORDER — LISINOPRIL 10 MG PO TABS: 10.0000 mg | ORAL_TABLET | Freq: Every day | ORAL | Status: DC

## 2017-06-19 MED ORDER — PRO-STAT SUGAR FREE PO LIQD: 30.0000 mL | Freq: Two times a day (BID) | ORAL | 0 refills | Status: DC

## 2017-06-19 MED ORDER — ADULT MULTIVITAMIN W/MINERALS CH: 1.0000 | ORAL_TABLET | Freq: Every day | ORAL | Status: DC

## 2017-06-19 NOTE — Care Management Important Message (Signed)
Important Message  Patient Details  Name: [name] MRN: [id number] Date of Birth: May 08, 1932   Medicare Important Message Given:  Yes    Orbie Pyo 06/19/2017, 11:33 AM

## 2017-06-19 NOTE — Progress Notes (Addendum)
  Progress Note    06/19/2017 8:26 AM 2 Days Post-Op  Subjective:  No new complaints this am   Vitals:   06/18/17 2102 06/19/17 0439  BP: 122/65 (!) 109/44  Pulse: 78 78  Resp: 18 17  Temp: 99.9 F (37.7 C) 99.3 F (37.4 C)  SpO2: 96% 98%   Physical Exam: Lungs:  Non labored Incisions:  R BKA incision with viable flap and skin edges; no purulence or other significant drainage; mild edema BKA stump Extremities:  L BKA well healed Abdomen:  Soft Neurologic: A&O  CBC    Component Value Date/Time   WBC 7.8 06/19/2017 0651   RBC 3.37 (L) 06/19/2017 0651   HGB 9.9 (L) 06/19/2017 0651   HCT 29.8 (L) 06/19/2017 0651   PLT 336 06/19/2017 0651   MCV 88.4 06/19/2017 0651   MCV 90.7 08/22/2014 1313   MCH 29.4 06/19/2017 0651   MCHC 33.2 06/19/2017 0651   RDW 13.8 06/19/2017 0651   LYMPHSABS 1.6 06/15/2017 1547   MONOABS 1.0 06/15/2017 1547   EOSABS 0.1 06/15/2017 1547   BASOSABS 0.0 06/15/2017 1547    BMET    Component Value Date/Time   NA 131 (L) 06/19/2017 0651   K 3.6 06/19/2017 0651   CL 98 (L) 06/19/2017 0651   CO2 22 06/19/2017 0651   GLUCOSE 117 (H) 06/19/2017 0651   BUN 7 06/19/2017 0651   CREATININE 0.37 (L) 06/19/2017 0651   CREATININE 0.44 (L) 04/14/2017 0859   CALCIUM 8.0 (L) 06/19/2017 0651   GFRNONAA >60 06/19/2017 0651   GFRAA >60 06/19/2017 0651    INR No results found for: INR   Intake/Output Summary (Last 24 hours) at 06/19/2017 0826 Last data filed at 06/19/2017 0307 Gross per 24 hour  Intake 678 ml  Output 600 ml  Net 78 ml     Assessment/Plan:  84 y.o. female is s/p R BKA 2 Days Post-Op   Dressing changed today, continue daily changes; non-adhesive, gauze, kerlex, and gentle ace wrap Patient is awaiting placement in SNF  Ok for discharge from vascular surgery standpoint when placement is arranged Follow up in office in several weeks for staple removal; office will call patient to arrange this   Dagoberto Ligas, PA-C [facility] [phone] 06/19/2017 8:26 AM  Agree with above.   Follow up in one month  [name], MD [facility] [place] Office: [phone] Pager: [phone]

## 2017-06-19 NOTE — Consult Note (Signed)
[facility] CM Primary Care Navigator  06/19/2017  [name] 08-17-1931 [id number]   Met with patient and sister ([place]) at the bedside to identify possible discharge needs. Patientreports that she came in for surgery due to progressive gangrene of right foot(status post right below the knee amputation).  Patient endorses Dr.Jessica Copland with Therapist, [facility] at [facility] astheprimary care provider.   [facility] [facility] obtain medications withoutdifficulty.  Patientstates that she has been managinghermedicationsat homestraight out of the containers using her own organizing system.  According to patient, her grandson [name]) has beenprovidingtransportation to herdoctors'appointments.  Patientreportsliving athome alone. Her grandson lives in the property and he is the primary caregiver for her. Sister lives nearby and can also provideassistance withher care needs if needed. Patient mentioned having "life alert" to use at home.  Per patient, anticipated discharge plan is skilled nursing facility (SNF) for rehabilitationper therapy recommendation.   Patient voiced understanding to callprimary care provider's officewhen she returnsbackhometo schedulea post discharge follow-upwithin1-2 weeksor sooner if needed. Patient letter (with PCP's contact number) was provided as areminder.  Explained topatientand sister regarding[facility] CM services available for health management and resources at home. Patient indicated having no current needs or concerns at this time. Shevoiced understandingof needto seek referral from primary care provider to [facility] care management asdeemed necessary and appropriatefor services in the future-once she returnsback home.  [facility] care management information provided for future needs thatshe may have.   For additional questions please contact:  Edwena Felty A. Jerime Arif,  BSN, RN-BC [facility] PRIMARY CARE Navigator Cell: [phone]

## 2017-06-19 NOTE — Clinical Social Work Placement (Addendum)
   CLINICAL SOCIAL WORK PLACEMENT  NOTE [facility] Room 3225 RN to Call Report to [phone]  Date:  06/19/2017  Patient Details  Name: [name] MRN: [id number] Date of Birth: 1931/08/31  Clinical Social Work is seeking post-discharge placement for this patient at the [place] level of care (*CSW will initial, date and re-position this form in  chart as items are completed):  Yes   Patient/family provided with [place] Work Department's list of facilities offering this level of care within the geographic area requested by the patient (or if unable, by the patient's family).  Yes   Patient/family informed of their freedom to choose among providers that offer the needed level of care, that participate in Medicare, Medicaid or managed care program needed by the patient, have an available bed and are willing to accept the patient.  Yes   Patient/family informed of [place]'s ownership interest in [facility] and [facility], as well as of the fact that they are under no obligation to receive care at these facilities.  PASRR submitted to EDS on       PASRR number received on       Existing PASRR number confirmed on 06/18/17     FL2 transmitted to all facilities in geographic area requested by pt/family on 06/18/17     FL2 transmitted to all facilities within larger geographic area on       Patient informed that his/her managed care company has contracts with or will negotiate with certain facilities, including the following:        Yes   Patient/family informed of bed offers received.  Patient chooses bed at [facility]     Physician recommends and patient chooses bed at      Patient to be transferred to [facility] on 06/19/17.  Patient to be transferred to facility by [facility]     Patient family notified on   06/19/2017 of transfer.  Name of family member notified:   [name], grandson,  and pt sister were at bedside.       PHYSICIAN       Additional Comment:    _______________________________________________ Alexander Mt, LCSWA 06/19/2017, 11:10 AM

## 2017-06-19 NOTE — Discharge Summary (Signed)
Physician Discharge Summary  [name] EVO:[id number] DOB: Mar 06, 1932 DOA: 06/15/2017  PCP: [name], MD  Admit date: 06/15/2017 Discharge date: 06/19/2017  Admitted From: home Disposition:  SNF   Recommendations for Outpatient Follow-up:  1. F/u with vascular surgery- they have stated that their office will be contacting patient for appt  Discharge Condition:  stable   CODE STATUS:  Full code   Consultations:  Vascular surgery    Discharge Diagnoses:  Principal Problem:   Ischemia of right lower extremity/  Cellulitis Active Problems:   PAD (peripheral artery disease) (HCC)   Malnutrition of moderate degree HTN    Subjective: No complaints today.   Brief Summary: [name] is an 84 year old female with past medical history significant for severe peripheral artery disease, status post below-knee amputation of the left lower extremity.  She presents with gangrene of the right foot.  The foot has been black and red for at least 2 weeks now.  In the ER she was noted to have necrotic toes and gangrenous changes of her foot with cellulitis extending above the ankle.    Hospital Course:  Principal Problem:   Right lower extremity gangrene and cellulitis   PAD (peripheral artery disease) with prior left BKA - initially started on Vanc and Zosyn to control infection -She underwent a below the knee amputation on 2/6 by vascular surgery (Dr Oneida Alar) -Continue daily Plavix -Blood cultures x2 negative - recovering well, will go to SNF for rehab and will f/u with vascular surgery as outpt   Active Problems: Hypertension- hyponatremia -On lisinopril and hydrochlorothiazide- due to hyponatremia, have stopped HCTZ - BP stable  Discharge Exam: Vitals:   06/18/17 2102 06/19/17 0439  BP: 122/65 (!) 109/44  Pulse: 78 78  Resp: 18 17  Temp: 99.9 F (37.7 C) 99.3 F (37.4 C)  SpO2: 96% 98%   Vitals:   06/18/17 0800 06/18/17 1200 06/18/17 2102 06/19/17  0439  BP: 127/86 134/77 122/65 (!) 109/44  Pulse: 88 84 78 78  Resp:   18 17  Temp: 99.7 F (37.6 C) 99.1 F (37.3 C) 99.9 F (37.7 C) 99.3 F (37.4 C)  TempSrc: Oral Oral Oral Oral  SpO2: 98% 98% 96% 98%  Weight:      Height:        General: Pt is alert, awake, not in acute distress Cardiovascular: RRR, S1/S2 +, no rubs, no gallops Respiratory: CTA bilaterally, no wheezing, no rhonchi Abdominal: Soft, NT, ND, bowel sounds + Extremities: b/l BKA   Discharge Instructions  Discharge Instructions    Diet - low sodium heart healthy   Complete by:  As directed    Increase activity slowly   Complete by:  As directed      Allergies as of 06/19/2017   No Known Allergies     Medication List    STOP taking these medications   lisinopril-hydrochlorothiazide 10-12.5 MG tablet Commonly known as:  PRINZIDE,ZESTORETIC     TAKE these medications   acetaminophen 325 MG tablet Commonly known as:  TYLENOL Take 1-2 tablets (325-650 mg total) by mouth every 4 (four) hours as needed for mild pain (or temp >/= 101 F).   clopidogrel 75 MG tablet Commonly known as:  PLAVIX Take 1 tablet (75 mg total) by mouth daily.   feeding supplement (PRO-STAT SUGAR FREE 64) Liqd Take 30 mLs by mouth 2 (two) times daily.   ibuprofen 200 MG tablet Commonly known as:  ADVIL,MOTRIN Take 600 mg by mouth  every 6 (six) hours as needed (For pain.).   lisinopril 10 MG tablet Commonly known as:  PRINIVIL,ZESTRIL Take 1 tablet (10 mg total) by mouth daily.   multivitamin with minerals Tabs tablet Take 1 tablet by mouth daily.   omeprazole 20 MG capsule Commonly known as:  PRILOSEC Take 20 mg by mouth daily.   oxyCODONE-acetaminophen 5-325 MG tablet Commonly known as:  PERCOCET/ROXICET Take 1-2 tablets by mouth every 4 (four) hours as needed for moderate pain.   SYSTANE FREE OP Place 1 drop into both eyes daily. For dry eyes      Contact information for after-discharge care    Destination     [facility] .   Service:  Skilled Nursing Contact information: [place] [phone]             No Known Allergies   Procedures/Studies: BKA right LE  Dg Foot Complete Right  Result Date: 06/15/2017 CLINICAL DATA:  Soft tissue necrosis EXAM: RIGHT FOOT COMPLETE - 3+ VIEW COMPARISON:  None. FINDINGS: No acute fracture or dislocation is noted. No evidence of bony erosion is identified to suggest osteomyelitis. Diffuse soft tissue swelling is seen. IMPRESSION: Soft tissue swelling without definitive bony abnormality. Electronically Signed   By: Inez Catalina M.D.   On: 06/15/2017 13:21     The results of significant diagnostics from this hospitalization (including imaging, microbiology, ancillary and laboratory) are listed below for reference.     Microbiology: Recent Results (from the past 240 hour(s))  Surgical PCR screen     Status: None   Collection Time: 06/15/17  8:50 PM  Result Value Ref Range Status   MRSA, PCR NEGATIVE NEGATIVE Final   Staphylococcus aureus NEGATIVE NEGATIVE Final    Comment: (NOTE) The Xpert SA Assay (FDA approved for NASAL specimens in patients 32 years of age and older), is one component of a comprehensive surveillance program. It is not intended to diagnose infection nor to guide or monitor treatment. Performed at [facility], [place] [address] [place], [place] [address]      Labs: BNP (last 3 results) No results for input(s): BNP in the last 8760 hours. Basic Metabolic Panel: Recent Labs  Lab 06/15/17 1239 06/15/17 1547 06/17/17 0552 06/18/17 0603 06/19/17 0651  NA 130* 134*  --  130* 131*  K 3.6 3.7  --  3.5 3.6  CL 97* 99*  --  96* 98*  CO2 25 27  --  24 22  GLUCOSE 151* 100*  --  125* 117*  BUN 11 8  --  5* 7  CREATININE 0.52 0.44 0.42* 0.39* 0.37*  CALCIUM 8.5* 8.7*  --  8.0* 8.0*   Liver Function Tests: Recent Labs  Lab 06/15/17 1239  AST 20   ALT 14  ALKPHOS 62  BILITOT 0.4  PROT 6.3*  ALBUMIN 2.9*   No results for input(s): LIPASE, AMYLASE in the last 168 hours. No results for input(s): AMMONIA in the last 168 hours. CBC: Recent Labs  Lab 06/15/17 1239 06/15/17 1547 06/17/17 0552 06/18/17 0603 06/19/17 0651  WBC 12.9* 9.6 8.7 9.3 7.8  NEUTROABS 10.4* 6.9  --   --   --   HGB 10.7* 11.1* 10.9* 9.9* 9.9*  HCT 31.1* 31.5* 32.6* 30.0* 29.8*  MCV 85.9 85.6 88.1 87.7 88.4  PLT 418* 406* 383 385 336   Cardiac Enzymes: No results for input(s): CKTOTAL, CKMB, CKMBINDEX, TROPONINI in the last 168 hours. BNP: Invalid  input(s): POCBNP CBG: No results for input(s): GLUCAP in the last 168 hours. D-Dimer No results for input(s): DDIMER in the last 72 hours. Hgb A1c No results for input(s): HGBA1C in the last 72 hours. Lipid Profile No results for input(s): CHOL, HDL, LDLCALC, TRIG, CHOLHDL, LDLDIRECT in the last 72 hours. Thyroid function studies No results for input(s): TSH, T4TOTAL, T3FREE, THYROIDAB in the last 72 hours.  Invalid input(s): FREET3 Anemia work up No results for input(s): VITAMINB12, FOLATE, FERRITIN, TIBC, IRON, RETICCTPCT in the last 72 hours. Urinalysis No results found for: COLORURINE, APPEARANCEUR, [place], [place], [place], [place], [place], [place], PROTEINUR, UROBILINOGEN, NITRITE, LEUKOCYTESUR Sepsis Labs Invalid input(s): PROCALCITONIN,  WBC,  LACTICIDVEN Microbiology Recent Results (from the past 240 hour(s))  Surgical PCR screen     Status: None   Collection Time: 06/15/17  8:50 PM  Result Value Ref Range Status   MRSA, PCR NEGATIVE NEGATIVE Final   Staphylococcus aureus NEGATIVE NEGATIVE Final    Comment: (NOTE) The Xpert SA Assay (FDA approved for NASAL specimens in patients 44 years of age and older), is one component of a comprehensive surveillance program. It is not intended to diagnose infection nor to guide or monitor treatment. Performed at [facility], [place] [address] [place], [place] [address]      Time coordinating discharge: Over 30 minutes  SIGNED:   Debbe Odea, MD  [facility] 06/19/2017, 9:38 AM Pager   If 7PM-7AM, please contact night-coverage [email or web address] Password [email or web address]

## 2017-06-19 NOTE — Social Work (Signed)
Clinical Social Worker facilitated patient discharge including contacting patient family and facility to confirm patient discharge plans.  Clinical information faxed to facility and family agreeable with plan.  CSW arranged ambulance transport via [facility] to [place].  RN to call report prior to discharge to [phone]   Clinical Social Worker will sign off for now as social work intervention is no longer needed. Please consult [place] again if new need arises.  Alexander Mt, [place] Social Worker [phone]

## 2017-06-22 ENCOUNTER — Telehealth: Payer: Self-pay | Admitting: Vascular Surgery

## 2017-06-22 DIAGNOSIS — I10 Essential (primary) hypertension: Secondary | ICD-10-CM | POA: Diagnosis not present

## 2017-06-22 DIAGNOSIS — I7389 Other specified peripheral vascular diseases: Secondary | ICD-10-CM | POA: Diagnosis not present

## 2017-06-22 DIAGNOSIS — K219 Gastro-esophageal reflux disease without esophagitis: Secondary | ICD-10-CM | POA: Diagnosis not present

## 2017-06-22 DIAGNOSIS — I96 Gangrene, not elsewhere classified: Secondary | ICD-10-CM | POA: Diagnosis not present

## 2017-06-22 NOTE — Telephone Encounter (Signed)
-----   Message from Mena Goes, RN sent at 06/19/2017  5:16 PM EST ----- Regarding: 4 weeks for staple removal post BKA   ----- Message ----- From: Ulyses Amor, PA-C Sent: 06/19/2017   4:30 PM To: Vvs Charge Pool  Left BKA Dr. Oneida Alar in 4 weeks

## 2017-06-22 NOTE — Telephone Encounter (Signed)
Sched appt 07/23/17 at 10:45. Spoke to pt's grandson [name] and Lennette Bihari with [facility] [employer] to inform them of appt. Pt's grandson will be with pt at appt.

## 2017-06-24 DIAGNOSIS — Z89519 Acquired absence of unspecified leg below knee: Secondary | ICD-10-CM | POA: Diagnosis not present

## 2017-06-24 DIAGNOSIS — I739 Peripheral vascular disease, unspecified: Secondary | ICD-10-CM | POA: Diagnosis not present

## 2017-06-24 DIAGNOSIS — I10 Essential (primary) hypertension: Secondary | ICD-10-CM | POA: Diagnosis not present

## 2017-06-29 DIAGNOSIS — I7389 Other specified peripheral vascular diseases: Secondary | ICD-10-CM | POA: Diagnosis not present

## 2017-06-29 DIAGNOSIS — I10 Essential (primary) hypertension: Secondary | ICD-10-CM | POA: Diagnosis not present

## 2017-06-29 DIAGNOSIS — I96 Gangrene, not elsewhere classified: Secondary | ICD-10-CM | POA: Diagnosis not present

## 2017-06-29 DIAGNOSIS — L03115 Cellulitis of right lower limb: Secondary | ICD-10-CM | POA: Diagnosis not present

## 2017-07-06 DIAGNOSIS — E440 Moderate protein-calorie malnutrition: Secondary | ICD-10-CM | POA: Diagnosis not present

## 2017-07-06 DIAGNOSIS — I10 Essential (primary) hypertension: Secondary | ICD-10-CM | POA: Diagnosis not present

## 2017-07-06 DIAGNOSIS — I96 Gangrene, not elsewhere classified: Secondary | ICD-10-CM | POA: Diagnosis not present

## 2017-07-06 DIAGNOSIS — I7389 Other specified peripheral vascular diseases: Secondary | ICD-10-CM | POA: Diagnosis not present

## 2017-07-14 ENCOUNTER — Other Ambulatory Visit: Payer: Self-pay | Admitting: *Deleted

## 2017-07-14 NOTE — Patient Outreach (Signed)
Notified patient discharged from the facility, the original plan had been to remain LTC. Patient discharged 07/13/17 Call to patient, HIPAA verified. Spoke with patient and patient grandson, Fawnda Vitullo, with patient permission.  Patient lives alone, she is having issues with transfers, they are [facility], no services have started as of yet.  Grandson concerned about patient as they wanted her to stay in facility longer. He has filed appeal.  [name] reports he is available to patient but is not living in the home, he lives next door and patient calls him.  Patient home is handicapped accessible as she had her first amputation about 3 years ago. Patient does have a hospital bed and w/c.    RNCM reviewed [facility] care mangement, grandson has brochure and reviewed while on phone. They agree to services.  They report they can benefit from as many services as possible.  Plan to refer to [place] and to LCSW for community resources-home care providers  [facility] to [place] [facility] to follow up on referral, she states that she will follow up with grandson on referral, they should be hearing from Home care soon.   Royetta Crochet. Laymond Purser, RN, BSN, [place] [phone]) Business Cell  [phone]) Toll Free Office

## 2017-07-15 ENCOUNTER — Encounter: Payer: Self-pay | Admitting: *Deleted

## 2017-07-15 ENCOUNTER — Other Ambulatory Visit: Payer: Self-pay | Admitting: *Deleted

## 2017-07-15 NOTE — Patient Outreach (Signed)
[place] [facility]) Care Management [place] Telephone Outreach, Transition of Care day 1  07/15/2017  [name] April 23, 1932 [id number]  Successful telephone outreach to Emogene Morgan, grandson/ sole caregiver/ HCPOA, of [name], 84 y/o female referred to [place] for transition of care after recent hospitalization February 4-8, 2019 for (R) BKA due to gangrene/ PAD/ PVD; patient was discharged to SNF for rehabilitation, and was subsequently discharged home from [facility] on July 13, 2017 with home health services.  Patient has history including, but not limited to, PAD/ PVD with previous (L) BKA; HTN, and osteoporosis.  HIPAA/ identity verified with patient and with grandson today during 35 minute phone call.  [employer] services discussed with patient and caregiver with phone on speaker mode; patient provides verbal consent today for me to speak with [name] "any time," and both provide verbal consent for [place] CM involvement in patient's care.  Importance of maintaining contact with [place] RN CM discussed with patient and with caregiver; bot verbalize understanding and agreement.  Today, patient/ caregiver report that patient is "doing pretty good," and patient denies pain, and caregiver denies that patient is in distress; patient sounds to be in no obvious distress throughout today's phone call.  Patient/ caregiver further report:  Medications: -- Has all medicationsand takes as prescribed;denies questions about current medications.  -- Verbalizes good general understanding of the purpose, dosing, and scheduling of medications.   -- self-manage medications; patient takes directly out of pill Rx bottle  -- denies issues with swallowing medications -- patient was recently discharged from the hospital/ SNF and all medications were thoroughly reviewed with patient and caregiver today  Home health [facility]) services: -- [place] services for PT, OT,  RN in place through [facility] -- caregiver states he has not yet heard from home health staff; states he was expecting call sooner than now; confirmed that caregiver has the phone number for [facility] agency, and encouraged him to call to inquire about initiation of services; discussed with caregiver that I would also place care coordination outreach to agency tomorrow morning, as [place] office has closed for the afternoon at the time of our conversation today -- discussed home health disciplines with patient and caregiver: encouraged both to ensure that both PT and OT are active in patient's post-discharge care at home  Provider appointments: -- All upcoming provider appointments were reviewed with patient today; caregiver will transport patient to PCP for post-SNF discharge office visit tomorrow and will transport to [facility] provider for post-surgical evaluation on July 23, 2017  Safety/ Mobility/ Falls: -- denies new/ recent falls post- SNF discharge -- assistive devices: has walker; using wheelchair primarily post-SNF discharge; reports has prosthetic for previous (remote) (L) BKA, which patient is used to and regularly wears -- general fall risks/ prevention education discussed with patient today  Holiday representative needs: -- currently denies [employer] needs, stating supportive family member [name]) that assist with daily care needs as indicated -- [name] provides transportation for patient to all provider appointments, errands, etc  Advanced Directive (AD) Planning:   --reports has exisisting AD in place for HCPOA, recently created; states that patient declined desire to create living will.    Patient/ caregiver deny further issues, concerns, or problems today.  I confirmed that patient has my direct phone number, the main [facility] CM office phone number, and the [facility] CM 24-hour nurse advice phone number should issues arise prior to next scheduled [place]  CM outreach by  phone next week.  Encouraged patient to contact me directly if needs, questions, issues, or concerns arise prior to next scheduled outreach; patient agreed to do so.  Plan:  Patient will take medications as prescribed and will attend all scheduled provider appointment post-SNF discharge  Patient will actively participate in home health services once services are initiated  Patient will use assistive devices for ambulation/ mobility/ fall prevention  I will make patient's PCP aware of [facility] CM involvement in patient's care post- SNF discharge (will send barrier letter)  I will contact [facility] agency to inquire about status of initiation of services and to clarify which disciplines are involved  [employer] outreach for ongoing transition of care to continue with scheduled telephone call next week.   [facility] CM Care Plan Problem One     Most Recent Value  Care Plan Problem One  Risk for hospital readmission, related to/ as evidenced by recent hospitalization and SNF visit for (R) BKA  Role Documenting the Problem One  Care Management [place] for Problem One  Active  [facility] Long Term Goal   Over the next 31 days, patient will not experience hospital readmission, as evidenced by patient/ caregiver reporting and review of EMR during [facility] RN CCM outreach  [place] Term Goal Start Date  07/15/17  Interventions for Problem One Long Term Goal  Discussed with patient and caregiver recent discharge instructions post- SNF discharge,  medication review completed by phone,  reviewed upcoming provider appointments and confirmed that patient has transportation to upcoming appointments  [facility] CM Short Term Goal #1   Over the next 30 days, patient will actively participate in home health services as ordered post-SNF discharge, as evidenced by patient/ caregiver reporting and collaboration with home health team as indicated, during [facility] RN CCM outreach  [facility] CM Short Term Goal #1  Start Date  07/15/17  Interventions for Short Term Goal #1  Discussed with patient and caregiver home health services ordered thus far,  confirmed that patient has phone number for agency,  encouraged patient's full participation in home health services as ordered post- SNF discharge     I appreciate the opportunity to participate in [place]'s care,   Oneta Rack, RN, BSN, [employer] Coordinator [facility] Care Management  [phone]

## 2017-07-15 NOTE — Progress Notes (Addendum)
[place] at [facility] [address] [place], [name] [address] [phone]  Date:  07/16/2017   Name:  [name]   DOB:  07/15/1931   MRN:  [id number]  PCP:  [name], MD    Chief Complaint: Follow-up (Pt here for nursing home follow up visit. )   History of Present Illness:  [name] is a 84 y.o. very pleasant female patient who presents with the following:  She underwent a left BKA in 2016 due to severe PAD.  Her RIGHT LE was just amputated as well in February.   She arrived back home from the SNF this past Monday- She is much happier now that she is back home.  [name] noted that she was not herself when she was in SNR Since her son passed away her grand-son [name] has been instrumental in helping her.  He lives in a property just 100 yards from her home  [name] is coming out to her home tomorrow- they are not sure exactly what services they will provide.  She needs PT/OT/help with bathing Also likely needs a power scooter, and a re-do on her home bathroom [name] is not interested in going to assisted living- she wants to stay in her home and [name] feels like this is reasonable for her  They feel like her right stump is doing very well- she has no pain, is not taking any pain mediations.  She has a longer stump on the right than on the left and is very pleased with her result  No fever or chills She is feeling quite well  Patient Active Problem List   Diagnosis Date Noted  . Ischemia of right lower extremity 06/19/2017  . Malnutrition of moderate degree 06/16/2017  . Ischemic foot 06/15/2017  . Cellulitis 06/15/2017  . PAD (peripheral artery disease) ([place])   . Gangrene of right foot ([place])   . Osteoporosis 05-06-2017  . Loss or death of child 06-23-15  . Status post below knee amputation of left lower extremity ([place]) 01/31/2015  . Non-healing wound of lower extremity 01/29/2015  . Ulcer of  toe of left foot ([place]) 12/27/2014  . Essential hypertension 08/22/2014    Past Medical History:  Diagnosis Date  . Arthritis   . Cancer ([place])    utertrine  . Hypertension   . Osteoporosis 06-May-2017  . PAD (peripheral artery disease) ([place])     Past Surgical History:  Procedure Laterality Date  . ABDOMINAL HYSTERECTOMY     partial  . AMPUTATION Left 01/29/2015   Procedure: Left  AMPUTATION BELOW KNEE;  Surgeon: Elam Dutch, MD;  Location: [place];  Service: Vascular;  Laterality: Left;  . AMPUTATION Right 06/17/2017   Procedure: Right  BELOW KNEE Amputation;  Surgeon: Elam Dutch, MD;  Location: [place];  Service: Vascular;  Laterality: Right;  . KNEE SURGERY Right 1984  . PERIPHERAL VASCULAR CATHETERIZATION N/A 09/15/2014   Procedure: Abdominal Aortogram;  Surgeon: Elam Dutch, MD;  Location: [facility] INVASIVE CV LAB CUPID;  Service: Cardiovascular;  Laterality: N/A;  . PERIPHERAL VASCULAR CATHETERIZATION Bilateral 09/15/2014   Procedure: Lower Extremity Angiography;  Surgeon: Elam Dutch, MD;  Location: [place] INVASIVE CV LAB CUPID;  Service: Cardiovascular;  Laterality: Bilateral;  . SPINE SURGERY      Social History   Tobacco Use  . Smoking status: Never Smoker  . Smokeless tobacco: Never Used  Substance Use Topics  .  Alcohol use: No    Alcohol/week: 0.0 oz  . Drug use: No    Family History  Problem Relation Age of Onset  . Diabetes Mother   . Heart disease Mother   . Dementia Sister   . Diabetes Son     No Known Allergies  Medication list has been reviewed and updated.  Current Outpatient Medications on File Prior to Visit  Medication Sig Dispense Refill  . acetaminophen (TYLENOL) 325 MG tablet Take 1-2 tablets (325-650 mg total) by mouth every 4 (four) hours as needed for mild pain (or temp >/= 101 F).    . clopidogrel (PLAVIX) 75 MG tablet Take 1 tablet (75 mg total) by mouth daily. 90 tablet 3  . ibuprofen (ADVIL,MOTRIN) 200 MG tablet Take 600 mg by  mouth every 6 (six) hours as needed (For pain.).    Marland Kitchen lisinopril (PRINIVIL,ZESTRIL) 10 MG tablet Take 1 tablet (10 mg total) by mouth daily.    . Multiple Vitamin (MULTIVITAMIN WITH MINERALS) TABS tablet Take 1 tablet by mouth daily.    Marland Kitchen omeprazole (PRILOSEC) 20 MG capsule Take 20 mg by mouth daily.    Vladimir Faster Glycol-Propyl Glycol (SYSTANE FREE OP) Place 1 drop into both eyes daily. For dry eyes     No current facility-administered medications on file prior to visit.     Review of Systems:  As per HPI- otherwise negative.   Physical Examination: Vitals:   07/16/17 1146  BP: 140/82  Pulse: 65  Temp: 98 F (36.7 C)  SpO2: 99%   Vitals:   There is no height or weight on file to calculate BMI. Ideal Body Weight:    GEN: WDWN, NAD, Non-toxic, A & O x 3, looks welk HEENT: Atraumatic, Normocephalic. Neck supple. No masses, No LAD. Ears and Nose: No external deformity. CV: RRR, No M/G/R. No JVD. No thrill. No extra heart sounds. PULM: CTA B, no wheezes, crackles, rhonchi. No retractions. No resp. distress. No accessory muscle use. EXTR: No c/c/e of hands Wearing her left BKA prosthesis Right stump looks great, still have staples in.  Healing very well, no tenderness or sign of infection NEURO in WC today PSYCH: Normally interactive. Conversant. Not depressed or anxious appearing.  Calm demeanor.    Assessment and Plan: Post-operative state - Plan: Basic metabolic panel, CBC  S/P BKA (below knee amputation), right ([place])  Mobility impaired  Will check labs for her today- needs post op labs Discussed her situation in detail and offered support.  She no longer has any pain from her PVD which is good, but getting around is an added challenge. She is getting home support set up tomorrow with PT and OT, but asked them to let me know if I can help Gave [employer] bus application Discussed scooters, re-bath addnd 5/9;  Pt is getting her prosthetic care from [facility]  She  needs a replacement socket due to wear of her old socket and to accommodate changes in her stump   Signed Lamar Blinks, MD  addnd 4/16- her labs did not come back, I don't think she had her blood drawn. However she is scheduled to see me in about one month anyway, will get labs then instead of asking her to come back in now

## 2017-07-16 ENCOUNTER — Other Ambulatory Visit: Payer: Self-pay | Admitting: *Deleted

## 2017-07-16 ENCOUNTER — Encounter: Payer: Self-pay | Admitting: *Deleted

## 2017-07-16 ENCOUNTER — Ambulatory Visit (INDEPENDENT_AMBULATORY_CARE_PROVIDER_SITE_OTHER): Payer: Medicare Other | Admitting: Family Medicine

## 2017-07-16 VITALS — BP 140/82 | HR 65 | Temp 98.0°F

## 2017-07-16 DIAGNOSIS — Z9889 Other specified postprocedural states: Secondary | ICD-10-CM

## 2017-07-16 DIAGNOSIS — Z7409 Other reduced mobility: Secondary | ICD-10-CM

## 2017-07-16 DIAGNOSIS — Z89511 Acquired absence of right leg below knee: Secondary | ICD-10-CM | POA: Diagnosis not present

## 2017-07-16 NOTE — Patient Outreach (Signed)
[facility]) Care Management [place] Telephone [facility] Coordination  07/16/2017  [name] [birth year] [id number]   08:10:  Successful telephone outreach to [name] with [facility] [phone]) re: [name], 84 y/o female referred to [place] for transition of care after recent hospitalization February 4-8, 2019 for (R) BKA due to gangrene/ PAD/ PVD; patient was discharged to SNF for rehabilitation, and was subsequently discharged home from [facility] on July 13, 2017 with home health services.  Patient has history including, but not limited to, PAD/ PVD with previous (L) BKA; HTN, and osteoporosis.   Call placed today to inquire about status of home health services; [name] confirmed that patient was due for initiation of services tomorrow, Friday July 17, 2016; [name] further confirmed that patient has services ordered for nursing, PT, and OT.  [name] shared that home health staff should be contacting patient's caregiver today to schedule appointment for Friday 07/17/17.  08:25 am:  [place], grandson/ sole caregiver/ HCPOA, HIPAA/ identity verified with caregiver and I shared the above information with him and confirmed that he had the contact information for home health agency.  [name] was appreciative of this information/ update.  Plan:  Will collaborate as indicated with home health team  [place] outreach for transition of care to continue with scheduled phone call next week.  Gail Rack, RN, BSN, [employer] [facility] Care Management  [phone]

## 2017-07-16 NOTE — Patient Instructions (Addendum)
Please let me know if [facility] if not able to do PT/OT/bathing- if need be I can help arrange other services My cell is [phone]; call me if needed, or use my chart   Re-bath may be a good option to remodel your bathroom A power scooter may be a good option for you as well.   I will look for a note from Dr. Oneida Alar  I will be in touch with your labs asap

## 2017-07-17 ENCOUNTER — Ambulatory Visit: Payer: Medicare Other | Admitting: *Deleted

## 2017-07-17 ENCOUNTER — Telehealth: Payer: Self-pay | Admitting: Family Medicine

## 2017-07-17 DIAGNOSIS — I739 Peripheral vascular disease, unspecified: Secondary | ICD-10-CM | POA: Diagnosis not present

## 2017-07-17 DIAGNOSIS — E440 Moderate protein-calorie malnutrition: Secondary | ICD-10-CM | POA: Diagnosis not present

## 2017-07-17 DIAGNOSIS — K219 Gastro-esophageal reflux disease without esophagitis: Secondary | ICD-10-CM | POA: Diagnosis not present

## 2017-07-17 DIAGNOSIS — Z89512 Acquired absence of left leg below knee: Secondary | ICD-10-CM | POA: Diagnosis not present

## 2017-07-17 DIAGNOSIS — Z89611 Acquired absence of right leg above knee: Secondary | ICD-10-CM | POA: Diagnosis not present

## 2017-07-17 DIAGNOSIS — Z7902 Long term (current) use of antithrombotics/antiplatelets: Secondary | ICD-10-CM | POA: Diagnosis not present

## 2017-07-17 DIAGNOSIS — Z993 Dependence on wheelchair: Secondary | ICD-10-CM | POA: Diagnosis not present

## 2017-07-17 DIAGNOSIS — Z9181 History of falling: Secondary | ICD-10-CM | POA: Diagnosis not present

## 2017-07-17 DIAGNOSIS — M810 Age-related osteoporosis without current pathological fracture: Secondary | ICD-10-CM | POA: Diagnosis not present

## 2017-07-17 DIAGNOSIS — I10 Essential (primary) hypertension: Secondary | ICD-10-CM | POA: Diagnosis not present

## 2017-07-17 DIAGNOSIS — Z4781 Encounter for orthopedic aftercare following surgical amputation: Secondary | ICD-10-CM | POA: Diagnosis not present

## 2017-07-17 NOTE — Telephone Encounter (Signed)
Returned call to [employer]. Authorization for verbal orders from previous telephone note given.

## 2017-07-17 NOTE — Telephone Encounter (Signed)
Copied from [place]. Topic: Quick Communication - See Telephone Encounter >> Jul 17, 2017  1:13 PM Lolita Rieger, RMA wrote: CRM for notification. See Telephone encounter for:   07/17/17.[place] from [facility] needs verbals for nursing once a week for 5 weeks HHA twice a week for 2 weeks and  pt ot eval. [phone] if orders can be given today if not then call  [place] [phone]

## 2017-07-18 DIAGNOSIS — Z4781 Encounter for orthopedic aftercare following surgical amputation: Secondary | ICD-10-CM | POA: Diagnosis not present

## 2017-07-18 DIAGNOSIS — K219 Gastro-esophageal reflux disease without esophagitis: Secondary | ICD-10-CM | POA: Diagnosis not present

## 2017-07-18 DIAGNOSIS — I10 Essential (primary) hypertension: Secondary | ICD-10-CM | POA: Diagnosis not present

## 2017-07-18 DIAGNOSIS — I739 Peripheral vascular disease, unspecified: Secondary | ICD-10-CM | POA: Diagnosis not present

## 2017-07-18 DIAGNOSIS — M810 Age-related osteoporosis without current pathological fracture: Secondary | ICD-10-CM | POA: Diagnosis not present

## 2017-07-18 DIAGNOSIS — E440 Moderate protein-calorie malnutrition: Secondary | ICD-10-CM | POA: Diagnosis not present

## 2017-07-20 ENCOUNTER — Telehealth: Payer: Self-pay

## 2017-07-20 DIAGNOSIS — I739 Peripheral vascular disease, unspecified: Secondary | ICD-10-CM | POA: Diagnosis not present

## 2017-07-20 DIAGNOSIS — E440 Moderate protein-calorie malnutrition: Secondary | ICD-10-CM | POA: Diagnosis not present

## 2017-07-20 DIAGNOSIS — K219 Gastro-esophageal reflux disease without esophagitis: Secondary | ICD-10-CM | POA: Diagnosis not present

## 2017-07-20 DIAGNOSIS — I10 Essential (primary) hypertension: Secondary | ICD-10-CM | POA: Diagnosis not present

## 2017-07-20 DIAGNOSIS — M810 Age-related osteoporosis without current pathological fracture: Secondary | ICD-10-CM | POA: Diagnosis not present

## 2017-07-20 DIAGNOSIS — Z4781 Encounter for orthopedic aftercare following surgical amputation: Secondary | ICD-10-CM | POA: Diagnosis not present

## 2017-07-20 NOTE — Telephone Encounter (Signed)
Returned call from [facility]. Verbal orders given for PT 1x/wk 1 wk and 2x/wk for 4 wks as requested in previous message.

## 2017-07-20 NOTE — Telephone Encounter (Signed)
Copied from [place]. Topic: Inquiry >> Jul 20, 2017 11:26 AM Oliver Pila B wrote: Reason for CRM: [facility] home health called to get PT orders for 1x/wk 1wk and 2x/wk for 4wk, contact [phone] ok to leave VM

## 2017-07-21 DIAGNOSIS — E440 Moderate protein-calorie malnutrition: Secondary | ICD-10-CM | POA: Diagnosis not present

## 2017-07-21 DIAGNOSIS — K219 Gastro-esophageal reflux disease without esophagitis: Secondary | ICD-10-CM | POA: Diagnosis not present

## 2017-07-21 DIAGNOSIS — M810 Age-related osteoporosis without current pathological fracture: Secondary | ICD-10-CM | POA: Diagnosis not present

## 2017-07-21 DIAGNOSIS — I10 Essential (primary) hypertension: Secondary | ICD-10-CM | POA: Diagnosis not present

## 2017-07-21 DIAGNOSIS — I739 Peripheral vascular disease, unspecified: Secondary | ICD-10-CM | POA: Diagnosis not present

## 2017-07-21 DIAGNOSIS — Z4781 Encounter for orthopedic aftercare following surgical amputation: Secondary | ICD-10-CM | POA: Diagnosis not present

## 2017-07-23 ENCOUNTER — Encounter: Payer: Self-pay | Admitting: Vascular Surgery

## 2017-07-23 ENCOUNTER — Encounter: Payer: Self-pay | Admitting: *Deleted

## 2017-07-23 ENCOUNTER — Other Ambulatory Visit: Payer: Self-pay | Admitting: *Deleted

## 2017-07-23 ENCOUNTER — Ambulatory Visit (INDEPENDENT_AMBULATORY_CARE_PROVIDER_SITE_OTHER): Payer: Self-pay | Admitting: Vascular Surgery

## 2017-07-23 ENCOUNTER — Other Ambulatory Visit: Payer: Self-pay

## 2017-07-23 VITALS — BP 144/76 | HR 68 | Temp 97.1°F | Resp 18 | Ht 67.0 in | Wt 130.0 lb

## 2017-07-23 DIAGNOSIS — I739 Peripheral vascular disease, unspecified: Secondary | ICD-10-CM

## 2017-07-23 DIAGNOSIS — K219 Gastro-esophageal reflux disease without esophagitis: Secondary | ICD-10-CM | POA: Diagnosis not present

## 2017-07-23 DIAGNOSIS — M810 Age-related osteoporosis without current pathological fracture: Secondary | ICD-10-CM | POA: Diagnosis not present

## 2017-07-23 DIAGNOSIS — I10 Essential (primary) hypertension: Secondary | ICD-10-CM | POA: Diagnosis not present

## 2017-07-23 DIAGNOSIS — Z4781 Encounter for orthopedic aftercare following surgical amputation: Secondary | ICD-10-CM | POA: Diagnosis not present

## 2017-07-23 DIAGNOSIS — E440 Moderate protein-calorie malnutrition: Secondary | ICD-10-CM | POA: Diagnosis not present

## 2017-07-23 NOTE — Patient Outreach (Signed)
[facility]) Care Management [place] Telephone Outreach, Transition of Care day 9  07/23/2017  [name] 05-Jan-1932 [id number]  Successful telephone outreach to Emogene Morgan, grandson/ sole caregiver/ HCPOA, of [name], 84 y/o female referred to [place] for transition of care after recent hospitalization February 4-8, 2019 for (R) BKA due to gangrene/ PAD/ PVD; patient was discharged to SNF for rehabilitation, and was subsequently discharged home from [facility] on July 13, 2017 with home health services.  Patient has history including, but not limited to, PAD/ PVD with previous (L) BKA; HTN, and osteoporosis.    12:45 pm:  Received automated outgoing voice mail message stating, "I'm sorry the person you are trying to reach has a voicemail box that has not been set up yet;" unable to leave patient's caregiver voice message requesting call back  4:55 pm:  received return voice mail message form patient's caregiver, requesting call back; call-back completed; HIPAA/ identity verified with caregiver during phone call today.  Today, patient's caregiver reports that patient is "doing real good," and that patient denies pain, discomfort, or concerns.  Caregiver reports that patient is in no distress, has had no new/ recent falls, and states that "everything is going really good."   Caregiver further report:  Medications: -- Has all medicationsand takes as prescribed;denies questions about current medications, denies recent changes as a result of this week's provider appointments.  Home health [facility]) services: -- [place] services for PT, OT, RN in place through [facility] agency; confirms that home health services have been initiated and are actively in progress; reports that patient is actively participating with all ordered disciplines -- reports that patient has been independently performing home health PT exercises every day as  instructed  Provider appointments: -- Attended recent PCP and vascular surgeon appointments this week; reports "got a good report from both;" reports that patient was told by vascular surgeon that her new prosthetic should be ready within the next 3 weeks; reports patient currently wearing "ampu-guard," and doing well  Safety/ Mobility/ Falls: -- denies new/ recent falls post- SNF discharge -- assistive devices: has walker; continues using wheelchair primarily post-SNF discharge; reports has prosthetic for previous (remote) (L) BKA, which patient is used to and regularly wears -- general fall risks/ prevention education reiterated with caregiver today  Caregiver deny further issues, concerns, or problems today. I confirmed that patient hasmy direct phone number, the main [facility] CM office phone number, and the [facility] CM 24-hour nurse advice phone number should issues arise prior to next scheduled [place] outreach with intial home visit scheduled today fornext week.  Encouraged patient/ caregiver to contact me directly if needs, questions, issues, or concerns arise prior to next scheduled outreach; caregiver agreed to do so.  Plan:  Patient will take medications as prescribed and will attend all scheduled provider appointment post-SNF discharge  Patient will actively participate in home health services once services are initiated  Patient will use assistive devices for ambulation/ mobility/ fall prevention  [place] outreach for ongoing transition of care to continue with scheduled initial home visit next week.  [facility] CM Care Plan Problem One     Most Recent Value  Care Plan Problem One  Risk for hospital readmission, related to/ as evidenced by recent hospitalization and SNF visit for (R) BKA  Role Documenting the Problem One  Care Management [place] for Problem One  Active  [facility] Long Term Goal  Over the next 31 days, patient will not experience hospital  readmission, as evidenced by patient/ caregiver reporting and review of EMR during [place] outreach  [place] Term Goal Start Date  07/15/17  Interventions for Problem One Long Term Goal  Discussed with patient's caregiver patient's current clinical status and recently attended provider appointments,  scheduled [facility] RN CCM initial home visit for next week   [facility] CM Short Term Goal #1   Over the next 30 days, patient will actively participate in home health services as ordered post-SNF discharge, as evidenced by patient/ caregiver reporting and collaboration with home health team as indicated, during [facility] RN CCM outreach  [facility] CM Short Term Goal #1 Start Date  07/15/17  Interventions for Short Term Goal #1  Discussed with patient's caregiver home health services ordered post-hospital discharge,  confirmed that home health services have arrived and that caregiver has phone number for agency,  confirmed with caregiver that patient is actively participating in home health services as ordered post- SNF discharge     Oneta Rack, RN, BSN, [place] Care Management  [phone]

## 2017-07-23 NOTE — Progress Notes (Signed)
Patient is an 84 year old female who returns today for postoperative follow-up.  She had a right below-knee amputation 36 days ago.  She reports no incisional drainage.  She has minimal pain.  She is undergoing rehab.  Physical exam:  Vitals:   07/23/17 1053  BP: (!) 144/76  Pulse: 68  Resp: 18  Temp: (!) 97.1 F (36.2 C)  TempSrc: Oral  SpO2: 97%  Weight: 130 lb (59 kg)  Height: 5\' 7"  (1.702 m)    Extremities: Well-healed right below-knee amputation.  Some erythema which appears to be from staple irritation.  No drainage.  Wound edges are well approximated.  No hematoma.  Assessment: Doing well status post right below-knee amputation.  Plan: Remove staples today.  Patient already has a Facilities manager.  She will undergo evaluation soon for prosthetic limb.  He will follow-up with me on as-needed basis.  [name], MD [facility] [place] Office: [phone] Pager: [phone]

## 2017-07-24 DIAGNOSIS — Z4781 Encounter for orthopedic aftercare following surgical amputation: Secondary | ICD-10-CM | POA: Diagnosis not present

## 2017-07-24 DIAGNOSIS — I739 Peripheral vascular disease, unspecified: Secondary | ICD-10-CM | POA: Diagnosis not present

## 2017-07-24 DIAGNOSIS — E440 Moderate protein-calorie malnutrition: Secondary | ICD-10-CM | POA: Diagnosis not present

## 2017-07-24 DIAGNOSIS — M810 Age-related osteoporosis without current pathological fracture: Secondary | ICD-10-CM | POA: Diagnosis not present

## 2017-07-24 DIAGNOSIS — K219 Gastro-esophageal reflux disease without esophagitis: Secondary | ICD-10-CM | POA: Diagnosis not present

## 2017-07-24 DIAGNOSIS — I10 Essential (primary) hypertension: Secondary | ICD-10-CM | POA: Diagnosis not present

## 2017-07-27 ENCOUNTER — Telehealth: Payer: Self-pay | Admitting: *Deleted

## 2017-07-27 NOTE — Telephone Encounter (Signed)
Received Physician Orders for DME from [place]; forwarded to provider/SLS 03/18

## 2017-07-30 ENCOUNTER — Other Ambulatory Visit: Payer: Self-pay | Admitting: *Deleted

## 2017-07-30 ENCOUNTER — Encounter: Payer: Self-pay | Admitting: *Deleted

## 2017-07-30 DIAGNOSIS — I10 Essential (primary) hypertension: Secondary | ICD-10-CM | POA: Diagnosis not present

## 2017-07-30 DIAGNOSIS — K219 Gastro-esophageal reflux disease without esophagitis: Secondary | ICD-10-CM | POA: Diagnosis not present

## 2017-07-30 DIAGNOSIS — M810 Age-related osteoporosis without current pathological fracture: Secondary | ICD-10-CM | POA: Diagnosis not present

## 2017-07-30 DIAGNOSIS — I739 Peripheral vascular disease, unspecified: Secondary | ICD-10-CM | POA: Diagnosis not present

## 2017-07-30 DIAGNOSIS — E440 Moderate protein-calorie malnutrition: Secondary | ICD-10-CM | POA: Diagnosis not present

## 2017-07-30 DIAGNOSIS — Z4781 Encounter for orthopedic aftercare following surgical amputation: Secondary | ICD-10-CM | POA: Diagnosis not present

## 2017-07-30 NOTE — Patient Outreach (Addendum)
[facility]) Care Management  [place] Initial Home Visit, Transition of Care day 16  07/30/2017  [name] Apr 18, 1932 [id number]  [name] is a 84 y.o. 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 HIPAA/ identity verified with patient today, and patient provides both verbal and written consent for [facility] CM involvement in her care.  Patient's grandson/ caregiver/ HCPOA [name] is also present for home visit today and fully/ actively participates in entirety of home visit.  Pleasant 75 minute initial home visit.  Today, patient reports that she "is doing great" and she denies pain, concerns, recent falls, or other problems/ issues.  Patient is in no distress throughout entirety of home visit.  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  [name] has a very supportive family that assist with her care needs as indicated on a daily basis, and whom provide transportation to all provider appointments.  [name] is very motivated to obtain her new prosthetic limb and to start outpatient PT, so that she can ambulate again with the use of both her prosthetics.  [name] is  adherent to her overall plan of care and is very motivated toward meeting her established goals post- recent surgery for (R) BKA.  Patient/ caregiver further report:  Medications: -- Has all medicationsand takes as prescribed;denies questions/ concerns about current medications, denies recent changes to medications -- independently takes medications directly from prescription bottles; does not require assistance with medication  -- denies issues swallowing medications -- Patient was recently discharged from hospital and all medications were thoroughly reviewed with patient in person at her home today   Home health [facility]) services: -- [place] [facility] for [facility], OT, [facility]; patient reports [facility] RN "visited earlier this morning," and "gave [place] a great report, said she couldn't believe how well my incision was looking-- said you couldn't even see the incision anymore because it has healed up so well." -- [facility] PT/ OT have now discharged patient, as she has been able to perform previously prescribed exercises independently; "I've been doing so well, they don't need to come any more." -- reports approximately 1-2 weeks additional visits expected form [facility] RN  Provider appointments: -- Attended all recent provider appointments post-hospital discharge; reports vascular surgeon has "released" her, and she doesn't need additional office visits unless she has concerns in the future -- reports to attend scheduled appointment for fitting of new (R) BKA prosthetic on August 13, 2017; [name] to provide transportation; patient is very excited about this appointment and states that she "can't wait" to have this appointment completed, so she can "start walking again." -- discussed with patient and grandson process to take  to ensure that patient has outpatient PT scheduled once she obtains new prosthetic  Safety/ Mobility/ Falls: -- denies new/ recent fallspost- SNF discharge --  assistive devices:has walker; continues using wheelchair primarily post-SNF discharge; patient is able to independently navigate wheelchair throughout her home without difficulty -- wears previously obtained (L) BKA prosthetic "all the time," and patient is wearing today  -- general fall risks/ prevention education reiterated with caregiver today  Using teach-back method, provided and reviewed with patient printed EMMI educational material around exercises post- BKA and preventative health care for older adults.  Patientdeniesfurther issues, concerns, or problems today. I confirmed that patient hasmy direct phone number, the main [facility] CM office phone number, and the [facility] CM 24-hour nurse advice phone number should issues arise prior to next scheduled [place] scheduled phone call next week. Encouraged patient/ caregiver to contact me directly if needs, questions, issues, or concerns arise prior to next scheduled outreach; caregiver agreed to do so.  Objective:    BP (!) 158/68   Pulse 74   Resp 18   SpO2 98%   Review of Systems  Constitutional: Negative.  Negative for malaise/fatigue.  Respiratory: Negative.  Negative for cough, shortness of breath and wheezing.   Cardiovascular: Negative for chest pain and leg swelling.  Gastrointestinal: Negative.  Negative for abdominal pain and nausea.  Genitourinary: Negative.  Negative for frequency and urgency.  Musculoskeletal: Negative.  Negative for falls, joint pain and myalgias.  Skin:       Patient has areas of old/ new bruising noted to bilateral UE; reports these are from "bumping into doorways when in wheelchair"  Neurological: Negative.   Psychiatric/Behavioral: Negative.  Negative for depression. The patient is not nervous/anxious.    Physical Exam  Constitutional: She is oriented to person, place, and time. She appears well-developed and well-nourished. No distress.    Cardiovascular: Normal rate, regular  rhythm, normal heart sounds and intact distal pulses.  Pulses:      Radial pulses are 2+ on the right side, and 2+ on the left side.  Respiratory: Effort normal and breath sounds normal. No respiratory distress. She has no wheezes. She has no rales.  GI: Soft. Bowel sounds are normal.  Musculoskeletal: She exhibits no edema.  Neurological: She is alert and oriented to person, place, and time.  Skin: Skin is warm and dry. No erythema.  Psychiatric: She has a normal mood and affect. Her behavior is normal. Judgment and thought content normal.   Encounter Medications:   Outpatient Encounter Medications as of 07/30/2017  Medication Sig Note  . acetaminophen (TYLENOL) 325 MG tablet Take 1-2 tablets (325-650 mg total) by mouth every 4 (four) hours as needed for mild pain (or temp >/= 101 F). 07/30/2017: Has not needed recently; takes rarely  . clopidogrel (PLAVIX) 75 MG tablet Take 1 tablet (75 mg total) by mouth daily.   Marland Kitchen ibuprofen (ADVIL,MOTRIN) 200 MG tablet Take 600 mg by mouth every 6 (six) hours as needed (For pain.). 07/30/2017: Has not needed recently; takes as needed   . lisinopril (PRINIVIL,ZESTRIL) 10 MG tablet Take 1 tablet (10 mg total) by mouth daily. 07/30/2017: Patient actually taking 20 mg po QD; Rx dated 03/25/17  . omeprazole (PRILOSEC) 20 MG capsule Take 20 mg by mouth daily. 07/15/2017: Takes OTC  . Polyethyl Glycol-Propyl Glycol (SYSTANE FREE OP) Place 1 drop into both eyes daily. For dry eyes   . Multiple Vitamin (MULTIVITAMIN WITH MINERALS) TABS tablet Take 1 tablet by  mouth daily. (Patient not taking: Reported on 07/30/2017)    No facility-administered encounter medications on file as of 07/30/2017.    Functional Status:   In your present state of health, do you have any difficulty performing the following activities: 07/30/2017 07/15/2017  Hearing? N Y  Comment per patient report today, she is not [place]? N -  Comment - -  Difficulty concentrating or making decisions? N  -  Walking or climbing stairs? Y Y  Dressing or bathing? N Y  Doing errands, shopping? [place]  Preparing Food and eating ? N -  Using the Toilet? N -  In the past six months, have you accidently leaked urine? N -  Do you have problems with loss of bowel control? N -  Managing your Medications? N -  Managing your Finances? N -  Housekeeping or managing your Housekeeping? N -  Some recent data might be hidden   Fall/Depression Screening:    Fall Risk  07/23/2017 04/01/2017 09/29/2016  Falls in the past year? (No Data) No No  Comment No new falls post-hospital discharge, per report of patient's grandson, whom patient provided verbal permission to speak with - -  Risk for fall due to : Impaired mobility;Other (Comment) - -  Risk for fall due to: Comment Bilateral BKA, new (R) BKA - -   PHQ 2/9 Scores 04/01/2017 09/29/2016 06/02/2015 03/13/2015 02/14/2015 11/16/2014 10/16/2014  PHQ - 2 Score 0 0 0 0 0 0 0   Plan:  Patient will take medications as prescribed and will attend all scheduled provider appointment post-SNF discharge  Patient will actively participate in home health services as ordered post-hospital discharge; patient will continue performing post- BKA exercises as recommended by home health PT  Patient will use assistive devices for ambulation/ mobility/ fall prevention  I will share notes/ care plan from today's [place] initial home visit with patient's PCP  [place] outreach for ongoing transition of care to continue with scheduled telephone call next week.  [facility] CM Care Plan Problem One     Most Recent Value  Care Plan Problem One  Risk for hospital readmission, related to/ as evidenced by recent hospitalization and SNF visit for (R) BKA  Role Documenting the Problem One  Care Management [place] for Problem One  Active  THN Long Term Goal   Over the next 31 days, patient will not experience hospital readmission, as evidenced by patient/ caregiver  reporting and review of EMR during [facility] RN CCM outreach  [place] Term Goal Start Date  07/15/17  Interventions for Problem One Long Term Goal  [facility] Community CM initial home visit and assessment completed,  in-home medication review and falls assessment completed  THN CM Short Term Goal #1   Over the next 30 days, patient will actively participate in home health services as ordered post-SNF discharge, as evidenced by patient/ caregiver reporting and collaboration with home health team as indicated, during [facility] RN CCM outreach  [facility] CM Short Term Goal #1 Start Date  07/15/17  Interventions for Short Term Goal #1  Reviewed with patient and caregiver home health services currently active,  patient's participation in [place] Problem Two     Most Recent Value  Care Plan Problem Two  Impaired mobility related to/ as evidenced by patient's recent (R) BKA, making patient a bilateral BKA  Role Documenting the Problem Two  Care Management Coordinator  Care Plan for Problem Two  Active  Interventions for Problem Two Long Term Goal   Using teachback method, provided and discussed with patient and caregiver printed educational material on post- BKA exercises,  discussed with patient her current exercise regimine and plans to explore possibility of outpatient PT once she obtains new prosthetic,  discussed upcoming appointment to have new prosthetic fitted  THN Long Term Goal  Over the next 50 days, patient will be able to use newly acquired prosthetic for ambulation, as evidenced by patient and caregiver reporting/ demonstration during [facility] RN CCM outreach  [place] Term Goal Start Date  07/30/17  [facility] CM Short Term Goal #1   Over the next 14 days, patient will attend scheduled appointment for new prosthetic fitting, as evidenced by patient/ caregiver reporting during [facility] RN CCM outreach  [facility] CM Short Term Goal #1 Start Date  07/30/17  Interventions for Short Term Goal #2   Discussed upcoming  scheduled appointment for prosthetic fitting with patient and caregiver and confirmed that caregiver will transport patient to appointment,  discussed process for having outpatient PT ordered once new prosthetic is obtained     Oneta Rack, RN, BSN, [place] Coordinator [facility] Care Management  [phone]

## 2017-08-04 ENCOUNTER — Other Ambulatory Visit: Payer: Self-pay | Admitting: *Deleted

## 2017-08-04 DIAGNOSIS — E440 Moderate protein-calorie malnutrition: Secondary | ICD-10-CM | POA: Diagnosis not present

## 2017-08-04 DIAGNOSIS — I739 Peripheral vascular disease, unspecified: Secondary | ICD-10-CM | POA: Diagnosis not present

## 2017-08-04 DIAGNOSIS — K219 Gastro-esophageal reflux disease without esophagitis: Secondary | ICD-10-CM | POA: Diagnosis not present

## 2017-08-04 DIAGNOSIS — I10 Essential (primary) hypertension: Secondary | ICD-10-CM | POA: Diagnosis not present

## 2017-08-04 DIAGNOSIS — Z4781 Encounter for orthopedic aftercare following surgical amputation: Secondary | ICD-10-CM | POA: Diagnosis not present

## 2017-08-04 DIAGNOSIS — M810 Age-related osteoporosis without current pathological fracture: Secondary | ICD-10-CM | POA: Diagnosis not present

## 2017-08-04 NOTE — Patient Outreach (Signed)
[place] [facility]) Care Management  08/04/2017  [name] 22-May-1931 [id number]   Patient ws referred to this social worker to assess for community resource needs following her discharge from skilled care. This social worker spoke with [name] on 08/04/17 who states that patient is doing well at home.  Per patient's grandson, HH has been discontinued and she continues to do her exercises on her own. Patient's family is described as supportive as they will continues to assist her with her care needs. Per [name], he assists her with as much as he can but he also allows her to do things independently.  [name] verbalized having no community resource needs at this time. This social worker's contact information provided if there are any future questions or community resource needs.    [name] [facility] Care Management [phone]

## 2017-08-06 ENCOUNTER — Encounter: Payer: Self-pay | Admitting: *Deleted

## 2017-08-06 ENCOUNTER — Other Ambulatory Visit: Payer: Self-pay | Admitting: *Deleted

## 2017-08-06 DIAGNOSIS — I10 Essential (primary) hypertension: Secondary | ICD-10-CM | POA: Diagnosis not present

## 2017-08-06 DIAGNOSIS — Z4781 Encounter for orthopedic aftercare following surgical amputation: Secondary | ICD-10-CM | POA: Diagnosis not present

## 2017-08-06 DIAGNOSIS — K219 Gastro-esophageal reflux disease without esophagitis: Secondary | ICD-10-CM | POA: Diagnosis not present

## 2017-08-06 DIAGNOSIS — I739 Peripheral vascular disease, unspecified: Secondary | ICD-10-CM | POA: Diagnosis not present

## 2017-08-06 DIAGNOSIS — M810 Age-related osteoporosis without current pathological fracture: Secondary | ICD-10-CM | POA: Diagnosis not present

## 2017-08-06 DIAGNOSIS — E440 Moderate protein-calorie malnutrition: Secondary | ICD-10-CM | POA: Diagnosis not present

## 2017-08-06 NOTE — Patient Outreach (Signed)
[place] [facility]) Care Management [place] Telephone Outreach, Transition of Care day 23  08/06/2017  AMBRY DIX 27-Aug-1931 [id number]  Successful telephone outreach to [name], 84 y.o. 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   Today, patient/ caregiver report that patient continues "doing great" and she denies pain, concerns, recent falls, or other problems/ issues.  Patient sounds to be in no distress throughout entirety of phone call.  Patient/ caregiver further report:  Medications: -- Has all medicationsand takes as prescribed;denies questions/ concerns about current medications, denies recent changes to medications   Home health [facility]) services: -- [place] [facility] for PT, OT, [facility]; patient reports [place] OT "visited earlier this morning," and reported that she has "at least 2 more weeks" to visit with patient.  Patient/ caregiver previously thought that PT/ OT "had signed off," and report today that these disciplines continue. -- HH PT has visited with patient this week, and provided additional exercises above what was previously taught to patient.  Caregiver reports that PT recommended that patient perform these exercises independently between PT sessions "three times a day, 20 times each, for a total of 60 a day."  Caregiver reports that patient is so excited to be doing so well, and so wants to stay strong, that she is  actually performing the exercises "165 times, twice a day, making her daily total of 330 reps."  Positive reinforcement provided for patient going above and beyond; patient reports that she "has no problem" doing this many reps, and assures me that she would not do this many if she could not tolerate it.  Provider appointments: -- confirmed with patient/ caregiver no recently attended provider appointments; no upcoming provider appointments -- to attend scheduled appointment for fitting of new (R) BKA prosthetic on August 13, 2017; [name] to provide transportation; patient remains very excited about this appointment   Safety/ Mobility/ Falls: -- denies new/ recent fallspost- SNF discharge -- assistive devices:has walker;continuesusing wheelchair primarily post-SNF discharge until new prosthetic is obtained. -- continues wearing previously obtained (L) BKA prosthetic "all the time"   Patientdeniesfurther issues, concerns, or problems today. I confirmed that patient hasmy direct phone number, the main [facility] CM office phone number, and the [facility] CM 24-hour nurse advice phone number should issues arise prior to next scheduled [place] scheduled phone call next week. Encouraged patient/ caregiverto contact me directly if needs, questions, issues, or concerns arise prior to next scheduled outreach; patient/ caregiveragreed to do so.  Plan:  Patient will take medications as prescribed and will attend all scheduled provider appointment post-SNF discharge  Patient will actively participate in home health services as ordered post-hospital discharge; patient will continue performing post- BKA exercises as recommended by home health PT  Patient will use assistive devices for ambulation/ mobility/ fall prevention  [facility] Community CM outreach for ongoing transition of care to continue with scheduledtelephone callnext week.  [facility] CM Care Plan Problem One     Most Recent Value   Care  Plan Problem One  Risk for hospital readmission, related to/ as evidenced by recent hospitalization and SNF visit for (R) BKA  Role Documenting the Problem One  Care Management [place] for Problem One  Active  THN Long Term Goal   Over the next 31 days, patient will not experience hospital readmission, as evidenced by patient/ caregiver reporting and review of EMR during [facility] RN CCM outreach  [facility] Long Term Goal Start Date  07/15/17  Interventions for Problem One Long Term Goal  Discussed with patient and caregiver patient's current clinical status,  confirmed that patient has had no recent changes to medications, and has not questions/ concerns around her condition/ medications  THN CM Short Term Goal #1   Over the next 30 days, patient will actively participate in home health services as ordered post-SNF discharge, as evidenced by patient/ caregiver reporting and collaboration with home health team as indicated, during [facility] RN CCM outreach  [facility] CM Short Term Goal #1 Start Date  07/15/17  Interventions for Short Term Goal #1  Discussed with patient and caregiver current [facility] services in place, recent [facility] visits, patient's participation in [facility] services,  provided positive reinforcement for patient going above and beyond with independently performing [place] PT exercises    [facility] CM Care Plan Problem Two     Most Recent Value  Care Plan Problem Two  Impaired mobility related to/ as evidenced by patient's recent (R) BKA, making patient a bilateral BKA  Role Documenting the Problem Two  Care Management Coordinator  Care Plan for Problem Two  Active  Interventions for Problem Two Long Term Goal   Confirmed that patient has continued using her previously acquired (L) prosthetic and continues maintaining exercises for ambulation/ LE strengthening every day, positive reinforcement provided  THN Long Term Goal  Over the next 50 days, patient will be able to use newly acquired prosthetic for  ambulation, as evidenced by patient and caregiver reporting/ demonstration during [place] outreach  [place] Term Goal Start Date  07/30/17  [facility] CM Short Term Goal #1   Over the next 14 days, patient will attend scheduled appointment for new prosthetic fitting, as evidenced by patient/ caregiver reporting during [facility] RN CCM outreach  [facility] CM Short Term Goal #1 Start Date  07/30/17  Interventions for Short Term Goal #2   Confirmed with patient and caregiver that patient's scheduled appointment has not changed and that caregiver will provide transportation to appointment     Oneta Rack, RN, BSN, [place] Coordinator [facility] Care Management  [phone]

## 2017-08-07 ENCOUNTER — Telehealth: Payer: Self-pay | Admitting: *Deleted

## 2017-08-07 DIAGNOSIS — I10 Essential (primary) hypertension: Secondary | ICD-10-CM | POA: Diagnosis not present

## 2017-08-07 DIAGNOSIS — E440 Moderate protein-calorie malnutrition: Secondary | ICD-10-CM | POA: Diagnosis not present

## 2017-08-07 DIAGNOSIS — Z4781 Encounter for orthopedic aftercare following surgical amputation: Secondary | ICD-10-CM | POA: Diagnosis not present

## 2017-08-07 DIAGNOSIS — K219 Gastro-esophageal reflux disease without esophagitis: Secondary | ICD-10-CM | POA: Diagnosis not present

## 2017-08-07 DIAGNOSIS — I739 Peripheral vascular disease, unspecified: Secondary | ICD-10-CM | POA: Diagnosis not present

## 2017-08-07 DIAGNOSIS — M810 Age-related osteoporosis without current pathological fracture: Secondary | ICD-10-CM | POA: Diagnosis not present

## 2017-08-07 NOTE — Telephone Encounter (Signed)
Received Home Health Certification and Plan of Care; forwarded to provider/SLS 03/29

## 2017-08-10 DIAGNOSIS — E440 Moderate protein-calorie malnutrition: Secondary | ICD-10-CM | POA: Diagnosis not present

## 2017-08-10 DIAGNOSIS — I739 Peripheral vascular disease, unspecified: Secondary | ICD-10-CM | POA: Diagnosis not present

## 2017-08-10 DIAGNOSIS — M810 Age-related osteoporosis without current pathological fracture: Secondary | ICD-10-CM | POA: Diagnosis not present

## 2017-08-10 DIAGNOSIS — Z4781 Encounter for orthopedic aftercare following surgical amputation: Secondary | ICD-10-CM | POA: Diagnosis not present

## 2017-08-10 DIAGNOSIS — I10 Essential (primary) hypertension: Secondary | ICD-10-CM | POA: Diagnosis not present

## 2017-08-10 DIAGNOSIS — K219 Gastro-esophageal reflux disease without esophagitis: Secondary | ICD-10-CM | POA: Diagnosis not present

## 2017-08-11 DIAGNOSIS — Z4781 Encounter for orthopedic aftercare following surgical amputation: Secondary | ICD-10-CM | POA: Diagnosis not present

## 2017-08-11 DIAGNOSIS — K219 Gastro-esophageal reflux disease without esophagitis: Secondary | ICD-10-CM | POA: Diagnosis not present

## 2017-08-11 DIAGNOSIS — I739 Peripheral vascular disease, unspecified: Secondary | ICD-10-CM | POA: Diagnosis not present

## 2017-08-11 DIAGNOSIS — E440 Moderate protein-calorie malnutrition: Secondary | ICD-10-CM | POA: Diagnosis not present

## 2017-08-11 DIAGNOSIS — I10 Essential (primary) hypertension: Secondary | ICD-10-CM | POA: Diagnosis not present

## 2017-08-11 DIAGNOSIS — M810 Age-related osteoporosis without current pathological fracture: Secondary | ICD-10-CM | POA: Diagnosis not present

## 2017-08-12 ENCOUNTER — Other Ambulatory Visit: Payer: Self-pay | Admitting: *Deleted

## 2017-08-12 ENCOUNTER — Encounter: Payer: Self-pay | Admitting: *Deleted

## 2017-08-12 DIAGNOSIS — I10 Essential (primary) hypertension: Secondary | ICD-10-CM | POA: Diagnosis not present

## 2017-08-12 DIAGNOSIS — E440 Moderate protein-calorie malnutrition: Secondary | ICD-10-CM | POA: Diagnosis not present

## 2017-08-12 DIAGNOSIS — Z4781 Encounter for orthopedic aftercare following surgical amputation: Secondary | ICD-10-CM | POA: Diagnosis not present

## 2017-08-12 DIAGNOSIS — I739 Peripheral vascular disease, unspecified: Secondary | ICD-10-CM | POA: Diagnosis not present

## 2017-08-12 DIAGNOSIS — M810 Age-related osteoporosis without current pathological fracture: Secondary | ICD-10-CM | POA: Diagnosis not present

## 2017-08-12 DIAGNOSIS — K219 Gastro-esophageal reflux disease without esophagitis: Secondary | ICD-10-CM | POA: Diagnosis not present

## 2017-08-12 NOTE — Patient Outreach (Signed)
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  Today, caregiverreports that patient continues "doing really good" and he denies that patient pain, concerns, recent falls, or other problems/ issues. Reports today that home health staff are expected to visit today with the plan of discharging patient.  Caregiver continues to report that patient independently completes her [facility] PT exercises daily "above and beyond" the expectations/ recommendations given to her.  Again reports plans to attend scheduled appointment for fitting of new (R) BKA prosthetic tomorrow on August 13, 2017; [name] will provide transportation.  Patient continues primarily using wheelchair for ambulation/ mobility, and [name] reports that she is very excited to obtain new prosthetic and "start walking" again.  Patientdeniesfurther issues, concerns, or problems today. I confirmed that patient/caregiver havemy direct phone number, the main [facility] CM office phone number, and the [facility] CM 24-hour nurse advice phone number should issues arise prior to next scheduled [place] scheduled phone callnext week. Encouraged patient/ caregiverto contact me directly if needs, questions, issues, or concerns arise prior to next scheduled outreach;  patient/ caregiveragreed to do so.  Plan:  Patient will take medications as prescribed and will attend all scheduled provider appointment post-SNF discharge  Patient will continue performing post- BKA exercises as recommended by home health PT  Patient will use assistive devices for ambulation/ mobility/ fall prevention  [facility] Community CM outreach for ongoing transition of care to continue with scheduledtelephone callnext week.  [facility] CM Care Plan Problem One     Most Recent Value  Care Plan Problem One  Risk for hospital readmission, related to/ as evidenced by recent hospitalization and SNF visit for (R) BKA  Role Documenting the Problem One  Care Management [place] for Problem One  Active  [facility]N Long Term Goal   Over the next 31 days, patient will not experience hospital readmission, as evidenced by patient/ caregiver reporting and review of EMR during [facility] RN CCM outreach  [place] Term Goal Start Date  07/15/17  Interventions for Problem One Long Term Goal  Discussed current clinical condition/ status of patient with caregiver,  confirmed that caregiver has no concerns, questions around patient's status,  confirmed with caregiver that patient has experienced no new/ recent falls  [facility] CM Short Term Goal #1   Over the next 30 days, patient will actively participate in home health services as ordered post-SNF discharge, as evidenced by patient/ caregiver reporting and collaboration with home health team as indicated, during [facility] RN CCM outreach  [facility] CM Short Term Goal #1 Start Date  07/15/17  [facility] CM Short Term Goal #1 Met Date  08/12/17 - Goal Met  Interventions for Short Term Goal #1  Discussed with patient's caregiver that home health services will be ending today,  discussed patient's participation in [facility] services and confirmed that patient and caregiver believe patient is ready for [facility] service discharge  [facility]N CM Care Plan Problem Two     Most Recent Value  Care Plan  Problem Two  Impaired mobility related to/ as evidenced by patient's recent (R) BKA, making patient a bilateral BKA  Role Documenting the Problem Two  Care Management [place] for Problem Two  Active  Interventions for Problem Two Long Term Goal   Confirmed with patient's caregiver that patient is still independently performing daily exercises for BKA  THN Long Term Goal  Over the next 50 days, patient will be able to use newly acquired prosthetic for ambulation, as evidenced by patient and caregiver reporting/ demonstration during [place] outreach  [place] Term Goal Start Date  07/30/17  [facility] CM Short Term Goal #1   Over the next 14 days, patient will attend scheduled appointment for new prosthetic fitting, as evidenced by patient/ caregiver reporting during [facility] RN CCM outreach  [facility] CM Short Term Goal #1 Start Date  07/30/17  Interventions for Short Term Goal #2   Confirmed that patient is planning to attend tomorrow's scheduled appointment for prosthetic fitting, and that caregiver will provide transportation     Oneta Rack, RN, BSN, [employer] Coordinator [facility] Care Management  [phone]

## 2017-08-17 ENCOUNTER — Other Ambulatory Visit: Payer: Self-pay | Admitting: *Deleted

## 2017-08-17 ENCOUNTER — Encounter: Payer: Self-pay | Admitting: *Deleted

## 2017-08-17 NOTE — Patient Outreach (Signed)
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  Received automated outgoing voice mail message stating that "the person you are trying to reach has a voice mail that has not been set up yet;" unable to leave patient/ caregiver VM msg requesting call back.  12:00 noon: Patient's grandson/ HCPOA returned my call from earlier this morning; patient HIPAA/ identity verified with caregiver Byrontoday.  Today, [name] continues toreport thatpatient is"doing great" and he denies that patient is in pain, has concerns, recent falls, or other problems/ issues. reports "everything is about the same, and she has kept up the good work."  Confirms that patient continues independently completing her [place] PT BKA exercises daily "above and beyond" the expectations/ recommendations given to her by [facility] PT, and further confirms that patient was discharged form "all" home health services "last week."  Reports that he took patient to scheduled appointment for fitting of new (R) BKA prosthetic on August 13, 2017, where patient was "fitted and measured" for new prosthetic.  States that they have an appointment on August 27, 2017 to pick up new prosthetic.  Reports patient continues primarily using wheelchair for ambulation/ mobility for now, and [name] reports that she remains very  excited to obtain new prosthetic and "start walking" again.  Patientdeniesfurther issues, concerns, or problems today. I confirmed that patient/caregiver havemy direct phone number, the main [facility] CM office phone number, and the [facility] CM 24-hour nurse advice phone number should issues arise prior to next scheduled [place] scheduled phone callpost- next prosthetic appointment. Encouraged patient/ caregiverto contact me directly if needs, questions, issues, or concerns arise prior to next scheduled outreach; patient/caregiveragreed to do so.  Plan:  Patient will take medications as prescribed and will attend all scheduled provider appointment post-SNF discharge  Patient will continue independently performing post- BKA exercises as recommended by home health PT  Patient will use assistive devices for ambulation/ mobility/ fall prevention  [facility] Community CM outreach to continue with scheduledtelephone callpost- next prosthetic appointment.  [facility] CM Care Plan Problem One     Most Recent Value  Care Plan Problem One  Risk for hospital readmission, related to/ as evidenced by recent hospitalization and SNF visit for (R) BKA  Role Documenting the Problem One  Care Management [place] for Problem One  Not Active  THN Long Term Goal   Over the next 31 days, patient will not experience hospital readmission, as evidenced by patient/ caregiver reporting and review of EMR during [facility] RN CCM outreach  [facility] Long Term Goal Start Date  07/15/17  [facility] Long Term Goal Met Date  08/17/17 - Goal Met  Interventions for Problem One Long Term Goal  Confirmed with patient's caregiver that patient has not experienced hospital re-admission since discharge from SNF    [facility] CM Care Plan Problem Two     Most Recent Value  Care Plan Problem Two  Impaired mobility related to/  as evidenced by patient's recent (R) BKA, making patient a bilateral BKA  Role Documenting the Problem  Two  Care Management [place] for Problem Two  Active  Interventions for Problem Two Long Term Goal   Confirmed with patient's caregiver that she has continued independently performing BKS exercises since [facility] services have been completed,  provided positive reinforcement and encouragement for patient to continue well-established practices for transition to new prosthetic  THN Long Term Goal  Over the next 50 days, patient will be able to use newly acquired prosthetic for ambulation, as evidenced by patient and caregiver reporting/ demonstration during [place] outreach  [place] Term Goal Start Date  07/30/17  [facility] CM Short Term Goal #1   Over the next 14 days, patient will attend scheduled appointment for new prosthetic fitting, as evidenced by patient/ caregiver reporting during [facility] RN CCM outreach  [facility] CM Short Term Goal #1 Start Date  07/30/17  [facility] CM Short Term Goal #1 Met Date   08/17/17 - Goal met  Interventions for Short Term Goal #2   Confirmed with patient's cargeiver that patient attended prosthetic fitting appointment last week and had measurements taken for new prosthetic,  obtained update from caregiver on next appointment, where patient is scheduled to obtain new prosthetic     Oneta Rack, RN, BSN, [place] Coordinator [facility] Care Management  [phone]

## 2017-08-28 ENCOUNTER — Encounter: Payer: Self-pay | Admitting: *Deleted

## 2017-08-28 ENCOUNTER — Other Ambulatory Visit: Payer: Self-pay | Admitting: *Deleted

## 2017-08-28 NOTE — Patient Outreach (Signed)
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  Today, [name] continues toreportthatpatient is"doinggreat" and he deniesthat patientis in pain, has concerns, recent falls, or other problems/ issues. Reports patient attended "casting appointment for new prosthetic" yesterday, and remains excited to know she will be receiving her new prosthetic leg, "within the next couple of weeks."  Reports home health services have completed ended, and that patient continues independently performing exercises she learned from [facility] PT, "going above and beyond" the recommended repetitions.  Reports overall, "no changes" to patient clinical condition, and denies concerns/ changes around patient's medications.  Reports patient continues primarily using wheelchair for ambulation/ mobility for now.  Discussed possibility of case closure with [name] today at time of next [place] outreach, as patient has thus far met all of her previously established [facility] CCM goals; [name] agrees with case closure at time of next [place], as indicated around patient's condition at that time.  Patient's caregiverdeniesfurther issues, concerns, or problems today. I confirmed that patient/caregiverhavemy direct phone number, the main [facility] CM office phone number, and  the [facility] CM 24-hour nurse advice phone number should issues arise prior to next scheduled [place] scheduled phone callin 2 weeks. Encouraged patient/ caregiverto contact me directly if needs, questions, issues, or concerns arise prior to next scheduled outreach; patient/caregiveragreed to do so.  Plan:  Patient will take medications as prescribed and will attend all scheduled provider appointment post-SNF discharge  Patient will continue independently performing post- BKA exercises as recommended by home health PT  Patient will use assistive devices for ambulation/ mobility/ fall prevention  [facility] Community CM outreach to continue with scheduledtelephone callin 2 weeks, possibly for case closure.  [facility] CM Care Plan Problem Two     Most Recent Value  Care Plan Problem Two  Impaired mobility related to/ as evidenced by patient's recent (R) BKA, making patient a bilateral BKA  Role Documenting the Problem Two  Care Management [place] for Problem Two  Active  Interventions for Problem Two Long Term Goal   Discussed with patient's caregiver patient's recent appointment for casting of new prosthetic, and expected progression for patient to receive new prosthetic,  confirmed that patient continues performing PT exercises independently and has experienced no new or recent falls  THN Long Term Goal  Over the next 50 days, patient will be able to use newly acquired prosthetic for ambulation, as evidenced by patient and caregiver reporting/ demonstration during [place] outreach  [place] Term Goal Start Date  07/30/17     Oneta Rack, RN, BSN, [place] Coordinator [facility] Care Management  [phone]

## 2017-09-03 ENCOUNTER — Telehealth: Payer: Self-pay | Admitting: Emergency Medicine

## 2017-09-03 NOTE — Telephone Encounter (Signed)
Copied from [place] [id number]. Topic: Quick Communication - See Telephone Encounter >> Sep 02, 2017 12:28 PM Hewitt Shorts wrote: CRM for notification. See Telephone encounter for: 09/02/17.dana with [facility] is calling to let provider know that pt missed her appt with [facility] on 08/17/17 and has been discharged from there services   Best number for [facility] at [place]

## 2017-09-14 ENCOUNTER — Other Ambulatory Visit: Payer: Self-pay | Admitting: *Deleted

## 2017-09-14 ENCOUNTER — Encounter: Payer: Self-pay | Admitting: *Deleted

## 2017-09-14 NOTE — Patient Outreach (Signed)
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  Received automated outgoing voicemail message for patient's caregiver, stating that 'the person you are trying to reach has a voice mail that has not been set up yet;" unable to leave caregiver/ patient voice message requesting call back  11:35 am:  Patient and caregiver returned my phone call from earlier today; HIPAA/ identity verified  Today,caregiver/ grandson [name] and patient reportsthatpatientis"doinggreat" and patient deniespain,concerns, recent falls, or other problems/ issues. Reports today that patient continues waiting acquirement of her new prosthetic, and state that she has an appointment on Sep 22, 2017 to obtain new prosthetic; reports that outpatient PT has already been set up for patient post-acquirement of new prosthetic.  Patient states that she continues independently performing exercises she learned from [facility] PT, "and is still going above and beyond" the recommended repetitions for these exercises.  Reports overall, "no changes" to patient clinical condition.  Continues primarily using wheelchair for ambulation/ mobilityfor now.  Reports that grandson will continue providing transportation for patient to all scheduled appointment, and both patient and caregiver deny further care coordination  needs; both agree with [facility] CM case closure today, as patient has thus far partially met all of her previously established [facility] CM goals, and denies further care coordination needs.  I confirmed that patient/caregiverhavemy direct phone number, the main [facility] CM office phone number, and the [facility] CM 24-hour nurse advice phone number should issues arise in the future.  Plan:  Will close [name] case, as patient has partially met all of her previously established goals and denies further care coordination needs, and will make patient's PCP aware of same.  [facility] CM Care Plan Problem Two     Most Recent Value  Care Plan Problem Two  Impaired mobility related to/ as evidenced by patient's recent (R) BKA, making patient a bilateral BKA  Role Documenting the Problem Two  Care Management [place] for Problem Two  Not Active  Interventions for Problem Two Long Term Goal   Discussed with patient and caregiver patient's pending acquirement of her new prostehetic and ensured that patient has continued performing exercises for BKA while she awaits for prosthetic   [facility]N Long Term Goal  Over the next 50 days, patient will be able to use newly acquired prosthetic for ambulation, as evidenced by patient and caregiver reporting/ demonstration during [place] outreach  [place] Term Goal Start Date  07/30/17  [facility] Long Term Goal Met Date  09/14/17-- Goal partially met; see narrative     It has been a true pleasure participating in [name] care,  Gail Rack, RN, BSN, [employer] Coordinator [facility] Care Management  [phone]

## 2017-09-15 ENCOUNTER — Telehealth: Payer: Self-pay | Admitting: Emergency Medicine

## 2017-09-15 NOTE — Telephone Encounter (Signed)
Copied from [place] [id number]. Topic: General - Other >> Sep 15, 2017 10:36 AM Carolyn Stare wrote:  [facility] sent over paper work and call to verify if paperwork received  [phone]

## 2017-09-15 NOTE — Telephone Encounter (Signed)
Received Physician Orders from [facility]; forwarded to provider/SLS 05/07

## 2017-09-27 NOTE — Progress Notes (Addendum)
[place] [facility] [address], [place], [place] [address] [phone]  Date:  09/30/2017   Name:  [name]   DOB:  01-02-1932   MRN:  [id number]  PCP:  [name], MD    Chief Complaint: Hypertension (4-6 month follow up) and Right Leg Amputation   History of Present Illness:  [name] is a 84 y.o. very pleasant female patient who presents with the following:  Following up today From our last visit in March: She underwent a left BKA in 2016 due to severe PAD.  Her RIGHT LE was just amputated as well in February 2019.   She arrived back home from the SNF this past Monday- She is much happier now that she is back home.  [name] noted that she was not herself when she was in [facility] Since her son passed away her grand-son [name] has been instrumental in helping her.  He lives in a property just 100 yards from her home  ?2nd shingrix today- she is due but we don't have in stock  She would like to get a 2nd leg soon and they are working with Gerald Stabs with the [facility] in working towards getting her prosthesis  [name] is a bilateral below knee amputee She dos not have any co-morbidities which would impact her mobility or her ability to unction with prosthetics She has an existing LEFT prosthesis that is ill fitting; she needs a new socket for this prosthesis  She is a new amputee on the RIGHT side She communicates to me, verbally, a strong desire to get new prosthetics 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 She is not walking yet.  The hope is that when she has her 2nd leg she will be able to walk with a walker   BP controled with lisinopril Also on plavix, fosamax, prilosec  Pt reports that her mood is very good, she feels fortunate to have such supportive family and friends and is really feeling ok  Patient Active Problem List   Diagnosis Date Noted  . Ischemia of right lower extremity 06/19/2017  . Malnutrition of moderate degree 06/16/2017  . Ischemic foot 06/15/2017  . Cellulitis 06/15/2017  . PAD (peripheral artery disease) ([place])   . Osteoporosis 05/13/17  . Loss or death of child 30-Jun-2015  . Status post below knee amputation of left lower extremity ([place]) 01/31/2015  . Essential hypertension 08/22/2014    Past Medical History:  Diagnosis Date  . Arthritis   . Cancer ([place])    utertrine  . Hypertension   . Osteoporosis 05/13/17  . PAD (peripheral artery disease) ([place])     Past Surgical History:  Procedure Laterality Date  . ABDOMINAL HYSTERECTOMY     partial  . AMPUTATION Left  01/29/2015   Procedure: Left  AMPUTATION BELOW KNEE;  Surgeon: Elam Dutch, MD;  [facility];  Service: Vascular;  Laterality: Left;  . AMPUTATION Right 06/17/2017   Procedure: Right  BELOW KNEE Amputation;  Surgeon: Elam Dutch, MD;  Location: [place];  Service: Vascular;  Laterality: Right;  . KNEE SURGERY Right 1984  . PERIPHERAL VASCULAR CATHETERIZATION N/A 09/15/2014   Procedure: Abdominal Aortogram;  Surgeon: Elam Dutch, MD;  Location: [facility] INVASIVE CV LAB CUPID;  Service: Cardiovascular;  Laterality: N/A;  . PERIPHERAL VASCULAR CATHETERIZATION Bilateral 09/15/2014   Procedure: Lower Extremity Angiography;  Surgeon: Elam Dutch, MD;  Location: [place] INVASIVE CV LAB CUPID;  Service:  Cardiovascular;  Laterality: Bilateral;  . SPINE SURGERY      Social History   Tobacco Use  . Smoking status: Never Smoker  . Smokeless tobacco: Never Used  Substance Use Topics  . Alcohol use: No    Alcohol/week: 0.0 oz  . Drug use: No    Family History  Problem Relation Age of Onset  . Diabetes Mother   . Heart disease Mother   . Dementia Sister   . Diabetes Son     No Known Allergies  Medication list has been reviewed and updated.  Current Outpatient Medications on File Prior to Visit  Medication Sig Dispense Refill  . acetaminophen (TYLENOL) 325 MG tablet Take 1-2 tablets (325-650 mg total) by mouth every 4 (four) hours as needed for mild pain (or temp >/= 101 F).    Marland Kitchen alendronate (FOSAMAX) 70 MG tablet     . clopidogrel (PLAVIX) 75 MG tablet Take 1 tablet (75 mg total) by mouth daily. 90 tablet 3  . lisinopril (PRINIVIL,ZESTRIL) 10 MG tablet Take 1 tablet (10 mg total) by mouth daily.    Marland Kitchen lisinopril (PRINIVIL,ZESTRIL) 20 MG tablet Take 20 mg by mouth daily.  3  . omeprazole (PRILOSEC) 20 MG capsule Take 20 mg by mouth daily.    Vladimir Faster Glycol-Propyl Glycol (SYSTANE FREE OP) Place 1 drop into both eyes daily. For dry eyes     No current facility-administered medications on file prior to visit.     Review of Systems:  As per HPI- otherwise negative. No fever or chills No CP or SOB No GI sx noted    Physical Examination: Vitals:   09/30/17 0955  BP: 140/80  Pulse: 86  Resp: 16  Temp: 97.8 F (36.6 C)  SpO2: 97%   There were no vitals filed for this visit. There is no height or weight on file to calculate BMI. Ideal Body Weight:    GEN: WDWN, NAD, Non-toxic, A & O x 3, looks well  HEENT: Atraumatic, Normocephalic. Neck supple. No masses, No LAD. Ears and Nose: No external deformity. CV: RRR, No M/G/R. No JVD. No thrill. No extra heart sounds. PULM: CTA B, no wheezes, crackles, rhonchi. No retractions. No resp. distress. No accessory muscle  use. ABD: S, NT, ND, +BS. No rebound. No HSM. EXTR: No c/c/e NEURO in WC. She has BKA bilaterally  PSYCH: Normally interactive. Conversant. Not depressed or anxious appearing.  Calm demeanor.  Examined right stump- looks great.  No sign of infection or ulceration    Assessment and Plan: Post-operative state  Hyponatremia - Plan: Comprehensive metabolic panel  Hyperkalemia - Plan: Comprehensive metabolic panel  Essential hypertension - Plan: CBC  Peripheral arterial disease (HCC)  Hyperglycemia - Plan: Comprehensive metabolic panel, Hemoglobin A1c  Screening for hyperlipidemia - Plan: Lipid  panel  Following up today S/p left BKA in 2016 and right BKA earlier this year due to severe PVD She is doing well  Labs today pending as above BP is controlled  Signed Lamar Blinks, MD Labs received, message to pt   Labs look very good overall Your A1c does show that you have pre-diabetes, but we will monitor this for now.  Take care and please see me in about 6 months  Results for orders placed or performed in visit on 09/30/17  Comprehensive metabolic panel  Result Value Ref Range   Sodium 140 135 - 145 mEq/L   Potassium 4.6 3.5 - 5.1 mEq/L   Chloride 104 96 - 112 mEq/L   CO2 29 19 - 32 mEq/L   Glucose, Bld 90 70 - 99 mg/dL   BUN 13 6 - 23 mg/dL   Creatinine, Ser 0.39 (L) 0.40 - 1.20 mg/dL   Total Bilirubin 0.3 0.2 - 1.2 mg/dL   Alkaline Phosphatase 69 39 - 117 U/L   AST 12 0 - 37 U/L   ALT 8 0 - 35 U/L   Total Protein 6.5 6.0 - 8.3 g/dL   Albumin 4.1 3.5 - 5.2 g/dL   Calcium 9.6 8.4 - 10.5 mg/dL   GFR 166.49 >60.00 mL/min  CBC  Result Value Ref Range   WBC 4.4 4.0 - 10.5 K/uL   RBC 4.51 3.87 - 5.11 Mil/uL   Platelets 239.0 150.0 - 400.0 K/uL   Hemoglobin 13.7 12.0 - 15.0 g/dL   HCT 40.6 36.0 - 46.0 %   MCV 90.1 78.0 - 100.0 fl   MCHC 33.8 30.0 - 36.0 g/dL   RDW 15.3 11.5 - 15.5 %  Lipid panel  Result Value Ref Range   Cholesterol 124 0 - 200 mg/dL    Triglycerides 64.0 0.0 - 149.0 mg/dL   HDL 47.00 >39.00 mg/dL   VLDL 12.8 0.0 - 40.0 mg/dL   LDL Cholesterol 64 0 - 99 mg/dL   Total CHOL/HDL Ratio 3    NonHDL 76.74   Hemoglobin A1c  Result Value Ref Range   Hgb A1c MFr Bld 6.1 4.6 - 6.5 %

## 2017-09-30 ENCOUNTER — Encounter: Payer: Self-pay | Admitting: Family Medicine

## 2017-09-30 ENCOUNTER — Ambulatory Visit (INDEPENDENT_AMBULATORY_CARE_PROVIDER_SITE_OTHER): Payer: Medicare Other | Admitting: Family Medicine

## 2017-09-30 VITALS — BP 140/80 | HR 86 | Temp 97.8°F | Resp 16

## 2017-09-30 DIAGNOSIS — I10 Essential (primary) hypertension: Secondary | ICD-10-CM

## 2017-09-30 DIAGNOSIS — I739 Peripheral vascular disease, unspecified: Secondary | ICD-10-CM | POA: Diagnosis not present

## 2017-09-30 DIAGNOSIS — Z9889 Other specified postprocedural states: Secondary | ICD-10-CM | POA: Diagnosis not present

## 2017-09-30 DIAGNOSIS — E875 Hyperkalemia: Secondary | ICD-10-CM | POA: Diagnosis not present

## 2017-09-30 DIAGNOSIS — Z13220 Encounter for screening for lipoid disorders: Secondary | ICD-10-CM | POA: Diagnosis not present

## 2017-09-30 DIAGNOSIS — R739 Hyperglycemia, unspecified: Secondary | ICD-10-CM | POA: Diagnosis not present

## 2017-09-30 DIAGNOSIS — E871 Hypo-osmolality and hyponatremia: Secondary | ICD-10-CM

## 2017-09-30 LAB — LIPID PANEL
Cholesterol: 124 mg/dL (ref 0–200)
HDL: 47 mg/dL (ref >39.00)
LDL CALC: 64 mg/dL (ref 0–99)
NONHDL: 76.74
Total CHOL/HDL Ratio: 3
Triglycerides: 64 mg/dL (ref 0.0–149.0)
VLDL: 12.8 mg/dL (ref 0.0–40.0)

## 2017-09-30 LAB — COMPREHENSIVE METABOLIC PANEL
ALBUMIN: 4.1 g/dL (ref 3.5–5.2)
ALK PHOS: 69 U/L (ref 39–117)
ALT: 8 U/L (ref 0–35)
AST: 12 U/L (ref 0–37)
BUN: 13 mg/dL (ref 6–23)
CALCIUM: 9.6 mg/dL (ref 8.4–10.5)
CHLORIDE: 104 meq/L (ref 96–112)
CO2: 29 mEq/L (ref 19–32)
Creatinine, Ser: 0.39 mg/dL — ABNORMAL LOW (ref 0.40–1.20)
GFR: 166.49 mL/min (ref >60.00)
Glucose, Bld: 90 mg/dL (ref 70–99)
Potassium: 4.6 mEq/L (ref 3.5–5.1)
SODIUM: 140 meq/L (ref 135–145)
TOTAL PROTEIN: 6.5 g/dL (ref 6.0–8.3)
Total Bilirubin: 0.3 mg/dL (ref 0.2–1.2)

## 2017-09-30 LAB — CBC
HEMATOCRIT: 40.6 % (ref 36.0–46.0)
HEMOGLOBIN: 13.7 g/dL (ref 12.0–15.0)
MCHC: 33.8 g/dL (ref 30.0–36.0)
MCV: 90.1 fl (ref 78.0–100.0)
PLATELETS: 239 10*3/uL (ref 150.0–400.0)
RBC: 4.51 Mil/uL (ref 3.87–5.11)
RDW: 15.3 % (ref 11.5–15.5)
WBC: 4.4 10*3/uL (ref 4.0–10.5)

## 2017-09-30 LAB — HEMOGLOBIN A1C: HEMOGLOBIN A1C: 6.1 % (ref 4.6–6.5)

## 2017-09-30 NOTE — Patient Instructions (Addendum)
Great to se you as always!  Take care and I will be in touch with your labs asap  Please let me know how I can help with your prosthesis process- I am glad to do whatever needs to be done  Assuming all is well please see me in 4-6 months for a routine recheck   We are still not in stock with Shingrix- I am sorry!

## 2017-12-23 ENCOUNTER — Other Ambulatory Visit: Payer: Self-pay | Admitting: Family Medicine

## 2017-12-23 DIAGNOSIS — I10 Essential (primary) hypertension: Secondary | ICD-10-CM

## 2018-02-01 DIAGNOSIS — Z23 Encounter for immunization: Secondary | ICD-10-CM | POA: Diagnosis not present

## 2018-04-02 ENCOUNTER — Ambulatory Visit: Payer: Medicare Other | Admitting: *Deleted

## 2018-04-03 NOTE — Progress Notes (Deleted)
[place] at [facility] [address] [place], [name] [address] [phone]  Date:  04/05/2018   Name:  [name]   DOB:  1932-04-07   MRN:  [id number]  PCP:  [name], MD    Chief Complaint: No chief complaint on file.   History of Present Illness:  [name] is a 84 y.o. very pleasant female patient who presents with the following:  Periodic follow-up visit today History of severe PAD s/p bilateral BKA, HTN She underwent a left BKA in 2016 due to severe PAD. Her RIGHT LE was just amputated as well in February 2019.  She arrived back home from the SNF this past Monday-She is much happier now that she is back home. [name] noted that she was not herself when she was in [facility] Since her son passed away her grand-son [name] has been instrumental in helping her. He lives in a property just 100 yards from her home Never a smoker Last seen here in May of this year at which time she was doing quite well Her BW at that time did show pre-diabetes Flu is done ?tetanus Complete labs done in May Lab Results  Component Value Date   HGBA1C 6.1 09/30/2017     Patient Active Problem List   Diagnosis Date Noted  . Ischemia of right lower extremity 06/19/2017  . Malnutrition of moderate degree 06/16/2017  . Ischemic foot 06/15/2017  . Cellulitis 06/15/2017  . PAD (peripheral artery disease) ([place])   . Osteoporosis 05-12-2017  . Loss or death of child 06-29-2015  . Status post below knee amputation of left lower extremity 01/31/2015  . Essential hypertension 08/22/2014    Past Medical History:  Diagnosis Date  . Arthritis   . Cancer ([place])    utertrine  . Hypertension   . Osteoporosis 05-12-2017  . PAD (peripheral artery disease) ([place])     Past Surgical History:  Procedure Laterality Date  . ABDOMINAL HYSTERECTOMY     partial  . AMPUTATION Left 01/29/2015   Procedure: Left  AMPUTATION BELOW KNEE;   Surgeon: Elam Dutch, MD;  Location: [place];  Service: Vascular;  Laterality: Left;  . AMPUTATION Right 06/17/2017   Procedure: Right  BELOW KNEE Amputation;  Surgeon: Elam Dutch, MD;  Location: [place];  Service: Vascular;  Laterality: Right;  . KNEE SURGERY Right 1984  . PERIPHERAL VASCULAR CATHETERIZATION N/A 09/15/2014   Procedure: Abdominal Aortogram;  Surgeon: Elam Dutch, MD;  Location: [facility] INVASIVE CV LAB CUPID;  Service: Cardiovascular;  Laterality: N/A;  . PERIPHERAL VASCULAR CATHETERIZATION Bilateral 09/15/2014   Procedure: Lower Extremity Angiography;  Surgeon: Elam Dutch, MD;  Location: [place] INVASIVE CV LAB CUPID;  Service: Cardiovascular;  Laterality: Bilateral;  . SPINE SURGERY      Social History   Tobacco Use  . Smoking status: Never Smoker  . Smokeless tobacco: Never Used  Substance Use Topics  . Alcohol use: No    Alcohol/week: 0.0 standard drinks  . Drug use: No    Family History  Problem Relation Age of Onset  . Diabetes Mother   . Heart disease Mother   . Dementia Sister   . Diabetes Son     No Known Allergies  Medication list has been reviewed and updated.  Current Outpatient Medications on File Prior to Visit  Medication Sig Dispense Refill  . acetaminophen (TYLENOL) 325 MG tablet Take 1-2 tablets (325-650 mg  total) by mouth every 4 (four) hours as needed for mild pain (or temp >/= 101 F).    Marland Kitchen alendronate (FOSAMAX) 70 MG tablet     . clopidogrel (PLAVIX) 75 MG tablet Take 1 tablet (75 mg total) by mouth daily. 90 tablet 3  . lisinopril (PRINIVIL,ZESTRIL) 10 MG tablet Take 1 tablet (10 mg total) by mouth daily.    Marland Kitchen lisinopril (PRINIVIL,ZESTRIL) 20 MG tablet Take 20 mg by mouth daily.  3  . lisinopril (PRINIVIL,ZESTRIL) 20 MG tablet TAKE 1 TABLET BY MOUTH EVERY DAY 90 tablet 1  . omeprazole (PRILOSEC) 20 MG capsule Take 20 mg by mouth daily.    Vladimir Faster Glycol-Propyl Glycol (SYSTANE FREE OP) Place 1 drop into both eyes daily. For dry  eyes     No current facility-administered medications on file prior to visit.     Review of Systems:  As per HPI- otherwise negative.   Physical Examination: There were no vitals filed for this visit. There were no vitals filed for this visit. There is no height or weight on file to calculate BMI. Ideal Body Weight:    GEN: WDWN, NAD, Non-toxic, A & O x 3 HEENT: Atraumatic, Normocephalic. Neck supple. No masses, No LAD. Ears and Nose: No external deformity. CV: RRR, No M/G/R. No JVD. No thrill. No extra heart sounds. PULM: CTA B, no wheezes, crackles, rhonchi. No retractions. No resp. distress. No accessory muscle use. ABD: S, NT, ND, +BS. No rebound. No HSM. EXTR: No c/c/e NEURO Normal gait.  PSYCH: Normally interactive. Conversant. Not depressed or anxious appearing.  Calm demeanor.    Assessment and Plan: ***  Signed Lamar Blinks, MD

## 2018-04-05 ENCOUNTER — Ambulatory Visit: Payer: Medicare Other | Admitting: Family Medicine

## 2018-04-10 NOTE — Progress Notes (Signed)
[place] at [facility] [address], [place], [name] [address] [phone]  Date:  04/14/2018   Name:  [name]   DOB:  12/21/1931   MRN:  [id number]  PCP:  [name], MD    Chief Complaint: No chief complaint on file.   History of Present Illness:  [name] is a 84 y.o. very pleasant female patient who presents with the following:  Following up today History of bilateral BKA due to severe PAD, HTN (Left 2016,ight this past February) Labs done in May, ok. Offered to do labs today but she prefers to do at next visit, which is fine   Admits that she is not walking at home- she is just riding in her WC. They report that she has a walker but is not really using it .  She does not feel confident walking and is afraid of falls  However she has not had any falls  They report having contacted me via phone 2-3 months ago and requesting that I order PT. However I never heard about this and there is no documentation in the chart - apologized for not responding and advised that if this happens again something went wrong, please follow-up with me as I will never ignore a message   She requests a referral to neuro rehab on [address], will place this order now  Her grandson is with her and helps her since her son passed away in 2015-08-18  She already had the shingles vaccine at [facility].  mentioned tetanus to her - she will think about this at her convenience   She is on lisinopril and plavix   Also, she has noted discolored nasal discharge and some pain/ pressure in her sinuses for the last 2 weeks No fever She is blowing out discolored mucus from her nose  Minor cough -her sx are more located in her head and sinuses  Patient Active Problem List   Diagnosis Date Noted  . Ischemia of right lower extremity 06/19/2017  . Malnutrition of moderate degree 06/16/2017  . Ischemic foot 06/15/2017  . Cellulitis  06/15/2017  . PAD (peripheral artery disease) ([place])   . Osteoporosis April 28, 2017  . Loss or death of child 2015-06-15  . Status post below knee amputation of left lower extremity 01/31/2015  . Essential hypertension 08/22/2014    Past Medical History:  Diagnosis Date  . Arthritis   . Cancer ([place])    utertrine  . Hypertension   . Osteoporosis 04/28/2017  . PAD (peripheral artery disease) ([place])     Past Surgical History:  Procedure Laterality Date  . ABDOMINAL HYSTERECTOMY     partial  . AMPUTATION Left 01/29/2015   Procedure: Left  AMPUTATION BELOW KNEE;  Surgeon: Elam Dutch, MD;  Location: [place];  Service: Vascular;  Laterality: Left;  . AMPUTATION Right 06/17/2017   Procedure: Right  BELOW KNEE Amputation;  Surgeon: Elam Dutch, MD;  Location: [place];  Service: Vascular;  Laterality: Right;  . KNEE SURGERY Right 08-18-1982  . PERIPHERAL VASCULAR CATHETERIZATION N/A 09/15/2014   Procedure: Abdominal Aortogram;  Surgeon: Elam Dutch, MD;  Location: [facility] INVASIVE CV LAB CUPID;  Service: Cardiovascular;  Laterality: N/A;  . PERIPHERAL VASCULAR CATHETERIZATION Bilateral 09/15/2014   Procedure: Lower Extremity Angiography;  Surgeon: Elam Dutch, MD;  Location: [place] INVASIVE CV LAB CUPID;  Service: Cardiovascular;  Laterality: Bilateral;  . SPINE SURGERY  Social History   Tobacco Use  . Smoking status: Never Smoker  . Smokeless tobacco: Never Used  Substance Use Topics  . Alcohol use: No    Alcohol/week: 0.0 standard drinks  . Drug use: No    Family History  Problem Relation Age of Onset  . Diabetes Mother   . Heart disease Mother   . Dementia Sister   . Diabetes Son     No Known Allergies  Medication list has been reviewed and updated.  Current Outpatient Medications on File Prior to Visit  Medication Sig Dispense Refill  . acetaminophen (TYLENOL) 325 MG tablet Take 1-2 tablets (325-650 mg total) by mouth every 4 (four) hours as needed for mild pain (or  temp >/= 101 F).    . clopidogrel (PLAVIX) 75 MG tablet Take 1 tablet (75 mg total) by mouth daily. 90 tablet 3  . lisinopril (PRINIVIL,ZESTRIL) 20 MG tablet TAKE 1 TABLET BY MOUTH EVERY DAY 90 tablet 1  . omeprazole (PRILOSEC) 20 MG capsule Take 20 mg by mouth daily.    Vladimir Faster Glycol-Propyl Glycol (SYSTANE FREE OP) Place 1 drop into both eyes daily. For dry eyes     No current facility-administered medications on file prior to visit.     Review of Systems:  As per HPI- otherwise negative. Denies any skin break down or ulceration on her behind   Physical Examination: Vitals:   04/14/18 1010  BP: 126/65  Pulse: 68  Resp: 16  Temp: 98.4 F (36.9 C)  SpO2: 97%   There were no vitals filed for this visit. There is no height or weight on file to calculate BMI. Ideal Body Weight:    GEN: WDWN, NAD, Non-toxic, A & O x 3, looks well  HEENT: Atraumatic, Normocephalic. Neck supple. No masses, No LAD. Ears and Nose: No external deformity. CV: RRR, No M/G/R. No JVD. No thrill. No extra heart sounds. PULM: CTA B, no wheezes, crackles, rhonchi. No retractions. No resp. distress. No accessory muscle use. ABD: S, NT, ND, +BS. No rebound. No HSM. EXTR: No c/c/e NEURO sitting in WC S/p bilateral BKA Wearing prostheses bilaterally  PSYCH: Normally interactive. Conversant. Not depressed or anxious appearing.  Calm demeanor.    Assessment and Plan: Mobility impaired - Plan: Ambulatory referral to Physical Therapy  Essential hypertension  S/P BKA (below knee amputation), right ([employer]) - Plan: Ambulatory referral to Physical Therapy  Status post below-knee amputation of left lower extremity ([place]) - Plan: Ambulatory referral to Physical Therapy  Acute non-recurrent frontal sinusitis - Plan: amoxicillin (AMOXIL) 500 MG capsule  Referral placed to PT as per her request BP is under control Continue plavix  amox for sinus infection Asked her to alert me if sx not  resolved   Signed Lamar Blinks, MD

## 2018-04-14 ENCOUNTER — Ambulatory Visit (INDEPENDENT_AMBULATORY_CARE_PROVIDER_SITE_OTHER): Payer: Medicare Other | Admitting: Family Medicine

## 2018-04-14 ENCOUNTER — Encounter: Payer: Self-pay | Admitting: Family Medicine

## 2018-04-14 VITALS — BP 126/65 | HR 68 | Temp 98.4°F | Resp 16

## 2018-04-14 DIAGNOSIS — I10 Essential (primary) hypertension: Secondary | ICD-10-CM | POA: Diagnosis not present

## 2018-04-14 DIAGNOSIS — Z89512 Acquired absence of left leg below knee: Secondary | ICD-10-CM | POA: Diagnosis not present

## 2018-04-14 DIAGNOSIS — J0110 Acute frontal sinusitis, unspecified: Secondary | ICD-10-CM | POA: Diagnosis not present

## 2018-04-14 DIAGNOSIS — Z89511 Acquired absence of right leg below knee: Secondary | ICD-10-CM

## 2018-04-14 DIAGNOSIS — Z7409 Other reduced mobility: Secondary | ICD-10-CM

## 2018-04-14 MED ORDER — AMOXICILLIN 500 MG PO CAPS: 1000.0000 mg | ORAL_CAPSULE | Freq: Two times a day (BID) | ORAL | 0 refills | Status: DC

## 2018-04-14 NOTE — Patient Instructions (Addendum)
Great to see you again today! I am going to refer you back to the physical therapist at neuro rehab to work on getting you up and walking  We will treat you for a sinus infection with amoxicillin take it twice a day for 10 days  Let me know if this does not resolve your sinus and tongue symptoms  Please plan to see me in about 6 months

## 2018-05-19 ENCOUNTER — Other Ambulatory Visit: Payer: Self-pay

## 2018-05-19 ENCOUNTER — Encounter: Payer: Self-pay | Admitting: Physical Therapy

## 2018-05-19 ENCOUNTER — Ambulatory Visit: Payer: Medicare Other | Attending: Family Medicine | Admitting: Physical Therapy

## 2018-05-19 DIAGNOSIS — R2689 Other abnormalities of gait and mobility: Secondary | ICD-10-CM | POA: Insufficient documentation

## 2018-05-19 DIAGNOSIS — M79661 Pain in right lower leg: Secondary | ICD-10-CM | POA: Insufficient documentation

## 2018-05-19 DIAGNOSIS — R293 Abnormal posture: Secondary | ICD-10-CM | POA: Insufficient documentation

## 2018-05-19 DIAGNOSIS — R2681 Unsteadiness on feet: Secondary | ICD-10-CM | POA: Diagnosis not present

## 2018-05-19 DIAGNOSIS — M25661 Stiffness of right knee, not elsewhere classified: Secondary | ICD-10-CM | POA: Diagnosis not present

## 2018-05-19 DIAGNOSIS — M25662 Stiffness of left knee, not elsewhere classified: Secondary | ICD-10-CM | POA: Diagnosis not present

## 2018-05-19 DIAGNOSIS — M6281 Muscle weakness (generalized): Secondary | ICD-10-CM | POA: Insufficient documentation

## 2018-05-19 DIAGNOSIS — M79662 Pain in left lower leg: Secondary | ICD-10-CM | POA: Insufficient documentation

## 2018-05-19 NOTE — Therapy (Signed)
[facility] [address] [place], [name], [address] Phone: [phone]   Fax:  [phone]  Physical Therapy Evaluation  Patient Details  Name: [name] MRN: [id number] Date of Birth: [birth year] Referring Provider (PT): Lamar Blinks, MD   Encounter Date: 05/19/2018  PT End of Session - 05/19/18 2155    Visit Number  1    Number of Visits  25    Date for PT Re-Evaluation  08/17/18    Authorization Type  Medicare 20% coinsurance after meeting deductible    PT Start Time  0845    PT Stop Time  0930    PT Time Calculation (min)  45 min    Equipment Utilized During Treatment  Gait belt    Activity Tolerance  Patient tolerated treatment well;Patient limited by pain    Behavior During Therapy  [facility] for tasks assessed/performed       Past Medical History:  Diagnosis Date  . Arthritis   . 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 Hypertension   . Osteoporosis 04/15/2017  . PAD (peripheral artery disease) ([place])     Past Surgical History:  Procedure Laterality Date  . ABDOMINAL HYSTERECTOMY     partial  . AMPUTATION Left 01/29/2015   Procedure: Left  AMPUTATION BELOW KNEE;  Surgeon: Elam Dutch, MD;  Location: [place];  Service: Vascular;  Laterality: Left;  . AMPUTATION Right 06/17/2017   Procedure: Right  BELOW KNEE Amputation;  Surgeon: Elam Dutch, MD;  Location: [place];  Service: Vascular;  Laterality: Right;  . KNEE SURGERY Right 1984  . PERIPHERAL VASCULAR CATHETERIZATION N/A 09/15/2014   Procedure: Abdominal Aortogram;  Surgeon: Elam Dutch, MD;  Location: [facility] INVASIVE CV LAB CUPID;  Service: Cardiovascular;  Laterality: N/A;  . PERIPHERAL VASCULAR CATHETERIZATION Bilateral 09/15/2014   Procedure: Lower Extremity Angiography;  Surgeon: Elam Dutch, MD;  Location: [place] INVASIVE CV LAB CUPID;  Service: Cardiovascular;  Laterality: Bilateral;  . SPINE SURGERY      There were no vitals filed for this  visit.   Subjective Assessment - 05/19/18 0854    Subjective  This 85yo female was referred on 04/14/2018 by Lamar Blinks, MD with diagnosis of mobility impaired with Bilateral Transtibial Amputations. She underwent a right Transtibial Amputation on 06/17/2017 with history of Left Transtibial Amputation on 01/29/2015. She received right prosthesis ~June 2019.     Patient is accompained by:  Family member   grandson, [name]   Pertinent History  B TTTAs, PAD, HTN, arthritis, utertine CA, osteoporosis, Right knee sg 1984, spine sg    Limitations  Standing;Walking;House hold activities    Patient Stated Goals  Be able to walk around house and going out in community in grandson    Currently in Pain?  Yes    Pain Score  8    0/10 sitting, 8/10 prostheses standing   Pain Location  Leg   residual limbs   Pain Orientation  Right;Left    Pain Descriptors / Indicators  Throbbing    Pain Type  Chronic pain    Pain Onset  More than a month ago    Pain Frequency  Intermittent    Aggravating Factors   pressure on limbs & limbs cold    Pain Relieving Factors  sitting    Effect of Pain on Daily Activities  does not stand         [facility] PT Assessment - 05/19/18 0845  Assessment   Medical Diagnosis  Bil Transtibial Amputations    Referring Provider (PT)  Lamar Blinks, MD    Onset Date/Surgical Date  04/14/18   MD referral to PT   Hand Dominance  Right    Prior Therapy  some HHPT      Precautions   Precautions  Fall      Balance Screen   Has the patient fallen in the past 6 months  No    Has the patient had a decrease in activity level because of a fear of falling?   Yes    Is the patient reluctant to leave their home because of a fear of falling?   Yes      [place]  Private residence    Living Arrangements  Alone   grandson & 7yo great-granddtr live on property   Type of [place]  Two level;Able to  live on main level with bedroom/bathroom    [place] - 2 wheels;Walker - 4 wheels;Cane - single point;Bedside commode;Tub bench;Grab bars - tub/shower;Wheelchair - manual;Transport chair;Hospital bed      Prior Function   Level of Independence  Independent with household mobility without device;Independent with community mobility without device   used rollator walker with one prosthesis   Vocation  Retired    Leisure  spending time with family, reading,       Posture/Postural Control   Posture/Postural Control  Postural limitations    Postural Limitations  Rounded Shoulders;Forward head;Flexed trunk   wide stance     ROM / Strength   AROM / PROM / Strength  AROM;Strength      AROM   Overall AROM Comments  Bilateral knee extension -15*      Strength   Overall Strength  Deficits    Strength Assessment Site  Hip;Knee    Right/Left Hip  Right;Left    Right Hip Flexion  4/5    Right Hip Extension  3-/5    Right Hip ABduction  3/5    Left Hip Flexion  4/5    Left Hip Extension  3-/5    Left Hip ABduction  3/5    Right/Left Knee  Right;Left    Right Knee Flexion  3/5    Right Knee Extension  4/5    Left Knee Flexion  3/5    Left Knee Extension  4/5      Transfers   Transfers  Sit to Stand;Stand to Sit    Sit to Stand  4: Min assist;With upper extremity assist;With armrests;From chair/3-in-1;Other (comment)   to RW   Sit to Stand Details (indicate cue type and reason)  PT demo, instructed in technique with bilateral Transtibial Prostheses prior to attempt    Stand to Sit  4: Min assist;With upper extremity assist;With armrests;To chair/3-in-1   from RW   Stand to Sit Details  PT demo, instructed in technique with bilateral Transtibial Prostheses prior to attempt      Ambulation/Gait   Ambulation/Gait  Yes    Ambulation/Gait Assistance  3: Mod assist    Ambulation/Gait Assistance Details  excessive UE weight bearing on RW, Pt's LLE appears longer than RLE but may  be related to depth residual limbs are seated in socket. She has pelvic rise in stance on LLE.     Ambulation Distance (Feet)  25 Feet    Assistive device  Rolling walker;Prostheses    Gait Pattern  Step-through pattern;Decreased step length - right;Decreased step length - left;Decreased stride length;Right flexed knee in stance;Left flexed knee in stance;Wide base of support    Ambulation Surface  Indoor;Level      Balance   Balance Assessed  Yes      Static Standing Balance   Static Standing - Balance Support  Bilateral upper extremity supported   RW support   Static Standing - Level of Assistance  4: Min assist   Min guard   Static Standing - Comment/# of Minutes  2 minutes      Dynamic Standing Balance   Dynamic Standing - Balance Support  Bilateral upper extremity supported;During functional activity   reaches with single UE to 1" in front of RW   Dynamic Standing - Level of Assistance  4: Min assist    Dynamic Standing - Balance Activities  Head nods;Head turns;Reaching for objects    Dynamic Standing - Comments  scanning turning head to side only, looks up/down with cervical motion only. Reaches 1" anterior to RW with release of RW with one hand for <5 seconds.       Prosthetics Assessment - 05/19/18 0845      Prosthetics   Prosthetic Care Independent with  Prosthetic cleaning    Prosthetic Care Dependent with  Skin check;Residual limb care;Correct ply sock adjustment;Proper wear schedule/adjustment    Donning prosthesis   Supervision    Doffing prosthesis   Supervision    Current prosthetic wear tolerance (days/week)   daily    Current prosthetic wear tolerance (#hours/day)   reports wear 2-5 hrs/day    Current prosthetic weight-bearing tolerance (hours/day)   Patient reports residual limb pain up to 8/10 with standing & weight bearing     Edema  LLE with pitting edema    Residual limb condition   painful scar at medial incision bilateral LEs, redness at distal tibia &  patella, no open areas,                Objective measurements completed on examination: See above findings.      [facility] Adult PT Treatment/Exercise - 05/19/18 0845      Prosthetics   Prosthetic Care Comments   supporting prostheses on stool or foot plates when sitting    Education Provided  Skin check;Proper Donning;Proper wear schedule/adjustment;Other (comment)   see prosthetic care comments   Person(s) Educated  Patient;Other (comment)   grandson   Education Method  Explanation;Demonstration;Verbal cues    Education Method  Verbalized understanding;Returned demonstration;Tactile cues required;Verbal cues required;Needs further instruction               PT Short Term Goals - 05/19/18 2215      PT SHORT TERM GOAL #1   Title  Patient tolerates prostheses wear daily >6hrs with limb pain <6/10. (All STGs Target Date 06/18/2018)    Time  1    Period  Months    Status  New    Target Date  06/18/18      PT SHORT TERM GOAL #2   Title  Sit to/from stand chairs with armrests to RW with supervision.     Time  1    Period  Months    Status  New    Target Date  06/18/18      PT SHORT TERM GOAL #3   Title  Patient ambulates 5' with RW & prostheses with minimal guard.     Time  1    Period  Months    Status  New    Target Date  06/18/18        PT Long Term Goals - 05/19/18 2207      PT LONG TERM GOAL #1   Title  Patient demonstrates / verbalizes safe, independent prosthetic care to enable prosthesis use without issues.  (All LTGs Target Date 08/13/2018)    Time  3    Period  Months    Status  New    Target Date  08/13/18      PT LONG TERM GOAL #2   Title  Patient tolerates wear of prostheses >75% of awake hours without skin issues & limb pain <4/10 to enable function throughout her day.     Time  3    Period  Months    Status  New    Target Date  08/13/18      PT LONG TERM GOAL #3   Title  Standing balance with rolling walker & bilateral prostheses  reaching 5" anteriorly & laterally, scans environment with head & upper torso movements and manages clothes modified independent.     Time  3    Period  Months    Status  New    Target Date  08/13/18      PT LONG TERM GOAL #4   Title  Patient ambulates 4' with RW & bilateral prostheses around furniture modified independent to enable mobility in home.    Time  3    Period  Months    Status  New    Target Date  08/13/18      PT LONG TERM GOAL #5   Title  Patient ambulates 150' with RW & prostheses with grandson's assistance for community mobility.     Time  3    Period  Months    Status  New    Target Date  08/13/18      Additional Long Term Goals   Additional Long Term Goals  Yes      PT LONG TERM GOAL #6   Title  Patient negotiates ramps & curbs with RW & prostheses with grandson's assistance for community access.    Time  3    Period  Months    Status  New    Target Date  08/13/18             Plan - 05/19/18 2158    Clinical Impression Statement  This 85yo female was functional with RW & left prosthesis before undergoing amputation of right LE. She has limited prostheses wear and pain with wear & standing. She is dependent in prosthetic care. Patient has dependent, impaired standing balance and sit to/from stand transfers with high fall risk. Her gait requires assistance with deviations indicating fall risk. Patient would benefit from skilled PT care to improve function & safety with bilateral Transtibial Prostheses.     History and Personal Factors relevant to plan of care:  Patient lives alone with grandson living on property near by. B TTTAs, PAD, HTN, arthritis, utertine CA, osteoporosis, Right knee sg 1984, spine sg    Clinical Presentation  Stable    Clinical Decision Making  Low    Rehab Potential  Good    PT Frequency  2x / week    PT Duration  12 weeks    PT Treatment/Interventions  ADLs/Self Care Home Management;DME Instruction;Gait training;Stair  training;Functional mobility training;Therapeutic activities;Therapeutic exercise;Balance training;Neuromuscular re-education;Patient/family education;Prosthetic Training;Manual techniques;Vestibular  PT Next Visit Plan  HEP at sink, review prosthetic care, balance & gait with RW    Consulted and Agree with Plan of Care  Patient;Family member/caregiver    Family Member Consulted  grandson, [name]       Patient will benefit from skilled therapeutic intervention in order to improve the following deficits and impairments:  Abnormal gait, Decreased activity tolerance, Decreased balance, Decreased endurance, Decreased knowledge of use of DME, Decreased mobility, Decreased range of motion, Decreased scar mobility, Decreased strength, Dizziness, Impaired flexibility, Postural dysfunction, Prosthetic Dependency  Visit Diagnosis: Pain in right lower leg  Pain in left lower leg  Abnormal posture  Unsteadiness on feet  Muscle weakness (generalized)  Other abnormalities of gait and mobility  Stiffness of right knee, not elsewhere classified  Stiffness of left knee, not elsewhere classified     Problem List Patient Active Problem List   Diagnosis Date Noted  . Ischemia of right lower extremity 06/19/2017  . Malnutrition of moderate degree 06/16/2017  . Ischemic foot 06/15/2017  . Cellulitis 06/15/2017  . PAD (peripheral artery disease) ([place])   . Osteoporosis 2017-04-19  . Loss or death of child 2015-06-06  . Status post below-knee amputation of left lower extremity ([place]) 01/31/2015  . Essential hypertension 08/22/2014    Jamey Reas PT, DPT 05/19/2018, 10:18 PM  [facility] [address] [place], [name], [address] Phone: [phone]   Fax:  [phone]  Name: [name] MRN: [id number] Date of Birth: 01-May-1932

## 2018-05-26 ENCOUNTER — Ambulatory Visit: Payer: Medicare Other | Admitting: Physical Therapy

## 2018-05-26 ENCOUNTER — Encounter: Payer: Self-pay | Admitting: Physical Therapy

## 2018-05-26 DIAGNOSIS — R2689 Other abnormalities of gait and mobility: Secondary | ICD-10-CM | POA: Diagnosis not present

## 2018-05-26 DIAGNOSIS — R293 Abnormal posture: Secondary | ICD-10-CM

## 2018-05-26 DIAGNOSIS — R2681 Unsteadiness on feet: Secondary | ICD-10-CM | POA: Diagnosis not present

## 2018-05-26 DIAGNOSIS — M25662 Stiffness of left knee, not elsewhere classified: Secondary | ICD-10-CM

## 2018-05-26 DIAGNOSIS — M79661 Pain in right lower leg: Secondary | ICD-10-CM | POA: Diagnosis not present

## 2018-05-26 DIAGNOSIS — M6281 Muscle weakness (generalized): Secondary | ICD-10-CM | POA: Diagnosis not present

## 2018-05-26 DIAGNOSIS — M25661 Stiffness of right knee, not elsewhere classified: Secondary | ICD-10-CM

## 2018-05-26 DIAGNOSIS — M79662 Pain in left lower leg: Secondary | ICD-10-CM | POA: Diagnosis not present

## 2018-05-26 NOTE — Therapy (Signed)
[facility] [address] [place], [name], [address] Phone: [phone]   Fax:  [phone]  Physical Therapy Treatment  Patient Details  Name: [name] MRN: [id number] Date of Birth: 1932-02-04 Referring Provider (PT): Lamar Blinks, MD   Encounter Date: 05/26/2018  PT End of Session - 05/26/18 0815    Visit Number  2    Number of Visits  25    Date for PT Re-Evaluation  08/17/18    Authorization Type  Medicare 20% coinsurance after meeting deductible    PT Start Time  [phone]   pt running late today   PT Stop Time  0845    PT Time Calculation (min)  34 min    Equipment Utilized During Treatment  Gait belt    Activity Tolerance  Patient tolerated treatment well;No increased pain;Patient limited by fatigue    Behavior During Therapy  [facility] for tasks assessed/performed       Past Medical History:  Diagnosis Date  . Arthritis   . 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 Hypertension   . Osteoporosis 04/15/2017  . PAD (peripheral artery disease) ([place])     Past Surgical History:  Procedure Laterality Date  . ABDOMINAL HYSTERECTOMY     partial  . AMPUTATION Left 01/29/2015   Procedure: Left  AMPUTATION BELOW KNEE;  Surgeon: Elam Dutch, MD;  Location: [place];  Service: Vascular;  Laterality: Left;  . AMPUTATION Right 06/17/2017   Procedure: Right  BELOW KNEE Amputation;  Surgeon: Elam Dutch, MD;  Location: [place];  Service: Vascular;  Laterality: Right;  . KNEE SURGERY Right 1984  . PERIPHERAL VASCULAR CATHETERIZATION N/A 09/15/2014   Procedure: Abdominal Aortogram;  Surgeon: Elam Dutch, MD;  Location: [facility] INVASIVE CV LAB CUPID;  Service: Cardiovascular;  Laterality: N/A;  . PERIPHERAL VASCULAR CATHETERIZATION Bilateral 09/15/2014   Procedure: Lower Extremity Angiography;  Surgeon: Elam Dutch, MD;  Location: [place] INVASIVE CV LAB CUPID;  Service: Cardiovascular;  Laterality: Bilateral;  . SPINE SURGERY       There were no vitals filed for this visit.  Subjective Assessment - 05/26/18 0813    Subjective  No new complaints. Saw Chris after last session and had adjustments made to right prosthesis. Reports it feels better since adjusments made.    Patient is accompained by:  Family member    Pertinent History  B TTTAs, PAD, HTN, arthritis, utertine CA, osteoporosis, Right knee sg 1984, spine sg    Limitations  Standing;Walking;House hold activities    Patient Stated Goals  Be able to walk around house and going out in community in grandson    Currently in Pain?  No/denies    Pain Score  0-No pain              [facility] Adult PT Treatment/Exercise - 05/26/18 0817      Transfers   Transfers  Sit to Stand;Stand to Sit    Sit to Stand  4: Min guard    Sit to Stand Details (indicate cue type and reason)  cues for technique with bil prostheses, weight shifting    Stand to Sit  4: Min guard    Stand to Sit Details  cues for use of arms for slow, controlled descent      Ambulation/Gait   Ambulation/Gait  Yes    Ambulation/Gait Assistance  4: Min assist   with grandson bringing chair behind her   Ambulation/Gait Assistance Details  cues  on posture, bil step length and to increase base of support with gait. may benefit from use of colored bands on walker to assist with step placement.     Ambulation Distance (Feet)  80 Feet   x1, 50 x1   Assistive device  Rolling walker;Prostheses    Gait Pattern  Step-through pattern;Decreased step length - right;Decreased step length - left;Decreased stride length;Right flexed knee in stance;Left flexed knee in stance;Wide base of support    Ambulation Surface  Level;Indoor      Neuro Re-ed    Neuro Re-ed Details   pt educated on and issued sink HEP today for balance/proprioception.      Prosthetics   Current prosthetic wear tolerance (days/week)   daily    Current prosthetic wear tolerance (#hours/day)   6-7 hours 2x day bil prostheses    Residual  limb condition   intact bil limbs with no issues    Donning Prosthesis  Supervision    Doffing Prosthesis  Supervision             PT Education - 05/26/18 0832    Education Details  Sink HEP for balance and proprioception    Person(s) Educated  Patient;Caregiver(s)    Methods  Explanation;Demonstration;Verbal cues;Handout    Comprehension  Verbalized understanding;Returned demonstration;Verbal cues required;Need further instruction       PT Short Term Goals - 05/19/18 2215      PT SHORT TERM GOAL #1   Title  Patient tolerates prostheses wear daily >6hrs with limb pain <6/10. (All STGs Target Date 06/18/2018)    Time  1    Period  Months    Status  New    Target Date  06/18/18      PT SHORT TERM GOAL #2   Title  Sit to/from stand chairs with armrests to RW with supervision.     Time  1    Period  Months    Status  New    Target Date  06/18/18      PT SHORT TERM GOAL #3   Title  Patient ambulates 7' with RW & prostheses with minimal guard.     Time  1    Period  Months    Status  New    Target Date  06/18/18        PT Long Term Goals - 05/19/18 2207      PT LONG TERM GOAL #1   Title  Patient demonstrates / verbalizes safe, independent prosthetic care to enable prosthesis use without issues.  (All LTGs Target Date 08/13/2018)    Time  3    Period  Months    Status  New    Target Date  08/13/18      PT LONG TERM GOAL #2   Title  Patient tolerates wear of prostheses >75% of awake hours without skin issues & limb pain <4/10 to enable function throughout her day.     Time  3    Period  Months    Status  New    Target Date  08/13/18      PT LONG TERM GOAL #3   Title  Standing balance with rolling walker & bilateral prostheses reaching 5" anteriorly & laterally, scans environment with head & upper torso movements and manages clothes modified independent.     Time  3    Period  Months    Status  New    Target Date  08/13/18      PT  LONG TERM GOAL #4   Title   Patient ambulates 45' with RW & bilateral prostheses around furniture modified independent to enable mobility in home.    Time  3    Period  Months    Status  New    Target Date  08/13/18      PT LONG TERM GOAL #5   Title  Patient ambulates 150' with RW & prostheses with grandson's assistance for community mobility.     Time  3    Period  Months    Status  New    Target Date  08/13/18      Additional Long Term Goals   Additional Long Term Goals  Yes      PT LONG TERM GOAL #6   Title  Patient negotiates ramps & curbs with RW & prostheses with grandson's assistance for community access.    Time  3    Period  Months    Status  New    Target Date  08/13/18            Plan - 05/26/18 0815    Clinical Impression Statement  Today's skilled session focused on edcuation on sink HEP and gait with RW/bil prostheses. Improved distance today with less overall assistance needed. May benefit from use of colored bands on bottom of walker to assist wtih step placement. The pt is progressing toward goals and should benefit from continued PT to progress toward unmet goals.     Rehab Potential  Good    PT Frequency  2x / week    PT Duration  12 weeks    PT Treatment/Interventions  ADLs/Self Care Home Management;DME Instruction;Gait training;Stair training;Functional mobility training;Therapeutic activities;Therapeutic exercise;Balance training;Neuromuscular re-education;Patient/family education;Prosthetic Training;Manual techniques;Vestibular    PT Next Visit Plan  continued gait with RW/prostheses (try colored bands to assist with step placement, stair education with bil rails/bil prostheses    Consulted and Agree with Plan of Care  Patient;Family member/caregiver    Family Member Consulted  grandson, [name]       Patient will benefit from skilled therapeutic intervention in order to improve the following deficits and impairments:  Abnormal gait, Decreased activity tolerance, Decreased balance,  Decreased endurance, Decreased knowledge of use of DME, Decreased mobility, Decreased range of motion, Decreased scar mobility, Decreased strength, Dizziness, Impaired flexibility, Postural dysfunction, Prosthetic Dependency  Visit Diagnosis: Abnormal posture  Unsteadiness on feet  Muscle weakness (generalized)  Other abnormalities of gait and mobility  Stiffness of right knee, not elsewhere classified  Stiffness of left knee, not elsewhere classified     Problem List Patient Active Problem List   Diagnosis Date Noted  . Ischemia of right lower extremity 06/19/2017  . Malnutrition of moderate degree 06/16/2017  . Ischemic foot 06/15/2017  . Cellulitis 06/15/2017  . PAD (peripheral artery disease) ([place])   . Osteoporosis 2017/05/07  . Loss or death of child 2015-06-24  . Status post below-knee amputation of left lower extremity ([place]) 01/31/2015  . Essential hypertension 08/22/2014    Willow Ora, PTA, [facility] [address], [place], [place] [address] [phone] 05/26/18, 10:00 AM   Name: [name] MRN: [id number] Date of Birth: 11/18/1931

## 2018-05-26 NOTE — Patient Instructions (Signed)

## 2018-05-28 ENCOUNTER — Ambulatory Visit: Payer: Medicare Other

## 2018-05-28 DIAGNOSIS — M79662 Pain in left lower leg: Secondary | ICD-10-CM | POA: Diagnosis not present

## 2018-05-28 DIAGNOSIS — M79661 Pain in right lower leg: Secondary | ICD-10-CM | POA: Diagnosis not present

## 2018-05-28 DIAGNOSIS — R2681 Unsteadiness on feet: Secondary | ICD-10-CM

## 2018-05-28 DIAGNOSIS — M6281 Muscle weakness (generalized): Secondary | ICD-10-CM | POA: Diagnosis not present

## 2018-05-28 DIAGNOSIS — R293 Abnormal posture: Secondary | ICD-10-CM | POA: Diagnosis not present

## 2018-05-28 DIAGNOSIS — R2689 Other abnormalities of gait and mobility: Secondary | ICD-10-CM | POA: Diagnosis not present

## 2018-05-28 NOTE — Therapy (Signed)
[facility] [address] [place], [name], [address] Phone: [phone]   Fax:  [phone]  Physical Therapy Treatment  Patient Details  Name: [name] MRN: [id number] Date of Birth: 07-12-1931 Referring Provider (PT): Lamar Blinks, MD   Encounter Date: 05/28/2018  PT End of Session - 05/28/18 0937    Visit Number  3    Number of Visits  25    Date for PT Re-Evaluation  08/17/18    Authorization Type  Medicare 20% coinsurance after meeting deductible    PT Start Time  0930    PT Stop Time  1020    PT Time Calculation (min)  50 min    Equipment Utilized During Treatment  Gait belt    Activity Tolerance  Patient tolerated treatment well;No increased pain;Patient limited by fatigue    Behavior During Therapy  [facility] for tasks assessed/performed       Past Medical History:  Diagnosis Date  . Arthritis   . Cancer ([place])    utertrine  . Hypertension   . Osteoporosis 04/15/2017  . PAD (peripheral artery disease) ([place])     Past Surgical History:  Procedure Laterality Date  . ABDOMINAL HYSTERECTOMY     partial  . AMPUTATION Left 01/29/2015   Procedure: Left  AMPUTATION BELOW KNEE;  Surgeon: Elam Dutch, MD;  Location: [facility];  Service: Vascular;  Laterality: Left;  . AMPUTATION Right 06/17/2017   Procedure: Right  BELOW KNEE Amputation;  Surgeon: Elam Dutch, MD;  Location: [facility];  Service: Vascular;  Laterality: Right;  . KNEE SURGERY Right 1984  . PERIPHERAL VASCULAR CATHETERIZATION N/A 09/15/2014   Procedure: Abdominal Aortogram;  Surgeon: Elam Dutch, MD;  Location: [facility] INVASIVE CV LAB CUPID;  Service: Cardiovascular;  Laterality: N/A;  . PERIPHERAL VASCULAR CATHETERIZATION Bilateral 09/15/2014   Procedure: Lower Extremity Angiography;  Surgeon: Elam Dutch, MD;  Location: [facility] INVASIVE CV LAB CUPID;  Service: Cardiovascular;  Laterality: Bilateral;  . SPINE SURGERY      There were no  vitals filed for this visit.  Subjective Assessment - 05/28/18 0935    Subjective  No falls to report, pt reports no issues with sink HEP.     Patient is accompained by:  Family member    Pertinent History  B TTTAs, PAD, HTN, arthritis, utertine CA, osteoporosis, Right knee sg 1984, spine sg    Limitations  Standing;Walking;House hold activities    Patient Stated Goals  Be able to walk around house and going out in community in grandson    Currently in Pain?  No/denies        [facility] Adult PT Treatment/Exercise - 05/28/18 0938      Transfers   Transfers  Sit to Stand;Stand to Sit    Sit to Stand  4: Min guard    Sit to Stand Details (indicate cue type and reason)  Cues for proper hand placement and sequence.     Stand to Sit  4: Min guard    Stand to Sit Details  Cues for controlled descent      Ambulation/Gait   Ambulation/Gait  Yes    Ambulation/Gait Assistance  4: Min guard    Ambulation/Gait Assistance Details  Pt presents with R hip drop during gait and C/O pressure on R ant tib area, right below knee. Adjusted sock ply due to turn out of RLE, decrease in turn out with 1ply added.     Ambulation Distance (Feet)  115 Feet   x2   Assistive device  Rolling walker;Prostheses    Gait Pattern  Step-through pattern;Decreased step length - right;Decreased step length - left;Decreased stride length;Right flexed knee in stance;Left flexed knee in stance;Wide base of support    Ambulation Surface  Level;Indoor      Prosthetics   Current prosthetic wear tolerance (days/week)   daily    Current prosthetic wear tolerance (#hours/day)   Pt continues to report 6-7hrs/2xday and reports no skin issues, feels comfortable with cleaning, adjusting socks, donn/doffing and reports wearing shrinker at night.     Residual limb condition   No skin issues    Donning Prosthesis  Supervision    Doffing Prosthesis  Supervision        PT Education - 05/28/18 1457    Education Details  Eduated pt on  proper sock adjustment.     Person(s) Educated  Patient;Child(ren)    Methods  Explanation    Comprehension  Verbalized understanding;Need further instruction       PT Short Term Goals - 05/19/18 2215      PT SHORT TERM GOAL #1   Title  Patient tolerates prostheses wear daily >6hrs with limb pain <6/10. (All STGs Target Date 06/18/2018)    Time  1    Period  Months    Status  New    Target Date  06/18/18      PT SHORT TERM GOAL #2   Title  Sit to/from stand chairs with armrests to RW with supervision.     Time  1    Period  Months    Status  New    Target Date  06/18/18      PT SHORT TERM GOAL #3   Title  Patient ambulates 27' with RW & prostheses with minimal guard.     Time  1    Period  Months    Status  New    Target Date  06/18/18        PT Long Term Goals - 05/19/18 2207      PT LONG TERM GOAL #1   Title  Patient demonstrates / verbalizes safe, independent prosthetic care to enable prosthesis use without issues.  (All LTGs Target Date 08/13/2018)    Time  3    Period  Months    Status  New    Target Date  08/13/18      PT LONG TERM GOAL #2   Title  Patient tolerates wear of prostheses >75% of awake hours without skin issues & limb pain <4/10 to enable function throughout her day.     Time  3    Period  Months    Status  New    Target Date  08/13/18      PT LONG TERM GOAL #3   Title  Standing balance with rolling walker & bilateral prostheses reaching 5" anteriorly & laterally, scans environment with head & upper torso movements and manages clothes modified independent.     Time  3    Period  Months    Status  New    Target Date  08/13/18      PT LONG TERM GOAL #4   Title  Patient ambulates 58' with RW & bilateral prostheses around furniture modified independent to enable mobility in home.    Time  3    Period  Months    Status  New    Target Date  08/13/18  PT LONG TERM GOAL #5   Title  Patient ambulates 150' with RW & prostheses with grandson's  assistance for community mobility.     Time  3    Period  Months    Status  New    Target Date  08/13/18      Additional Long Term Goals   Additional Long Term Goals  Yes      PT LONG TERM GOAL #6   Title  Patient negotiates ramps & curbs with RW & prostheses with grandson's assistance for community access.    Time  3    Period  Months    Status  New    Target Date  08/13/18         Plan - 05/28/18 1511    Clinical Impression Statement  Todays skilled session focused on proper STS transfers from W/C to RW, gait training with RW while attempting to increase BOS with theraband for BLE placement and prosthetic care/sock adustment. Pt should benefit from continued PT sessions to progress towards goals.     Rehab Potential  Good    PT Frequency  2x / week    PT Duration  12 weeks    PT Treatment/Interventions  ADLs/Self Care Home Management;DME Instruction;Gait training;Stair training;Functional mobility training;Therapeutic activities;Therapeutic exercise;Balance training;Neuromuscular re-education;Patient/family education;Prosthetic Training;Manual techniques;Vestibular    PT Next Visit Plan  continued gait with RW/prostheses (try colored bands to assist with step placement, stair education with bil rails/bil prostheses)    Consulted and Agree with Plan of Care  Patient;Family member/caregiver    Family Member Consulted  grandson, [name]       Patient will benefit from skilled therapeutic intervention in order to improve the following deficits and impairments:  Abnormal gait, Decreased activity tolerance, Decreased balance, Decreased endurance, Decreased knowledge of use of DME, Decreased mobility, Decreased range of motion, Decreased scar mobility, Decreased strength, Dizziness, Impaired flexibility, Postural dysfunction, Prosthetic Dependency  Visit Diagnosis: Abnormal posture  Unsteadiness on feet  Muscle weakness (generalized)     Problem List Patient Active Problem List    Diagnosis Date Noted  . Ischemia of right lower extremity 06/19/2017  . Malnutrition of moderate degree 06/16/2017  . Ischemic foot 06/15/2017  . Cellulitis 06/15/2017  . PAD (peripheral artery disease) ([place])   . Osteoporosis 05/02/2017  . Loss or death of child 06/19/2015  . Status post below-knee amputation of left lower extremity ([place]) 01/31/2015  . Essential hypertension 08/22/2014   Chassity Felts, PTA  Chassity A Felts 05/28/2018, 3:17 PM  [facility] [address] [place], [name], [address] Phone: [phone]   Fax:  [phone]  Name: [name] MRN: [id number] Date of Birth: December 16, 1931

## 2018-05-31 ENCOUNTER — Ambulatory Visit: Payer: Medicare Other | Admitting: Physical Therapy

## 2018-05-31 ENCOUNTER — Encounter: Payer: Self-pay | Admitting: Physical Therapy

## 2018-05-31 DIAGNOSIS — M6281 Muscle weakness (generalized): Secondary | ICD-10-CM | POA: Diagnosis not present

## 2018-05-31 DIAGNOSIS — R2681 Unsteadiness on feet: Secondary | ICD-10-CM

## 2018-05-31 DIAGNOSIS — R293 Abnormal posture: Secondary | ICD-10-CM

## 2018-05-31 DIAGNOSIS — R2689 Other abnormalities of gait and mobility: Secondary | ICD-10-CM | POA: Diagnosis not present

## 2018-05-31 DIAGNOSIS — M79662 Pain in left lower leg: Secondary | ICD-10-CM | POA: Diagnosis not present

## 2018-05-31 DIAGNOSIS — M79661 Pain in right lower leg: Secondary | ICD-10-CM | POA: Diagnosis not present

## 2018-05-31 NOTE — Therapy (Signed)
[facility] [address] [place], [name], [address] Phone: [phone]   Fax:  [phone]  Physical Therapy Treatment  Patient Details  Name: [name] MRN: [id number] Date of Birth: Apr 21, 1932 Referring Provider (PT): Lamar Blinks, MD   Encounter Date: 05/31/2018  PT End of Session - 05/31/18 0932    Visit Number  4    Number of Visits  25    Date for PT Re-Evaluation  08/17/18    Authorization Type  Medicare 20% coinsurance after meeting deductible    PT Start Time  0846    PT Stop Time  0927    PT Time Calculation (min)  41 min    Equipment Utilized During Treatment  Gait belt    Activity Tolerance  Patient tolerated treatment well;No increased pain;Patient limited by fatigue    Behavior During Therapy  [facility] for tasks assessed/performed       Past Medical History:  Diagnosis Date  . Arthritis   . Cancer ([place])    utertrine  . Hypertension   . Osteoporosis 04/15/2017  . PAD (peripheral artery disease) ([place])     Past Surgical History:  Procedure Laterality Date  . ABDOMINAL HYSTERECTOMY     partial  . AMPUTATION Left 01/29/2015   Procedure: Left  AMPUTATION BELOW KNEE;  Surgeon: Elam Dutch, MD;  Location: [facility];  Service: Vascular;  Laterality: Left;  . AMPUTATION Right 06/17/2017   Procedure: Right  BELOW KNEE Amputation;  Surgeon: Elam Dutch, MD;  Location: [facility];  Service: Vascular;  Laterality: Right;  . KNEE SURGERY Right 1984  . PERIPHERAL VASCULAR CATHETERIZATION N/A 09/15/2014   Procedure: Abdominal Aortogram;  Surgeon: Elam Dutch, MD;  Location: [facility] INVASIVE CV LAB CUPID;  Service: Cardiovascular;  Laterality: N/A;  . PERIPHERAL VASCULAR CATHETERIZATION Bilateral 09/15/2014   Procedure: Lower Extremity Angiography;  Surgeon: Elam Dutch, MD;  Location: [facility] INVASIVE CV LAB CUPID;  Service: Cardiovascular;  Laterality: Bilateral;  . SPINE SURGERY      There were no  vitals filed for this visit.  Subjective Assessment - 05/31/18 0847    Subjective  No falls to report. Pt reports continuing to work on [employer].     Patient is accompained by:  Family member    Pertinent History  B TTTAs, PAD, HTN, arthritis, utertine CA, osteoporosis, Right knee sg 1984, spine sg    Limitations  Standing;Walking;House hold activities    Patient Stated Goals  Be able to walk around house and going out in community in grandson    Currently in Pain?  No/denies                       [facility] Adult PT Treatment/Exercise - 05/31/18 0001      Transfers   Transfers  Sit to Stand;Stand to Sit    Sit to Stand  4: Min guard    Sit to Stand Details (indicate cue type and reason)  cues for proper scooting forward and weight shifting sit>stand    Stand to Sit  4: Min guard    Stand to Sit Details  cues for controlled descent.      Ambulation/Gait   Ambulation/Gait  Yes    Ambulation/Gait Assistance  4: Min guard    Ambulation/Gait Assistance Details  working on proper step length and width, initial heel strike, and 180* turns    Ambulation Distance (Feet)  100 Feet   30x4  Assistive device  Rolling walker;Prostheses    Gait Pattern  Step-through pattern;Decreased step length - right;Decreased step length - left;Decreased stride length;Right flexed knee in stance;Left flexed knee in stance;Wide base of support    Ambulation Surface  Level;Indoor    Pre-Gait Activities  in parallel bars: working on step length and width      Prosthetics   Prosthetic Care Comments   Family training to work on short gait distances and transfers. Pt went from 6 to 7 ply during session; pt noting prosthesis was loose and rotating.                                                             Current prosthetic wear tolerance (days/week)   daily    Current prosthetic wear tolerance (#hours/day)   almost all awake hours.    Residual limb condition   No skin issues    Education Provided  Other  (comment);Correct ply sock adjustment   gait with RW   Person(s) Educated  Patient;Other (comment)   grandson.   Donning Prosthesis  Supervision    Doffing Prosthesis  Supervision          Balance Exercises - 05/31/18 1242      Balance Exercises: Standing   Standing Eyes Opened  Wide (BOA)   weight shifting side<>side, Ant/Post. with UE support       PT Education - 05/31/18 1246    Education Details  see prosthetic training.       PT Short Term Goals - 05/19/18 2215      PT SHORT TERM GOAL #1   Title  Patient tolerates prostheses wear daily >6hrs with limb pain <6/10. (All STGs Target Date 06/18/2018)    Time  1    Period  Months    Status  New    Target Date  06/18/18      PT SHORT TERM GOAL #2   Title  Sit to/from stand chairs with armrests to RW with supervision.     Time  1    Period  Months    Status  New    Target Date  06/18/18      PT SHORT TERM GOAL #3   Title  Patient ambulates 73' with RW & prostheses with minimal guard.     Time  1    Period  Months    Status  New    Target Date  06/18/18        PT Long Term Goals - 05/19/18 2207      PT LONG TERM GOAL #1   Title  Patient demonstrates / verbalizes safe, independent prosthetic care to enable prosthesis use without issues.  (All LTGs Target Date 08/13/2018)    Time  3    Period  Months    Status  New    Target Date  08/13/18      PT LONG TERM GOAL #2   Title  Patient tolerates wear of prostheses >75% of awake hours without skin issues & limb pain <4/10 to enable function throughout her day.     Time  3    Period  Months    Status  New    Target Date  08/13/18      PT LONG TERM GOAL #3  Title  Standing balance with rolling walker & bilateral prostheses reaching 5" anteriorly & laterally, scans environment with head & upper torso movements and manages clothes modified independent.     Time  3    Period  Months    Status  New    Target Date  08/13/18      PT LONG TERM GOAL #4   Title   Patient ambulates 71' with RW & bilateral prostheses around furniture modified independent to enable mobility in home.    Time  3    Period  Months    Status  New    Target Date  08/13/18      PT LONG TERM GOAL #5   Title  Patient ambulates 150' with RW & prostheses with grandson's assistance for community mobility.     Time  3    Period  Months    Status  New    Target Date  08/13/18      Additional Long Term Goals   Additional Long Term Goals  Yes      PT LONG TERM GOAL #6   Title  Patient negotiates ramps & curbs with RW & prostheses with grandson's assistance for community access.    Time  3    Period  Months    Status  New    Target Date  08/13/18            Plan - 05/31/18 1246    Clinical Impression Statement  Skilled session focused on family training for transfers and short walking distances so pt can work on at home with grandson.  Pt demonstrates increasing awareness of prostheses placement during gait with RW, requiring min guard.  Pt continues to require cues for transfer sequencing and min guard to steady.                                                                  Rehab Potential  Good    PT Frequency  2x / week    PT Duration  12 weeks    PT Treatment/Interventions  ADLs/Self Care Home Management;DME Instruction;Gait training;Stair training;Functional mobility training;Therapeutic activities;Therapeutic exercise;Balance training;Neuromuscular re-education;Patient/family education;Prosthetic Training;Manual techniques;Vestibular    PT Next Visit Plan  continued gait with RW/prostheses (try colored bands to assist with step placement, stair education with bil rails/bil prostheses)    Consulted and Agree with Plan of Care  Patient;Family member/caregiver    Family Member Consulted  grandson, [name]       Patient will benefit from skilled therapeutic intervention in order to improve the following deficits and impairments:  Abnormal gait, Decreased activity  tolerance, Decreased balance, Decreased endurance, Decreased knowledge of use of DME, Decreased mobility, Decreased range of motion, Decreased scar mobility, Decreased strength, Dizziness, Impaired flexibility, Postural dysfunction, Prosthetic Dependency  Visit Diagnosis: Abnormal posture  Unsteadiness on feet  Muscle weakness (generalized)     Problem List Patient Active Problem List   Diagnosis Date Noted  . Ischemia of right lower extremity 06/19/2017  . Malnutrition of moderate degree 06/16/2017  . Ischemic foot 06/15/2017  . Cellulitis 06/15/2017  . PAD (peripheral artery disease) ([place])   . Osteoporosis 11-May-2017  . Loss or death of child 2015-06-28  . Status post below-knee amputation of left  lower extremity ([place]) 01/31/2015  . Essential hypertension 08/22/2014    Bjorn Loser, PTA  05/31/18, 12:51 PM [facility] [address] [place], [name], [address] Phone: [phone]   Fax:  [phone]  Name: DANICE DIPPOLITO MRN: [id number] Date of Birth: October 27, 1931

## 2018-06-01 ENCOUNTER — Encounter: Payer: Medicare Other | Admitting: Physical Therapy

## 2018-06-03 ENCOUNTER — Ambulatory Visit: Payer: Medicare Other | Admitting: Physical Therapy

## 2018-06-03 ENCOUNTER — Encounter: Payer: Medicare Other | Admitting: Physical Therapy

## 2018-06-07 ENCOUNTER — Encounter: Payer: Self-pay | Admitting: Family Medicine

## 2018-06-07 ENCOUNTER — Encounter: Payer: Medicare Other | Admitting: Physical Therapy

## 2018-06-07 ENCOUNTER — Encounter: Payer: Self-pay | Admitting: Physical Therapy

## 2018-06-07 ENCOUNTER — Ambulatory Visit: Payer: Medicare Other | Admitting: Physical Therapy

## 2018-06-07 DIAGNOSIS — R293 Abnormal posture: Secondary | ICD-10-CM | POA: Diagnosis not present

## 2018-06-07 DIAGNOSIS — M79661 Pain in right lower leg: Secondary | ICD-10-CM | POA: Diagnosis not present

## 2018-06-07 DIAGNOSIS — M6281 Muscle weakness (generalized): Secondary | ICD-10-CM

## 2018-06-07 DIAGNOSIS — R2689 Other abnormalities of gait and mobility: Secondary | ICD-10-CM | POA: Diagnosis not present

## 2018-06-07 DIAGNOSIS — M79662 Pain in left lower leg: Secondary | ICD-10-CM | POA: Diagnosis not present

## 2018-06-07 DIAGNOSIS — R2681 Unsteadiness on feet: Secondary | ICD-10-CM | POA: Diagnosis not present

## 2018-06-08 NOTE — Therapy (Signed)
[facility] [address] [place], [name], [address] Phone: [phone]   Fax:  [phone]  Physical Therapy Treatment  Patient Details  Name: [name] MRN: [id number] Date of Birth: Aug 15, 1931 Referring Provider (PT): Lamar Blinks, MD   Encounter Date: 06/07/2018  PT End of Session - 06/07/18 0855    Visit Number  5    Number of Visits  25    Date for PT Re-Evaluation  08/17/18    Authorization Type  Medicare 20% coinsurance after meeting deductible    PT Start Time  [phone]    PT Stop Time  0930    PT Time Calculation (min)  40 min    Equipment Utilized During Treatment  Gait belt    Activity Tolerance  Patient tolerated treatment well    Behavior During Therapy  [name] for tasks assessed/performed       Past Medical History:  Diagnosis Date  . Arthritis   . Cancer ([place])    utertrine  . Hypertension   . Osteoporosis 04/15/2017  . PAD (peripheral artery disease) ([place])     Past Surgical History:  Procedure Laterality Date  . ABDOMINAL HYSTERECTOMY     partial  . AMPUTATION Left 01/29/2015   Procedure: Left  AMPUTATION BELOW KNEE;  Surgeon: Elam Dutch, MD;  Location: [facility];  Service: Vascular;  Laterality: Left;  . AMPUTATION Right 06/17/2017   Procedure: Right  BELOW KNEE Amputation;  Surgeon: Elam Dutch, MD;  Location: [facility];  Service: Vascular;  Laterality: Right;  . KNEE SURGERY Right 1984  . PERIPHERAL VASCULAR CATHETERIZATION N/A 09/15/2014   Procedure: Abdominal Aortogram;  Surgeon: Elam Dutch, MD;  Location: [facility] INVASIVE CV LAB CUPID;  Service: Cardiovascular;  Laterality: N/A;  . PERIPHERAL VASCULAR CATHETERIZATION Bilateral 09/15/2014   Procedure: Lower Extremity Angiography;  Surgeon: Elam Dutch, MD;  Location: [facility] INVASIVE CV LAB CUPID;  Service: Cardiovascular;  Laterality: Bilateral;  . SPINE SURGERY      There were no vitals filed for this visit.  Subjective  Assessment - 06/07/18 0854    Subjective  No new complaints. No falls or pain.     Patient is accompained by:  Family member    Pertinent History  B TTTAs, PAD, HTN, arthritis, utertine CA, osteoporosis, Right knee sg 1984, spine sg    Limitations  Standing;Walking;House hold activities    Patient Stated Goals  Be able to walk around house and going out in community in grandson    Currently in Pain?  No/denies    Pain Score  0-No pain            [facility] Adult PT Treatment/Exercise - 06/07/18 0856      Transfers   Transfers  Sit to Stand;Stand to Sit    Sit to Stand  4: Min guard;With upper extremity assist;From chair/3-in-1    Sit to Stand Details (indicate cue type and reason)  cues for weight shifting. pt demo's good technique with walking prostheses back upon standing to stabilize    Stand to Sit  4: Min guard;With upper extremity assist;To chair/3-in-1    Stand to Sit Details  cues for use of arms for controlled descent      Ambulation/Gait   Ambulation/Gait  Yes    Ambulation/Gait Assistance  4: Min guard;4: Min assist    Ambulation/Gait Assistance Details  use of colored band at bottom of RW for step placement (right to red, left to yellow).  cues for upright posture as well.     Ambulation Distance (Feet)  115 Feet   x1, plus around gym with barriers   Assistive device  Rolling walker;Prostheses    Gait Pattern  Step-through pattern;Decreased step length - right;Decreased step length - left;Decreased stride length;Right flexed knee in stance;Left flexed knee in stance;Wide base of support    Ambulation Surface  Level;Indoor    Stairs  Yes    Stairs Assistance  4: Min guard;4: Min assist    Stairs Assistance Details (indicate cue type and reason)  PTA demo'd technique prior to pt performance. 1st rep with bil rails, cues on sequencing and technique to alternate lead leg and for foot placement with descending. 2cd rep and 3rd rep with single rail sideways x 1 rep with each rail,  cues on sequencing/technique and stance position for balance. min guard to min assist for balance with 2cd person for safety due to 1st time on stairs.     Stair Management Technique  Two rails;One rail Left;One rail Right;Step to pattern;Sideways;Forwards    Number of Stairs  4   x3 reps   Height of Stairs  6      Prosthetics   Prosthetic Care Comments   Pt continues to need to increase socks during sessions. Discussed process for socket revision and that 1st step is to obtain an MD order for the socket revision. Also that with her increasing her activity the limb is going to shrink more and it is best to wait as long as possible before socket revision to allow limb to shrink as much as possible. Pt and grandson verbalized understanding.     Current prosthetic wear tolerance (days/week)   daily    Current prosthetic wear tolerance (#hours/day)   almost all awake hours.    Residual limb condition   No skin issues    Donning Prosthesis  Supervision    Doffing Prosthesis  Supervision           PT Short Term Goals - 05/19/18 2215      PT SHORT TERM GOAL #1   Title  Patient tolerates prostheses wear daily >6hrs with limb pain <6/10. (All STGs Target Date 06/18/2018)    Time  1    Period  Months    Status  New    Target Date  06/18/18      PT SHORT TERM GOAL #2   Title  Sit to/from stand chairs with armrests to RW with supervision.     Time  1    Period  Months    Status  New    Target Date  06/18/18      PT SHORT TERM GOAL #3   Title  Patient ambulates 87' with RW & prostheses with minimal guard.     Time  1    Period  Months    Status  New    Target Date  06/18/18        PT Long Term Goals - 05/19/18 2207      PT LONG TERM GOAL #1   Title  Patient demonstrates / verbalizes safe, independent prosthetic care to enable prosthesis use without issues.  (All LTGs Target Date 08/13/2018)    Time  3    Period  Months    Status  New    Target Date  08/13/18      PT LONG TERM  GOAL #2   Title  Patient tolerates wear of prostheses >75% of awake  hours without skin issues & limb pain <4/10 to enable function throughout her day.     Time  3    Period  Months    Status  New    Target Date  08/13/18      PT LONG TERM GOAL #3   Title  Standing balance with rolling walker & bilateral prostheses reaching 5" anteriorly & laterally, scans environment with head & upper torso movements and manages clothes modified independent.     Time  3    Period  Months    Status  New    Target Date  08/13/18      PT LONG TERM GOAL #4   Title  Patient ambulates 57' with RW & bilateral prostheses around furniture modified independent to enable mobility in home.    Time  3    Period  Months    Status  New    Target Date  08/13/18      PT LONG TERM GOAL #5   Title  Patient ambulates 150' with RW & prostheses with grandson's assistance for community mobility.     Time  3    Period  Months    Status  New    Target Date  08/13/18      Additional Long Term Goals   Additional Long Term Goals  Yes      PT LONG TERM GOAL #6   Title  Patient negotiates ramps & curbs with RW & prostheses with grandson's assistance for community access.    Time  3    Period  Months    Status  New    Target Date  08/13/18            Plan - 06/07/18 0856    Clinical Impression Statement  Today's skilled session continued to focus on gait with bil prostheses and began to address stairs with bil prostheses. The pt is progressing toward goals and should benefit from continued PT to progress toward unmet goals.     Rehab Potential  Good    PT Frequency  2x / week    PT Duration  12 weeks    PT Treatment/Interventions  ADLs/Self Care Home Management;DME Instruction;Gait training;Stair training;Functional mobility training;Therapeutic activities;Therapeutic exercise;Balance training;Neuromuscular re-education;Patient/family education;Prosthetic Training;Manual techniques;Vestibular    PT Next Visit  Plan  continued gait with RW/prostheses with use of colored bands at bottom (right to red, left to yellow), continue with stairs, introduce ramp/curb as able     Consulted and Agree with Plan of Care  Patient;Family member/caregiver    Family Member Consulted  grandson, [name]       Patient will benefit from skilled therapeutic intervention in order to improve the following deficits and impairments:  Abnormal gait, Decreased activity tolerance, Decreased balance, Decreased endurance, Decreased knowledge of use of DME, Decreased mobility, Decreased range of motion, Decreased scar mobility, Decreased strength, Dizziness, Impaired flexibility, Postural dysfunction, Prosthetic Dependency  Visit Diagnosis: Abnormal posture  Unsteadiness on feet  Muscle weakness (generalized)  Other abnormalities of gait and mobility     Problem List Patient Active Problem List   Diagnosis Date Noted  . Ischemia of right lower extremity 06/19/2017  . Malnutrition of moderate degree 06/16/2017  . Ischemic foot 06/15/2017  . Cellulitis 06/15/2017  . PAD (peripheral artery disease) ([place])   . Osteoporosis 2017-05-15  . Loss or death of child 2015/07/02  . Status post below-knee amputation of left lower extremity ([place]) 01/31/2015  . Essential hypertension 08/22/2014  Willow Ora, PTA, [facility] [address], [place], [place] [address] [phone] 06/08/18, 8:34 AM   Name: [name] MRN: [id number] Date of Birth: 12-28-1931

## 2018-06-09 ENCOUNTER — Other Ambulatory Visit: Payer: Self-pay | Admitting: Family Medicine

## 2018-06-09 DIAGNOSIS — I739 Peripheral vascular disease, unspecified: Secondary | ICD-10-CM

## 2018-06-09 NOTE — Telephone Encounter (Signed)
Called and was able to speak with Gerald Stabs-  Pt needs a left socket replacement, it is too large.  She needs also liners and supplies   She is motivated to use these items  Fax order now

## 2018-06-10 ENCOUNTER — Other Ambulatory Visit: Payer: Self-pay | Admitting: Family Medicine

## 2018-06-10 ENCOUNTER — Ambulatory Visit: Payer: Medicare Other | Admitting: Physical Therapy

## 2018-06-10 ENCOUNTER — Encounter: Payer: Medicare Other | Admitting: Physical Therapy

## 2018-06-10 ENCOUNTER — Encounter: Payer: Self-pay | Admitting: Physical Therapy

## 2018-06-10 DIAGNOSIS — R293 Abnormal posture: Secondary | ICD-10-CM | POA: Diagnosis not present

## 2018-06-10 DIAGNOSIS — R2689 Other abnormalities of gait and mobility: Secondary | ICD-10-CM

## 2018-06-10 DIAGNOSIS — M6281 Muscle weakness (generalized): Secondary | ICD-10-CM | POA: Diagnosis not present

## 2018-06-10 DIAGNOSIS — M79662 Pain in left lower leg: Secondary | ICD-10-CM | POA: Diagnosis not present

## 2018-06-10 DIAGNOSIS — R2681 Unsteadiness on feet: Secondary | ICD-10-CM | POA: Diagnosis not present

## 2018-06-10 DIAGNOSIS — M79661 Pain in right lower leg: Secondary | ICD-10-CM | POA: Diagnosis not present

## 2018-06-10 DIAGNOSIS — I10 Essential (primary) hypertension: Secondary | ICD-10-CM

## 2018-06-11 NOTE — Therapy (Signed)
[facility] [address] [place], [name], [address] Phone: [phone]   Fax:  [phone]  Physical Therapy Treatment  Patient Details  Name: [name] MRN: [id number] Date of Birth: 11-25-1931 Referring Provider (PT): Lamar Blinks, MD   Encounter Date: 06/10/2018  PT End of Session - 06/10/18 0852    Visit Number  6    Number of Visits  25    Date for PT Re-Evaluation  08/17/18    Authorization Type  Medicare 20% coinsurance after meeting deductible    PT Start Time  0848    PT Stop Time  0930    PT Time Calculation (min)  42 min    Equipment Utilized During Treatment  Gait belt    Activity Tolerance  --    Behavior During Therapy  WFL for tasks assessed/performed       Past Medical History:  Diagnosis Date  . Arthritis   . Cancer ([place])    utertrine  . Hypertension   . Osteoporosis 04/15/2017  . PAD (peripheral artery disease) ([place])     Past Surgical History:  Procedure Laterality Date  . ABDOMINAL HYSTERECTOMY     partial  . AMPUTATION Left 01/29/2015   Procedure: Left  AMPUTATION BELOW KNEE;  Surgeon: Elam Dutch, MD;  Location: [facility];  Service: Vascular;  Laterality: Left;  . AMPUTATION Right 06/17/2017   Procedure: Right  BELOW KNEE Amputation;  Surgeon: Elam Dutch, MD;  Location: [facility];  Service: Vascular;  Laterality: Right;  . KNEE SURGERY Right 1984  . PERIPHERAL VASCULAR CATHETERIZATION N/A 09/15/2014   Procedure: Abdominal Aortogram;  Surgeon: Elam Dutch, MD;  Location: [facility] INVASIVE CV LAB CUPID;  Service: Cardiovascular;  Laterality: N/A;  . PERIPHERAL VASCULAR CATHETERIZATION Bilateral 09/15/2014   Procedure: Lower Extremity Angiography;  Surgeon: Elam Dutch, MD;  Location: [facility] INVASIVE CV LAB CUPID;  Service: Cardiovascular;  Laterality: Bilateral;  . SPINE SURGERY      There were no vitals filed for this visit.  Subjective Assessment - 06/10/18 0851     Subjective  No new complaints. No falls or pain to report. Does have a script for new socket. Waiting on [facility] to call and schedule.     Patient is accompained by:  Family member    Pertinent History  B TTTAs, PAD, HTN, arthritis, utertine CA, osteoporosis, Right knee sg 1984, spine sg    Limitations  Standing;Walking;House hold activities    Patient Stated Goals  Be able to walk around house and going out in community in grandson    Currently in Pain?  No/denies    Pain Score  0-No pain            [facility] Adult PT Treatment/Exercise - 06/10/18 0853      Transfers   Transfers  Sit to Stand;Stand to Sit;Stand Pivot Transfers    Sit to Stand  5: Supervision;With upper extremity assist;From chair/3-in-1    Sit to Stand Details (indicate cue type and reason)  needs UE support for stability with standing    Stand to Sit  5: Supervision;With upper extremity assist;To chair/3-in-1    Stand to Sit Details  needs UE support with sitting down    Stand Pivot Transfers  4: Min guard    Stand Pivot Transfer Details (indicate cue type and reason)  with RW/prostheses stand pivot transfer transport chair to/from Nustep with cues on posture and sequencing.  Ambulation/Gait   Ambulation/Gait  Yes    Ambulation/Gait Assistance  4: Min guard    Ambulation/Gait Assistance Details  did not use bands today with good step placement noted.  continues to need cues for posture and walker position with gait.         Ambulation Distance (Feet)  115 Feet   x2 reps   Assistive device  Rolling walker;Prostheses    Gait Pattern  Step-through pattern;Decreased step length - right;Decreased step length - left;Decreased stride length;Right flexed knee in stance;Left flexed knee in stance;Wide base of support    Ambulation Surface  Level;Indoor    Stairs  Yes    Stairs Assistance  4: Min guard;4: Min assist    Stairs Assistance Details (indicate cue type and reason)  1st rep with bil rails, reminder cues on  posture, sequencing and weight shifitng. 2cd and 3rd rep with single rail (1 with right, 1 with left)    Stair Management Technique  Two rails;One rail Left;One rail Right;Step to pattern;Sideways;Forwards    Number of Stairs  4   x 3 reps   Height of Stairs  6      Knee/Hip Exercises: Aerobic   Nustep  Level 3 with UE/LE's for 8 minutes with goal >/= 60 steps per minute for strengthening and activity tolerance.       Prosthetics   Current prosthetic wear tolerance (days/week)   daily    Current prosthetic wear tolerance (#hours/day)   almost all awake hours.    Residual limb condition   No skin issues               PT Short Term Goals - 05/19/18 2215      PT SHORT TERM GOAL #1   Title  Patient tolerates prostheses wear daily >6hrs with limb pain <6/10. (All STGs Target Date 06/18/2018)    Time  1    Period  Months    Status  New    Target Date  06/18/18      PT SHORT TERM GOAL #2   Title  Sit to/from stand chairs with armrests to RW with supervision.     Time  1    Period  Months    Status  New    Target Date  06/18/18      PT SHORT TERM GOAL #3   Title  Patient ambulates 45' with RW & prostheses with minimal guard.     Time  1    Period  Months    Status  New    Target Date  06/18/18        PT Long Term Goals - 05/19/18 2207      PT LONG TERM GOAL #1   Title  Patient demonstrates / verbalizes safe, independent prosthetic care to enable prosthesis use without issues.  (All LTGs Target Date 08/13/2018)    Time  3    Period  Months    Status  New    Target Date  08/13/18      PT LONG TERM GOAL #2   Title  Patient tolerates wear of prostheses >75% of awake hours without skin issues & limb pain <4/10 to enable function throughout her day.     Time  3    Period  Months    Status  New    Target Date  08/13/18      PT LONG TERM GOAL #3   Title  Standing balance with rolling walker & bilateral  prostheses reaching 5" anteriorly & laterally, scans environment  with head & upper torso movements and manages clothes modified independent.     Time  3    Period  Months    Status  New    Target Date  08/13/18      PT LONG TERM GOAL #4   Title  Patient ambulates 21' with RW & bilateral prostheses around furniture modified independent to enable mobility in home.    Time  3    Period  Months    Status  New    Target Date  08/13/18      PT LONG TERM GOAL #5   Title  Patient ambulates 150' with RW & prostheses with grandson's assistance for community mobility.     Time  3    Period  Months    Status  New    Target Date  08/13/18      Additional Long Term Goals   Additional Long Term Goals  Yes      PT LONG TERM GOAL #6   Title  Patient negotiates ramps & curbs with RW & prostheses with grandson's assistance for community access.    Time  3    Period  Months    Status  New    Target Date  08/13/18            Plan - 06/10/18 9381    Clinical Impression Statement  Today's skilled session continued to focus on gait/stairs with prostheses using RW/rails with improved technique and less overall assistance needed. Also began to address strenghtening adn actitvity tolerance. The pt si progressing toward goals and should benefit from continued PT to progress toward unmet goals.     Rehab Potential  Good    PT Frequency  2x / week    PT Duration  12 weeks    PT Treatment/Interventions  ADLs/Self Care Home Management;DME Instruction;Gait training;Stair training;Functional mobility training;Therapeutic activities;Therapeutic exercise;Balance training;Neuromuscular re-education;Patient/family education;Prosthetic Training;Manual techniques;Vestibular    PT Next Visit Plan  continued gait with RW/prostheses with use of colored bands at bottom (right to red, left to yellow), continue with stairs, introduce ramp/curb as able     Consulted and Agree with Plan of Care  Patient;Family member/caregiver    Family Member Consulted  grandson, [name]        Patient will benefit from skilled therapeutic intervention in order to improve the following deficits and impairments:  Abnormal gait, Decreased activity tolerance, Decreased balance, Decreased endurance, Decreased knowledge of use of DME, Decreased mobility, Decreased range of motion, Decreased scar mobility, Decreased strength, Dizziness, Impaired flexibility, Postural dysfunction, Prosthetic Dependency  Visit Diagnosis: Abnormal posture  Unsteadiness on feet  Muscle weakness (generalized)  Other abnormalities of gait and mobility     Problem List Patient Active Problem List   Diagnosis Date Noted  . Ischemia of right lower extremity 06/19/2017  . Malnutrition of moderate degree 06/16/2017  . Ischemic foot 06/15/2017  . Cellulitis 06/15/2017  . PAD (peripheral artery disease) ([place])   . Osteoporosis 2017-05-13  . Loss or death of child Jun 30, 2015  . Status post below-knee amputation of left lower extremity ([place]) 01/31/2015  . Essential hypertension 08/22/2014    Willow Ora, PTA, [facility] [address] [place], [place] [address] [phone] 06/11/18, 1:16 PM   Name: [name] MRN: [id number] Date of Birth: 1931/05/21

## 2018-06-15 ENCOUNTER — Encounter: Payer: Self-pay | Admitting: Physical Therapy

## 2018-06-15 ENCOUNTER — Ambulatory Visit: Payer: Medicare Other | Attending: Family Medicine | Admitting: Physical Therapy

## 2018-06-15 DIAGNOSIS — M25661 Stiffness of right knee, not elsewhere classified: Secondary | ICD-10-CM | POA: Diagnosis not present

## 2018-06-15 DIAGNOSIS — M6281 Muscle weakness (generalized): Secondary | ICD-10-CM | POA: Insufficient documentation

## 2018-06-15 DIAGNOSIS — R2689 Other abnormalities of gait and mobility: Secondary | ICD-10-CM | POA: Diagnosis not present

## 2018-06-15 DIAGNOSIS — R2681 Unsteadiness on feet: Secondary | ICD-10-CM | POA: Diagnosis not present

## 2018-06-15 DIAGNOSIS — M25662 Stiffness of left knee, not elsewhere classified: Secondary | ICD-10-CM | POA: Insufficient documentation

## 2018-06-15 DIAGNOSIS — R293 Abnormal posture: Secondary | ICD-10-CM | POA: Diagnosis not present

## 2018-06-15 NOTE — Therapy (Addendum)
[facility] [address] [place], [name], [address] Phone: [phone]   Fax:  [phone]  Physical Therapy Treatment  Patient Details  Name: [name] MRN: [id number] Date of Birth: 1932-04-25 Referring Provider (PT): Lamar Blinks, MD   Encounter Date: 06/15/2018  PT End of Session - 06/15/18 9030    Visit Number  7    Number of Visits  25    Date for PT Re-Evaluation  08/17/18    Authorization Type  Medicare 20% coinsurance after meeting deductible    PT Start Time  0933    PT Stop Time  1014    PT Time Calculation (min)  41 min    Equipment Utilized During Treatment  Gait belt    Activity Tolerance  Patient tolerated treatment well    Behavior During Therapy  [facility] for tasks assessed/performed       Past Medical History:  Diagnosis Date  . Arthritis   . Cancer ([place])    utertrine  . Hypertension   . Osteoporosis 04/15/2017  . PAD (peripheral artery disease) ([place])     Past Surgical History:  Procedure Laterality Date  . ABDOMINAL HYSTERECTOMY     partial  . AMPUTATION Left 01/29/2015   Procedure: Left  AMPUTATION BELOW KNEE;  Surgeon: Elam Dutch, MD;  Location: [facility];  Service: Vascular;  Laterality: Left;  . AMPUTATION Right 06/17/2017   Procedure: Right  BELOW KNEE Amputation;  Surgeon: Elam Dutch, MD;  Location: [facility];  Service: Vascular;  Laterality: Right;  . KNEE SURGERY Right 1984  . PERIPHERAL VASCULAR CATHETERIZATION N/A 09/15/2014   Procedure: Abdominal Aortogram;  Surgeon: Elam Dutch, MD;  [facility]: [facility] INVASIVE CV LAB CUPID;  Service: Cardiovascular;  Laterality: N/A;  . PERIPHERAL VASCULAR CATHETERIZATION Bilateral 09/15/2014   Procedure: Lower Extremity Angiography;  Surgeon: Elam Dutch, MD;  Location: [facility] INVASIVE CV LAB CUPID;  Service: Cardiovascular;  Laterality: Bilateral;  . SPINE SURGERY      There were no vitals filed for this visit.  Subjective  Assessment - 06/15/18 0936    Subjective  No new complaints. No falls or pain to report.     Patient is accompained by:  Family member    Pertinent History  B TTTAs, PAD, HTN, arthritis, utertine CA, osteoporosis, Right knee sg 1984, spine sg    Limitations  Standing;Walking;House hold activities    Patient Stated Goals  Be able to walk around house and going out in community in grandson    Currently in Pain?  No/denies    Pain Score  0-No pain           [facility] Adult PT Treatment/Exercise - 06/15/18 0939      Transfers   Transfers  Sit to Stand;Stand to Sit    Sit to Stand  5: Supervision;With upper extremity assist;From chair/3-in-1    Sit to Stand Details (indicate cue type and reason)  Needs UE support for stability with standing    Stand to Sit  5: Supervision;With upper extremity assist;To chair/3-in-1    Stand to Sit Details  Needs UE support for stability with sitting down.      Ambulation/Gait   Ambulation/Gait  Yes    Ambulation/Gait Assistance  4: Min guard    Ambulation/Gait Assistance Details  cues on posture, walker position with gait and step placement to increase base of support. worked on Software engineer between reps. increased from 6 ply to 9 ply  over course of 4 laps.     Ambulation Distance (Feet)  115 Feet   x 4   Assistive device  Rolling walker;Prostheses    Gait Pattern  Step-through pattern;Decreased step length - right;Decreased step length - left;Decreased stride length;Right flexed knee in stance;Left flexed knee in stance;Wide base of support    Ambulation Surface  Level;Indoor    Ramp  4: Min assist    Ramp Details (indicate cue type and reason)  with RW/bil prostheses- PTA demo'd prior to pt performance with cues needed on technique, posture and step placement/weight shifting.    Curb  4: Min assist    Curb Details (indicate cue type and reason)  with RW/prostheses: PTA demo'd prior to pt performance with cues on stance position, sequencing and  weight shifting.       Prosthetics   Current prosthetic wear tolerance (days/week)   daily    Current prosthetic wear tolerance (#hours/day)   almost all awake hours.    Residual limb condition   No skin issues per pt and grandson    Donning Prosthesis  Supervision    Doffing Prosthesis  Supervision               PT Short Term Goals - 06/15/18 2209      PT SHORT TERM GOAL #1   Title  Patient tolerates prostheses wear daily >6hrs with limb pain <6/10. (All STGs Target Date 06/18/2018)    Baseline  06/15/18: met today    Status  Achieved      PT SHORT TERM GOAL #2   Title  Sit to/from stand chairs with armrests to RW with supervision.     Baseline  06/15/18: met today    Time  --    Period  --    Status  Achieved      PT SHORT TERM GOAL #3   Title  Patient ambulates 33' with RW & prostheses with minimal guard.     Baseline  06/15/18: met today    Time  --    Period  --    Status  Achieved        PT Long Term Goals - 05/19/18 2207      PT LONG TERM GOAL #1   Title  Patient demonstrates / verbalizes safe, independent prosthetic care to enable prosthesis use without issues.  (All LTGs Target Date 08/13/2018)    Time  3    Period  Months    Status  New    Target Date  08/13/18      PT LONG TERM GOAL #2   Title  Patient tolerates wear of prostheses >75% of awake hours without skin issues & limb pain <4/10 to enable function throughout her day.     Time  3    Period  Months    Status  New    Target Date  08/13/18      PT LONG TERM GOAL #3   Title  Standing balance with rolling walker & bilateral prostheses reaching 5" anteriorly & laterally, scans environment with head & upper torso movements and manages clothes modified independent.     Time  3    Period  Months    Status  New    Target Date  08/13/18      PT LONG TERM GOAL #4   Title  Patient ambulates 14' with RW & bilateral prostheses around furniture modified independent to enable mobility in home.    Time  3     Period  Months    Status  New    Target Date  08/13/18      PT LONG TERM GOAL #5   Title  Patient ambulates 150' with RW & prostheses with grandson's assistance for community mobility.     Time  3    Period  Months    Status  New    Target Date  08/13/18      Additional Long Term Goals   Additional Long Term Goals  Yes      PT LONG TERM GOAL #6   Title  Patient negotiates ramps & curbs with RW & prostheses with grandson's assistance for community access.    Time  3    Period  Months    Status  New    Target Date  08/13/18            Plan - 06/15/18 0630    Clinical Impression Statement  Today's skilled session continued to focus on gait with bil prostheses with increased overall distance performed today. Also introduced barriers (ramp and curb) with min assist needed and will need further practice before performing out of therapy. The pt is progressing with all STGs met today. She should benefit from continued PT to progress toward unmet goals.    Rehab Potential  Good    PT Frequency  2x / week    PT Duration  12 weeks    PT Treatment/Interventions  ADLs/Self Care Home Management;DME Instruction;Gait training;Stair training;Functional mobility training;Therapeutic activities;Therapeutic exercise;Balance training;Neuromuscular re-education;Patient/family education;Prosthetic Training;Manual techniques;Vestibular    PT Next Visit Plan  continue to work on gait distance with RW, continue to work on barriers (ramps, curbs, stairs)    Consulted and Agree with Plan of Care  Patient;Family member/caregiver    Family Member Consulted  grandson, [name]       Patient will benefit from skilled therapeutic intervention in order to improve the following deficits and impairments:  Abnormal gait, Decreased activity tolerance, Decreased balance, Decreased endurance, Decreased knowledge of use of DME, Decreased mobility, Decreased range of motion, Decreased scar mobility, Decreased  strength, Dizziness, Impaired flexibility, Postural dysfunction, Prosthetic Dependency  Visit Diagnosis: Abnormal posture  Unsteadiness on feet  Muscle weakness (generalized)  Other abnormalities of gait and mobility     Problem List Patient Active Problem List   Diagnosis Date Noted  . Ischemia of right lower extremity 06/19/2017  . Malnutrition of moderate degree 06/16/2017  . Ischemic foot 06/15/2017  . Cellulitis 06/15/2017  . PAD (peripheral artery disease) ([place])   . Osteoporosis 04-26-17  . Loss or death of child 13-Jun-2015  . Status post below-knee amputation of left lower extremity ([place]) 01/31/2015  . Essential hypertension 08/22/2014    Willow Ora, PTA, [facility] [address] [place], [place] [address] [phone] 06/15/18, 10:11 PM   Name: [name] MRN: [id number] Date of Birth: 31-May-1931    PT Short Term Goals - 06/24/18 1047      PT SHORT TERM GOAL #1   Title  Patient reports wear daily most of awake hours with limb pain </= 4/10. (All updated STGs Target Date: 07/16/2018)    Time  1    Period  Months    Status  Revised    Target Date  07/16/18      PT SHORT TERM GOAL #2   Title  Sit to/from stand chairs without armrests to RW with supervision.  Baseline       Time  1    Period  Months    Status  Revised    Target Date  07/16/18      PT SHORT TERM GOAL #3   Title  Patient ambulates 200' with RW & prostheses with supervision.     Baseline       Time  1    Period  Months    Status  Revised    Target Date  07/16/18      PT SHORT TERM GOAL #4   Title  Patient negotiates ramps & curbs with RW & prosthesis with supervision.     Baseline       Time  1    Period  Months    Status  New    Target Date  07/16/18      Jamey Reas, PT, DPT PT Specializing in [place] 06/24/18 10:52 AM Phone:  [phone]  Fax:  [phone] [facility] [address] [place], [place] [address]

## 2018-06-17 ENCOUNTER — Ambulatory Visit: Payer: Medicare Other | Admitting: Physical Therapy

## 2018-06-21 ENCOUNTER — Encounter: Payer: Self-pay | Admitting: Family Medicine

## 2018-06-22 ENCOUNTER — Ambulatory Visit: Payer: Medicare Other | Admitting: Physical Therapy

## 2018-06-24 ENCOUNTER — Telehealth: Payer: Self-pay

## 2018-06-24 ENCOUNTER — Ambulatory Visit: Payer: Medicare Other | Admitting: Physical Therapy

## 2018-06-24 NOTE — Telephone Encounter (Signed)
Yes, It has been faxed.

## 2018-06-24 NOTE — Telephone Encounter (Signed)
-----   Message from [name], MD sent at 06/24/2018 11:25 AM EST ----- Hi- just wanted to make sure we had faxed most recent order (under letters) [facility] Thank you!

## 2018-06-25 ENCOUNTER — Telehealth: Payer: Self-pay | Admitting: *Deleted

## 2018-06-25 NOTE — Telephone Encounter (Signed)
Received Home Health Discharge-Transfer Summary for review from [facility]; forwarded to provider/SLS 02/14  

## 2018-06-29 ENCOUNTER — Encounter: Payer: Self-pay | Admitting: Physical Therapy

## 2018-06-29 ENCOUNTER — Ambulatory Visit: Payer: Medicare Other | Admitting: Physical Therapy

## 2018-06-29 DIAGNOSIS — R2681 Unsteadiness on feet: Secondary | ICD-10-CM

## 2018-06-29 DIAGNOSIS — R2689 Other abnormalities of gait and mobility: Secondary | ICD-10-CM | POA: Diagnosis not present

## 2018-06-29 DIAGNOSIS — R293 Abnormal posture: Secondary | ICD-10-CM

## 2018-06-29 DIAGNOSIS — M25662 Stiffness of left knee, not elsewhere classified: Secondary | ICD-10-CM | POA: Diagnosis not present

## 2018-06-29 DIAGNOSIS — M6281 Muscle weakness (generalized): Secondary | ICD-10-CM | POA: Diagnosis not present

## 2018-06-29 DIAGNOSIS — M25661 Stiffness of right knee, not elsewhere classified: Secondary | ICD-10-CM | POA: Diagnosis not present

## 2018-06-29 NOTE — Therapy (Signed)
[facility] [address] [place], [name], [address] Phone: [phone]   Fax:  [phone]  Physical Therapy Treatment  Patient Details  Name: [name] MRN: [id number] Date of Birth: Dec 11, 1931 Referring Provider (PT): Lamar Blinks, MD   Encounter Date: 06/29/2018  PT End of Session - 06/29/18 0850    Visit Number  8    Number of Visits  25    Date for PT Re-Evaluation  08/17/18    Authorization Type  Medicare 20% coinsurance after meeting deductible    PT Start Time  [phone]    PT Stop Time  0930    PT Time Calculation (min)  44 min    Equipment Utilized During Treatment  Gait belt    Activity Tolerance  Patient tolerated treatment well    Behavior During Therapy  [facility] for tasks assessed/performed       Past Medical History:  Diagnosis Date  . Arthritis   . Cancer ([place])    utertrine  . Hypertension   . Osteoporosis 04/15/2017  . PAD (peripheral artery disease) ([place])     Past Surgical History:  Procedure Laterality Date  . ABDOMINAL HYSTERECTOMY     partial  . AMPUTATION Left 01/29/2015   Procedure: Left  AMPUTATION BELOW KNEE;  Surgeon: Elam Dutch, MD;  Location: [facility];  Service: Vascular;  Laterality: Left;  . AMPUTATION Right 06/17/2017   Procedure: Right  BELOW KNEE Amputation;  Surgeon: Elam Dutch, MD;  Location: [facility];  Service: Vascular;  Laterality: Right;  . KNEE SURGERY Right 1984  . PERIPHERAL VASCULAR CATHETERIZATION N/A 09/15/2014   Procedure: Abdominal Aortogram;  Surgeon: Elam Dutch, MD;  Location: [facility] INVASIVE CV LAB CUPID;  Service: Cardiovascular;  Laterality: N/A;  . PERIPHERAL VASCULAR CATHETERIZATION Bilateral 09/15/2014   Procedure: Lower Extremity Angiography;  Surgeon: Elam Dutch, MD;  Location: [facility] INVASIVE CV LAB CUPID;  Service: Cardiovascular;  Laterality: Bilateral;  . SPINE SURGERY      There were no vitals filed for this visit.  Subjective  Assessment - 06/29/18 0850    Subjective  No new complaints. No falls or pain to report. See's Chris tomorrow (was suppossed to see him today however they have to go home and work on [employer] that went out today).    Patient is accompained by:  Family member    Pertinent History  B TTTAs, PAD, HTN, arthritis, utertine CA, osteoporosis, Right knee sg 1984, spine sg    Limitations  Standing;Walking;House hold activities    Patient Stated Goals  Be able to walk around house and going out in community in grandson    Currently in Pain?  No/denies    Pain Score  0-No pain         [facility] Adult PT Treatment/Exercise - 06/29/18 0851      Transfers   Transfers  Sit to Stand;Stand to Sit    Sit to Stand  5: Supervision;With upper extremity assist;From chair/3-in-1    Stand to Sit  5: Supervision;With upper extremity assist;To chair/3-in-1      Ambulation/Gait   Ambulation/Gait  Yes    Ambulation/Gait Assistance  4: Min guard    Ambulation/Gait Assistance Details  cues on posture, step placement to widen base of support and step height due to toe drag at times with gait. use of color bands at bottom of walker on 3rd rep/remainder of session to work on step placement/base of support with gait.  Ambulation Distance (Feet)  120 Feet   x4, plus around gym with activities   Assistive device  Rolling walker;Prostheses    Gait Pattern  Step-through pattern;Decreased step length - right;Decreased step length - left;Decreased stride length;Right flexed knee in stance;Left flexed knee in stance;Wide base of support    Ambulation Surface  Level;Indoor    Stairs  Yes    Stairs Assistance  4: Min guard;4: Min assist    Stairs Assistance Details (indicate cue type and reason)  cues for technique to alternate LE's, weight shift and advance UE's on rails.     Stair Management Technique  Two rails;Step to pattern;Forwards;One rail Left;Sideways    Number of Stairs  4   x2 reps fwd; x1 rep sideways   Height  of Stairs  6      Prosthetics   Current prosthetic wear tolerance (days/week)   daily    Current prosthetic wear tolerance (#hours/day)   almost all awake hours.    Residual limb condition   No skin issues per pt and grandson    Donning Prosthesis  Supervision    Doffing Prosthesis  Supervision            PT Short Term Goals - 06/24/18 1047      PT SHORT TERM GOAL #1   Title  Patient reports wear daily most of awake hours with limb pain </= 4/10. (All updated STGs Target Date: 07/16/2018)    Time  1    Period  Months    Status  Revised    Target Date  07/16/18      PT SHORT TERM GOAL #2   Title  Sit to/from stand chairs without armrests to RW with supervision.     Baseline       Time  1    Period  Months    Status  Revised    Target Date  07/16/18      PT SHORT TERM GOAL #3   Title  Patient ambulates 200' with RW & prostheses with supervision.     Baseline       Time  1    Period  Months    Status  Revised    Target Date  07/16/18      PT SHORT TERM GOAL #4   Title  Patient negotiates ramps & curbs with RW & prosthesis with supervision.     Baseline       Time  1    Period  Months    Status  New    Target Date  07/16/18        PT Long Term Goals - 05/19/18 2207      PT LONG TERM GOAL #1   Title  Patient demonstrates / verbalizes safe, independent prosthetic care to enable prosthesis use without issues.  (All LTGs Target Date 08/13/2018)    Time  3    Period  Months    Status  New    Target Date  08/13/18      PT LONG TERM GOAL #2   Title  Patient tolerates wear of prostheses >75% of awake hours without skin issues & limb pain <4/10 to enable function throughout her day.     Time  3    Period  Months    Status  New    Target Date  08/13/18      PT LONG TERM GOAL #3   Title  Standing balance with rolling walker & bilateral prostheses  reaching 5" anteriorly & laterally, scans environment with head & upper torso movements and manages clothes modified  independent.     Time  3    Period  Months    Status  New    Target Date  08/13/18      PT LONG TERM GOAL #4   Title  Patient ambulates 38' with RW & bilateral prostheses around furniture modified independent to enable mobility in home.    Time  3    Period  Months    Status  New    Target Date  08/13/18      PT LONG TERM GOAL #5   Title  Patient ambulates 150' with RW & prostheses with grandson's assistance for community mobility.     Time  3    Period  Months    Status  New    Target Date  08/13/18      Additional Long Term Goals   Additional Long Term Goals  Yes      PT LONG TERM GOAL #6   Title  Patient negotiates ramps & curbs with RW & prostheses with grandson's assistance for community access.    Time  3    Period  Months    Status  New    Target Date  08/13/18            Plan - 06/29/18 0850    Clinical Impression Statement  Today's skilled session continued to focus on gait/barriers with prostheses/RW with overall increased distance with gait today. Pt did need increased assistance on stairs, which could be due to her being sick the past few weeks. Overall the pt is progressing toward goals and should benefit from continued PT to progress toward unmet goals.    Rehab Potential  Good    PT Frequency  2x / week    PT Duration  12 weeks    PT Treatment/Interventions  ADLs/Self Care Home Management;DME Instruction;Gait training;Stair training;Functional mobility training;Therapeutic activities;Therapeutic exercise;Balance training;Neuromuscular re-education;Patient/family education;Prosthetic Training;Manual techniques;Vestibular    PT Next Visit Plan  continue to work on gait distance with RW, continue to work on barriers (ramps, curbs, stairs)    Consulted and Agree with Plan of Care  Patient;Family member/caregiver    Family Member Consulted  grandson, [name]       Patient will benefit from skilled therapeutic intervention in order to improve the following  deficits and impairments:  Abnormal gait, Decreased activity tolerance, Decreased balance, Decreased endurance, Decreased knowledge of use of DME, Decreased mobility, Decreased range of motion, Decreased scar mobility, Decreased strength, Dizziness, Impaired flexibility, Postural dysfunction, Prosthetic Dependency  Visit Diagnosis: Abnormal posture  Muscle weakness (generalized)  Unsteadiness on feet  Other abnormalities of gait and mobility     Problem List Patient Active Problem List   Diagnosis Date Noted  . Ischemia of right lower extremity 06/19/2017  . Malnutrition of moderate degree 06/16/2017  . Ischemic foot 06/15/2017  . Cellulitis 06/15/2017  . PAD (peripheral artery disease) ([place])   . Osteoporosis 05-04-2017  . Loss or death of child 06/21/15  . Status post below-knee amputation of left lower extremity ([place]) 01/31/2015  . Essential hypertension 08/22/2014    Willow Ora, PTA, [facility] [address] [place],  [address] [phone] 06/29/18, 9:32 PM   Name: [name] MRN: [id number] Date of Birth: 1932/02/05

## 2018-07-01 ENCOUNTER — Encounter: Payer: Self-pay | Admitting: Physical Therapy

## 2018-07-01 ENCOUNTER — Ambulatory Visit: Payer: Medicare Other | Admitting: Physical Therapy

## 2018-07-01 DIAGNOSIS — M25662 Stiffness of left knee, not elsewhere classified: Secondary | ICD-10-CM | POA: Diagnosis not present

## 2018-07-01 DIAGNOSIS — R2681 Unsteadiness on feet: Secondary | ICD-10-CM

## 2018-07-01 DIAGNOSIS — M25661 Stiffness of right knee, not elsewhere classified: Secondary | ICD-10-CM | POA: Diagnosis not present

## 2018-07-01 DIAGNOSIS — M6281 Muscle weakness (generalized): Secondary | ICD-10-CM

## 2018-07-01 DIAGNOSIS — R2689 Other abnormalities of gait and mobility: Secondary | ICD-10-CM | POA: Diagnosis not present

## 2018-07-01 DIAGNOSIS — R293 Abnormal posture: Secondary | ICD-10-CM | POA: Diagnosis not present

## 2018-07-01 NOTE — Therapy (Signed)
[facility] [address] [place], [name], [address] Phone: [phone]   Fax:  [phone]  Physical Therapy Treatment  Patient Details  Name: [name] MRN: [id number] Date of Birth: [birth year] Referring Provider (PT): Lamar Blinks, MD   Encounter Date: 07/01/2018  PT End of Session - 07/01/18 0854    Visit Number  9    Number of Visits  25    Date for PT Re-Evaluation  08/17/18    Authorization Type  Medicare 20% coinsurance after meeting deductible    PT Start Time  0849    PT Stop Time  0929    PT Time Calculation (min)  40 min    Equipment Utilized During Treatment  Gait belt    Activity Tolerance  Patient tolerated treatment well    Behavior During Therapy  [facility] for tasks assessed/performed       Past Medical History:  Diagnosis Date  . Arthritis   . Cancer ([place])    utertrine  . Hypertension   . Osteoporosis 04/15/2017  . PAD (peripheral artery disease) ([place])     Past Surgical History:  Procedure Laterality Date  . ABDOMINAL HYSTERECTOMY     partial  . AMPUTATION Left 01/29/2015   Procedure: Left  AMPUTATION BELOW KNEE;  Surgeon: Elam Dutch, MD;  Location: [facility];  Service: Vascular;  Laterality: Left;  . AMPUTATION Right 06/17/2017   Procedure: Right  BELOW KNEE Amputation;  Surgeon: Elam Dutch, MD;  Location: [facility];  Service: Vascular;  Laterality: Right;  . KNEE SURGERY Right 1984  . PERIPHERAL VASCULAR CATHETERIZATION N/A 09/15/2014   Procedure: Abdominal Aortogram;  Surgeon: Elam Dutch, MD;  Location: [facility] INVASIVE CV LAB CUPID;  Service: Cardiovascular;  Laterality: N/A;  . PERIPHERAL VASCULAR CATHETERIZATION Bilateral 09/15/2014   Procedure: Lower Extremity Angiography;  Surgeon: Elam Dutch, MD;  Location: [facility] INVASIVE CV LAB CUPID;  Service: Cardiovascular;  Laterality: Bilateral;  . SPINE SURGERY      There were no vitals filed for this visit.  Subjective  Assessment - 07/01/18 0853    Subjective  Saw Gerald Stabs and was casted for a new socket for the right leg. No issues with left socket. No falls or pain to report.     Patient is accompained by:  Family member    Pertinent History  B TTTAs, PAD, HTN, arthritis, utertine CA, osteoporosis, Right knee sg 1984, spine sg    Patient Stated Goals  Be able to walk around house and going out in community in grandson    Currently in Pain?  No/denies    Pain Score  0-No pain           [facility] Adult PT Treatment/Exercise - 07/01/18 0854      Transfers   Transfers  Sit to Stand;Stand to Sit    Sit to Stand  5: Supervision;With upper extremity assist;From chair/3-in-1    Sit to Stand Details (indicate cue type and reason)  needs UE support for stability with standing for balance    Stand to Sit  5: Supervision;With upper extremity assist;To chair/3-in-1    Stand to Sit Details  needs UE support for stability with sitting down       Ambulation/Gait   Ambulation/Gait  Yes    Ambulation/Gait Assistance  4: Min guard    Ambulation/Gait Assistance Details  use of colored band for assist with step placement. cues on posture, base of support and weight shifting.  Ambulation Distance (Feet)  230 Feet   x 2 reps; around gym with barriers/activities.   Assistive device  Rolling walker;Prostheses    Gait Pattern  Step-through pattern;Decreased step length - right;Decreased step length - left;Decreased stride length;Right flexed knee in stance;Left flexed knee in stance;Wide base of support    Ramp  3: Mod assist;2: Max assist    Ramp Details (indicate cue type and reason)  with RW/bil prostheses. cues on posture, technique and walker position with negotiation. min assist to ascend with both reps, mod assist to descend with 1st rep, max assist with 2cd rep. tends to have all her weight posterior needing assist to prevent her from "sitting down" with descending ramp.     Curb  4: Min assist    Curb Details  (indicate cue type and reason)  cues needed on stance position for walker advancement to improve balance. pt able to recall and demo proper sequence. assist needed for balance and to power up to ascend indoor 6 inch curb.       High Level Balance   High Level Balance Activities  Side stepping;Marching forwards;Backward walking    High Level Balance Comments  in parallel bars with bil UE support: 3 laps each with cues on posture and ex form/technique.       Prosthetics   Current prosthetic wear tolerance (days/week)   daily    Current prosthetic wear tolerance (#hours/day)   almost all awake hours.    Residual limb condition   No skin issues per pt and grandson    Donning Prosthesis  Supervision    Doffing Prosthesis  Supervision               PT Short Term Goals - 06/24/18 1047      PT SHORT TERM GOAL #1   Title  Patient reports wear daily most of awake hours with limb pain </= 4/10. (All updated STGs Target Date: 07/16/2018)    Time  1    Period  Months    Status  Revised    Target Date  07/16/18      PT SHORT TERM GOAL #2   Title  Sit to/from stand chairs without armrests to RW with supervision.     Baseline       Time  1    Period  Months    Status  Revised    Target Date  07/16/18      PT SHORT TERM GOAL #3   Title  Patient ambulates 200' with RW & prostheses with supervision.     Baseline       Time  1    Period  Months    Status  Revised    Target Date  07/16/18      PT SHORT TERM GOAL #4   Title  Patient negotiates ramps & curbs with RW & prosthesis with supervision.     Baseline       Time  1    Period  Months    Status  New    Target Date  07/16/18        PT Long Term Goals - 05/19/18 2207      PT LONG TERM GOAL #1   Title  Patient demonstrates / verbalizes safe, independent prosthetic care to enable prosthesis use without issues.  (All LTGs Target Date 08/13/2018)    Time  3    Period  Months    Status  New    Target  Date  08/13/18      PT  LONG TERM GOAL #2   Title  Patient tolerates wear of prostheses >75% of awake hours without skin issues & limb pain <4/10 to enable function throughout her day.     Time  3    Period  Months    Status  New    Target Date  08/13/18      PT LONG TERM GOAL #3   Title  Standing balance with rolling walker & bilateral prostheses reaching 5" anteriorly & laterally, scans environment with head & upper torso movements and manages clothes modified independent.     Time  3    Period  Months    Status  New    Target Date  08/13/18      PT LONG TERM GOAL #4   Title  Patient ambulates 40' with RW & bilateral prostheses around furniture modified independent to enable mobility in home.    Time  3    Period  Months    Status  New    Target Date  08/13/18      PT LONG TERM GOAL #5   Title  Patient ambulates 150' with RW & prostheses with grandson's assistance for community mobility.     Time  3    Period  Months    Status  New    Target Date  08/13/18      Additional Long Term Goals   Additional Long Term Goals  Yes      PT LONG TERM GOAL #6   Title  Patient negotiates ramps & curbs with RW & prostheses with grandson's assistance for community access.    Time  3    Period  Months    Status  New    Target Date  08/13/18            Plan - 07/01/18 0854    Clinical Impression Statement  Today's skilled session continued to focus on gait and barriers with bil prostheses/RW and began to address balance reactions. The pt needs continued instruction on ramps before she performs them outside of PT. The pt is progressing toward goals and should benefit from continued PT to progress toward unmet goals.     Rehab Potential  Good    PT Frequency  2x / week    PT Duration  12 weeks    PT Treatment/Interventions  ADLs/Self Care Home Management;DME Instruction;Gait training;Stair training;Functional mobility training;Therapeutic activities;Therapeutic exercise;Balance training;Neuromuscular  re-education;Patient/family education;Prosthetic Training;Manual techniques;Vestibular    PT Next Visit Plan  10 visit progress note due next sesson, continue to work on gait distance with RW, continue to work on barriers (ramps, curbs, stairs), continue to work on balance reactions    Consulted and Agree with Plan of Care  Patient;Family member/caregiver    Family Member Consulted  grandson, [name]       Patient will benefit from skilled therapeutic intervention in order to improve the following deficits and impairments:  Abnormal gait, Decreased activity tolerance, Decreased balance, Decreased endurance, Decreased knowledge of use of DME, Decreased mobility, Decreased range of motion, Decreased scar mobility, Decreased strength, Dizziness, Impaired flexibility, Postural dysfunction, Prosthetic Dependency  Visit Diagnosis: Muscle weakness (generalized)  Unsteadiness on feet  Other abnormalities of gait and mobility  Abnormal posture     Problem List Patient Active Problem List   Diagnosis Date Noted  . Ischemia of right lower extremity 06/19/2017  . Malnutrition of moderate degree 06/16/2017  . Ischemic  foot 06/15/2017  . Cellulitis 06/15/2017  . PAD (peripheral artery disease) ([facility])   . Osteoporosis 04/17/2017  . Loss or death of child 06/04/2015  . Status post below-knee amputation of left lower extremity ([place]) 01/31/2015  . Essential hypertension 08/22/2014    Willow Ora, PTA, [facility] [address], [place], [place] [address] [phone] 07/01/18, 2:16 PM   Name: MACHELL WIRTHLIN MRN: [id number] Date of Birth: 11-Jul-1931

## 2018-07-06 ENCOUNTER — Encounter: Payer: Self-pay | Admitting: Physical Therapy

## 2018-07-06 ENCOUNTER — Ambulatory Visit: Payer: Medicare Other | Admitting: Physical Therapy

## 2018-07-06 DIAGNOSIS — R293 Abnormal posture: Secondary | ICD-10-CM | POA: Diagnosis not present

## 2018-07-06 DIAGNOSIS — M6281 Muscle weakness (generalized): Secondary | ICD-10-CM

## 2018-07-06 DIAGNOSIS — M25662 Stiffness of left knee, not elsewhere classified: Secondary | ICD-10-CM | POA: Diagnosis not present

## 2018-07-06 DIAGNOSIS — R2689 Other abnormalities of gait and mobility: Secondary | ICD-10-CM | POA: Diagnosis not present

## 2018-07-06 DIAGNOSIS — M25661 Stiffness of right knee, not elsewhere classified: Secondary | ICD-10-CM | POA: Diagnosis not present

## 2018-07-06 DIAGNOSIS — R2681 Unsteadiness on feet: Secondary | ICD-10-CM | POA: Diagnosis not present

## 2018-07-07 NOTE — Therapy (Addendum)
[facility] [address] [place], [name], [address] Phone: [phone]   Fax:  [phone]  Physical Therapy Treatment & Progress Note  Patient Details  Name: [name] MRN: [id number] Date of Birth: 12/27/1931 Referring Provider (PT): Lamar Blinks, MD   Encounter Date: 07/06/2018   Progress Note Reporting Period 05/19/2018 to 07/06/2018  See note below for Objective Data and Assessment of Progress/Goals.   Jamey Reas, PT, DPT PT Specializing in [place] 07/08/18 5:04 PM Phone:  [phone]  Fax:  [phone] [facility] [address] [place], [place] [address]      07/06/18 [phone]  PT Visits / Re-Eval  Visit Number 10  Number of Visits 25  Date for PT Re-Evaluation 08/17/18  Authorization  Authorization Type Medicare 20% coinsurance after meeting deductible  PT Time Calculation  PT Start Time 0847  PT Stop Time 0930  PT Time Calculation (min) 43 min  PT - End of Session  Equipment Utilized During Treatment Gait belt  Activity Tolerance Patient tolerated treatment well  Behavior During Therapy [facility] for tasks assessed/performed    Past Medical History:  Diagnosis Date  . Arthritis   . Cancer ([place])    utertrine  . Hypertension   . Osteoporosis 04/15/2017  . PAD (peripheral artery disease) ([facility])     Past Surgical History:  Procedure Laterality Date  . ABDOMINAL HYSTERECTOMY     partial  . AMPUTATION Left 01/29/2015   Procedure: Left  AMPUTATION BELOW KNEE;  Surgeon: Elam Dutch, MD;  Location: [facility];  Service: Vascular;  Laterality: Left;  . AMPUTATION Right 06/17/2017   Procedure: Right  BELOW KNEE Amputation;  Surgeon: Elam Dutch, MD;  Location: [facility];  Service: Vascular;  Laterality: Right;  . KNEE SURGERY Right 1984  . PERIPHERAL VASCULAR CATHETERIZATION N/A 09/15/2014   Procedure: Abdominal Aortogram;  Surgeon: Elam Dutch, MD;   Location: [facility] INVASIVE CV LAB CUPID;  Service: Cardiovascular;  Laterality: N/A;  . PERIPHERAL VASCULAR CATHETERIZATION Bilateral 09/15/2014   Procedure: Lower Extremity Angiography;  Surgeon: Elam Dutch, MD;  Location: [facility] INVASIVE CV LAB CUPID;  Service: Cardiovascular;  Laterality: Bilateral;  . SPINE SURGERY      There were no vitals filed for this visit.     07/06/18 0851  Symptoms/Limitations  Subjective No new complaints. No falls or pain to report. See Gerald Stabs tomorrow for fitting of new socket.   Patient is accompained by: Family member  Pertinent History B TTTAs, PAD, HTN, arthritis, utertine CA, osteoporosis, Right knee sg 1984, spine sg  Patient Stated Goals Be able to walk around house and going out in community in grandson  Pain Assessment  Currently in Pain? No/denies  Pain Score 0      07/06/18 0854  Transfers  Transfers Sit to Stand;Stand to Sit  Sit to Stand 5: Supervision;With upper extremity assist;From chair/3-in-1  Sit to Stand Details (indicate cue type and reason) continues to need UE support for stability upon standing.   Stand to Sit 5: Supervision;With upper extremity assist;To chair/3-in-1  Stand to Sit Details continues to need UE support with sitting down for stability   Ambulation/Gait  Ambulation/Gait Yes  Ambulation/Gait Assistance 4: Min guard;5: Supervision  Ambulation/Gait Assistance Details used tabs of walker as visual for where to step feet with each step, cues on posture and to maintain wider base of support.   Ambulation Distance (Feet) 250 Feet (x2 reps, plus around  gym with activities)  Assistive device Rolling walker;Prostheses  Gait Pattern Step-through pattern;Decreased step length - right;Decreased step length - left;Decreased stride length;Right flexed knee in stance;Left flexed knee in stance;Wide base of support  Ramp 3: Mod assist  Ramp Details (indicate cue type and reason) with RW/prosthesis x 4 reps. cues/facilitation for  correct hand placement on walker, sequencing, weight shifitng and technique. decreased assitance needed to ascend ramp vs descend. pt needs further practice with descending ramp.                    Curb 4: Min assist;Other (comment) (min guard to descend)  Curb Details (indicate cue type and reason) with RW/prosthesis x 1 rep with cues on sequencing and technique.   Prosthetics  Current prosthetic wear tolerance (days/week)  daily  Current prosthetic wear tolerance (#hours/day)  almost all awake hours.  Residual limb condition  No skin issues per pt and grandson  Education Provided Residual limb care;Correct ply sock adjustment  Person(s) Educated Patient;Other (comment) (grandson)  Education Method Explanation;Demonstration;Verbal cues  Education Method Verbalized understanding;Returned demonstration;Verbal cues required;Needs further instruction  Donning Prosthesis 5  Doffing Prosthesis 5       PT Short Term Goals - 06/24/18 1047      PT SHORT TERM GOAL #1   Title  Patient reports wear daily most of awake hours with limb pain </= 4/10. (All updated STGs Target Date: 07/16/2018)    Time  1    Period  Months    Status  Revised    Target Date  07/16/18      PT SHORT TERM GOAL #2   Title  Sit to/from stand chairs without armrests to RW with supervision.     Baseline       Time  1    Period  Months    Status  Revised    Target Date  07/16/18      PT SHORT TERM GOAL #3   Title  Patient ambulates 200' with RW & prostheses with supervision.     Baseline       Time  1    Period  Months    Status  Revised    Target Date  07/16/18      PT SHORT TERM GOAL #4   Title  Patient negotiates ramps & curbs with RW & prosthesis with supervision.     Baseline       Time  1    Period  Months    Status  New    Target Date  07/16/18        PT Long Term Goals - 05/19/18 2207      PT LONG TERM GOAL #1   Title  Patient demonstrates / verbalizes safe, independent prosthetic care to  enable prosthesis use without issues.  (All LTGs Target Date 08/13/2018)    Time  3    Period  Months    Status  New    Target Date  08/13/18      PT LONG TERM GOAL #2   Title  Patient tolerates wear of prostheses >75% of awake hours without skin issues & limb pain <4/10 to enable function throughout her day.     Time  3    Period  Months    Status  New    Target Date  08/13/18      PT LONG TERM GOAL #3   Title  Standing balance with rolling walker & bilateral prostheses reaching  5" anteriorly & laterally, scans environment with head & upper torso movements and manages clothes modified independent.     Time  3    Period  Months    Status  New    Target Date  08/13/18      PT LONG TERM GOAL #4   Title  Patient ambulates 17' with RW & bilateral prostheses around furniture modified independent to enable mobility in home.    Time  3    Period  Months    Status  New    Target Date  08/13/18      PT LONG TERM GOAL #5   Title  Patient ambulates 150' with RW & prostheses with grandson's assistance for community mobility.     Time  3    Period  Months    Status  New    Target Date  08/13/18      Additional Long Term Goals   Additional Long Term Goals  Yes      PT LONG TERM GOAL #6   Title  Patient negotiates ramps & curbs with RW & prostheses with grandson's assistance for community access.    Time  3    Period  Months    Status  New    Target Date  08/13/18          07/06/18 0853  Plan  Clinical Impression Statement Today's skilled session continued to focus on increased gait distance and barriers with RW/prostheses with less assist needed with gait. The pt needs further work on ramps before performing outside of therapy. The pt is progressing toward goals and should benefit from continued PT to progress toward unmet goals.   Pt will benefit from skilled therapeutic intervention in order to improve on the following deficits Abnormal gait;Decreased activity tolerance;Decreased  balance;Decreased endurance;Decreased knowledge of use of DME;Decreased mobility;Decreased range of motion;Decreased scar mobility;Decreased strength;Dizziness;Impaired flexibility;Postural dysfunction;Prosthetic Dependency  Rehab Potential Good  PT Frequency 2x / week  PT Duration 12 weeks  PT Treatment/Interventions ADLs/Self Care Home Management;DME Instruction;Gait training;Stair training;Functional mobility training;Therapeutic activities;Therapeutic exercise;Balance training;Neuromuscular re-education;Patient/family education;Prosthetic Training;Manual techniques;Vestibular  PT Next Visit Plan  continue to work on gait distance with RW, continue to work on barriers (ramps, curbs, stairs), continue to work on balance reactions  Consulted and Agree with Plan of Care Patient;Family member/caregiver  Family Member Consulted grandson, [name]         Patient will benefit from skilled therapeutic intervention in order to improve the following deficits and impairments:  Abnormal gait, Decreased activity tolerance, Decreased balance, Decreased endurance, Decreased knowledge of use of DME, Decreased mobility, Decreased range of motion, Decreased scar mobility, Decreased strength, Dizziness, Impaired flexibility, Postural dysfunction, Prosthetic Dependency  Visit Diagnosis: Muscle weakness (generalized)  Unsteadiness on feet  Other abnormalities of gait and mobility     Problem List Patient Active Problem List   Diagnosis Date Noted  . Ischemia of right lower extremity 06/19/2017  . Malnutrition of moderate degree 06/16/2017  . Ischemic foot 06/15/2017  . Cellulitis 06/15/2017  . PAD (peripheral artery disease) ([facility])   . Osteoporosis 04-22-2017  . Loss or death of child 06/09/2015  . Status post below-knee amputation of left lower extremity ([facility]) 01/31/2015  . Essential hypertension 08/22/2014    Willow Ora, PTA, [facility] [address], [place], [place] [address] [phone] 07/07/18, 10:05 PM  Name: BIANEY TESORO MRN: [id number] Date of Birth: [birth year]

## 2018-07-08 ENCOUNTER — Ambulatory Visit: Payer: Medicare Other | Admitting: Physical Therapy

## 2018-07-08 ENCOUNTER — Encounter: Payer: Medicare Other | Admitting: Physical Therapy

## 2018-07-08 ENCOUNTER — Encounter: Payer: Self-pay | Admitting: Physical Therapy

## 2018-07-08 DIAGNOSIS — R2689 Other abnormalities of gait and mobility: Secondary | ICD-10-CM | POA: Diagnosis not present

## 2018-07-08 DIAGNOSIS — M25661 Stiffness of right knee, not elsewhere classified: Secondary | ICD-10-CM

## 2018-07-08 DIAGNOSIS — R293 Abnormal posture: Secondary | ICD-10-CM

## 2018-07-08 DIAGNOSIS — M6281 Muscle weakness (generalized): Secondary | ICD-10-CM

## 2018-07-08 DIAGNOSIS — R2681 Unsteadiness on feet: Secondary | ICD-10-CM

## 2018-07-08 DIAGNOSIS — M25662 Stiffness of left knee, not elsewhere classified: Secondary | ICD-10-CM | POA: Diagnosis not present

## 2018-07-08 NOTE — Therapy (Signed)
[facility] [address] [place], [name], [address] Phone: [phone]   Fax:  [phone]  Physical Therapy Treatment  Patient Details  Name: [name] MRN: [id number] Date of Birth: 1931-09-09 Referring Provider (PT): Lamar Blinks, MD   Encounter Date: 07/08/2018  PT End of Session - 07/08/18 1239    Visit Number  11    Number of Visits  25    Date for PT Re-Evaluation  08/17/18    Authorization Type  Medicare 20% coinsurance after meeting deductible    PT Start Time  1015    PT Stop Time  1100    PT Time Calculation (min)  45 min    Equipment Utilized During Treatment  Gait belt    Activity Tolerance  Patient tolerated treatment well    Behavior During Therapy  [facility] for tasks assessed/performed       Past Medical History:  Diagnosis Date  . Arthritis   . Cancer ([place])    utertrine  . Hypertension   . Osteoporosis 04/15/2017  . PAD (peripheral artery disease) ([place])     Past Surgical History:  Procedure Laterality Date  . ABDOMINAL HYSTERECTOMY     partial  . AMPUTATION Left 01/29/2015   Procedure: Left  AMPUTATION BELOW KNEE;  Surgeon: Elam Dutch, MD;  Location: [facility];  Service: Vascular;  Laterality: Left;  . AMPUTATION Right 06/17/2017   Procedure: Right  BELOW KNEE Amputation;  Surgeon: Elam Dutch, MD;  Location: [facility];  Service: Vascular;  Laterality: Right;  . KNEE SURGERY Right 1984  . PERIPHERAL VASCULAR CATHETERIZATION N/A 09/15/2014   Procedure: Abdominal Aortogram;  Surgeon: Elam Dutch, MD;  Location: [facility] INVASIVE CV LAB CUPID;  Service: Cardiovascular;  Laterality: N/A;  . PERIPHERAL VASCULAR CATHETERIZATION Bilateral 09/15/2014   Procedure: Lower Extremity Angiography;  Surgeon: Elam Dutch, MD;  Location: [facility] INVASIVE CV LAB CUPID;  Service: Cardiovascular;  Laterality: Bilateral;  . SPINE SURGERY      There were no vitals filed for this visit.  Subjective  Assessment - 07/08/18 0930    Subjective  No falls. Wearing prostheses all awake hours daily.     Patient is accompained by:  Family member    Pertinent History  B TTTAs, PAD, HTN, arthritis, utertine CA, osteoporosis, Right knee sg 1984, spine sg    Patient Stated Goals  Be able to walk around house and going out in community in grandson    Currently in Pain?  No/denies                       [facility] Adult PT Treatment/Exercise - 07/08/18 0930      Transfers   Transfers  Sit to Stand;Stand to Sit    Sit to Stand  5: Supervision;With upper extremity assist;From chair/3-in-1   chairs without armrests to rollator   Sit to Stand Details  Visual cues for safe use of DME/AE;Verbal cues for safe use of DME/AE;Verbal cues for technique    Stand to Sit  5: Supervision;With upper extremity assist;To chair/3-in-1   chairs without armrests from rollator   Stand to Sit Details (indicate cue type and reason)  Visual cues for safe use of DME/AE;Verbal cues for safe use of DME/AE;Verbal cues for technique    Stand Pivot Transfers  5: Supervision;With armrests   turning 180* to /from rollator seat   Stand Pivot Transfer Details (indicate cue type and reason)  PT demo,  instructed in technique. Pt return demo understanding with rollator stabilized against wall.       Ambulation/Gait   Ambulation/Gait  Yes    Ambulation/Gait Assistance  4: Min guard;5: Supervision    Ambulation/Gait Assistance Details  PT verbal, demo & tactile cues on technique / safety with rollator    Ambulation Distance (Feet)  150 Feet   150' X 2   Assistive device  Prostheses;Rollator    Gait Pattern  Step-through pattern;Decreased step length - right;Decreased step length - left;Decreased stride length;Right flexed knee in stance;Left flexed knee in stance;Wide base of support    Ambulation Surface  Indoor;Level    Ramp  4: Min assist   rollator walker & prostheses   Ramp Details (indicate cue type and reason)  PT  demo, verbal & tactile cues on technique with rollator walker & prostheses    Curb  4: Min assist;Other (comment)    Curb Details (indicate cue type and reason)  PT demo, verbal & tactile cues on technique with rollator walker & prostheses      Prosthetics   Prosthetic Care Comments   PT instructed in timing & cleaning of liners.     Current prosthetic wear tolerance (days/week)   daily    Current prosthetic wear tolerance (#hours/day)   almost all awake hours.    Residual limb condition   No skin issues per pt and grandson    Education Provided  Residual limb care;Correct ply sock adjustment;Proper Donning;Prosthetic cleaning    Person(s) Educated  Patient;Caregiver(s);Other (comment)   grandson   Education Method  Explanation;Verbal cues    Education Method  Verbalized understanding;Needs further instruction               PT Short Term Goals - 06/24/18 1047      PT SHORT TERM GOAL #1   Title  Patient reports wear daily most of awake hours with limb pain </= 4/10. (All updated STGs Target Date: 07/16/2018)    Time  1    Period  Months    Status  Revised    Target Date  07/16/18      PT SHORT TERM GOAL #2   Title  Sit to/from stand chairs without armrests to RW with supervision.     Baseline       Time  1    Period  Months    Status  Revised    Target Date  07/16/18      PT SHORT TERM GOAL #3   Title  Patient ambulates 200' with RW & prostheses with supervision.     Baseline       Time  1    Period  Months    Status  Revised    Target Date  07/16/18      PT SHORT TERM GOAL #4   Title  Patient negotiates ramps & curbs with RW & prosthesis with supervision.     Baseline       Time  1    Period  Months    Status  New    Target Date  07/16/18        PT Long Term Goals - 05/19/18 2207      PT LONG TERM GOAL #1   Title  Patient demonstrates / verbalizes safe, independent prosthetic care to enable prosthesis use without issues.  (All LTGs Target Date 08/13/2018)     Time  3    Period  Months    Status  New    Target Date  08/13/18      PT LONG TERM GOAL #2   Title  Patient tolerates wear of prostheses >75% of awake hours without skin issues & limb pain <4/10 to enable function throughout her day.     Time  3    Period  Months    Status  New    Target Date  08/13/18      PT LONG TERM GOAL #3   Title  Standing balance with rolling walker & bilateral prostheses reaching 5" anteriorly & laterally, scans environment with head & upper torso movements and manages clothes modified independent.     Time  3    Period  Months    Status  New    Target Date  08/13/18      PT LONG TERM GOAL #4   Title  Patient ambulates 52' with RW & bilateral prostheses around furniture modified independent to enable mobility in home.    Time  3    Period  Months    Status  New    Target Date  08/13/18      PT LONG TERM GOAL #5   Title  Patient ambulates 150' with RW & prostheses with grandson's assistance for community mobility.     Time  3    Period  Months    Status  New    Target Date  08/13/18      Additional Long Term Goals   Additional Long Term Goals  Yes      PT LONG TERM GOAL #6   Title  Patient negotiates ramps & curbs with RW & prostheses with grandson's assistance for community access.    Time  3    Period  Months    Status  New    Target Date  08/13/18            Plan - 07/08/18 1659    Clinical Impression Statement  Today's skilled session focused on gait with her rollator walker including ramps & curbs. Patient & grandson appear to have general understanding.     Rehab Potential  Good    PT Frequency  2x / week    PT Duration  12 weeks    PT Treatment/Interventions  ADLs/Self Care Home Management;DME Instruction;Gait training;Stair training;Functional mobility training;Therapeutic activities;Therapeutic exercise;Balance training;Neuromuscular re-education;Patient/family education;Prosthetic Training;Manual techniques;Vestibular    PT  Next Visit Plan  check STGs.    Consulted and Agree with Plan of Care  Patient;Family member/caregiver    Family Member Consulted  grandson, [name]       Patient will benefit from skilled therapeutic intervention in order to improve the following deficits and impairments:  Abnormal gait, Decreased activity tolerance, Decreased balance, Decreased endurance, Decreased knowledge of use of DME, Decreased mobility, Decreased range of motion, Decreased scar mobility, Decreased strength, Dizziness, Impaired flexibility, Postural dysfunction, Prosthetic Dependency  Visit Diagnosis: Muscle weakness (generalized)  Unsteadiness on feet  Other abnormalities of gait and mobility  Abnormal posture  Stiffness of right knee, not elsewhere classified  Stiffness of left knee, not elsewhere classified     Problem List Patient Active Problem List   Diagnosis Date Noted  . Ischemia of right lower extremity 06/19/2017  . Malnutrition of moderate degree 06/16/2017  . Ischemic foot 06/15/2017  . Cellulitis 06/15/2017  . PAD (peripheral artery disease) ([place])   . Osteoporosis 11-May-2017  . Loss or death of child 2015/06/28  . Status post below-knee amputation of left lower  extremity ([place]) 01/31/2015  . Essential hypertension 08/22/2014    Jamey Reas PT, DPT 07/08/2018, 5:01 PM  [facility] [address] [place], [name], [address] Phone: [phone]   Fax:  [phone]  Name: OMARIA PLUNK MRN: [id number] Date of Birth: 1932/03/12

## 2018-07-13 ENCOUNTER — Telehealth: Payer: Self-pay | Admitting: *Deleted

## 2018-07-13 ENCOUNTER — Encounter: Payer: Self-pay | Admitting: Physical Therapy

## 2018-07-13 ENCOUNTER — Ambulatory Visit: Payer: Medicare Other | Attending: Family Medicine | Admitting: Physical Therapy

## 2018-07-13 DIAGNOSIS — M6281 Muscle weakness (generalized): Secondary | ICD-10-CM

## 2018-07-13 DIAGNOSIS — R2689 Other abnormalities of gait and mobility: Secondary | ICD-10-CM | POA: Insufficient documentation

## 2018-07-13 DIAGNOSIS — M25661 Stiffness of right knee, not elsewhere classified: Secondary | ICD-10-CM | POA: Diagnosis not present

## 2018-07-13 DIAGNOSIS — R2681 Unsteadiness on feet: Secondary | ICD-10-CM | POA: Insufficient documentation

## 2018-07-13 DIAGNOSIS — R293 Abnormal posture: Secondary | ICD-10-CM | POA: Diagnosis not present

## 2018-07-13 DIAGNOSIS — M25662 Stiffness of left knee, not elsewhere classified: Secondary | ICD-10-CM | POA: Insufficient documentation

## 2018-07-13 NOTE — Therapy (Signed)
[facility] [address] [place], [name], [address] Phone: [phone]   Fax:  [phone]  Physical Therapy Treatment  Patient Details  Name: [name] MRN: [id number] Date of Birth: 05-05-1932 Referring Provider (PT): Lamar Blinks, MD   Encounter Date: 07/13/2018  PT End of Session - 07/13/18 0852    Visit Number  12    Number of Visits  25    Date for PT Re-Evaluation  08/17/18    Authorization Type  Medicare 20% coinsurance after meeting deductible    PT Start Time  [phone]    PT Stop Time  0930    PT Time Calculation (min)  41 min    Equipment Utilized During Treatment  Gait belt    Activity Tolerance  Patient tolerated treatment well    Behavior During Therapy  [facility] for tasks assessed/performed       Past Medical History:  Diagnosis Date  . Arthritis   . Cancer ([place])    utertrine  . Hypertension   . Osteoporosis 04/15/2017  . PAD (peripheral artery disease) ([place])     Past Surgical History:  Procedure Laterality Date  . ABDOMINAL HYSTERECTOMY     partial  . AMPUTATION Left 01/29/2015   Procedure: Left  AMPUTATION BELOW KNEE;  Surgeon: Elam Dutch, MD;  Location: [facility];  Service: Vascular;  Laterality: Left;  . AMPUTATION Right 06/17/2017   Procedure: Right  BELOW KNEE Amputation;  Surgeon: Elam Dutch, MD;  Location: [facility];  Service: Vascular;  Laterality: Right;  . KNEE SURGERY Right 1984  . PERIPHERAL VASCULAR CATHETERIZATION N/A 09/15/2014   Procedure: Abdominal Aortogram;  Surgeon: Elam Dutch, MD;  Location: [facility] INVASIVE CV LAB CUPID;  Service: Cardiovascular;  Laterality: N/A;  . PERIPHERAL VASCULAR CATHETERIZATION Bilateral 09/15/2014   Procedure: Lower Extremity Angiography;  Surgeon: Elam Dutch, MD;  Location: [facility] INVASIVE CV LAB CUPID;  Service: Cardiovascular;  Laterality: Bilateral;  . SPINE SURGERY      There were no vitals filed for this visit.  Subjective  Assessment - 07/13/18 0851    Subjective  No new complaints. No falls  or pain to report. See's Chris next Tuesday after PT for socket fit/check.     Patient is accompained by:  Family member    Pertinent History  B TTTAs, PAD, HTN, arthritis, utertine CA, osteoporosis, Right knee sg 1984, spine sg    Limitations  Standing;Walking;House hold activities    Patient Stated Goals  Be able to walk around house and going out in community in grandson    Currently in Pain?  No/denies    Pain Score  0-No pain            [facility] Adult PT Treatment/Exercise - 07/13/18 0854      Transfers   Transfers  Sit to Stand;Stand to Sit    Sit to Stand  5: Supervision;With upper extremity assist;From chair/3-in-1    Sit to Stand Details  Visual cues for safe use of DME/AE;Verbal cues for safe use of DME/AE;Verbal cues for technique    Stand to Sit  5: Supervision;With upper extremity assist;To chair/3-in-1    Stand to Sit Details (indicate cue type and reason)  Visual cues for safe use of DME/AE;Verbal cues for safe use of DME/AE;Verbal cues for technique      Ambulation/Gait   Ambulation/Gait  Yes    Ambulation/Gait Assistance  4: Min guard;5: Supervision    Ambulation/Gait Assistance Details  with RW: >200 feet with use of tabs on walker as targets to work on incr base of support with gait. cues on posture. no imbalance noted or assist needed. with rollator: in/out of gym with min guard assist, cues on posture and bil step placement to incr base of support. in gym- used post its on seat for visual target for step placement with gait. reminder cues needed at times during gait to step to target.           Ambulation Distance (Feet)  220 Feet   x1, RW; 120 x 3, 100 x1 with rollator   Assistive device  Rolling walker;Rollator;Prostheses    Gait Pattern  Step-through pattern;Decreased step length - right;Decreased step length - left;Decreased stride length;Right flexed knee in stance;Left flexed knee in  stance;Wide base of support    Ambulation Surface  Level;Indoor    Ramp  4: Min assist    Ramp Details (indicate cue type and reason)  with rollator/prostheses: cues on posture, sequencing and step placement.             Curb  4: Min assist    Curb Details (indicate cue type and reason)  with rollator/prosthesis: cues on sequencing, stance position with moving rollator and to slow down for safety.       Prosthetics   Current prosthetic wear tolerance (days/week)   daily    Current prosthetic wear tolerance (#hours/day)   almost all awake hours.    Residual limb condition   No skin issues per pt and grandson    Education Provided  Correct ply sock adjustment;Residual limb care    Person(s) Educated  Patient;Caregiver(s)   grandson   Education Method  Explanation;Verbal cues;Demonstration    Education Method  Verbalized understanding;Verbal cues required;Needs further Photographer Prosthesis  Supervision           PT Short Term Goals - 07/13/18 0853      PT SHORT TERM GOAL #1   Title  Patient reports wear daily most of awake hours with limb pain </= 4/10. (All updated STGs Target Date: 07/16/2018)    Baseline  07/13/18: met to date    Status  Achieved    Target Date  07/16/18      PT SHORT TERM GOAL #2   Title  Sit to/from stand chairs without armrests to RW with supervision.     Baseline  07/13/18: met today    Time  --    Period  --    Status  Achieved    Target Date  07/16/18      PT SHORT TERM GOAL #3   Title  Patient ambulates 200' with RW & prostheses with supervision.     Baseline  07/13/18: met today with RW    Time  --    Period  --    Status  Achieved    Target Date  07/16/18      PT SHORT TERM GOAL #4   Title  Patient negotiates ramps & curbs with RW & prosthesis with supervision.     Baseline  07/13/18: pt continues to need min guard to min assist with RW or rollator, improved from eval just not to goal    Time  --     Period  --    Status  Partially Met    Target Date  07/16/18        PT Long Term Goals -  05/19/18 2207      PT LONG TERM GOAL #1   Title  Patient demonstrates / verbalizes safe, independent prosthetic care to enable prosthesis use without issues.  (All LTGs Target Date 08/13/2018)    Time  3    Period  Months    Status  New    Target Date  08/13/18      PT LONG TERM GOAL #2   Title  Patient tolerates wear of prostheses >75% of awake hours without skin issues & limb pain <4/10 to enable function throughout her day.     Time  3    Period  Months    Status  New    Target Date  08/13/18      PT LONG TERM GOAL #3   Title  Standing balance with rolling walker & bilateral prostheses reaching 5" anteriorly & laterally, scans environment with head & upper torso movements and manages clothes modified independent.     Time  3    Period  Months    Status  New    Target Date  08/13/18      PT LONG TERM GOAL #4   Title  Patient ambulates 16' with RW & bilateral prostheses around furniture modified independent to enable mobility in home.    Time  3    Period  Months    Status  New    Target Date  08/13/18      PT LONG TERM GOAL #5   Title  Patient ambulates 150' with RW & prostheses with grandson's assistance for community mobility.     Time  3    Period  Months    Status  New    Target Date  08/13/18      Additional Long Term Goals   Additional Long Term Goals  Yes      PT LONG TERM GOAL #6   Title  Patient negotiates ramps & curbs with RW & prostheses with grandson's assistance for community access.    Time  3    Period  Months    Status  New    Target Date  08/13/18            Plan - 07/13/18 1610    Clinical Impression Statement  Today's skilled session focused on progress toward STGs with 3/4 goals met, remaining goal partially met. The pt continues to need cues on step placement to incr base of support with gait, visual targets tend to help with this. The pt is  progressing toward goals and should beneift from continued PT to progress toward unmet goals.    Rehab Potential  Good    PT Frequency  2x / week    PT Duration  12 weeks    PT Treatment/Interventions  ADLs/Self Care Home Management;DME Instruction;Gait training;Stair training;Functional mobility training;Therapeutic activities;Therapeutic exercise;Balance training;Neuromuscular re-education;Patient/family education;Prosthetic Training;Manual techniques;Vestibular    PT Next Visit Plan  Continue to work on gait and barriers with rollator/prostheses toward LTGs.    Consulted and Agree with Plan of Care  Patient;Family member/caregiver    Family Member Consulted  grandson, [name]       Patient will benefit from skilled therapeutic intervention in order to improve the following deficits and impairments:  Abnormal gait, Decreased activity tolerance, Decreased balance, Decreased endurance, Decreased knowledge of use of DME, Decreased mobility, Decreased range of motion, Decreased scar mobility, Decreased strength, Dizziness, Impaired flexibility, Postural dysfunction, Prosthetic Dependency  Visit Diagnosis: Muscle weakness (generalized)  Unsteadiness on  feet  Other abnormalities of gait and mobility  Abnormal posture     Problem List Patient Active Problem List   Diagnosis Date Noted  . Ischemia of right lower extremity 06/19/2017  . Malnutrition of moderate degree 06/16/2017  . Ischemic foot 06/15/2017  . Cellulitis 06/15/2017  . PAD (peripheral artery disease) ([place])   . Osteoporosis 05/08/17  . Loss or death of child 06-25-2015  . Status post below-knee amputation of left lower extremity ([place]) 01/31/2015  . Essential hypertension 08/22/2014    Willow Ora, PTA, [facility] [address] [place], [place] [address] [phone] 07/13/18, 9:38 PM   Name: SCHERRIE SENECA MRN: [id number] Date of Birth: [birth year]

## 2018-07-13 NOTE — Telephone Encounter (Signed)
Received Physician Orders from [facility] for Letter/CMN for prosthetics for legs; forwarded to provider/SLS 03/03

## 2018-07-15 ENCOUNTER — Ambulatory Visit: Payer: Medicare Other | Admitting: Physical Therapy

## 2018-07-15 ENCOUNTER — Encounter: Payer: Self-pay | Admitting: Physical Therapy

## 2018-07-15 DIAGNOSIS — R2689 Other abnormalities of gait and mobility: Secondary | ICD-10-CM

## 2018-07-15 DIAGNOSIS — R2681 Unsteadiness on feet: Secondary | ICD-10-CM | POA: Diagnosis not present

## 2018-07-15 DIAGNOSIS — R293 Abnormal posture: Secondary | ICD-10-CM | POA: Diagnosis not present

## 2018-07-15 DIAGNOSIS — M6281 Muscle weakness (generalized): Secondary | ICD-10-CM

## 2018-07-15 DIAGNOSIS — M25662 Stiffness of left knee, not elsewhere classified: Secondary | ICD-10-CM | POA: Diagnosis not present

## 2018-07-15 DIAGNOSIS — M25661 Stiffness of right knee, not elsewhere classified: Secondary | ICD-10-CM | POA: Diagnosis not present

## 2018-07-15 NOTE — Therapy (Signed)
[facility] [address] [place], [name], [address] Phone: [phone]   Fax:  [phone]  Physical Therapy Treatment  Patient Details  Name: [name] MRN: [id number] Date of Birth: 01/31/1932 Referring Provider (PT): Lamar Blinks, MD   Encounter Date: 07/15/2018  PT End of Session - 07/15/18 1001    Visit Number  13    Number of Visits  25    Date for PT Re-Evaluation  08/17/18    Authorization Type  Medicare 20% coinsurance after meeting deductible    PT Start Time  [phone]    PT Stop Time  0940    PT Time Calculation (min)  54 min    Equipment Utilized During Treatment  Gait belt    Activity Tolerance  Patient tolerated treatment well    Behavior During Therapy  [facility] for tasks assessed/performed       Past Medical History:  Diagnosis Date  . Arthritis   . Cancer ([place])    utertrine  . Hypertension   . Osteoporosis 04/15/2017  . PAD (peripheral artery disease) ([place])     Past Surgical History:  Procedure Laterality Date  . ABDOMINAL HYSTERECTOMY     partial  . AMPUTATION Left 01/29/2015   Procedure: Left  AMPUTATION BELOW KNEE;  Surgeon: Elam Dutch, MD;  Location: [facility];  Service: Vascular;  Laterality: Left;  . AMPUTATION Right 06/17/2017   Procedure: Right  BELOW KNEE Amputation;  Surgeon: Elam Dutch, MD;  Location: [place];  Service: Vascular;  Laterality: Right;  . KNEE SURGERY Right 1984  . PERIPHERAL VASCULAR CATHETERIZATION N/A 09/15/2014   Procedure: Abdominal Aortogram;  Surgeon: Elam Dutch, MD;  Location: [facility] INVASIVE CV LAB CUPID;  Service: Cardiovascular;  Laterality: N/A;  . PERIPHERAL VASCULAR CATHETERIZATION Bilateral 09/15/2014   Procedure: Lower Extremity Angiography;  Surgeon: Elam Dutch, MD;  Location: [facility] INVASIVE CV LAB CUPID;  Service: Cardiovascular;  Laterality: Bilateral;  . SPINE SURGERY      There were no vitals filed for this visit.  Subjective  Assessment - 07/15/18 0845    Subjective  No falls. She only walks when her grandson is present.     Patient is accompained by:  Family member    Pertinent History  B TTTAs, PAD, HTN, arthritis, utertine CA, osteoporosis, Right knee sg 1984, spine sg    Limitations  Standing;Walking;House hold activities    Patient Stated Goals  Be able to walk around house and going out in community in grandson    Currently in Pain?  No/denies                       [facility] Adult PT Treatment/Exercise - 07/15/18 0845      Transfers   Transfers  Sit to Stand;Stand to Sit    Sit to Stand  5: Supervision;With upper extremity assist;From chair/3-in-1    Sit to Stand Details  Visual cues for safe use of DME/AE;Verbal cues for safe use of DME/AE;Verbal cues for technique    Stand to Sit  5: Supervision;With upper extremity assist;To chair/3-in-1    Stand to Sit Details (indicate cue type and reason)  Visual cues for safe use of DME/AE;Verbal cues for safe use of DME/AE;Verbal cues for technique      Ambulation/Gait   Ambulation/Gait  Yes    Ambulation/Gait Assistance  4: Min guard;5: Supervision    Ambulation/Gait Assistance Details  Pt entered /exited gym ambulating with  rollator walker with supervision. PT instructed in gait with [facility] & counter support. Pt to use SBQC to maneuver in her bathroom with [facility] and grab bar/counter support with grandson's assist.      Ambulation Distance (Feet)  100 Feet   100' X 2 rollator, 10' X 6 with SBQC/counter   Assistive device  Rollator;Prostheses;Small based quad cane    Gait Pattern  Step-through pattern;Decreased step length - right;Decreased step length - left;Decreased stride length;Right flexed knee in stance;Left flexed knee in stance;Wide base of support    Ambulation Surface  Indoor;Level      Self-Care   Self-Care  ADL's    ADL's  standing with back of legs against chair / toilet with SBQC on right side & counter of left side similar to her  bathroom: Pt able to pull pants down & up alternating UE support with supervision. PT cued to sit down to tie pants. Pt & grandson verbalized understanding & will work on skill at home.       Prosthetics   Prosthetic Care Comments   PT instructed to decrease socks first things in morning and adjust after she pumps fluid from limbs. PT instructed when to use cutoff vs full length socks.      Current prosthetic wear tolerance (days/week)   daily    Current prosthetic wear tolerance (#hours/day)   almost all awake hours.    Residual limb condition   no issues    Education Provided  Correct ply sock adjustment;Residual limb care    Person(s) Educated  Patient;Caregiver(s)   grandson   Education Method  Explanation;Demonstration;Verbal cues    Education Method  Verbalized understanding;Returned demonstration;Tactile cues required;Verbal cues required             PT Education - 07/15/18 0915    Education Details  improving confidence with prosthetic gait with rollator in home by grandson initially supervising in line of sight but not close and progressing to ear shot but not direct supervision.     Person(s) Educated  Patient;Caregiver(s)    Methods  Explanation;Verbal cues    Comprehension  Verbalized understanding;Verbal cues required;Need further instruction       PT Short Term Goals - 07/13/18 0853      PT SHORT TERM GOAL #1   Title  Patient reports wear daily most of awake hours with limb pain </= 4/10. (All updated STGs Target Date: 07/16/2018)    Baseline  07/13/18: met to date    Status  Achieved    Target Date  07/16/18      PT SHORT TERM GOAL #2   Title  Sit to/from stand chairs without armrests to RW with supervision.     Baseline  07/13/18: met today    Time  --    Period  --    Status  Achieved    Target Date  07/16/18      PT SHORT TERM GOAL #3   Title  Patient ambulates 200' with RW & prostheses with supervision.     Baseline  07/13/18: met today with RW    Time  --     Period  --    Status  Achieved    Target Date  07/16/18      PT SHORT TERM GOAL #4   Title  Patient negotiates ramps & curbs with RW & prosthesis with supervision.     Baseline  07/13/18: pt continues to need min guard to min assist with RW or rollator,  improved from eval just not to goal    Time  --    Period  --    Status  Partially Met    Target Date  07/16/18        PT Long Term Goals - 05/19/18 2207      PT LONG TERM GOAL #1   Title  Patient demonstrates / verbalizes safe, independent prosthetic care to enable prosthesis use without issues.  (All LTGs Target Date 08/13/2018)    Time  3    Period  Months    Status  New    Target Date  08/13/18      PT LONG TERM GOAL #2   Title  Patient tolerates wear of prostheses >75% of awake hours without skin issues & limb pain <4/10 to enable function throughout her day.     Time  3    Period  Months    Status  New    Target Date  08/13/18      PT LONG TERM GOAL #3   Title  Standing balance with rolling walker & bilateral prostheses reaching 5" anteriorly & laterally, scans environment with head & upper torso movements and manages clothes modified independent.     Time  3    Period  Months    Status  New    Target Date  08/13/18      PT LONG TERM GOAL #4   Title  Patient ambulates 75' with RW & bilateral prostheses around furniture modified independent to enable mobility in home.    Time  3    Period  Months    Status  New    Target Date  08/13/18      PT LONG TERM GOAL #5   Title  Patient ambulates 150' with RW & prostheses with grandson's assistance for community mobility.     Time  3    Period  Months    Status  New    Target Date  08/13/18      Additional Long Term Goals   Additional Long Term Goals  Yes      PT LONG TERM GOAL #6   Title  Patient negotiates ramps & curbs with RW & prostheses with grandson's assistance for community access.    Time  3    Period  Months    Status  New    Target Date  08/13/18             Plan - 07/15/18 1730    Clinical Impression Statement  PT instructed patient & grandson in how to improve confidence with ambulating in her home and appears to understand. PT also instructed in short distance gait with [facility] & counter top to enable mobility in her small bathroom. Patient to trial with grandson.     Rehab Potential  Good    PT Frequency  2x / week    PT Duration  12 weeks    PT Treatment/Interventions  ADLs/Self Care Home Management;DME Instruction;Gait training;Stair training;Functional mobility training;Therapeutic activities;Therapeutic exercise;Balance training;Neuromuscular re-education;Patient/family education;Prosthetic Training;Manual techniques;Vestibular    PT Next Visit Plan  Check on how [facility] use in bathroom went including managing her pants. Check if she is tolerating grandson not being as close during gait. Continue to work on gait and barriers with rollator/prostheses toward LTGs.    Consulted and Agree with Plan of Care  Patient;Family member/caregiver    Family Member Consulted  grandson, [name]       Patient will  benefit from skilled therapeutic intervention in order to improve the following deficits and impairments:  Abnormal gait, Decreased activity tolerance, Decreased balance, Decreased endurance, Decreased knowledge of use of DME, Decreased mobility, Decreased range of motion, Decreased scar mobility, Decreased strength, Dizziness, Impaired flexibility, Postural dysfunction, Prosthetic Dependency  Visit Diagnosis: Muscle weakness (generalized)  Unsteadiness on feet  Other abnormalities of gait and mobility  Abnormal posture     Problem List Patient Active Problem List   Diagnosis Date Noted  . Ischemia of right lower extremity 06/19/2017  . Malnutrition of moderate degree 06/16/2017  . Ischemic foot 06/15/2017  . Cellulitis 06/15/2017  . PAD (peripheral artery disease) ([place])   . Osteoporosis 04/20/2017  . Loss or death of child  June 07, 2015  . Status post below-knee amputation of left lower extremity ([place]) 01/31/2015  . Essential hypertension 08/22/2014    Jamey Reas PT, DPT 07/15/2018, 5:37 PM  [facility] [address] [place], [name], [address] Phone: [phone]   Fax:  [phone]  Name: TYLIYAH MCMEEKIN MRN: [id number] Date of Birth: April 06, 1932

## 2018-07-20 ENCOUNTER — Encounter: Payer: Self-pay | Admitting: Physical Therapy

## 2018-07-20 ENCOUNTER — Ambulatory Visit: Payer: Medicare Other | Admitting: Physical Therapy

## 2018-07-20 DIAGNOSIS — M25661 Stiffness of right knee, not elsewhere classified: Secondary | ICD-10-CM | POA: Diagnosis not present

## 2018-07-20 DIAGNOSIS — M6281 Muscle weakness (generalized): Secondary | ICD-10-CM

## 2018-07-20 DIAGNOSIS — R2681 Unsteadiness on feet: Secondary | ICD-10-CM

## 2018-07-20 DIAGNOSIS — R2689 Other abnormalities of gait and mobility: Secondary | ICD-10-CM | POA: Diagnosis not present

## 2018-07-20 DIAGNOSIS — R293 Abnormal posture: Secondary | ICD-10-CM

## 2018-07-20 DIAGNOSIS — M25662 Stiffness of left knee, not elsewhere classified: Secondary | ICD-10-CM | POA: Diagnosis not present

## 2018-07-20 NOTE — Therapy (Signed)
[facility] [address] [place], [name], [address] Phone: [phone]   Fax:  [phone]  Physical Therapy Treatment  Patient Details  Name: [name] MRN: [id number] Date of Birth: 15-Apr-1932 Referring Provider (PT): Lamar Blinks, MD   Encounter Date: 07/20/2018  PT End of Session - 07/20/18 1133    Visit Number  14    Number of Visits  25    Date for PT Re-Evaluation  08/17/18    Authorization Type  Medicare 20% coinsurance after meeting deductible    PT Start Time  0845    PT Stop Time  0930    PT Time Calculation (min)  45 min    Equipment Utilized During Treatment  Gait belt    Activity Tolerance  Patient tolerated treatment well    Behavior During Therapy  [facility] for tasks assessed/performed       Past Medical History:  Diagnosis Date  . Arthritis   . Cancer ([place])    utertrine  . Hypertension   . Osteoporosis 04/15/2017  . PAD (peripheral artery disease) ([place])     Past Surgical History:  Procedure Laterality Date  . ABDOMINAL HYSTERECTOMY     partial  . AMPUTATION Left 01/29/2015   Procedure: Left  AMPUTATION BELOW KNEE;  Surgeon: Elam Dutch, MD;  Location: [place];  Service: Vascular;  Laterality: Left;  . AMPUTATION Right 06/17/2017   Procedure: Right  BELOW KNEE Amputation;  Surgeon: Elam Dutch, MD;  Location: [place];  Service: Vascular;  Laterality: Right;  . KNEE SURGERY Right 1984  . PERIPHERAL VASCULAR CATHETERIZATION N/A 09/15/2014   Procedure: Abdominal Aortogram;  Surgeon: Elam Dutch, MD;  Location: [facility] INVASIVE CV LAB CUPID;  Service: Cardiovascular;  Laterality: N/A;  . PERIPHERAL VASCULAR CATHETERIZATION Bilateral 09/15/2014   Procedure: Lower Extremity Angiography;  Surgeon: Elam Dutch, MD;  Location: [facility] INVASIVE CV LAB CUPID;  Service: Cardiovascular;  Laterality: Bilateral;  . SPINE SURGERY      There were no vitals filed for this visit.  Subjective  Assessment - 07/20/18 0845    Subjective  She tried [facility] in bathroom with grandson. She found rollator walker fits in bathroom.     Patient is accompained by:  Family member    Pertinent History  B TTTAs, PAD, HTN, arthritis, utertine CA, osteoporosis, Right knee sg 1984, spine sg    Limitations  Standing;Walking;House hold activities    Patient Stated Goals  Be able to walk around house and going out in community in grandson    Currently in Pain?  No/denies                       [facility] Adult PT Treatment/Exercise - 07/20/18 0845      Transfers   Transfers  Sit to Stand;Stand to Sit    Sit to Stand  5: Supervision;With upper extremity assist;From chair/3-in-1   to rollator walker   Sit to Stand Details  Visual cues for safe use of DME/AE;Verbal cues for safe use of DME/AE;Verbal cues for technique    Stand to Sit  5: Supervision;With upper extremity assist;To chair/3-in-1   from rollator walker   Stand to Sit Details (indicate cue type and reason)  Visual cues for safe use of DME/AE;Verbal cues for safe use of DME/AE;Verbal cues for technique    Stand Pivot Transfers  5: Supervision   turns 180* sit/stand rollator seat in open area   Stand  Pivot Transfer Details (indicate cue type and reason)  verbal cues on safety / technique      Ambulation/Gait   Ambulation/Gait  Yes    Ambulation/Gait Assistance  5: Supervision    Ambulation/Gait Assistance Details  Verbal cues on upright posture    Ambulation Distance (Feet)  100 Feet   100' X 3   Assistive device  Rollator;Prostheses;Small based quad cane    Gait Pattern  Step-through pattern;Decreased step length - right;Decreased step length - left;Decreased stride length;Right flexed knee in stance;Left flexed knee in stance;Wide base of support    Ambulation Surface  Indoor;Level    Ramp  4: Min assist   rollator / prostheses   Ramp Details (indicate cue type and reason)  verbal & tactile cues on technique & rollator control  / management    Curb  4: Min assist   rollator & prostheses   Curb Details (indicate cue type and reason)  verbal cues on technique / foot position descending for knee control.  Manual assist needed to lift rollator up curb.       Self-Care   Self-Care  ADL's    ADL's  use BSC as raised toilet seat & height to enable feet support on floor      Prosthetics   Prosthetic Care Comments   skin check 2-3 times/day with new socket that she is scheduled to recieve today after PT; use of baby oil at patella to reduce friction rub;  consider using old socket at water leg for showering & potentially pool in summer.     Current prosthetic wear tolerance (days/week)   daily    Current prosthetic wear tolerance (#hours/day)   almost all awake hours.    Residual limb condition   no issues    Education Provided  Correct ply sock adjustment;Residual limb care;Other (comment)   see prosthetic care comments   Person(s) Educated  Patient;Caregiver(s)   grandson   Education Method  Explanation;Verbal cues;Demonstration    Education Method  Verbalized understanding;Needs further instruction               PT Short Term Goals - 07/13/18 0853      PT SHORT TERM GOAL #1   Title  Patient reports wear daily most of awake hours with limb pain </= 4/10. (All updated STGs Target Date: 07/16/2018)    Baseline  07/13/18: met to date    Status  Achieved    Target Date  07/16/18      PT SHORT TERM GOAL #2   Title  Sit to/from stand chairs without armrests to RW with supervision.     Baseline  07/13/18: met today    Time  --    Period  --    Status  Achieved    Target Date  07/16/18      PT SHORT TERM GOAL #3   Title  Patient ambulates 200' with RW & prostheses with supervision.     Baseline  07/13/18: met today with RW    Time  --    Period  --    Status  Achieved    Target Date  07/16/18      PT SHORT TERM GOAL #4   Title  Patient negotiates ramps & curbs with RW & prosthesis with supervision.      Baseline  07/13/18: pt continues to need min guard to min assist with RW or rollator, improved from eval just not to goal    Time  --  Period  --    Status  Partially Met    Target Date  07/16/18        PT Long Term Goals - 05/19/18 2207      PT LONG TERM GOAL #1   Title  Patient demonstrates / verbalizes safe, independent prosthetic care to enable prosthesis use without issues.  (All LTGs Target Date 08/13/2018)    Time  3    Period  Months    Status  New    Target Date  08/13/18      PT LONG TERM GOAL #2   Title  Patient tolerates wear of prostheses >75% of awake hours without skin issues & limb pain <4/10 to enable function throughout her day.     Time  3    Period  Months    Status  New    Target Date  08/13/18      PT LONG TERM GOAL #3   Title  Standing balance with rolling walker & bilateral prostheses reaching 5" anteriorly & laterally, scans environment with head & upper torso movements and manages clothes modified independent.     Time  3    Period  Months    Status  New    Target Date  08/13/18      PT LONG TERM GOAL #4   Title  Patient ambulates 37' with RW & bilateral prostheses around furniture modified independent to enable mobility in home.    Time  3    Period  Months    Status  New    Target Date  08/13/18      PT LONG TERM GOAL #5   Title  Patient ambulates 150' with RW & prostheses with grandson's assistance for community mobility.     Time  3    Period  Months    Status  New    Target Date  08/13/18      Additional Long Term Goals   Additional Long Term Goals  Yes      PT LONG TERM GOAL #6   Title  Patient negotiates ramps & curbs with RW & prostheses with grandson's assistance for community access.    Time  3    Period  Months    Status  New    Target Date  08/13/18            Plan - 07/20/18 1207    Clinical Impression Statement  PT educated patient & grandson in using [facility] as raised toilet seat. She reports instruction last session  with managing pants standing has helped a lot. Patient is on target to meet LTGs on time.     Rehab Potential  Good    PT Frequency  2x / week    PT Duration  12 weeks    PT Treatment/Interventions  ADLs/Self Care Home Management;DME Instruction;Gait training;Stair training;Functional mobility training;Therapeutic activities;Therapeutic exercise;Balance training;Neuromuscular re-education;Patient/family education;Prosthetic Training;Manual techniques;Vestibular    PT Next Visit Plan  check how new socket is fitting & skin check, ask about BSC as raised toilet. Work towards [employer].     Consulted and Agree with Plan of Care  Patient;Family member/caregiver    Family Member Consulted  grandson, [name]       Patient will benefit from skilled therapeutic intervention in order to improve the following deficits and impairments:  Abnormal gait, Decreased activity tolerance, Decreased balance, Decreased endurance, Decreased knowledge of use of DME, Decreased mobility, Decreased range of motion, Decreased scar mobility, Decreased strength,  Dizziness, Impaired flexibility, Postural dysfunction, Prosthetic Dependency  Visit Diagnosis: Muscle weakness (generalized)  Unsteadiness on feet  Other abnormalities of gait and mobility  Abnormal posture  Stiffness of right knee, not elsewhere classified  Stiffness of left knee, not elsewhere classified     Problem List Patient Active Problem List   Diagnosis Date Noted  . Ischemia of right lower extremity 06/19/2017  . Malnutrition of moderate degree 06/16/2017  . Ischemic foot 06/15/2017  . Cellulitis 06/15/2017  . PAD (peripheral artery disease) ([place])   . Osteoporosis May 12, 2017  . Loss or death of child Jun 29, 2015  . Status post below-knee amputation of left lower extremity ([place]) 01/31/2015  . Essential hypertension 08/22/2014    Jamey Reas PT, DPT 07/20/2018, 12:10 PM  [facility] [address] [place], [name], [address] Phone: [phone]   Fax:  [phone]  Name: BETZABETH DERRINGER MRN: [id number] Date of Birth: 02-26-1932

## 2018-07-22 ENCOUNTER — Ambulatory Visit: Payer: Medicare Other | Admitting: Physical Therapy

## 2018-07-22 ENCOUNTER — Encounter: Payer: Self-pay | Admitting: Physical Therapy

## 2018-07-22 DIAGNOSIS — M25662 Stiffness of left knee, not elsewhere classified: Secondary | ICD-10-CM | POA: Diagnosis not present

## 2018-07-22 DIAGNOSIS — R2681 Unsteadiness on feet: Secondary | ICD-10-CM | POA: Diagnosis not present

## 2018-07-22 DIAGNOSIS — R293 Abnormal posture: Secondary | ICD-10-CM

## 2018-07-22 DIAGNOSIS — M6281 Muscle weakness (generalized): Secondary | ICD-10-CM | POA: Diagnosis not present

## 2018-07-22 DIAGNOSIS — M25661 Stiffness of right knee, not elsewhere classified: Secondary | ICD-10-CM | POA: Diagnosis not present

## 2018-07-22 DIAGNOSIS — R2689 Other abnormalities of gait and mobility: Secondary | ICD-10-CM

## 2018-07-22 NOTE — Therapy (Signed)
[facility] [address] [place], [name], [address] Phone: [phone]   Fax:  [phone]  Physical Therapy Treatment  Patient Details  Name: [name] MRN: [id number] Date of Birth: 1932-02-18 Referring Provider (PT): Lamar Blinks, MD   Encounter Date: 07/22/2018  PT End of Session - 07/22/18 1012    Visit Number  15    Number of Visits  25    Date for PT Re-Evaluation  08/17/18    Authorization Type  Medicare 20% coinsurance after meeting deductible    PT Start Time  [phone]    PT Stop Time  0930    PT Time Calculation (min)  43 min    Equipment Utilized During Treatment  Gait belt    Activity Tolerance  Patient tolerated treatment well    Behavior During Therapy  [facility] for tasks assessed/performed       Past Medical History:  Diagnosis Date  . Arthritis   . Cancer ([place])    utertrine  . Hypertension   . Osteoporosis 04/15/2017  . PAD (peripheral artery disease) ([place])     Past Surgical History:  Procedure Laterality Date  . ABDOMINAL HYSTERECTOMY     partial  . AMPUTATION Left 01/29/2015   Procedure: Left  AMPUTATION BELOW KNEE;  Surgeon: Elam Dutch, MD;  Location: [place];  Service: Vascular;  Laterality: Left;  . AMPUTATION Right 06/17/2017   Procedure: Right  BELOW KNEE Amputation;  Surgeon: Elam Dutch, MD;  Location: [place];  Service: Vascular;  Laterality: Right;  . KNEE SURGERY Right 1984  . PERIPHERAL VASCULAR CATHETERIZATION N/A 09/15/2014   Procedure: Abdominal Aortogram;  Surgeon: Elam Dutch, MD;  Location: [facility] INVASIVE CV LAB CUPID;  Service: Cardiovascular;  Laterality: N/A;  . PERIPHERAL VASCULAR CATHETERIZATION Bilateral 09/15/2014   Procedure: Lower Extremity Angiography;  Surgeon: Elam Dutch, MD;  Location: [place] INVASIVE CV LAB CUPID;  Service: Cardiovascular;  Laterality: Bilateral;  . SPINE SURGERY      There were no vitals filed for this visit.  Subjective  Assessment - 07/22/18 1011    Subjective  Getting another new socket as this one is still too big. No falls. Grandson reports finding her standing in bathroom on her own     Patient is accompained by:  Family member    Pertinent History  B TTTAs, PAD, HTN, arthritis, utertine CA, osteoporosis, Right knee sg 1984, spine sg    Limitations  Standing;Walking;House hold activities    Patient Stated Goals  Be able to walk around house and going out in community in grandson    Currently in Pain?  No/denies    Pain Score  0-No pain        Treatment:  Skin is intact with new socket.   With rollator/bil prostheses: 500 feet with rollator on indoor/outdoor paved surfaces. Cues for incr base of support, step placement and posture. Up/down outdoor curb with rollator/bil prostheses with min guard assist, cues to slow down for safety. HR 96 afterwards when seated in gym.  Gait around gym working on furniture/obstacle negotiation, then along hallway with head turns left<>fwd<>right, then up<>fwd<>down for 1 lap each. Min guard assist for balance, continued cues for incr base of support and step placement.   Stairs:  With 2 rails: up reciprocally with min guard assist. Descended with step down pattern, alternating lead leg, min guard assist. Pt educated on how step to pattern with conserve energy vs the reciprocal pattern.  With single rail: up sideways with cues for modified tandem stance. Cues needed for which leg to lead with and for step placement.         PT Short Term Goals - 07/13/18 0350      PT SHORT TERM GOAL #1   Title  Patient reports wear daily most of awake hours with limb pain </= 4/10. (All updated STGs Target Date: 07/16/2018)    Baseline  07/13/18: met to date    Status  Achieved    Target Date  07/16/18      PT SHORT TERM GOAL #2   Title  Sit to/from stand chairs without armrests to RW with supervision.     Baseline  07/13/18: met today    Time  --    Period  --    Status   Achieved    Target Date  07/16/18      PT SHORT TERM GOAL #3   Title  Patient ambulates 200' with RW & prostheses with supervision.     Baseline  07/13/18: met today with RW    Time  --    Period  --    Status  Achieved    Target Date  07/16/18      PT SHORT TERM GOAL #4   Title  Patient negotiates ramps & curbs with RW & prosthesis with supervision.     Baseline  07/13/18: pt continues to need min guard to min assist with RW or rollator, improved from eval just not to goal    Time  --    Period  --    Status  Partially Met    Target Date  07/16/18        PT Long Term Goals - 05/19/18 2207      PT LONG TERM GOAL #1   Title  Patient demonstrates / verbalizes safe, independent prosthetic care to enable prosthesis use without issues.  (All LTGs Target Date 08/13/2018)    Time  3    Period  Months    Status  New    Target Date  08/13/18      PT LONG TERM GOAL #2   Title  Patient tolerates wear of prostheses >75% of awake hours without skin issues & limb pain <4/10 to enable function throughout her day.     Time  3    Period  Months    Status  New    Target Date  08/13/18      PT LONG TERM GOAL #3   Title  Standing balance with rolling walker & bilateral prostheses reaching 5" anteriorly & laterally, scans environment with head & upper torso movements and manages clothes modified independent.     Time  3    Period  Months    Status  New    Target Date  08/13/18      PT LONG TERM GOAL #4   Title  Patient ambulates 47' with RW & bilateral prostheses around furniture modified independent to enable mobility in home.    Time  3    Period  Months    Status  New    Target Date  08/13/18      PT LONG TERM GOAL #5   Title  Patient ambulates 150' with RW & prostheses with grandson's assistance for community mobility.     Time  3    Period  Months    Status  New    Target Date  08/13/18  Additional Long Term Goals   Additional Long Term Goals  Yes      PT LONG TERM GOAL #6    Title  Patient negotiates ramps & curbs with RW & prostheses with grandson's assistance for community access.    Time  3    Period  Months    Status  New    Target Date  08/13/18            Plan - 07/22/18 1012    Clinical Impression Statement  Today's skilled session focused on gait/barriers with bil prostheses/rollator with no issues noted. The pt does need cues for safety at times and did demonstrate some impulsivity with session today. The pt is progressing toward goals and should benefit from continued PT to progress toward unmet goals.    Rehab Potential  Good    PT Frequency  2x / week    PT Duration  12 weeks    PT Treatment/Interventions  ADLs/Self Care Home Management;DME Instruction;Gait training;Stair training;Functional mobility training;Therapeutic activities;Therapeutic exercise;Balance training;Neuromuscular re-education;Patient/family education;Prosthetic Training;Manual techniques;Vestibular    PT Next Visit Plan  check how new socket is fitting & skin check, ask about BSC as raised toilet. Work towards [employer].     Consulted and Agree with Plan of Care  Patient;Family member/caregiver    Family Member Consulted  grandson, [name]       Patient will benefit from skilled therapeutic intervention in order to improve the following deficits and impairments:  Abnormal gait, Decreased activity tolerance, Decreased balance, Decreased endurance, Decreased knowledge of use of DME, Decreased mobility, Decreased range of motion, Decreased scar mobility, Decreased strength, Dizziness, Impaired flexibility, Postural dysfunction, Prosthetic Dependency  Visit Diagnosis: Muscle weakness (generalized)  Unsteadiness on feet  Other abnormalities of gait and mobility  Abnormal posture     Problem List Patient Active Problem List   Diagnosis Date Noted  . Ischemia of right lower extremity 06/19/2017  . Malnutrition of moderate degree 06/16/2017  . Ischemic foot 06/15/2017  .  Cellulitis 06/15/2017  . PAD (peripheral artery disease) ([place])   . Osteoporosis 2017-04-23  . Loss or death of child Jun 10, 2015  . Status post below-knee amputation of left lower extremity ([place]) 01/31/2015  . Essential hypertension 08/22/2014    Willow Ora, PTA, [facility] [address], [place], [place] [address] [phone] 07/22/18, 10:16 AM   Name: [name] MRN: [id number] Date of Birth: Oct 03, 1931

## 2018-07-27 ENCOUNTER — Encounter: Payer: Self-pay | Admitting: Physical Therapy

## 2018-07-27 ENCOUNTER — Ambulatory Visit: Payer: Medicare Other | Admitting: Physical Therapy

## 2018-07-27 DIAGNOSIS — R2689 Other abnormalities of gait and mobility: Secondary | ICD-10-CM | POA: Diagnosis not present

## 2018-07-27 DIAGNOSIS — R2681 Unsteadiness on feet: Secondary | ICD-10-CM

## 2018-07-27 DIAGNOSIS — M6281 Muscle weakness (generalized): Secondary | ICD-10-CM

## 2018-07-27 DIAGNOSIS — M25661 Stiffness of right knee, not elsewhere classified: Secondary | ICD-10-CM | POA: Diagnosis not present

## 2018-07-27 DIAGNOSIS — M25662 Stiffness of left knee, not elsewhere classified: Secondary | ICD-10-CM | POA: Diagnosis not present

## 2018-07-27 DIAGNOSIS — R293 Abnormal posture: Secondary | ICD-10-CM

## 2018-07-27 NOTE — Therapy (Signed)
[facility] [address] [place], [name], [address] Phone: [phone]   Fax:  [phone]  Physical Therapy Treatment  Patient Details  Name: [name] MRN: [id number] Date of Birth: 12-25-1931 Referring Provider (PT): Lamar Blinks, MD   Encounter Date: 07/27/2018  PT End of Session - 07/27/18 0852    Visit Number  16    Number of Visits  25    Date for PT Re-Evaluation  08/17/18    Authorization Type  Medicare 20% coinsurance after meeting deductible    PT Start Time  [phone]    PT Stop Time  0930    PT Time Calculation (min)  43 min    Equipment Utilized During Treatment  Gait belt    Activity Tolerance  Patient tolerated treatment well    Behavior During Therapy  [facility] for tasks assessed/performed       Past Medical History:  Diagnosis Date  . Arthritis   . Cancer ([place])    utertrine  . Hypertension   . Osteoporosis 04/15/2017  . PAD (peripheral artery disease) ([place])     Past Surgical History:  Procedure Laterality Date  . ABDOMINAL HYSTERECTOMY     partial  . AMPUTATION Left 01/29/2015   Procedure: Left  AMPUTATION BELOW KNEE;  Surgeon: Elam Dutch, MD;  Location: [place];  Service: Vascular;  Laterality: Left;  . AMPUTATION Right 06/17/2017   Procedure: Right  BELOW KNEE Amputation;  Surgeon: Elam Dutch, MD;  Location: [place];  Service: Vascular;  Laterality: Right;  . KNEE SURGERY Right 1984  . PERIPHERAL VASCULAR CATHETERIZATION N/A 09/15/2014   Procedure: Abdominal Aortogram;  Surgeon: Elam Dutch, MD;  Location: [facility] INVASIVE CV LAB CUPID;  Service: Cardiovascular;  Laterality: N/A;  . PERIPHERAL VASCULAR CATHETERIZATION Bilateral 09/15/2014   Procedure: Lower Extremity Angiography;  Surgeon: Elam Dutch, MD;  Location: [place] INVASIVE CV LAB CUPID;  Service: Cardiovascular;  Laterality: Bilateral;  . SPINE SURGERY      There were no vitals filed for this visit.  Subjective  Assessment - 07/27/18 0851    Subjective  No falls. Use of BSC over toilet is going well.     Patient is accompained by:  Family member   grandson and great granddaughter   Pertinent History  B TTTAs, PAD, HTN, arthritis, utertine CA, osteoporosis, Right knee sg 1984, spine sg    Limitations  Standing;Walking;House hold activities    Patient Stated Goals  Be able to walk around house and going out in community in grandson    Currently in Pain?  No/denies    Pain Score  0-No pain            [facility] Adult PT Treatment/Exercise - 07/27/18 0853      Transfers   Transfers  Sit to Stand;Stand to Sit    Sit to Stand  5: Supervision;With upper extremity assist;From chair/3-in-1    Stand to Sit  5: Supervision;With upper extremity assist;To chair/3-in-1      Ambulation/Gait   Ambulation/Gait  Yes    Ambulation/Gait Assistance  5: Supervision    Ambulation/Gait Assistance Details  cues on posture. use of visual targets on rollator for correct step placement.     Ambulation Distance (Feet)  380 Feet   x1   Assistive device  Rollator;Prosthesis    Gait Pattern  Step-through pattern;Decreased step length - right;Decreased step length - left;Decreased stride length;Right flexed knee in stance;Left flexed knee in stance;Wide base  of support    Ambulation Surface  Level;Indoor    Ramp  4: Min assist    Ramp Details (indicate cue type and reason)  with rollator/prosthesis- cues on posture and technique.     Curb  4: Min assist    Curb Details (indicate cue type and reason)  with rollator/prosthesis- assist to advance rollator safely and for stance position for balance      High Level Balance   High Level Balance Activities  Negotitating around obstacles;Negotiating over obstacles    High Level Balance Comments  with rollator/prostheses: figure 8's around 2 hoola hoops x 2 laps with cues on step length, rollator management and posture; fwd stepping over 3 small bolsters x 2 laps with cues on  sequencing and technique.       Prosthetics   Current prosthetic wear tolerance (days/week)   daily    Current prosthetic wear tolerance (#hours/day)   almost all awake hours.    Residual limb condition   no issues    Donning Prosthesis  Supervision    Doffing Prosthesis  Supervision               PT Short Term Goals - 07/13/18 8811      PT SHORT TERM GOAL #1   Title  Patient reports wear daily most of awake hours with limb pain </= 4/10. (All updated STGs Target Date: 07/16/2018)    Baseline  07/13/18: met to date    Status  Achieved    Target Date  07/16/18      PT SHORT TERM GOAL #2   Title  Sit to/from stand chairs without armrests to RW with supervision.     Baseline  07/13/18: met today    Time  --    Period  --    Status  Achieved    Target Date  07/16/18      PT SHORT TERM GOAL #3   Title  Patient ambulates 200' with RW & prostheses with supervision.     Baseline  07/13/18: met today with RW    Time  --    Period  --    Status  Achieved    Target Date  07/16/18      PT SHORT TERM GOAL #4   Title  Patient negotiates ramps & curbs with RW & prosthesis with supervision.     Baseline  07/13/18: pt continues to need min guard to min assist with RW or rollator, improved from eval just not to goal    Time  --    Period  --    Status  Partially Met    Target Date  07/16/18        PT Long Term Goals - 05/19/18 2207      PT LONG TERM GOAL #1   Title  Patient demonstrates / verbalizes safe, independent prosthetic care to enable prosthesis use without issues.  (All LTGs Target Date 08/13/2018)    Time  3    Period  Months    Status  New    Target Date  08/13/18      PT LONG TERM GOAL #2   Title  Patient tolerates wear of prostheses >75% of awake hours without skin issues & limb pain <4/10 to enable function throughout her day.     Time  3    Period  Months    Status  New    Target Date  08/13/18      PT LONG  TERM GOAL #3   Title  Standing balance with rolling  walker & bilateral prostheses reaching 5" anteriorly & laterally, scans environment with head & upper torso movements and manages clothes modified independent.     Time  3    Period  Months    Status  New    Target Date  08/13/18      PT LONG TERM GOAL #4   Title  Patient ambulates 40' with RW & bilateral prostheses around furniture modified independent to enable mobility in home.    Time  3    Period  Months    Status  New    Target Date  08/13/18      PT LONG TERM GOAL #5   Title  Patient ambulates 150' with RW & prostheses with grandson's assistance for community mobility.     Time  3    Period  Months    Status  New    Target Date  08/13/18      Additional Long Term Goals   Additional Long Term Goals  Yes      PT LONG TERM GOAL #6   Title  Patient negotiates ramps & curbs with RW & prostheses with grandson's assistance for community access.    Time  3    Period  Months    Status  New    Target Date  08/13/18            Plan - 07/27/18 0853    Clinical Impression Statement  Today's skilled focused use of rollator/prostheses with gait and barries. The pt is making steady progress toward goals and should benefit from continued PT to progress toward unmet goals.    Rehab Potential  Good    PT Frequency  2x / week    PT Duration  12 weeks    PT Treatment/Interventions  ADLs/Self Care Home Management;DME Instruction;Gait training;Stair training;Functional mobility training;Therapeutic activities;Therapeutic exercise;Balance training;Neuromuscular re-education;Patient/family education;Prosthetic Training;Manual techniques;Vestibular    PT Next Visit Plan  Work towards [place].     Consulted and Agree with Plan of Care  Patient;Family member/caregiver    Family Member Consulted  grandson, [name]       Patient will benefit from skilled therapeutic intervention in order to improve the following deficits and impairments:  Abnormal gait, Decreased activity tolerance, Decreased  balance, Decreased endurance, Decreased knowledge of use of DME, Decreased mobility, Decreased range of motion, Decreased scar mobility, Decreased strength, Dizziness, Impaired flexibility, Postural dysfunction, Prosthetic Dependency  Visit Diagnosis: Muscle weakness (generalized)  Unsteadiness on feet  Other abnormalities of gait and mobility  Abnormal posture     Problem List Patient Active Problem List   Diagnosis Date Noted  . Ischemia of right lower extremity 06/19/2017  . Malnutrition of moderate degree 06/16/2017  . Ischemic foot 06/15/2017  . Cellulitis 06/15/2017  . PAD (peripheral artery disease) ([place])   . Osteoporosis 05/03/17  . Loss or death of child 06-20-2015  . Status post below-knee amputation of left lower extremity ([place]) 01/31/2015  . Essential hypertension 08/22/2014    Willow Ora, PTA, [facility] [address] [place], [place] [address] [phone] 07/27/18, 6:07 PM   Name: BETSAIDA MISSOURI MRN: [id number] Date of Birth: November 27, 1931

## 2018-07-29 ENCOUNTER — Encounter: Payer: Self-pay | Admitting: Physical Therapy

## 2018-07-29 ENCOUNTER — Ambulatory Visit: Payer: Medicare Other | Admitting: Physical Therapy

## 2018-07-29 DIAGNOSIS — M25661 Stiffness of right knee, not elsewhere classified: Secondary | ICD-10-CM

## 2018-07-29 DIAGNOSIS — R2689 Other abnormalities of gait and mobility: Secondary | ICD-10-CM

## 2018-07-29 DIAGNOSIS — R293 Abnormal posture: Secondary | ICD-10-CM | POA: Diagnosis not present

## 2018-07-29 DIAGNOSIS — R2681 Unsteadiness on feet: Secondary | ICD-10-CM | POA: Diagnosis not present

## 2018-07-29 DIAGNOSIS — M6281 Muscle weakness (generalized): Secondary | ICD-10-CM | POA: Diagnosis not present

## 2018-07-29 DIAGNOSIS — M25662 Stiffness of left knee, not elsewhere classified: Secondary | ICD-10-CM | POA: Diagnosis not present

## 2018-07-29 NOTE — Therapy (Signed)
[facility] [address] [place], [name], [address] Phone: [phone]   Fax:  [phone]  Physical Therapy Treatment  Patient Details  Name: [name] MRN: [id number] Date of Birth: [birth year] Referring Provider (PT): Lamar Blinks, MD   Encounter Date: 07/29/2018  PT End of Session - 07/29/18 1115    Visit Number  17    Number of Visits  25    Date for PT Re-Evaluation  08/17/18    Authorization Type  Medicare 20% coinsurance after meeting deductible    PT Start Time  1015    PT Stop Time  1057    PT Time Calculation (min)  42 min    Equipment Utilized During Treatment  Gait belt    Activity Tolerance  Patient tolerated treatment well    Behavior During Therapy  [facility] for tasks assessed/performed       Past Medical History:  Diagnosis Date  . Arthritis   . 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 Hypertension   . Osteoporosis 04/15/2017  . PAD (peripheral artery disease) ([place])     Past Surgical History:  Procedure Laterality Date  . ABDOMINAL HYSTERECTOMY     partial  . AMPUTATION Left 01/29/2015   Procedure: Left  AMPUTATION BELOW KNEE;  Surgeon: Elam Dutch, MD;  Location: [facility];  Service: Vascular;  Laterality: Left;  . AMPUTATION Right 06/17/2017   Procedure: Right  BELOW KNEE Amputation;  Surgeon: Elam Dutch, MD;  Location: [facility];  Service: Vascular;  Laterality: Right;  . KNEE SURGERY Right 1984  . PERIPHERAL VASCULAR CATHETERIZATION N/A 09/15/2014   Procedure: Abdominal Aortogram;  Surgeon: Elam Dutch, MD;  Location: [facility] INVASIVE CV LAB CUPID;  Service: Cardiovascular;  Laterality: N/A;  . PERIPHERAL VASCULAR CATHETERIZATION Bilateral 09/15/2014   Procedure: Lower Extremity Angiography;  Surgeon: Elam Dutch, MD;  Location: [facility] INVASIVE CV LAB CUPID;  Service: Cardiovascular;  Laterality: Bilateral;  . SPINE SURGERY      There were no vitals filed for this visit.  Subjective  Assessment - 07/29/18 1015    Subjective  They found a narrow BSC to put in her bathroom but have not ordered it yet.     Patient is accompained by:  Family member   grandson and great granddaughter   Pertinent History  B TTTAs, PAD, HTN, arthritis, utertine CA, osteoporosis, Right knee sg 1984, spine sg    Limitations  Standing;Walking;House hold activities    Patient Stated Goals  Be able to walk around house and going out in community in grandson    Currently in Pain?  No/denies                       [facility] Adult PT Treatment/Exercise - 07/29/18 1015      Transfers   Transfers  Sit to Stand;Stand to Sit;Floor to Transfer    Sit to Stand  5: Supervision;With upper extremity assist;From chair/3-in-1    Stand to Sit  5: Supervision;With upper extremity assist;To chair/3-in-1    Floor to Transfer  4: Min assist;With upper extremity assist;From chair/3-in-1    Floor to Transfer Details (indicate cue type and reason)  Initially attempted via half kneeling but patient unable to tolerate either knee at 90* position.  Switched to plank press up and walking feet forward.       Ambulation/Gait   Ambulation/Gait  Yes    Ambulation/Gait Assistance  5: Supervision  Ambulation Distance (Feet)  380 Feet   x1   Assistive device  Rollator;Prosthesis    Gait Pattern  Step-through pattern;Decreased step length - right;Decreased step length - left;Decreased stride length;Right flexed knee in stance;Left flexed knee in stance;Wide base of support    Ramp  --    Curb  --      High Level Balance   High Level Balance Activities  --    High Level Balance Comments  --      Prosthetics   Prosthetic Care Comments   how to weigh (get on/off scale at home or MD office);  BMI calculator with Amput[employer] Calculator or std including wt of prostheses.     Current prosthetic wear tolerance (days/week)   daily    Current prosthetic wear tolerance (#hours/day)   almost all awake hours.     Residual limb condition   no issues    Education Provided  Other (comment)   see prosthetic care comments   Person(s) Educated  Patient;Caregiver(s)    Education Method  Explanation;Demonstration;Verbal cues    Education Method  Verbalized understanding    Donning Prosthesis  Modified independent (device/increased time)    Doffing Prosthesis  Modified independent (device/increased time)             PT Education - 07/29/18 1113    Education Details  BMI with amputations, corner HEP for balance    Person(s) Educated  Caregiver(s);Patient    Methods  Explanation;Demonstration;Verbal cues;Handout    Comprehension  Verbalized understanding;Returned demonstration;Verbal cues required;Tactile cues required;Need further instruction       PT Short Term Goals - 07/13/18 0853      PT SHORT TERM GOAL #1   Title  Patient reports wear daily most of awake hours with limb pain </= 4/10. (All updated STGs Target Date: 07/16/2018)    Baseline  07/13/18: met to date    Status  Achieved    Target Date  07/16/18      PT SHORT TERM GOAL #2   Title  Sit to/from stand chairs without armrests to RW with supervision.     Baseline  07/13/18: met today    Time  --    Period  --    Status  Achieved    Target Date  07/16/18      PT SHORT TERM GOAL #3   Title  Patient ambulates 200' with RW & prostheses with supervision.     Baseline  07/13/18: met today with RW    Time  --    Period  --    Status  Achieved    Target Date  07/16/18      PT SHORT TERM GOAL #4   Title  Patient negotiates ramps & curbs with RW & prosthesis with supervision.     Baseline  07/13/18: pt continues to need min guard to min assist with RW or rollator, improved from eval just not to goal    Time  --    Period  --    Status  Partially Met    Target Date  07/16/18        PT Long Term Goals - 05/19/18 2207      PT LONG TERM GOAL #1   Title  Patient demonstrates / verbalizes safe, independent prosthetic care to enable  prosthesis use without issues.  (All LTGs Target Date 08/13/2018)    Time  3    Period  Months    Status  New  Target Date  08/13/18      PT LONG TERM GOAL #2   Title  Patient tolerates wear of prostheses >75% of awake hours without skin issues & limb pain <4/10 to enable function throughout her day.     Time  3    Period  Months    Status  New    Target Date  08/13/18      PT LONG TERM GOAL #3   Title  Standing balance with rolling walker & bilateral prostheses reaching 5" anteriorly & laterally, scans environment with head & upper torso movements and manages clothes modified independent.     Time  3    Period  Months    Status  New    Target Date  08/13/18      PT LONG TERM GOAL #4   Title  Patient ambulates 24' with RW & bilateral prostheses around furniture modified independent to enable mobility in home.    Time  3    Period  Months    Status  New    Target Date  08/13/18      PT LONG TERM GOAL #5   Title  Patient ambulates 150' with RW & prostheses with grandson's assistance for community mobility.     Time  3    Period  Months    Status  New    Target Date  08/13/18      Additional Long Term Goals   Additional Long Term Goals  Yes      PT LONG TERM GOAL #6   Title  Patient negotiates ramps & curbs with RW & prostheses with grandson's assistance for community access.    Time  3    Period  Months    Status  New    Target Date  08/13/18            Plan - 07/29/18 1229    Clinical Impression Statement  Patient is on target to meet LTGs by end of certification period. PT introduced weighing & BMI with amputations and they appear to understand. PT also introduced floor tranfers in case of fall & balance HEP in corner.     Rehab Potential  Good    PT Frequency  2x / week    PT Duration  12 weeks    PT Treatment/Interventions  ADLs/Self Care Home Management;DME Instruction;Gait training;Stair training;Functional mobility training;Therapeutic  activities;Therapeutic exercise;Balance training;Neuromuscular re-education;Patient/family education;Prosthetic Training;Manual techniques;Vestibular    PT Next Visit Plan  review corner HEP, work on balance reactions, work towards [place] and Agree with Plan of Care  Patient;Family member/caregiver    Family Member Consulted  grandson, [name]       Patient will benefit from skilled therapeutic intervention in order to improve the following deficits and impairments:  Abnormal gait, Decreased activity tolerance, Decreased balance, Decreased endurance, Decreased knowledge of use of DME, Decreased mobility, Decreased range of motion, Decreased scar mobility, Decreased strength, Dizziness, Impaired flexibility, Postural dysfunction, Prosthetic Dependency  Visit Diagnosis: Muscle weakness (generalized)  Unsteadiness on feet  Other abnormalities of gait and mobility  Abnormal posture  Stiffness of right knee, not elsewhere classified  Stiffness of left knee, not elsewhere classified     Problem List Patient Active Problem List   Diagnosis Date Noted  . Ischemia of right lower extremity 06/19/2017  . Malnutrition of moderate degree 06/16/2017  . Ischemic foot 06/15/2017  .  Cellulitis 06/15/2017  . PAD (peripheral artery disease) ([place])   . Osteoporosis 04/29/2017  . Loss or death of child Jun 16, 2015  . Status post below-knee amputation of left lower extremity ([place]) 01/31/2015  . Essential hypertension 08/22/2014    Jamey Reas PT, DPT 07/29/2018, 12:33 PM  [facility] [address] [place], [name], [address] Phone: [phone]   Fax:  [phone]  Name: [name] MRN: [id number] Date of Birth: [birth year]

## 2018-07-29 NOTE — Patient Instructions (Signed)
Access Code: [email or web address]  URL: [email or web address]  Date: 07/29/2018  Prepared by: Jamey Reas   Exercises  Standing Balance with Eyes Open - 5 reps - 1 sets - 1x daily - 7x weekly  Standing with Head Rotation - 10 reps - 1 sets - 5 seconds hold - 1x daily - 7x weekly  Wide Stance with Head Nods and Unilateral Counter Support - 10 reps - 1 sets - 5 seconds hold - 1x daily - 7x weekly

## 2018-08-02 ENCOUNTER — Encounter: Payer: Self-pay | Admitting: Physical Therapy

## 2018-08-02 NOTE — Therapy (Signed)
[facility] [address] [place], [name], [address] Phone: [phone]   Fax:  [phone]  Patient Details  Name: [name] MRN: [id number] Date of Birth: 1931/07/02 Referring Provider:  Lamar Blinks  Encounter Date: 08/02/2018  [name], her grandson/caregiver  was contacted today regarding the temporary closing of [facility] Re[facility] Services due to [employer].  Therapist discussed:  Ongoing HEP, e-visits/telehealth & doing visit as recertification to assess if discharge or additional PT is indicated as her POC ends 08/13/2018.  Patient is interested in further information for an e-visit, virtual check in, or telehealth visit, if those services become available.    [facility] Services will follow up with patients when we are able to resume care.  Jamey Reas, PT, [facility] [address] [place], [place]  [address] Phone:  [phone] Fax:  [phone] Jamey Reas PT, DPT 08/02/2018, 11:05 AM  [facility] [address] [place], [name], [address] Phone: [phone]   Fax:  [phone]

## 2018-08-03 ENCOUNTER — Ambulatory Visit: Payer: Medicare Other | Admitting: Physical Therapy

## 2018-08-05 ENCOUNTER — Ambulatory Visit: Payer: Medicare Other | Admitting: Physical Therapy

## 2018-08-10 ENCOUNTER — Encounter: Payer: Medicare Other | Admitting: Physical Therapy

## 2018-08-12 ENCOUNTER — Encounter: Payer: Medicare Other | Admitting: Physical Therapy

## 2018-08-16 ENCOUNTER — Telehealth: Payer: Self-pay | Admitting: Physical Therapy

## 2018-08-16 NOTE — Telephone Encounter (Signed)
[name] was contacted today regarding temporary reduction of Outpatient [facility] Services due to concerns for community transmission of COVID-19, with PT speaking with son, [name]. Patient identity was verified.  Assessed if patient needed to be seen in person by clinician.  Patient did not have an acute/special need that requires in person visit. Proceeded with phone call.  Therapist advised the patient to continue to perform his/her HEP and assured he/she had no unanswered questions or concerns at this time.  The patient expressed interest in being contacted for an E-Visit, virtual check in, or Telehealth visit to continue their plan of care, when those services become available.  [facility] will follow up with patient at that time.   Patient is aware we can be reached by telephone during limited business hours in the meantime.   Mady Haagensen, PT 08/16/18 2:33 PM Phone: [phone] Fax: [phone]

## 2018-10-12 ENCOUNTER — Ambulatory Visit: Payer: Medicare Other | Admitting: Physical Therapy

## 2018-10-14 ENCOUNTER — Ambulatory Visit: Payer: Medicare Other | Admitting: Family Medicine

## 2018-12-04 ENCOUNTER — Other Ambulatory Visit: Payer: Self-pay | Admitting: Family Medicine

## 2018-12-04 DIAGNOSIS — I10 Essential (primary) hypertension: Secondary | ICD-10-CM

## 2019-02-18 DIAGNOSIS — Z23 Encounter for immunization: Secondary | ICD-10-CM | POA: Diagnosis not present

## 2019-02-21 ENCOUNTER — Encounter: Payer: Self-pay | Admitting: Physical Therapy

## 2019-02-21 NOTE — Therapy (Signed)
[facility] [address] [place], [name], [address] Phone: [phone]   Fax:  [phone]  Patient Details  Name: [name] MRN: [id number] Date of Birth: 06-27-1931 Referring Provider:  Lamar Blinks, MD  Encounter Date: 02/21/2019  PHYSICAL THERAPY DISCHARGE SUMMARY  Visits from Start of Care: 17  Current functional level related to goals / functional outcomes: See last PT note on 07/29/2018   Remaining deficits: [facility] initially closed due to [place] pandemic and reopened with new protocols. Patient did not feel comfortable returning to clinic and has not called to reschedule further appointments.   Education / Equipment: Prosthetic care  Plan: Patient agrees to discharge.  Patient goals were not met. Patient is being discharged due to not returning since the last visit.  ?????         Lavoy Bernards PT, DPT 02/21/2019, 12:21 PM  [facility] [address] [place], [name], [address] Phone: [phone]   Fax:  [phone]

## 2019-06-11 ENCOUNTER — Other Ambulatory Visit: Payer: Self-pay | Admitting: Family Medicine

## 2019-06-11 DIAGNOSIS — I10 Essential (primary) hypertension: Secondary | ICD-10-CM

## 2019-06-17 ENCOUNTER — Other Ambulatory Visit: Payer: Self-pay | Admitting: Family Medicine

## 2019-06-17 DIAGNOSIS — I739 Peripheral vascular disease, unspecified: Secondary | ICD-10-CM

## 2019-07-08 ENCOUNTER — Telehealth: Payer: Self-pay

## 2019-07-08 NOTE — Telephone Encounter (Signed)
Pt's son [name] called c/o pt's stump having a "fluid filled blister". He tried to call [facility] who told him to contact our office. Appt made for pt to see PA on 3/1.

## 2019-07-09 ENCOUNTER — Other Ambulatory Visit: Payer: Self-pay | Admitting: Family Medicine

## 2019-07-09 DIAGNOSIS — I10 Essential (primary) hypertension: Secondary | ICD-10-CM

## 2019-07-10 NOTE — Progress Notes (Deleted)
HISTORY AND PHYSICAL     CC:  follow up Requesting Provider:  [name], MD  HPI: [name] is a 86 y.o. (1932/02/25) female who has hx of right BKA on 06/17/2017 by Dr. Oneida Alar.  She was seen back on 07/23/2017 and at that time, she was doing well.  Her staples were removed and to f/u as needed.    Pt presents today for ***  The pt is not on a statin for cholesterol management.  The pt is not on a daily aspirin.   Other AC:  Plavix The pt is on ACEI for hypertension.   The pt is not diabetic.   Tobacco hx:  never  Past Medical History:  Diagnosis Date  . Arthritis   . 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 Hypertension   . Osteoporosis 04/15/2017  . PAD (peripheral artery disease) ([place])     Past Surgical History:  Procedure Laterality Date  . ABDOMINAL HYSTERECTOMY     partial  . AMPUTATION Left 01/29/2015   Procedure: Left  AMPUTATION BELOW KNEE;  Surgeon: Elam Dutch, MD;  Location: [facility];  Service: Vascular;  Laterality: Left;  . AMPUTATION Right 06/17/2017   Procedure: Right  BELOW KNEE Amputation;  Surgeon: Elam Dutch, MD;  Location: [facility];  Service: Vascular;  Laterality: Right;  . KNEE SURGERY Right 1984  . PERIPHERAL VASCULAR CATHETERIZATION N/A 09/15/2014   Procedure: Abdominal Aortogram;  Surgeon: Elam Dutch, MD;  Location: [facility] INVASIVE CV LAB CUPID;  Service: Cardiovascular;  Laterality: N/A;  . PERIPHERAL VASCULAR CATHETERIZATION Bilateral 09/15/2014   Procedure: Lower Extremity Angiography;  Surgeon: Elam Dutch, MD;  Location: [facility] INVASIVE CV LAB CUPID;  Service: Cardiovascular;  Laterality: Bilateral;  . SPINE SURGERY      Social History   Socioeconomic History  . Marital status: Widowed    Spouse name: Not on file  . Number of children: Not on file  . Years of education: Not on file  . Highest education level: Not on file  Occupational History  . Not on file  Tobacco Use  . Smoking status: Never Smoker  . Smokeless tobacco:  Never Used  Substance and Sexual Activity  . Alcohol use: No    Alcohol/week: 0.0 standard drinks  . Drug use: No  . Sexual activity: Not on file  Other Topics Concern  . Not on file  Social History Narrative  . Not on file   Social Determinants of Health   Financial Resource Strain:   . Difficulty of Paying Living Expenses: Not on file  Food Insecurity:   . Worried About Charity fundraiser in the Last Year: Not on file  . Ran Out of Food in the Last Year: Not on file  Transportation Needs:   . Lack of Transportation (Medical): Not on file  . Lack of Transportation (Non-Medical): Not on file  Physical Activity:   . Days of Exercise per Week: Not on file  . Minutes of Exercise per Session: Not on file  Stress:   . Feeling of Stress : Not on file  Social Connections:   . Frequency of Communication with Friends and Family: Not on file  . Frequency of Social Gatherings with Friends and Family: Not on file  . Attends Religious Services: Not on file  . Active Member of Clubs or Organizations: Not on file  . Attends Archivist Meetings: Not on file  . Marital Status: Not on file  Intimate Partner Violence:   . Fear of Current or Ex-Partner: Not on file  . Emotionally Abused: Not on file  . Physically Abused: Not on file  . Sexually Abused: Not on file   *** Family History  Problem Relation Age of Onset  . Diabetes Mother   . Heart disease Mother   . Dementia Sister   . Diabetes Son     Current Outpatient Medications  Medication Sig Dispense Refill  . acetaminophen (TYLENOL) 325 MG tablet Take 1-2 tablets (325-650 mg total) by mouth every 4 (four) hours as needed for mild pain (or temp >/= 101 F).    Marland Kitchen amoxicillin (AMOXIL) 500 MG capsule Take 2 capsules (1,000 mg total) by mouth 2 (two) times daily. 40 capsule 0  . clopidogrel (PLAVIX) 75 MG tablet TAKE 1 TABLET BY MOUTH EVERY DAY 30 tablet 0  . lisinopril (ZESTRIL) 20 MG tablet TAKE 1 TABLET BY MOUTH EVERY DAY  30 tablet 1  . omeprazole (PRILOSEC) 20 MG capsule Take 20 mg by mouth daily.    Vladimir Faster Glycol-Propyl Glycol (SYSTANE FREE OP) Place 1 drop into both eyes daily. For dry eyes     No current facility-administered medications for this visit.    No Known Allergies   REVIEW OF SYSTEMS:  *** [X]  denotes positive finding, [ ]  denotes negative finding Cardiac  Comments:  Chest pain or chest pressure:    Shortness of breath upon exertion:    Short of breath when lying flat:    Irregular heart rhythm:        Vascular    Pain in calf, thigh, or hip brought on by ambulation:    Pain in feet at night that wakes you up from your sleep:     Blood clot in your veins:    Leg swelling:         Pulmonary    Oxygen at home:    Productive cough:     Wheezing:         Neurologic    Sudden weakness in arms or legs:     Sudden numbness in arms or legs:     Sudden onset of difficulty speaking or slurred speech:    Temporary loss of vision in one eye:     Problems with dizziness:         Gastrointestinal    Blood in stool:     Vomited blood:         Genitourinary    Burning when urinating:     Blood in urine:        Psychiatric    Major depression:         Hematologic    Bleeding problems:    Problems with blood clotting too easily:        Skin    Rashes or ulcers:        Constitutional    Fever or chills:      PHYSICAL EXAMINATION:  ***  General:  WDWN in NAD; vital signs documented above Gait: Not observed HENT: WNL, normocephalic Pulmonary: normal non-labored breathing , without Rales, rhonchi,  wheezing Cardiac: {Desc; regular/irreg:14544} HR, without  Murmurs {With/Without:20273} carotid bruit*** Abdomen: soft, NT, no masses Skin: {With/Without:20273} rashes Vascular Exam/Pulses:  Right Left  Radial {Exam; arterial pulse strength 0-4:30167} {Exam; arterial pulse strength 0-4:30167}  Ulnar {Exam; arterial pulse strength 0-4:30167} {Exam; arterial pulse  strength 0-4:30167}  Femoral {Exam; arterial pulse strength 0-4:30167} {Exam; arterial pulse strength 0-4:30167}  Popliteal {  Exam; arterial pulse strength 0-4:30167} {Exam; arterial pulse strength 0-4:30167}  DP {Exam; arterial pulse strength 0-4:30167} {Exam; arterial pulse strength 0-4:30167}  PT {Exam; arterial pulse strength 0-4:30167} {Exam; arterial pulse strength 0-4:30167}   Extremities: {With/Without:20273} ischemic changes, {With/Without:20273} Gangrene , {With/Without:20273} cellulitis; {With/Without:20273} open wounds;  Musculoskeletal: no muscle wasting or atrophy  Neurologic: A&O X 3;  No focal weakness or paresthesias are detected Psychiatric:  The pt has {Desc; normal/abnormal:11317::"Normal"} affect.   Non-Invasive Vascular Imaging:   ***    ASSESSMENT/PLAN:: 86 y.o. female with hx of right BKA by Dr. Oneida Alar in 2019 who returns today for ***   -***   Leontine Locket, PA-C [facility] [phone]  Clinic MD:   Trula Slade

## 2019-07-11 ENCOUNTER — Ambulatory Visit: Payer: Medicare Other

## 2019-07-13 ENCOUNTER — Other Ambulatory Visit: Payer: Self-pay | Admitting: Family Medicine

## 2019-07-13 DIAGNOSIS — I739 Peripheral vascular disease, unspecified: Secondary | ICD-10-CM

## 2019-07-13 NOTE — Progress Notes (Signed)
[facility] at [facility] [address] [place], [name] [address] [phone]  Date:  07/18/2019   Name:  [name]   DOB:  02-04-1932   MRN:  [id number]  PCP:  [name], MD    Chief Complaint: No chief complaint on file.   History of Present Illness:  [name] is a 86 y.o. very pleasant female patient who presents with the following:  Virtual visit today for follow-up I last saw [name] in December 2019-she has not followed up since due to the pandemic  History of hypertension, PAD status post bilateral lower extremity amputation, osteoporosis Right BKA February 2019, left 2016  I called today and connected with her grandson [name], he had thought her appointment was later this week and they are not ready to do the appointment just now.  I rescheduled her for this coming Thursday, they do need refills for her medications today which I will take care of   Patient Active Problem List   Diagnosis Date Noted  . Ischemia of right lower extremity 06/19/2017  . Malnutrition of moderate degree 06/16/2017  . Ischemic foot 06/15/2017  . Cellulitis 06/15/2017  . PAD (peripheral artery disease) ([place])   . Osteoporosis May 05, 2017  . Loss or death of child Jun 22, 2015  . Status post below-knee amputation of left lower extremity ([place]) 01/31/2015  . Essential hypertension 08/22/2014    Past Medical History:  Diagnosis Date  . Arthritis   . 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 Hypertension   . Osteoporosis 05/05/17  . PAD (peripheral artery disease) ([place])     Past Surgical History:  Procedure Laterality Date  . ABDOMINAL HYSTERECTOMY     partial  . AMPUTATION Left 01/29/2015   Procedure: Left  AMPUTATION BELOW KNEE;  Surgeon: Elam Dutch, MD;  Location: [facility];  Service: Vascular;  Laterality: Left;  . AMPUTATION Right 06/17/2017   Procedure: Right  BELOW KNEE Amputation;  Surgeon: Elam Dutch, MD;   Location: [facility];  Service: Vascular;  Laterality: Right;  . KNEE SURGERY Right 1984  . PERIPHERAL VASCULAR CATHETERIZATION N/A 09/15/2014   Procedure: Abdominal Aortogram;  Surgeon: Elam Dutch, MD;  Location: [facility] INVASIVE CV LAB CUPID;  Service: Cardiovascular;  Laterality: N/A;  . PERIPHERAL VASCULAR CATHETERIZATION Bilateral 09/15/2014   Procedure: Lower Extremity Angiography;  Surgeon: Elam Dutch, MD;  Location: [facility] INVASIVE CV LAB CUPID;  Service: Cardiovascular;  Laterality: Bilateral;  . SPINE SURGERY      Social History   Tobacco Use  . Smoking status: Never Smoker  . Smokeless tobacco: Never Used  Substance Use Topics  . Alcohol use: No    Alcohol/week: 0.0 standard drinks  . Drug use: No    Family History  Problem Relation Age of Onset  . Diabetes Mother   . Heart disease Mother   . Dementia Sister   . Diabetes Son     No Known Allergies  Medication list has been reviewed and updated.  Current Outpatient Medications on File Prior to Visit  Medication Sig Dispense Refill  . acetaminophen (TYLENOL) 325 MG tablet Take 1-2 tablets (325-650 mg total) by mouth every 4 (four) hours as needed for mild pain (or temp >/= 101 F).    Marland Kitchen amoxicillin (AMOXIL) 500 MG capsule Take 2 capsules (1,000 mg total) by mouth 2 (two) times daily. 40 capsule 0  . clopidogrel (PLAVIX) 75 MG tablet TAKE  1 TABLET BY MOUTH EVERY DAY 30 tablet 0  . lisinopril (ZESTRIL) 20 MG tablet TAKE 1 TABLET BY MOUTH EVERY DAY 30 tablet 1  . omeprazole (PRILOSEC) 20 MG capsule Take 20 mg by mouth daily.    Vladimir Faster Glycol-Propyl Glycol (SYSTANE FREE OP) Place 1 drop into both eyes daily. For dry eyes     No current facility-administered medications on file prior to visit.    Review of Systems:  As per HPI- otherwise negative.   Physical Examination: There were no vitals filed for this visit. There were no vitals filed for this visit. There is no height or weight on file to calculate  BMI. Ideal Body Weight:      Assessment and Plan: Not seen today, rescheduled  Signed Lamar Blinks, MD

## 2019-07-18 ENCOUNTER — Other Ambulatory Visit: Payer: Self-pay

## 2019-07-18 ENCOUNTER — Encounter: Payer: Self-pay | Admitting: Family Medicine

## 2019-07-18 ENCOUNTER — Ambulatory Visit: Payer: Medicare Other | Admitting: Family Medicine

## 2019-07-18 DIAGNOSIS — I739 Peripheral vascular disease, unspecified: Secondary | ICD-10-CM

## 2019-07-18 DIAGNOSIS — I10 Essential (primary) hypertension: Secondary | ICD-10-CM

## 2019-07-18 MED ORDER — LISINOPRIL 20 MG PO TABS: 20.0000 mg | ORAL_TABLET | Freq: Every day | ORAL | 3 refills | Status: DC

## 2019-07-18 MED ORDER — CLOPIDOGREL BISULFATE 75 MG PO TABS: 75.0000 mg | ORAL_TABLET | Freq: Every day | ORAL | 3 refills | Status: AC

## 2019-07-21 ENCOUNTER — Ambulatory Visit (INDEPENDENT_AMBULATORY_CARE_PROVIDER_SITE_OTHER): Payer: Medicare Other | Admitting: Family Medicine

## 2019-07-21 ENCOUNTER — Encounter: Payer: Self-pay | Admitting: Family Medicine

## 2019-07-21 ENCOUNTER — Other Ambulatory Visit: Payer: Self-pay

## 2019-07-21 DIAGNOSIS — J3489 Other specified disorders of nose and nasal sinuses: Secondary | ICD-10-CM

## 2019-07-21 DIAGNOSIS — Z7409 Other reduced mobility: Secondary | ICD-10-CM

## 2019-07-21 DIAGNOSIS — I10 Essential (primary) hypertension: Secondary | ICD-10-CM | POA: Diagnosis not present

## 2019-07-21 DIAGNOSIS — Z89512 Acquired absence of left leg below knee: Secondary | ICD-10-CM

## 2019-07-21 DIAGNOSIS — I739 Peripheral vascular disease, unspecified: Secondary | ICD-10-CM | POA: Diagnosis not present

## 2019-07-21 MED ORDER — IPRATROPIUM BROMIDE 0.03 % NA SOLN: 2.0000 | Freq: Three times a day (TID) | NASAL | 12 refills | Status: AC

## 2019-07-21 NOTE — Progress Notes (Addendum)
[place] at [facility] [address] [place], [name] [address] [phone]  Date:  07/21/2019   Name:  [name]   DOB:  May 01, 1932   MRN:  [id number]  PCP:  [name], MD    Chief Complaint: No chief complaint on file.   History of Present Illness:  [name] is a 86 y.o. very pleasant female patient who presents with the following:  Virtual visit today for follow-up Patient location is home, provider location is office.  Patient identity confirmed with 2 factors, she gives consent for virtual visit today The patient and myself are present on the call  [name] has history of hypertension, peripheral artery disease status post BKA of both legs, osteoporosis Left leg amputation 2016, right 2019  Last seen by myself in December 2019 She was doing some outpatient rehab last year, but stopped when the pandemic became apparent She notes some nasal mucus- clear.  This has been present for a few weeks It is worse when she lays down at night  I offered a trial of Atrovent nasal and she would like to use this  Most recent labs in May 2019 DEXA scan needs to be updated- pt declines at this time  Offer mammogram- she declines at this time   Plavix Lisinopril  She has a blister on her left stump-[facility] has been managing this, and notes that it has improved quite a bit over the last 2 weeks.  However, they are not sure if her prosthesis may need some adjustment.  Right now she is generally avoiding using her prosthesis, but this makes transferring difficult Her prosthetics are through the [facility] [name] or [name] manages her  [phone] ph [phone] She needs orders for new equipment  I called and spoke with [name] to get details about what her order should say: She is a K2 functional level Requirements are balance safe ambulation  New prosthetic replacement and socket supplies  Patient  Active Problem List   Diagnosis Date Noted  . Ischemia of right lower extremity 06/19/2017  . Malnutrition of moderate degree 06/16/2017  . Ischemic foot 06/15/2017  . Cellulitis 06/15/2017  . PAD (peripheral artery disease) ([place])   . Osteoporosis 05/06/17  . Loss or death of child 23-Jun-2015  . Status post below-knee amputation of left lower extremity ([place]) 01/31/2015  . Essential hypertension 08/22/2014    Past Medical History:  Diagnosis Date  . Arthritis   . Cancer ([place])    utertrine  . Hypertension   . Osteoporosis 05/06/2017  . PAD (peripheral artery disease) ([place])     Past Surgical History:  Procedure Laterality Date  . ABDOMINAL HYSTERECTOMY     partial  . AMPUTATION Left 01/29/2015   Procedure: Left  AMPUTATION BELOW KNEE;  Surgeon: Elam Dutch, MD;  Location: [facility];  Service: Vascular;  Laterality: Left;  . AMPUTATION Right 06/17/2017   Procedure: Right  BELOW KNEE Amputation;  Surgeon: Elam Dutch, MD;  Location: [facility];  Service: Vascular;  Laterality: Right;  . KNEE SURGERY Right 1984  . PERIPHERAL VASCULAR CATHETERIZATION N/A 09/15/2014   Procedure: Abdominal Aortogram;  Surgeon: Elam Dutch, MD;  Location: [facility] INVASIVE CV LAB CUPID;  Service: Cardiovascular;  Laterality: N/A;  . PERIPHERAL VASCULAR CATHETERIZATION Bilateral 09/15/2014   Procedure: Lower Extremity Angiography;  Surgeon: Elam Dutch, MD;  Location: [facility] INVASIVE CV LAB CUPID;  Service: Cardiovascular;  Laterality: Bilateral;  . SPINE SURGERY      Social History   Tobacco Use  . Smoking status: Never Smoker  . Smokeless tobacco: Never Used  Substance Use Topics  . Alcohol use: No    Alcohol/week: 0.0 standard drinks  . Drug use: No    Family History  Problem Relation Age of Onset  . Diabetes Mother   . Heart disease Mother   . Dementia Sister   . Diabetes Son     No Known Allergies  Medication list has been reviewed and updated.  Current Outpatient Medications on  File Prior to Visit  Medication Sig Dispense Refill  . acetaminophen (TYLENOL) 325 MG tablet Take 1-2 tablets (325-650 mg total) by mouth every 4 (four) hours as needed for mild pain (or temp >/= 101 F).    . clopidogrel (PLAVIX) 75 MG tablet Take 1 tablet (75 mg total) by mouth daily. 90 tablet 3  . lisinopril (ZESTRIL) 20 MG tablet Take 1 tablet (20 mg total) by mouth daily. 90 tablet 3  . omeprazole (PRILOSEC) 20 MG capsule Take 20 mg by mouth daily.    Vladimir Faster Glycol-Propyl Glycol (SYSTANE FREE OP) Place 1 drop into both eyes daily. For dry eyes     No current facility-administered medications on file prior to visit.    Review of Systems:  As per HPI- otherwise negative.   Physical Examination: There were no vitals filed for this visit. There were no vitals filed for this visit. There is no height or weight on file to calculate BMI. Ideal Body Weight:    Spoke to pt and [name] over the telephone today She sounds well, her normal self No cough, wheezing, or distress is noted They are checking her blood pressure at home, note that it is "normal" other than do not have any specific numbers can be Assessment and Plan: PVD (peripheral vascular disease) ([place])  Essential hypertension - Plan: CBC, Comprehensive metabolic panel  Mobility impaired  Nasal discharge - Plan: ipratropium (ATROVENT) 0.03 % nasal spray  Status post below-knee amputation of left lower extremity ([place])  Patient with history of peripheral vascular disease status post bilateral amputation, hypertension. We will have her try Atrovent nasal spray for chronic nasal discharge Continue her Plavix and lisinopril Encouraged her to be seen at some point in the near future for labs-I have placed orders for her, can be done as a lab visit only I wrote a letter to [facility] regarding her prosthetic supplies and faxed today We spoke for 12 minutes today  Signed Lamar Blinks, MD  addnd 5/11- pt is  requesting a wheelchair.  A cane or walker will not suffice for her as she is a bilateral lower extremity amputee. A wheelchair is necessary for her -- JC

## 2019-08-22 ENCOUNTER — Encounter: Payer: Self-pay | Admitting: Family Medicine

## 2019-08-22 NOTE — Telephone Encounter (Signed)
Called and spoke with pt and with [name]- she has abd distention, constipation and has vomited- need to go to ER for further eval.  They agree

## 2019-08-23 ENCOUNTER — Other Ambulatory Visit: Payer: Self-pay

## 2019-08-23 ENCOUNTER — Inpatient Hospital Stay (HOSPITAL_COMMUNITY)
Admission: EM | Admit: 2019-08-23 | Discharge: 2019-09-05 | DRG: 330 | Disposition: A | Discharge status: Skilled Nursing Facility | Payer: Medicare Other | Attending: Internal Medicine | Admitting: Internal Medicine

## 2019-08-23 ENCOUNTER — Encounter (HOSPITAL_COMMUNITY): Payer: Self-pay | Admitting: Emergency Medicine

## 2019-08-23 ENCOUNTER — Emergency Department (HOSPITAL_COMMUNITY): Payer: Medicare Other

## 2019-08-23 ENCOUNTER — Encounter: Payer: Self-pay | Admitting: Family Medicine

## 2019-08-23 DIAGNOSIS — R059 Cough, unspecified: Secondary | ICD-10-CM

## 2019-08-23 DIAGNOSIS — K56609 Unspecified intestinal obstruction, unspecified as to partial versus complete obstruction: Secondary | ICD-10-CM | POA: Diagnosis present

## 2019-08-23 DIAGNOSIS — C779 Secondary and unspecified malignant neoplasm of lymph node, unspecified: Secondary | ICD-10-CM | POA: Diagnosis present

## 2019-08-23 DIAGNOSIS — C189 Malignant neoplasm of colon, unspecified: Secondary | ICD-10-CM | POA: Diagnosis not present

## 2019-08-23 DIAGNOSIS — Z66 Do not resuscitate: Secondary | ICD-10-CM | POA: Diagnosis present

## 2019-08-23 DIAGNOSIS — B370 Candidal stomatitis: Secondary | ICD-10-CM | POA: Diagnosis not present

## 2019-08-23 DIAGNOSIS — I471 Supraventricular tachycardia: Secondary | ICD-10-CM | POA: Diagnosis not present

## 2019-08-23 DIAGNOSIS — I351 Nonrheumatic aortic (valve) insufficiency: Secondary | ICD-10-CM | POA: Diagnosis not present

## 2019-08-23 DIAGNOSIS — E46 Unspecified protein-calorie malnutrition: Secondary | ICD-10-CM | POA: Diagnosis present

## 2019-08-23 DIAGNOSIS — Z89511 Acquired absence of right leg below knee: Secondary | ICD-10-CM

## 2019-08-23 DIAGNOSIS — N19 Unspecified kidney failure: Secondary | ICD-10-CM | POA: Diagnosis present

## 2019-08-23 DIAGNOSIS — D72829 Elevated white blood cell count, unspecified: Secondary | ICD-10-CM

## 2019-08-23 DIAGNOSIS — Z90711 Acquired absence of uterus with remaining cervical stump: Secondary | ICD-10-CM

## 2019-08-23 DIAGNOSIS — K219 Gastro-esophageal reflux disease without esophagitis: Secondary | ICD-10-CM | POA: Diagnosis present

## 2019-08-23 DIAGNOSIS — Z681 Body mass index (BMI) 19 or less, adult: Secondary | ICD-10-CM

## 2019-08-23 DIAGNOSIS — N390 Urinary tract infection, site not specified: Secondary | ICD-10-CM | POA: Diagnosis not present

## 2019-08-23 DIAGNOSIS — S0990XA Unspecified injury of head, initial encounter: Secondary | ICD-10-CM | POA: Diagnosis not present

## 2019-08-23 DIAGNOSIS — Z7902 Long term (current) use of antithrombotics/antiplatelets: Secondary | ICD-10-CM | POA: Diagnosis not present

## 2019-08-23 DIAGNOSIS — Z8249 Family history of ischemic heart disease and other diseases of the circulatory system: Secondary | ICD-10-CM

## 2019-08-23 DIAGNOSIS — R531 Weakness: Secondary | ICD-10-CM | POA: Diagnosis not present

## 2019-08-23 DIAGNOSIS — K6389 Other specified diseases of intestine: Secondary | ICD-10-CM

## 2019-08-23 DIAGNOSIS — C187 Malignant neoplasm of sigmoid colon: Secondary | ICD-10-CM | POA: Diagnosis not present

## 2019-08-23 DIAGNOSIS — C787 Secondary malignant neoplasm of liver and intrahepatic bile duct: Secondary | ICD-10-CM | POA: Diagnosis present

## 2019-08-23 DIAGNOSIS — Z8542 Personal history of malignant neoplasm of other parts of uterus: Secondary | ICD-10-CM

## 2019-08-23 DIAGNOSIS — R05 Cough: Secondary | ICD-10-CM

## 2019-08-23 DIAGNOSIS — I739 Peripheral vascular disease, unspecified: Secondary | ICD-10-CM | POA: Diagnosis present

## 2019-08-23 DIAGNOSIS — R933 Abnormal findings on diagnostic imaging of other parts of digestive tract: Secondary | ICD-10-CM | POA: Diagnosis not present

## 2019-08-23 DIAGNOSIS — Z03818 Encounter for observation for suspected exposure to other biological agents ruled out: Secondary | ICD-10-CM | POA: Diagnosis not present

## 2019-08-23 DIAGNOSIS — D630 Anemia in neoplastic disease: Secondary | ICD-10-CM | POA: Diagnosis present

## 2019-08-23 DIAGNOSIS — K5669 Other partial intestinal obstruction: Secondary | ICD-10-CM | POA: Diagnosis present

## 2019-08-23 DIAGNOSIS — L0390 Cellulitis, unspecified: Secondary | ICD-10-CM | POA: Diagnosis not present

## 2019-08-23 DIAGNOSIS — Z89512 Acquired absence of left leg below knee: Secondary | ICD-10-CM | POA: Diagnosis not present

## 2019-08-23 DIAGNOSIS — J9811 Atelectasis: Secondary | ICD-10-CM | POA: Diagnosis present

## 2019-08-23 DIAGNOSIS — Z01810 Encounter for preprocedural cardiovascular examination: Secondary | ICD-10-CM | POA: Diagnosis not present

## 2019-08-23 DIAGNOSIS — E860 Dehydration: Secondary | ICD-10-CM | POA: Diagnosis present

## 2019-08-23 DIAGNOSIS — Z833 Family history of diabetes mellitus: Secondary | ICD-10-CM

## 2019-08-23 DIAGNOSIS — M810 Age-related osteoporosis without current pathological fracture: Secondary | ICD-10-CM | POA: Diagnosis present

## 2019-08-23 DIAGNOSIS — M1990 Unspecified osteoarthritis, unspecified site: Secondary | ICD-10-CM | POA: Diagnosis present

## 2019-08-23 DIAGNOSIS — R569 Unspecified convulsions: Secondary | ICD-10-CM | POA: Diagnosis not present

## 2019-08-23 DIAGNOSIS — Z20822 Contact with and (suspected) exposure to covid-19: Secondary | ICD-10-CM | POA: Diagnosis present

## 2019-08-23 DIAGNOSIS — I10 Essential (primary) hypertension: Secondary | ICD-10-CM | POA: Diagnosis present

## 2019-08-23 DIAGNOSIS — R1084 Generalized abdominal pain: Secondary | ICD-10-CM | POA: Diagnosis not present

## 2019-08-23 DIAGNOSIS — R109 Unspecified abdominal pain: Secondary | ICD-10-CM | POA: Diagnosis present

## 2019-08-23 DIAGNOSIS — R7989 Other specified abnormal findings of blood chemistry: Secondary | ICD-10-CM

## 2019-08-23 DIAGNOSIS — Z4659 Encounter for fitting and adjustment of other gastrointestinal appliance and device: Secondary | ICD-10-CM | POA: Diagnosis not present

## 2019-08-23 DIAGNOSIS — R140 Abdominal distension (gaseous): Secondary | ICD-10-CM | POA: Diagnosis not present

## 2019-08-23 DIAGNOSIS — K5900 Constipation, unspecified: Secondary | ICD-10-CM | POA: Diagnosis not present

## 2019-08-23 DIAGNOSIS — Z743 Need for continuous supervision: Secondary | ICD-10-CM | POA: Diagnosis not present

## 2019-08-23 DIAGNOSIS — B9620 Unspecified Escherichia coli [E. coli] as the cause of diseases classified elsewhere: Secondary | ICD-10-CM | POA: Diagnosis not present

## 2019-08-23 DIAGNOSIS — R1110 Vomiting, unspecified: Secondary | ICD-10-CM | POA: Diagnosis not present

## 2019-08-23 LAB — URINALYSIS, ROUTINE W REFLEX MICROSCOPIC
Bilirubin Urine: NEGATIVE
Glucose, UA: NEGATIVE mg/dL
Hgb urine dipstick: NEGATIVE
Ketones, ur: 5 mg/dL — AB
Nitrite: NEGATIVE
Protein, ur: NEGATIVE mg/dL
Specific Gravity, Urine: 1.023 (ref 1.005–1.030)
pH: 5 (ref 5.0–8.0)

## 2019-08-23 LAB — CBC WITH DIFFERENTIAL/PLATELET
Abs Immature Granulocytes: 0.03 10*3/uL (ref 0.00–0.07)
Basophils Absolute: 0 10*3/uL (ref 0.0–0.1)
Basophils Relative: 0 %
Eosinophils Absolute: 0 10*3/uL (ref 0.0–0.5)
Eosinophils Relative: 0 %
HCT: 36.5 % (ref 36.0–46.0)
Hemoglobin: 11.9 g/dL — ABNORMAL LOW (ref 12.0–15.0)
Immature Granulocytes: 0 %
Lymphocytes Relative: 15 %
Lymphs Abs: 1.3 10*3/uL (ref 0.7–4.0)
MCH: 29.9 pg (ref 26.0–34.0)
MCHC: 32.6 g/dL (ref 30.0–36.0)
MCV: 91.7 fL (ref 80.0–100.0)
Monocytes Absolute: 1.2 10*3/uL — ABNORMAL HIGH (ref 0.1–1.0)
Monocytes Relative: 14 %
Neutro Abs: 5.8 10*3/uL (ref 1.7–7.7)
Neutrophils Relative %: 71 %
Platelets: 368 10*3/uL (ref 150–400)
RBC: 3.98 MIL/uL (ref 3.87–5.11)
RDW: 14.4 % (ref 11.5–15.5)
WBC: 8.2 10*3/uL (ref 4.0–10.5)
nRBC: 0 % (ref 0.0–0.2)

## 2019-08-23 LAB — COMPREHENSIVE METABOLIC PANEL
ALT: 15 U/L (ref 0–44)
AST: 21 U/L (ref 15–41)
Albumin: 3.3 g/dL — ABNORMAL LOW (ref 3.5–5.0)
Alkaline Phosphatase: 121 U/L (ref 38–126)
Anion gap: 10 (ref 5–15)
BUN: 26 mg/dL — ABNORMAL HIGH (ref 8–23)
CO2: 25 mmol/L (ref 22–32)
Calcium: 9 mg/dL (ref 8.9–10.3)
Chloride: 101 mmol/L (ref 98–111)
Creatinine, Ser: 0.45 mg/dL (ref 0.44–1.00)
GFR calc Af Amer: >60 mL/min (ref >60)
GFR calc non Af Amer: >60 mL/min (ref >60)
Glucose, Bld: 117 mg/dL — ABNORMAL HIGH (ref 70–99)
Potassium: 3.9 mmol/L (ref 3.5–5.1)
Sodium: 136 mmol/L (ref 135–145)
Total Bilirubin: 0.5 mg/dL (ref 0.3–1.2)
Total Protein: 6.5 g/dL (ref 6.5–8.1)

## 2019-08-23 LAB — RESPIRATORY PANEL BY RT PCR (FLU A&B, COVID)
Influenza A by PCR: NEGATIVE
Influenza B by PCR: NEGATIVE
SARS Coronavirus 2 by RT PCR: NEGATIVE

## 2019-08-23 LAB — LIPASE, BLOOD: Lipase: 18 U/L (ref 11–51)

## 2019-08-23 MED ORDER — MORPHINE SULFATE (PF) 2 MG/ML IV SOLN
2.0000 mg | INTRAVENOUS | Status: DC | PRN
  Administered 2019-08-27: 2 mg via INTRAVENOUS
  Filled 2019-08-23: qty 1

## 2019-08-23 MED ORDER — LACTATED RINGERS IV SOLN: INTRAVENOUS | Status: DC

## 2019-08-23 MED ORDER — SODIUM CHLORIDE 0.9% FLUSH
3.0000 mL | Freq: Two times a day (BID) | INTRAVENOUS | Status: DC
  Administered 2019-08-24 – 2019-09-04 (×10): 3 mL via INTRAVENOUS

## 2019-08-23 MED ORDER — ONDANSETRON HCL 4 MG/2ML IJ SOLN
4.0000 mg | Freq: Four times a day (QID) | INTRAMUSCULAR | Status: DC | PRN
  Administered 2019-08-27 – 2019-08-29 (×2): 4 mg via INTRAVENOUS
  Filled 2019-08-23 (×2): qty 2

## 2019-08-23 MED ORDER — IOHEXOL 300 MG/ML  SOLN
100.0000 mL | Freq: Once | INTRAMUSCULAR | Status: AC | PRN
  Administered 2019-08-23: 100 mL via INTRAVENOUS

## 2019-08-23 MED ORDER — SODIUM CHLORIDE (PF) 0.9 % IJ SOLN
INTRAMUSCULAR | Status: AC
  Filled 2019-08-23: qty 50

## 2019-08-23 NOTE — ED Triage Notes (Signed)
Patient presents from home with complaints of constipation x 5 days. She has tried multiple time to relieve her self with the use of miralax and other over the counter aids. Nothing has helped.    EMS vitals: 122/76 BP 110 HR 18 RR 95% O2 sat on room air

## 2019-08-23 NOTE — ED Provider Notes (Signed)
[facility] DEPT Provider Note   CSN: [id number] Arrival date & time: 08/23/19  1218     History Chief Complaint  Patient presents with  . Constipation    [name] is a 86 y.o. female.  86 year old female with prior medical history as detailed below presents for evaluation of abdominal distention and constipation.  Patient is reporting a longstanding history of constipation.  She reports increased abdominal bloating over the last 5 days.  She reports that she has had no significant bowel movement for the last 3 days.  She is still passing gas.  She does report intermittent vomiting.  She denies abdominal pain.  She denies fever.  She reportedly tried MiraLAX at home without significant improvement or with passage of stool.  She has not tried any enemas.  The history is provided by the patient and medical records.  Constipation Severity:  Moderate Time since last bowel movement:  3 days Timing:  Constant Progression:  Worsening Chronicity:  Recurrent Stool description:  None produced Relieved by:  Nothing Worsened by:  Nothing      Past Medical History:  Diagnosis Date  . Arthritis   . Cancer ([place])    utertrine  . Hypertension   . Osteoporosis 04-22-2017  . PAD (peripheral artery disease) [facility])     Patient Active Problem List   Diagnosis Date Noted  . Ischemia of right lower extremity 06/19/2017  . Malnutrition of moderate degree 06/16/2017  . Ischemic foot 06/15/2017  . Cellulitis 06/15/2017  . PAD (peripheral artery disease) ([place])   . Osteoporosis 04/22/2017  . Loss or death of child 09-Jun-2015  . Status post below-knee amputation of left lower extremity ([place]) 01/31/2015  . Essential hypertension 08/22/2014    Past Surgical History:  Procedure Laterality Date  . ABDOMINAL HYSTERECTOMY     partial  . AMPUTATION Left 01/29/2015   Procedure: Left  AMPUTATION BELOW KNEE;  Surgeon: Elam Dutch, MD;  Location: [facility];   Service: Vascular;  Laterality: Left;  . AMPUTATION Right 06/17/2017   Procedure: Right  BELOW KNEE Amputation;  Surgeon: Elam Dutch, MD;  Location: [facility];  Service: Vascular;  Laterality: Right;  . KNEE SURGERY Right 1984  . PERIPHERAL VASCULAR CATHETERIZATION N/A 09/15/2014   Procedure: Abdominal Aortogram;  Surgeon: Elam Dutch, MD;  Location: [facility] INVASIVE CV LAB CUPID;  Service: Cardiovascular;  Laterality: N/A;  . PERIPHERAL VASCULAR CATHETERIZATION Bilateral 09/15/2014   Procedure: Lower Extremity Angiography;  Surgeon: Elam Dutch, MD;  Location: [facility] INVASIVE CV LAB CUPID;  Service: Cardiovascular;  Laterality: Bilateral;  . SPINE SURGERY       OB History   No obstetric history on file.     Family History  Problem Relation Age of Onset  . Diabetes Mother   . Heart disease Mother   . Dementia Sister   . Diabetes Son     Social History   Tobacco Use  . Smoking status: Never Smoker  . Smokeless tobacco: Never Used  Substance Use Topics  . Alcohol use: No    Alcohol/week: 0.0 standard drinks  . Drug use: No    Home Medications Prior to Admission medications   Medication Sig Start Date End Date Taking? Authorizing Provider  acetaminophen (TYLENOL) 325 MG tablet Take 1-2 tablets (325-650 mg total) by mouth every 4 (four) hours as needed for mild pain (or temp >/= 101 F). 06/19/17   Debbe Odea, MD  clopidogrel (PLAVIX) 75 MG tablet Take  1 tablet (75 mg total) by mouth daily. 07/18/19   Copland, Gay Filler, MD  ipratropium (ATROVENT) 0.03 % nasal spray Place 2 sprays into both nostrils 3 (three) times daily. Use as needed for allergies and nasal discharge 07/21/19   Copland, Gay Filler, MD  lisinopril (ZESTRIL) 20 MG tablet Take 1 tablet (20 mg total) by mouth daily. 07/18/19   Copland, Gay Filler, MD  omeprazole (PRILOSEC) 20 MG capsule Take 20 mg by mouth daily.    [provider]  Polyethyl Glycol-Propyl Glycol (SYSTANE FREE OP) Place 1 drop into both eyes  daily. For dry eyes    [provider]    Allergies    Patient has no known allergies.  Review of Systems   Review of Systems  Gastrointestinal: Positive for constipation.  All other systems reviewed and are negative.   Physical Exam Updated Vital Signs BP (!) 134/101   Pulse (!) 53   Temp 98.5 F (36.9 C) (Oral)   Resp 18   SpO2 95%   Physical Exam Vitals and nursing note reviewed.  Constitutional:      General: She is not in acute distress.    Appearance: Normal appearance. She is well-developed.  HENT:     Head: Normocephalic and atraumatic.  Eyes:     Conjunctiva/sclera: Conjunctivae normal.     Pupils: Pupils are equal, round, and reactive to light.  Cardiovascular:     Rate and Rhythm: Normal rate and regular rhythm.     Heart sounds: Normal heart sounds.  Pulmonary:     Effort: Pulmonary effort is normal. No respiratory distress.     Breath sounds: Normal breath sounds.  Abdominal:     General: There is distension.     Palpations: Abdomen is soft. There is no mass.     Tenderness: There is no abdominal tenderness.  Musculoskeletal:        General: No deformity. Normal range of motion.     Cervical back: Normal range of motion and neck supple.  Skin:    General: Skin is warm and dry.  Neurological:     General: No focal deficit present.     Mental Status: She is alert and oriented to person, place, and time. Mental status is at baseline.     ED Results / Procedures / Treatments   Labs (all labs ordered are listed, but only abnormal results are displayed) Labs Reviewed  COMPREHENSIVE METABOLIC PANEL  LIPASE, BLOOD  CBC WITH DIFFERENTIAL/PLATELET  URINALYSIS, ROUTINE W REFLEX MICROSCOPIC    EKG None  Radiology No results found.  Procedures Procedures (including critical care time)  Medications Ordered in ED Medications - No data to display  ED Course  I have reviewed the triage vital signs and the nursing notes.  Pertinent labs  & imaging results that were available during my care of the patient were reviewed by me and considered in my medical decision making (see chart for details).    MDM Rules/Calculators/A&P                      MDM  Screen complete  [name] was evaluated in Emergency Department on 08/23/2019 for the symptoms described in the history of present illness. She was evaluated in the context of the global COVID-19 pandemic, which necessitated consideration that the patient might be at risk for infection with the SARS-CoV-2 virus that causes COVID-19. Institutional protocols and algorithms that pertain to the evaluation of patients at  risk for COVID-19 are in a state of rapid change based on information released by regulatory bodies including the CDC and federal and state organizations. These policies and algorithms were followed during the patient's care in the ED.  Presenting for evaluation of worsening symptoms of constipation with associated abdominal distention.  Patient's presentation is concerning for possible bowel obstruction.  Work-up will include screening labs for abdominal pathology and CT abdomen pelvis.  Pending studies and disposition discussed with Dr. Stark Jock during signout.    Final Clinical Impression(s) / ED Diagnoses Final diagnoses:  None    Rx / DC Orders ED Discharge Orders    None       Valarie Merino, MD 08/23/19 1425

## 2019-08-23 NOTE — H&P (Signed)
History and Physical    PLEASE NOTE THAT DRAGON DICTATION SOFTWARE WAS USED IN THE CONSTRUCTION OF THIS NOTE.   [name] [id number] DOB: [birth year] DOA: 08/23/2019  PCP: [name], MD Patient coming from: home   I have personally briefly reviewed patient's old medical records in [place]  Chief Complaint: Abdominal pain  HPI: [name] is a 86 y.o. female with medical history significant for peripheral artery disease status post left BKA in 2016 followed by right BKA in 2019, hypertension, who is admitted to [facility] on 08/23/2019 with large bowel obstruction after presenting from home to [facility] Emergency Department complaining of abdominal pain.   The patient reports 3 to 4 days of generalized crampy abdominal discomfort.  While she describes the distribution of this discomfort is generalized, she reports that the discomfort is most intense in the bilateral lower abdominal quadrants, worsening with palpation over the abdomen.  She reports no bowel movement over the last 3 to 4 days, which represents a change from her baseline bowel habits, which she typically experiences a bowel movement on a daily basis.  She also notes diminished flatus production over the last few days. She notes a decrease in caliber size of her stool in the weeks to months leading up to the present time, but denies any associated melena or hematochezia.  She is unsure as to the timing of her most recent colonoscopy.  Denies any associated nausea or vomiting, although reports a decrease in appetite over the course of the last month.  Does not believe that she has experienced any unintentional weight loss recently.  Denies any recent trauma.  Denies any associated subjective fever, chills, rigors, or generalized myalgias.  Denies any associated dysuria, gross hematuria, or change in urinary urgency/frequency.  No preceding diarrhea.  Denies any recent headache,  neck stiffness, rhinitis, rhinorrhea, sore throat, sob, cough, or rash. No known COVID-19 exposures. Denies any recent chest pain, diaphoresis, or palpitations.  Medical history notable for history of uterine cancer, prompting partial hysterectomy at age 54.  The patient reports that she did not require any chemotherapy or radiation.  She otherwise denies any personal history of malignancy.    ED Course:  Vital signs in the ED were notable for the following: Temperature max 98.5; heart rate 73-87; blood pressure 99/70 - 119/74; respiratory rate 18; oxygen saturation 97 to 98% on room air.  Labs were notable for the following: CMP notable for sodium 136, potassium 3.9, bicarbonate 25, BUN 26, creatinine 0.45, albumin 3.3, alkaline phosphatase 121, AST 21, ALT 15, total bilirubin 0.5.  Lipase 18.  CBC notable for white blood cell count of 8200.  Urinalysis showed 11-20 white blood cells in the context of 6-10 squamous epithelial cells, rare bacteria, and was found to be nitrate negative.  Urinalysis also notable for elevated specific gravity 1.023.  Nasopharyngeal COVID-19 PCR swab performed in the ED this evening, with result currently pending.  CT abdomen/pelvis with contrast showed evidence of infiltrating sigmoid colon mass, concerning for colon cancer, with resulting distal colonic obstruction as well as a large peripherally enhancing mass involving the right hepatic lobe with additional smaller lesions in the right hepatic lobe, concerning for metastatic colon cancer.  No evidence of associated bowel perforation or abscess.   The ED physician discussed the patient's case and imaging with the on-call oncologist, Dr. Lorenso Courier, who felt that oncologic involvement was slightly premature at this time in the absence of histopathologic  results. Dr. Lorenso Courier recommended consulting oncology once histopathologic results available following biopsy.  Subsequently, the ED physician then consulted on-call general  surgeon, Dr. Brantley Stage, who felt that intervention options that concomitantly be therapeutic and resolving large bowel obstruction will also diagnostic and obtaining tissue for histopathological identification included diverting colectomy versus colonic stent by GI. Dr. Brantley Stage recommended keeping the patient n.p.o. and consulting gastroenterology in order to establish multidisciplinary consideration and developing plan for most appropriate treatment/diagnostic modality.    Review of Systems: As per HPI otherwise 10 point review of systems negative.   Past Medical History:  Diagnosis Date  . Arthritis   . Cancer ([place])    utertrine  . Hypertension   . Osteoporosis 04/15/2017  . PAD (peripheral artery disease) ([place])     Past Surgical History:  Procedure Laterality Date  . ABDOMINAL HYSTERECTOMY     partial  . AMPUTATION Left 01/29/2015   Procedure: Left  AMPUTATION BELOW KNEE;  Surgeon: Elam Dutch, MD;  Location: [place];  Service: Vascular;  Laterality: Left;  . AMPUTATION Right 06/17/2017   Procedure: Right  BELOW KNEE Amputation;  Surgeon: Elam Dutch, MD;  Location: [place];  Service: Vascular;  Laterality: Right;  . KNEE SURGERY Right 1984  . PERIPHERAL VASCULAR CATHETERIZATION N/A 09/15/2014   Procedure: Abdominal Aortogram;  Surgeon: Elam Dutch, MD;  Location: [facility] INVASIVE CV LAB CUPID;  Service: Cardiovascular;  Laterality: N/A;  . PERIPHERAL VASCULAR CATHETERIZATION Bilateral 09/15/2014   Procedure: Lower Extremity Angiography;  Surgeon: Elam Dutch, MD;  Location: [place] INVASIVE CV LAB CUPID;  Service: Cardiovascular;  Laterality: Bilateral;  . SPINE SURGERY      Social History:  reports that she has never smoked. She has never used smokeless tobacco. She reports that she does not drink alcohol or use drugs.   No Known Allergies  Family History  Problem Relation Age of Onset  . Diabetes Mother   . Heart disease Mother   . Dementia Sister   . Diabetes Son      Prior to Admission medications   Medication Sig Start Date End Date Taking? Authorizing Provider  acetaminophen (TYLENOL) 325 MG tablet Take 1-2 tablets (325-650 mg total) by mouth every 4 (four) hours as needed for mild pain (or temp >/= 101 F). 06/19/17   Debbe Odea, MD  clopidogrel (PLAVIX) 75 MG tablet Take 1 tablet (75 mg total) by mouth daily. 07/18/19   Copland, Gay Filler, MD  ipratropium (ATROVENT) 0.03 % nasal spray Place 2 sprays into both nostrils 3 (three) times daily. Use as needed for allergies and nasal discharge 07/21/19   Copland, Gay Filler, MD  lisinopril (ZESTRIL) 20 MG tablet Take 1 tablet (20 mg total) by mouth daily. 07/18/19   Copland, Gay Filler, MD  omeprazole (PRILOSEC) 20 MG capsule Take 20 mg by mouth daily.    [provider]  Polyethyl Glycol-Propyl Glycol (SYSTANE FREE OP) Place 1 drop into both eyes daily. For dry eyes    [provider]     Objective    Physical Exam: Vitals:   08/23/19 1530 08/23/19 1615 08/23/19 1630 08/23/19 1645  BP: 113/61 119/69 113/66 (!) 147/131  Pulse: 85 84 81 79  Resp:      Temp:      TempSrc:      SpO2: 97% 97% 97% 98%    General: appears to be stated age; alert, oriented Skin: warm, dry, no rash Head:  AT/ Eyes:  PEARL b/l, EOMI  Mouth:  Oral mucosa membranes appear dry, normal dentition Neck: supple; trachea midline Heart:  RRR; did not appreciate any M/R/G Lungs: CTAB, did not appreciate any wheezes, rales, or rhonchi Abdomen: hypoactive bowel sounds; soft, mildly distended; mild tenderness to palpation over all 4 abdominal quadrants in the absence of any associated guarding, rigidity, or rebound tenderness. Extremities: Bilateral BKA's noted; no erythema or edema noted.   Labs on Admission: I have personally reviewed following labs and imaging studies  CBC: Recent Labs  Lab 08/23/19 1404  WBC 8.2  NEUTROABS 5.8  HGB 11.9*  HCT 36.5  MCV 91.7  PLT 123XX123   Basic Metabolic Panel: Recent  Labs  Lab 08/23/19 1404  NA 136  K 3.9  CL 101  CO2 25  GLUCOSE 117*  BUN 26*  CREATININE 0.45  CALCIUM 9.0   GFR: CrCl cannot be calculated (Unknown ideal weight.). Liver Function Tests: Recent Labs  Lab 08/23/19 1404  AST 21  ALT 15  ALKPHOS 121  BILITOT 0.5  PROT 6.5  ALBUMIN 3.3*   Recent Labs  Lab 08/23/19 1404  LIPASE 18   No results for input(s): AMMONIA in the last 168 hours. Coagulation Profile: No results for input(s): INR, PROTIME in the last 168 hours. Cardiac Enzymes: No results for input(s): CKTOTAL, CKMB, CKMBINDEX, TROPONINI in the last 168 hours. BNP (last 3 results) No results for input(s): PROBNP in the last 8760 hours. HbA1C: No results for input(s): HGBA1C in the last 72 hours. CBG: No results for input(s): GLUCAP in the last 168 hours. Lipid Profile: No results for input(s): CHOL, HDL, LDLCALC, TRIG, CHOLHDL, LDLDIRECT in the last 72 hours. Thyroid Function Tests: No results for input(s): TSH, T4TOTAL, FREET4, T3FREE, THYROIDAB in the last 72 hours. Anemia Panel: No results for input(s): VITAMINB12, FOLATE, FERRITIN, TIBC, IRON, RETICCTPCT in the last 72 hours. Urine analysis:    Component Value Date/Time   COLORURINE YELLOW 08/23/2019 1419   APPEARANCEUR HAZY (A) 08/23/2019 1419   LABSPEC 1.023 08/23/2019 1419   PHURINE 5.0 08/23/2019 1419   GLUCOSEU NEGATIVE 08/23/2019 1419   HGBUR NEGATIVE 08/23/2019 1419   BILIRUBINUR NEGATIVE 08/23/2019 1419   KETONESUR 5 (A) 08/23/2019 1419   PROTEINUR NEGATIVE 08/23/2019 1419   NITRITE NEGATIVE 08/23/2019 1419   LEUKOCYTESUR SMALL (A) 08/23/2019 1419    Radiological Exams on Admission: CT Abdomen Pelvis W Contrast  Result Date: 08/23/2019 CLINICAL DATA:  Abdominal distension and constipation. Intermittent vomiting. Suspected bowel obstruction. EXAM: CT ABDOMEN AND PELVIS WITH CONTRAST TECHNIQUE: Multidetector CT imaging of the abdomen and pelvis was performed using the standard protocol  following bolus administration of intravenous contrast. CONTRAST:  176mL OMNIPAQUE IOHEXOL 300 MG/ML  SOLN COMPARISON:  None. FINDINGS: Lower chest: Small dependent left pleural effusion with mild dependent left lower lobe atelectasis. The right lung base is clear. Atherosclerosis of the aorta and coronary arteries. There is a small hiatal hernia with mild distal esophageal wall thickening. Hepatobiliary: There is a large peripherally enhancing mass involving the right hepatic lobe which measures 10.0 x 7.2 x 11.3 cm. There are additional smaller lesions in the right lobe measuring 14 mm on image 15/2 and 14 mm on image 32/2. No definite lesions in the left lobe. There are no underlying morphologic changes of cirrhosis. No evidence of gallstones, gallbladder wall thickening or biliary dilatation. Pancreas: Unremarkable. No pancreatic ductal dilatation or surrounding inflammatory changes. Spleen: Normal in size without focal abnormality. Adrenals/Urinary Tract: Both adrenal glands appear normal. The kidneys appear normal without  evidence of urinary tract calculus, suspicious lesion or hydronephrosis. No bladder abnormalities are seen. Stomach/Bowel: The stomach and proximal small bowel are decompressed. There are several mildly dilated and fluid-filled loops of mid small-bowel with long segment small bowel wall thickening and hyperenhancement in the right lower quadrant. The terminal ileum is decompressed. There is at least moderate diffuse distention of the colon with associated air-fluid levels. There is fairly abrupt change in caliber in the proximal sigmoid colon with suspicion of an infiltrating sigmoid colon mass (axial images 60 through 62 of series 2), suspicious for colon cancer. The rectosigmoid colon is decompressed. Vascular/Lymphatic: There are no enlarged abdominal or pelvic lymph nodes. Diffuse aortic and branch vessel atherosclerosis. No evidence of large vessel occlusion. The portal, superior  mesenteric and splenic veins are patent. Reproductive: Hysterectomy. No adnexal mass. Other: Small amount of pelvic ascites. No definite peritoneal nodularity. No evidence of abdominal wall hernia. Musculoskeletal: No acute or significant osseous findings. There are degenerative changes throughout the lumbar spine associated with a convex left scoliosis. There is sclerosis of both femoral heads, not typical for AVN. No evidence of osseous metastatic disease. IMPRESSION: 1. Suspected infiltrating sigmoid colon mass, highly suspicious for colon cancer, with resulting distal colonic obstruction. 2. Large peripherally enhancing mass involving the right hepatic lobe with additional smaller lesions in the right lobe, most consistent with metastatic colon cancer. There are no morphologic changes of cirrhosis to suggest hepatocellular carcinoma. 3. Long segment wall thickening and hyperenhancement in the distal small bowel, without focal mass lesion, possibly inflammatory. There is resulting mild proximal upstream small-bowel dilatation. 4. Small dependent left pleural effusion with mild dependent left lower lobe atelectasis. Small amount of ascites without definite peritoneal nodularity. 5. Aortic Atherosclerosis (ICD10-I70.0). Electronically Signed   By: Richardean Sale M.D.   On: 08/23/2019 16:45    Assessment/Plan   [name] is a 86 y.o. female with medical history significant for peripheral artery disease status post left BKA in 2016 followed by right BKA in 2019, hypertension, who is admitted to [facility] on 08/23/2019 with large bowel obstruction after presenting from home to [facility] Department complaining of abdominal pain.    Principal Problem:   Large bowel obstruction (HCC) Active Problems:   Essential hypertension   Abdominal pain   Dehydration   Acute prerenal azotemia   #) Large bowel obstruction: Diagnosis on the basis of 3 to 4 days of progressive  generalized abdominal discomfort associated with no bowel movement over the last 3 to 4 days and interval decline in flatus production, with presenting CT abdomen/pelvis showing evidence of a distal colonic obstruction that appears to be due to a sigmoid colon mass, concerning for metastatic colon cancer which would represent a new diagnosis for the patient. Dr. Brantley Stage of general surgery consulted, with recommendation for consultation gastroenterology for multidisciplinary consensus regarding most reasonable intervention suspected colon cancer as precipitating source, as further described above. Dr. Brantley Stage recommended keeping the patient n.p.o., with consideration for placement of NG tube should the patient subsequently developed nausea/vomiting.  Of note, CT abdomen/pelvis showed no evidence of bowel perforation or abscess.  The patient currently reports adequate pain control.  Plan: NPO.  IV fluids in the form of LR at 75 cc/h.  As needed morphine.  As needed IV Zofran. Per general surgery recommendations, consider gastroenterology consultation, as above. Will consider placing NG tube as the patient subsequently developed nausea/vomiting.  Repeat CMP in the morning.  Add on serum magnesium  level.      #) Sigmoid mass: Presenting CT abdomen/pelvis with contrast showed evidence of infiltrating sigmoid colon mass, concerning for colon cancer, with resulting distal colonic obstruction as well as a large peripherally enhancing mass involving the right hepatic lobe with additional smaller lesions in the right hepatic lobe, concerning for metastatic colon cancer, which would represent a new diagnosis for the patient.  As described above, will need histopathologic results prior to oncologic involvement.  Options for obtaining tissue for histopathologic evaluation, as further discussed above, with input from general surgery as documented above.   Plan: Anticipate additional discussions with general surgery  regarding therapeutic/diagnostic options in the setting of large bowel obstruction due to new sigmoid mass, with potential consultation of gastroenterology in the morning for pursuit of multidisciplinary consensus towards this therapeutic/diagnostic objective.  NPO.  As needed IV morphine.      #) Asymptomatic pyuria: Presenting urinalysis reflects quantitatively insignificant pyuria with 11-20 white blood cells, associated with a urine sample that may be contaminated as evidenced by presence of significant quantity of squamous epithelial cells.  Given suspected contaminated specimen, quantitatively insignificant pyuria, in the absence of acute urinary symptoms, there does not appear to be clinical evidence of underlying urinary tract infection and consequently now indication for initiation of IV antibiotics for such.   Plan: Repeat CBC in the morning.      #) Dehydration: This is on the basis of presenting labs reflecting acute prerenal azotemia as well as urinalysis showing evidence of elevated specific gravity, all of which appears to be on the basis of intravascular depletion stemming from dehydration due to recent decline in oral intake, further exacerbated by presenting acute to large bowel obstruction, as above.  Does not appear to be associated with an acute kidney injury at this time.  Plan: IV fluids, as above.  Work-up and management presenting large bowel obstruction, as described above.  Monitor strict I's and O's and daily weights.  Repeat BMP in the morning.     #) Essential hypertension: On lisinopril as an outpatient.  Systolic blood pressures appear normotensive since presentation to the ED today.  Plan: In the setting of current n.p.o. status in the context of presenting large bowel obstruction, will hold home lisinopril for now.  Close monitoring of ensuing blood pressure via routine vital signs.     #) GERD: On omeprazole as an outpatient.  Plan: In the setting of  current n.p.o. status, will hold home oral omeprazole for now.  Could consider introducing IV Protonix while still NPO.      DVT prophylaxis: (none: Refraining from pharmacologic anticoagulation given potential surgical intervention; refraining from SCDs in the context of bilateral BKA's).  Code Status: Per my discussions with the patient this evening, she conveys her wish to be DNR/DNI. Family Communication: none Disposition Plan: Per Rounding Team Consults called: Case was discussed with the on-call oncologist, Dr. Lorenso Courier, as well as the on-call general surgeon, Dr. Brantley Stage, as further described above. Admission status: Inpatient; med telemetry (in setting of borderline hypotension).    PLEASE NOTE THAT DRAGON DICTATION SOFTWARE WAS USED IN THE CONSTRUCTION OF THIS NOTE.   [facility] Pager [phone] From [place].   Otherwise, please contact night-coverage  [email or web address] Password [facility]  08/23/2019, 6:13 PM

## 2019-08-23 NOTE — ED Provider Notes (Signed)
Care assumed from Dr. Francia Greaves at shift change.  Patient awaiting results of a CT scan to rule out intra-abdominal pathology.  Patient has been constipated for the past several days.  She is also having abdominal discomfort and distention.  Patient has history of peripheral vascular disease with bilateral below the knee amputations, but no prior abdominal issues.  Her CT scan has resulted and shows what appears to be colonic obstruction secondary to metastatic colon cancer.  This has been previously undetected.  I have discussed the care with Dr. Lorenso Courier from oncology who does not feel as though he has much to offer at this point until a tissue sample has been obtained.  I have also discussed with Dr. Brantley Stage from general surgery who does not feel the patient is an ideal surgical candidate, but will consult and discuss options.  I have spoken with Dr. Domenick Gong from the hospitalist service who agrees to admit.   Veryl Speak, MD 08/23/19 [phone]

## 2019-08-23 NOTE — Consult Note (Signed)
Reason for Consult: Sigmoid colon mass and large bowel obstruction Referring Physician: [name] is an 86 y.o. female.  HPI: Asked see patient at the request of Dr. Stark Jock for a 5-day history of obstipation.  The patient states her last bowel movement was 5 days ago.  She states prior to that her stools were thin caliber with no blood.  She has had no significant nausea or vomiting but more abdominal distention and cramping.  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  Denies any recent weight loss.  The pain in her abdomen is diffuse mild to moderate describes crampy.  Past Medical History:  Diagnosis Date  . Arthritis   . Cancer ([place])    utertrine  . Hypertension   . Osteoporosis 04/15/2017  . PAD (peripheral artery disease) ([place])     Past Surgical History:  Procedure Laterality Date  . ABDOMINAL HYSTERECTOMY     partial  . AMPUTATION Left 01/29/2015   Procedure: Left  AMPUTATION BELOW KNEE;  Surgeon: Elam Dutch, MD;  Location: [place];  Service: Vascular;  Laterality: Left;  . AMPUTATION Right 06/17/2017   Procedure: Right  BELOW KNEE Amputation;  Surgeon: Elam Dutch, MD;  Location: [place];  Service: Vascular;  Laterality: Right;  . KNEE SURGERY Right 1984  . PERIPHERAL VASCULAR CATHETERIZATION N/A 09/15/2014   Procedure: Abdominal Aortogram;  Surgeon: Elam Dutch, MD;  Location: [facility] INVASIVE CV LAB CUPID;  Service: Cardiovascular;  Laterality: N/A;  . PERIPHERAL VASCULAR CATHETERIZATION Bilateral 09/15/2014   Procedure: Lower Extremity Angiography;  Surgeon: Elam Dutch, MD;  Location: [place] INVASIVE CV LAB CUPID;  Service: Cardiovascular;  Laterality: Bilateral;  . SPINE SURGERY      Family History  Problem Relation Age of Onset  . Diabetes Mother   . Heart disease Mother   . Dementia Sister   . Diabetes Son     Social History:  reports that she has never smoked. She has never used  smokeless tobacco. She reports that she does not drink alcohol or use drugs.  Allergies: No Known Allergies  Medications: I have reviewed the patient's current medications.  Results for orders placed or performed during the hospital encounter of 08/23/19 (from the past 48 hour(s))  Comprehensive metabolic panel     Status: Abnormal   Collection Time: 08/23/19  2:04 PM  Result Value Ref Range   Sodium 136 135 - 145 mmol/L   Potassium 3.9 3.5 - 5.1 mmol/L   Chloride 101 98 - 111 mmol/L   CO2 25 22 - 32 mmol/L   Glucose, Bld 117 (H) 70 - 99 mg/dL    Comment: Glucose reference range applies only to samples taken after fasting for at least 8 hours.   BUN 26 (H) 8 - 23 mg/dL   Creatinine, Ser 0.45 0.44 - 1.00 mg/dL   Calcium 9.0 8.9 - 10.3 mg/dL   Total Protein 6.5 6.5 - 8.1 g/dL   Albumin 3.3 (L) 3.5 - 5.0 g/dL   AST 21 15 - 41 U/L   ALT 15 0 - 44 U/L   Alkaline Phosphatase 121 38 - 126 U/L   Total Bilirubin 0.5 0.3 - 1.2 mg/dL   GFR calc non Af Amer >60 >60 mL/min   GFR calc Af Amer >60 >60 mL/min   Anion gap 10 5 - 15    Comment: Performed at [facility], [address] [place], [place] [address]  Lipase, blood     Status: None   Collection Time: 08/23/19  2:04 PM  Result Value Ref Range   Lipase 18 11 - 51 U/L    Comment: Performed at [facility], [place] [address], [place], [place] [address]  CBC with Differential     Status: Abnormal   Collection Time: 08/23/19  2:04 PM  Result Value Ref Range   WBC 8.2 4.0 - 10.5 K/uL   RBC 3.98 3.87 - 5.11 MIL/uL   Hemoglobin 11.9 (L) 12.0 - 15.0 g/dL   HCT 36.5 36.0 - 46.0 %   MCV 91.7 80.0 - 100.0 fL   MCH 29.9 26.0 - 34.0 pg   MCHC 32.6 30.0 - 36.0 g/dL   RDW 14.4 11.5 - 15.5 %   Platelets 368 150 - 400 K/uL   nRBC 0.0 0.0 - 0.2 %   Neutrophils Relative % 71 %   Neutro Abs 5.8 1.7 - 7.7 K/uL   Lymphocytes Relative 15 %   Lymphs Abs 1.3 0.7 - 4.0 K/uL   Monocytes Relative 14 %   Monocytes  Absolute 1.2 (H) 0.1 - 1.0 K/uL   Eosinophils Relative 0 %   Eosinophils Absolute 0.0 0.0 - 0.5 K/uL   Basophils Relative 0 %   Basophils Absolute 0.0 0.0 - 0.1 K/uL   Immature Granulocytes 0 %   Abs Immature Granulocytes 0.03 0.00 - 0.07 K/uL    Comment: Performed at [facility], [place] [address], [place], [place] [address]  Urinalysis, Routine w reflex microscopic     Status: Abnormal   Collection Time: 08/23/19  2:19 PM  Result Value Ref Range   Color, Urine YELLOW YELLOW   APPearance HAZY (A) CLEAR   Specific Gravity, Urine 1.023 1.005 - 1.030   pH 5.0 5.0 - 8.0   Glucose, UA NEGATIVE NEGATIVE mg/dL   Hgb urine dipstick NEGATIVE NEGATIVE   Bilirubin Urine NEGATIVE NEGATIVE   Ketones, ur 5 (A) NEGATIVE mg/dL   Protein, ur NEGATIVE NEGATIVE mg/dL   Nitrite NEGATIVE NEGATIVE   Leukocytes,Ua SMALL (A) NEGATIVE   RBC / HPF 0-5 0 - 5 RBC/hpf   WBC, UA 11-20 0 - 5 WBC/hpf   Bacteria, UA RARE (A) NONE SEEN   Squamous Epithelial / LPF 6-10 0 - 5   Mucus PRESENT    Hyaline Casts, UA PRESENT     Comment: Performed at [facility], [place] [address] [place], [place] [address]    CT Abdomen Pelvis W Contrast  Result Date: 08/23/2019 CLINICAL DATA:  Abdominal distension and constipation. Intermittent vomiting. Suspected bowel obstruction. EXAM: CT ABDOMEN AND PELVIS WITH CONTRAST TECHNIQUE: Multidetector CT imaging of the abdomen and pelvis was performed using the standard protocol following bolus administration of intravenous contrast. CONTRAST:  152mL OMNIPAQUE IOHEXOL 300 MG/ML  SOLN COMPARISON:  None. FINDINGS: Lower chest: Small dependent left pleural effusion with mild dependent left lower lobe atelectasis. The right lung base is clear. Atherosclerosis of the aorta and coronary arteries. There is a small hiatal hernia with mild distal esophageal wall thickening. Hepatobiliary: There is a large peripherally enhancing mass involving the right hepatic lobe  which measures 10.0 x 7.2 x 11.3 cm. There are additional smaller lesions in the right lobe measuring 14 mm on image 15/2 and 14 mm on image 32/2. No definite lesions in the left lobe. There are no underlying morphologic changes of cirrhosis. No evidence of gallstones, gallbladder  wall thickening or biliary dilatation. Pancreas: Unremarkable. No pancreatic ductal dilatation or surrounding inflammatory changes. Spleen: Normal in size without focal abnormality. Adrenals/Urinary Tract: Both adrenal glands appear normal. The kidneys appear normal without evidence of urinary tract calculus, suspicious lesion or hydronephrosis. No bladder abnormalities are seen. Stomach/Bowel: The stomach and proximal small bowel are decompressed. There are several mildly dilated and fluid-filled loops of mid small-bowel with long segment small bowel wall thickening and hyperenhancement in the right lower quadrant. The terminal ileum is decompressed. There is at least moderate diffuse distention of the colon with associated air-fluid levels. There is fairly abrupt change in caliber in the proximal sigmoid colon with suspicion of an infiltrating sigmoid colon mass (axial images 60 through 62 of series 2), suspicious for colon cancer. The rectosigmoid colon is decompressed. Vascular/Lymphatic: There are no enlarged abdominal or pelvic lymph nodes. Diffuse aortic and branch vessel atherosclerosis. No evidence of large vessel occlusion. The portal, superior mesenteric and splenic veins are patent. Reproductive: Hysterectomy. No adnexal mass. Other: Small amount of pelvic ascites. No definite peritoneal nodularity. No evidence of abdominal wall hernia. Musculoskeletal: No acute or significant osseous findings. There are degenerative changes throughout the lumbar spine associated with a convex left scoliosis. There is sclerosis of both femoral heads, not typical for AVN. No evidence of osseous metastatic disease. IMPRESSION: 1. Suspected  infiltrating sigmoid colon mass, highly suspicious for colon cancer, with resulting distal colonic obstruction. 2. Large peripherally enhancing mass involving the right hepatic lobe with additional smaller lesions in the right lobe, most consistent with metastatic colon cancer. There are no morphologic changes of cirrhosis to suggest hepatocellular carcinoma. 3. Long segment wall thickening and hyperenhancement in the distal small bowel, without focal mass lesion, possibly inflammatory. There is resulting mild proximal upstream small-bowel dilatation. 4. Small dependent left pleural effusion with mild dependent left lower lobe atelectasis. Small amount of ascites without definite peritoneal nodularity. 5. Aortic Atherosclerosis (ICD10-I70.0). Electronically Signed   By: Richardean Sale M.D.   On: 08/23/2019 16:45    Review of Systems  Constitutional: Positive for appetite change and fatigue.  Gastrointestinal: Positive for abdominal distention, abdominal pain and constipation.  All other systems reviewed and are negative.  Blood pressure (!) 147/131, pulse 79, temperature 98.5 F (36.9 C), temperature source Oral, resp. rate 18, SpO2 98 %. Physical Exam  Constitutional: She is oriented to person, place, and time. She appears well-developed and well-nourished.  HENT:  Head: Normocephalic and atraumatic.  Eyes: Pupils are equal, round, and reactive to light. EOM are normal.  Cardiovascular: Normal rate and regular rhythm.  Respiratory: Effort normal and breath sounds normal. No respiratory distress.  GI: She exhibits distension. She exhibits no mass. There is abdominal tenderness. There is no rebound and no guarding.  Musculoskeletal:        General: Normal range of motion.     Cervical back: Normal range of motion and neck supple.     Comments: Bilateral lower extremity BKA  Neurological: She is alert and oriented to person, place, and time.  Skin: Skin is warm and dry.  Psychiatric: She has a  normal mood and affect. Her behavior is normal. Judgment and thought content normal.    Assessment/Plan: Colonic obstruction at sigmoid colon consistent with sigmoid colon cancer with metastases to liver Recommend oncology consultation, hospitalist consultation and GI consultation some point.  Options include diverting colostomy or consideration of colonic stent if candidate.  May require cardiac medical work-up given her history of peripheral vascular  disease to assess her operative risk.  She is not vomiting for now but would make n.p.o. and place NG tube if she does have any vomiting.    Gail Mcelrath A CornettMD  08/23/2019, 6:26 PM

## 2019-08-23 NOTE — ED Notes (Signed)
The patient's son [name] would like to be updated with any changes.

## 2019-08-24 ENCOUNTER — Inpatient Hospital Stay (HOSPITAL_COMMUNITY): Payer: Medicare Other

## 2019-08-24 DIAGNOSIS — N19 Unspecified kidney failure: Secondary | ICD-10-CM | POA: Diagnosis present

## 2019-08-24 DIAGNOSIS — Z01810 Encounter for preprocedural cardiovascular examination: Secondary | ICD-10-CM | POA: Diagnosis not present

## 2019-08-24 DIAGNOSIS — R7989 Other specified abnormal findings of blood chemistry: Secondary | ICD-10-CM | POA: Diagnosis present

## 2019-08-24 DIAGNOSIS — K56609 Unspecified intestinal obstruction, unspecified as to partial versus complete obstruction: Secondary | ICD-10-CM

## 2019-08-24 DIAGNOSIS — I351 Nonrheumatic aortic (valve) insufficiency: Secondary | ICD-10-CM

## 2019-08-24 DIAGNOSIS — E860 Dehydration: Secondary | ICD-10-CM | POA: Diagnosis present

## 2019-08-24 DIAGNOSIS — R109 Unspecified abdominal pain: Secondary | ICD-10-CM | POA: Diagnosis present

## 2019-08-24 LAB — COMPREHENSIVE METABOLIC PANEL
ALT: 16 U/L (ref 0–44)
AST: 26 U/L (ref 15–41)
Albumin: 3.4 g/dL — ABNORMAL LOW (ref 3.5–5.0)
Alkaline Phosphatase: 114 U/L (ref 38–126)
Anion gap: 13 (ref 5–15)
BUN: 25 mg/dL — ABNORMAL HIGH (ref 8–23)
CO2: 21 mmol/L — ABNORMAL LOW (ref 22–32)
Calcium: 8.7 mg/dL — ABNORMAL LOW (ref 8.9–10.3)
Chloride: 102 mmol/L (ref 98–111)
Creatinine, Ser: 0.43 mg/dL — ABNORMAL LOW (ref 0.44–1.00)
GFR calc Af Amer: >60 mL/min (ref >60)
GFR calc non Af Amer: >60 mL/min (ref >60)
Glucose, Bld: 88 mg/dL (ref 70–99)
Potassium: 3.7 mmol/L (ref 3.5–5.1)
Sodium: 136 mmol/L (ref 135–145)
Total Bilirubin: 0.7 mg/dL (ref 0.3–1.2)
Total Protein: 6.4 g/dL — ABNORMAL LOW (ref 6.5–8.1)

## 2019-08-24 LAB — CBC
HCT: 36.4 % (ref 36.0–46.0)
Hemoglobin: 11.7 g/dL — ABNORMAL LOW (ref 12.0–15.0)
MCH: 29.4 pg (ref 26.0–34.0)
MCHC: 32.1 g/dL (ref 30.0–36.0)
MCV: 91.5 fL (ref 80.0–100.0)
Platelets: 377 10*3/uL (ref 150–400)
RBC: 3.98 MIL/uL (ref 3.87–5.11)
RDW: 14.6 % (ref 11.5–15.5)
WBC: 7.7 10*3/uL (ref 4.0–10.5)
nRBC: 0 % (ref 0.0–0.2)

## 2019-08-24 LAB — PROTIME-INR
INR: 1.1 (ref 0.8–1.2)
Prothrombin Time: 14.3 seconds (ref 11.4–15.2)

## 2019-08-24 LAB — MAGNESIUM: Magnesium: 2.2 mg/dL (ref 1.7–2.4)

## 2019-08-24 LAB — ECHOCARDIOGRAM COMPLETE

## 2019-08-24 MED ORDER — FLEET ENEMA 7-19 GM/118ML RE ENEM
1.0000 | ENEMA | Freq: Once | RECTAL | Status: AC
  Administered 2019-08-25: 1 via RECTAL
  Filled 2019-08-24: qty 1

## 2019-08-24 NOTE — ED Notes (Signed)
Report given to Hannah,RN

## 2019-08-24 NOTE — Progress Notes (Addendum)
CC: Constipation/abdominal distension  Subjective: Patient says she has not had a BM for about 6 days.  She did have some nausea and vomiting prior to that, but none yesterday or today.  She did complain of some nausea this a.m. while examining her.  She thinks she had her last dose of Plavix at home.  Despite the abdominal distention she has had she was able to eat at home.  She has never had a colonoscopy.  She denies any history of chest pain or palpitations.  Objective: Vital signs in last 24 hours: Temp:  [98 F (36.7 C)-98.5 F (36.9 C)] 98 F (36.7 C) (04/13 2312) Pulse Rate:  [53-113] 79 (04/14 0630) Resp:  [14-19] 17 (04/14 0630) BP: (90-151)/(53-131) 151/79 (04/14 0630) SpO2:  [92 %-99 %] 98 % (04/14 0630)  NO I/O Afebrile, VSS Creatinine 0.43 CMP OK INR 1.1 COVID negative UA 11-20 WBC Intake/Output from previous day: 04/13 0701 - 04/14 0700 In: 513.4 [I.V.:513.4] Out: -  Intake/Output this shift: No intake/output data recorded.  General appearance: alert, cooperative, no distress and Having some nausea, very distended, she has some abdominal tenderness midline both on the left and the right side. Resp: clear to auscultation bilaterally Cardio: Regular rate and rhythm GI: She is extremely distended.  She has no point tenderness, no peritonitis.  She does complain of pain both on the left and right sides at the umbilicus.  No BM x6 days.  Did complain of some nausea currently.  No NG in place.  Low midline incision well healed. Extremities: Bilateral BKA's with bilateral femoral pulses.  Lab Results:  Recent Labs    08/23/19 1404 08/24/19 0450  WBC 8.2 7.7  HGB 11.9* 11.7*  HCT 36.5 36.4  PLT 368 377    BMET Recent Labs    08/23/19 1404 08/24/19 0450  NA 136 136  K 3.9 3.7  CL 101 102  CO2 25 21*  GLUCOSE 117* 88  BUN 26* 25*  CREATININE 0.45 0.43*  CALCIUM 9.0 8.7*   PT/INR Recent Labs    08/24/19 0450  LABPROT 14.3  INR 1.1     Recent Labs  Lab 08/23/19 1404 08/24/19 0450  AST 21 26  ALT 15 16  ALKPHOS 121 114  BILITOT 0.5 0.7  PROT 6.5 6.4*  ALBUMIN 3.3* 3.4*     Lipase     Component Value Date/Time   LIPASE 18 08/23/2019 1404     Prior to Admission medications   Medication Sig Start Date End Date Taking? Authorizing Provider  clopidogrel (PLAVIX) 75 MG tablet Take 1 tablet (75 mg total) by mouth daily. 07/18/19  Yes Copland, Gay Filler, MD  docusate sodium (COLACE) 100 MG capsule Take 100 mg by mouth 2 (two) times daily as needed for mild constipation or moderate constipation.   Yes [provider]  ipratropium (ATROVENT) 0.03 % nasal spray Place 2 sprays into both nostrils 3 (three) times daily. Use as needed for allergies and nasal discharge 07/21/19  Yes Copland, Gay Filler, MD  lisinopril (ZESTRIL) 20 MG tablet Take 1 tablet (20 mg total) by mouth daily. 07/18/19  Yes Copland, Gay Filler, MD  omeprazole (PRILOSEC) 20 MG capsule Take 20 mg by mouth daily.   Yes [provider]  Polyethyl Glycol-Propyl Glycol (SYSTANE FREE OP) Place 1 drop into both eyes daily. For dry eyes   Yes [provider]  acetaminophen (TYLENOL) 325 MG tablet Take 1-2 tablets (325-650 mg total) by mouth every  4 (four) hours as needed for mild pain (or temp >/= 101 F). Patient not taking: Reported on 08/23/2019 06/19/17   Debbe Odea, MD    Medications: . sodium chloride flush  3 mL Intravenous Q12H   . lactated ringers 75 mL/hr at 08/24/19 0448   Assessment/Plan PVD with left and right BKA's  - She does now have bilateral prosthesis/and does walk  - Plavix -LD 08/23/19 Hypertension Hx of uterine cancer with hysterctomy .  Constipation/abdominal distension Sigmoid colon obstruction, small bowel with thickening and liver mass Left pleural effusion/LLL atelectasis   FEN:  IV fluids/NPO ID:  None DVT:  SCD's Follow up: TBD  Plan: We will asked the floor staff to place an NG tube.  I have  called GI for evaluation and possible colonic stent placement, Dr. Penelope Coop will see her.  Dr. Redmond Pulling is going to talk with radiology about the best method for evaluating the small bowel thickening.  If she requires surgery she will also need a cardiology consult based on her next bilateral peripheral vascular disease. She will also need an oncology consult as noted by Dr. Brantley Stage.   We will follow with you.  I have ordered an NG and CEA.  I cannot get the NG in will wait till Dr. Penelope Coop sees and then decide on NG/IR.    LOS: 1 day    Catheleen Langhorne 08/24/2019 Please see Amion

## 2019-08-24 NOTE — ED Notes (Signed)
Patient woke up confused, noted to have removed IV. Patient reoriented.

## 2019-08-24 NOTE — Progress Notes (Signed)
Multiple attempts have been made by myself, Crstal Parker and Erskin Burnet, PA, to place NG tube.  Surgery and medical MDs aware.  Virginia Rochester, RN

## 2019-08-24 NOTE — Progress Notes (Signed)
Initial Nutrition Assessment  DOCUMENTATION CODES:   Not applicable  INTERVENTION:  Follow for diet advancement per GI/surgery recommendations  Recommend obtaining current wt to fully assess needs.   NUTRITION DIAGNOSIS:   Inadequate oral intake related to altered GI function(sigmoid colon mass with large distal colonic obstruction) as evidenced by NPO status.   GOAL:   Patient will meet greater than or equal to 90% of their needs    MONITOR:   Labs, I & O's, Diet advancement, Weight trends  REASON FOR ASSESSMENT:   Malnutrition Screening Tool    ASSESSMENT:  RD working remotely.   86 year old female with past medical history significant for PVD s/p left BKA (2016) followed by right BKA (2019), HTN, admitted on 4/13 for large bowel obstruction after presenting to ED from home with complaints of generalized crampy abdominal discomfort and no bowel movement over the last 3-4 days.  CT showed sigmoid colon mass with distal colonic obstruction with metastases to the liver.  Unable to obtain nutrition history at this time, pt completing cardiac ultrasound per pt location. Will continue to follow for diet advancement and further GI and surgery recommendations.  Per notes: -GI and general surgery consulted -continue npo IV fluids and supportive care -plan for flex sig -possible colonic stent placement -unable to place NG s/p multiple attempts -pending GI eval for NG/IR decisions -partial colectomy anticipated this admission -cardiac evaluation for preoperative risk assessment  Weight on admission recorded on 07/23/2017, recommend obtaining current wt to fully assess needs. Will use adjusted IBW for estimating needs at this time.   Medications and labs reviewed.  NUTRITION - FOCUSED PHYSICAL EXAM: Unable to complete at this time, RD working remotely.  Diet Order:   Diet Order            Diet NPO time specified  Diet effective now              EDUCATION NEEDS:    No education needs have been identified at this time  Skin:  Skin Assessment: Skin Integrity Issues: Skin Integrity Issues:: Other (Comment) Other: MASD; buttocks  Last BM:  4/8  Height:   Ht Readings from Last 1 Encounters:  07/23/17 5\' 7"  (1.702 m)    Weight:   Wt Readings from Last 1 Encounters:  07/23/17 59 kg    Ideal Body Weight:  54.1 kg(Adjusted IBW for bilateral BKA)  BMI:  There is no height or weight on file to calculate BMI.  Estimated Nutritional Needs:   Kcal:  1700-1900  Protein:  85-95  Fluid:  >/= 1.7 L/day   Lajuan Lines, RD, LDN Clinical Nutrition After Hours/Weekend Pager # [phone]

## 2019-08-24 NOTE — Telephone Encounter (Signed)
Called and spoke with her grandson [name], offered any support that I can provide

## 2019-08-24 NOTE — Consult Note (Signed)
Cardiology Consultation:   Patient ID: [name] MRN: [id number]; DOB: [birth year]  Admit date: 08/23/2019 Date of Consult: 08/24/2019  Primary Care Provider: [name], MD Primary Cardiologist: Evalina Field, MD  Primary Electrophysiologist:  None    Patient Profile:   [name] is a 86 y.o. female with a hx of PAD s/p left BKA in 2016 followed by R BKA in 219, HTN, LBBB, uterine cancer s/p hysterectomy at age 53 who is being seen today for the evaluation of pre-op cardiac evaluation at the request of Dr. Broadus John.  History of Present Illness:   [name] has no history of MI, stent, heart failure, arrhythmia, stroke, diabetes, HLD. She has PAD s/p B/L BKA for which she takes Plavix daily. Followed with Dr. Oneida Alar. She takes Lisinopril 20 mg for HTN. No prior echos in the chart. Denies tobacco, alcohol, drug use. Patient is relatively active given history. She uses a can or wheelchair at home and is able to care for herself for the most part. She has family to help with certain house chores and shopping.  She was admitted 4/13 with large bowel obstruction. She reported 3-4 days of generalized crampy feeling. No BM for 3-4 days and diminished flatus. Denied weight loss, BRB in stool, nausea, vomiting. She denied chest pain, sob, palpitations, lower leg edema. CT showed sigmoid colon cancer which is near obstructing with metastases to the liver. Surgery following.  GI consulted to see if patient is candidate for stent. If not might plans to proceed with operation per surgery. Cardiology was consulted for cardiac evaluation prior to possible procedure. Oncology consulted as well.   Past Medical History:  Diagnosis Date  . Arthritis   . Cancer ([place])    utertrine  . Hypertension   . Osteoporosis 04/15/2017  . PAD (peripheral artery disease) ([place])     Past Surgical History:  Procedure Laterality Date  . ABDOMINAL HYSTERECTOMY     partial  . AMPUTATION Left  01/29/2015   Procedure: Left  AMPUTATION BELOW KNEE;  Surgeon: Elam Dutch, MD;  Location: [place];  Service: Vascular;  Laterality: Left;  . AMPUTATION Right 06/17/2017   Procedure: Right  BELOW KNEE Amputation;  Surgeon: Elam Dutch, MD;  Location: [place];  Service: Vascular;  Laterality: Right;  . KNEE SURGERY Right 1984  . PERIPHERAL VASCULAR CATHETERIZATION N/A 09/15/2014   Procedure: Abdominal Aortogram;  Surgeon: Elam Dutch, MD;  Location: [facility] INVASIVE CV LAB CUPID;  Service: Cardiovascular;  Laterality: N/A;  . PERIPHERAL VASCULAR CATHETERIZATION Bilateral 09/15/2014   Procedure: Lower Extremity Angiography;  Surgeon: Elam Dutch, MD;  Location: [place] INVASIVE CV LAB CUPID;  Service: Cardiovascular;  Laterality: Bilateral;  . SPINE SURGERY       Home Medications:  Prior to Admission medications   Medication Sig Start Date End Date Taking? Authorizing Provider  clopidogrel (PLAVIX) 75 MG tablet Take 1 tablet (75 mg total) by mouth daily. 07/18/19  Yes Copland, Gay Filler, MD  docusate sodium (COLACE) 100 MG capsule Take 100 mg by mouth 2 (two) times daily as needed for mild constipation or moderate constipation.   Yes [provider]  ipratropium (ATROVENT) 0.03 % nasal spray Place 2 sprays into both nostrils 3 (three) times daily. Use as needed for allergies and nasal discharge 07/21/19  Yes Copland, Gay Filler, MD  lisinopril (ZESTRIL) 20 MG tablet Take 1 tablet (20 mg total) by mouth daily. 07/18/19  Yes Copland, Gay Filler, MD  omeprazole (PRILOSEC) 20 MG capsule Take 20 mg by mouth daily.   Yes [provider]  Polyethyl Glycol-Propyl Glycol (SYSTANE FREE OP) Place 1 drop into both eyes daily. For dry eyes   Yes [provider]  acetaminophen (TYLENOL) 325 MG tablet Take 1-2 tablets (325-650 mg total) by mouth every 4 (four) hours as needed for mild pain (or temp >/= 101 F). Patient not taking: Reported on 08/23/2019 06/19/17   Debbe Odea, MD     Inpatient Medications: Scheduled Meds: . sodium chloride flush  3 mL Intravenous Q12H  . [START ON 08/25/2019] sodium phosphate  1 enema Rectal Once   Continuous Infusions: . lactated ringers 75 mL/hr at 08/24/19 0939   PRN Meds: morphine injection, ondansetron (ZOFRAN) IV  Allergies:   No Known Allergies  Social History:   Social History   Socioeconomic History  . Marital status: Widowed    Spouse name: Not on file  . Number of children: Not on file  . Years of education: Not on file  . Highest education level: Not on file  Occupational History  . Not on file  Tobacco Use  . Smoking status: Never Smoker  . Smokeless tobacco: Never Used  Substance and Sexual Activity  . Alcohol use: No    Alcohol/week: 0.0 standard drinks  . Drug use: No  . Sexual activity: Not on file  Other Topics Concern  . Not on file  Social History Narrative  . Not on file   Social Determinants of Health   Financial Resource Strain:   . Difficulty of Paying Living Expenses:   Food Insecurity:   . Worried About Charity fundraiser in the Last Year:   . Arboriculturist in the Last Year:   Transportation Needs:   . Film/video editor (Medical):   Marland Kitchen Lack of Transportation (Non-Medical):   Physical Activity:   . Days of Exercise per Week:   . Minutes of Exercise per Session:   Stress:   . Feeling of Stress :   Social Connections:   . Frequency of Communication with Friends and Family:   . Frequency of Social Gatherings with Friends and Family:   . Attends Religious Services:   . Active Member of Clubs or Organizations:   . Attends Archivist Meetings:   Marland Kitchen Marital Status:   Intimate Partner Violence:   . Fear of Current or Ex-Partner:   . Emotionally Abused:   Marland Kitchen Physically Abused:   . Sexually Abused:     Family History:   Family History  Problem Relation Age of Onset  . Diabetes Mother   . Heart disease Mother   . Dementia Sister   . Diabetes Son      ROS:   Please see the history of present illness.  All other ROS reviewed and negative.     Physical Exam/Data:   Vitals:   08/24/19 0530 08/24/19 0630 08/24/19 0800 08/24/19 0845  BP: 110/69 (!) 151/79 115/77 127/81  Pulse: 76 79 82 90  Resp: 16 17 18 18   Temp:    98.1 F (36.7 C)  TempSrc:    Oral  SpO2: 98% 98% 96% 96%    Intake/Output Summary (Last 24 hours) at 08/24/2019 1237 Last data filed at 08/24/2019 1000 Gross per 24 hour  Intake 513.44 ml  Output 300 ml  Net 213.44 ml   Last 3 Weights 07/23/2017 07/16/2017 06/17/2017  Weight (lbs) 130 lb (No Data) 130 lb  Weight (  kg) 58.968 kg (No Data) 58.968 kg     There is no height or weight on file to calculate BMI.  General:  Well nourished, well developed, in no acute distress HEENT: normal Lymph: no adenopathy Neck: no JVD Endocrine:  No thryomegaly Vascular: No carotid bruits; FA pulses 2+ bilaterally without bruits  Cardiac:  normal S1, S2; RRR; + murmur  Lungs:  clear to auscultation bilaterally, no wheezing, rhonchi or rales  Abd: soft, nontender, no hepatomegaly  Ext: no edema Musculoskeletal:  No deformities, BUE and BLE strength normal and equal Skin: warm and dry  Neuro:  CNs 2-12 intact, no focal abnormalities noted Psych:  Normal affect   EKG:  The EKG was personally reviewed and demonstrates:  pending Telemetry:  Telemetry was personally reviewed and demonstrates:  N/A  Relevant CV Studies:  Echo ordered  Laboratory Data:  High Sensitivity Troponin:  No results for input(s): TROPONINIHS in the last 720 hours.   Chemistry Recent Labs  Lab 08/23/19 1404 08/24/19 0450  NA 136 136  K 3.9 3.7  CL 101 102  CO2 25 21*  GLUCOSE 117* 88  BUN 26* 25*  CREATININE 0.45 0.43*  CALCIUM 9.0 8.7*  GFRNONAA >60 >60  GFRAA >60 >60  ANIONGAP 10 13    Recent Labs  Lab 08/23/19 1404 08/24/19 0450  PROT 6.5 6.4*  ALBUMIN 3.3* 3.4*  AST 21 26  ALT 15 16  ALKPHOS 121 114  BILITOT 0.5 0.7   Hematology Recent  Labs  Lab 08/23/19 1404 08/24/19 0450  WBC 8.2 7.7  RBC 3.98 3.98  HGB 11.9* 11.7*  HCT 36.5 36.4  MCV 91.7 91.5  MCH 29.9 29.4  MCHC 32.6 32.1  RDW 14.4 14.6  PLT 368 377   BNPNo results for input(s): BNP, PROBNP in the last 168 hours.  DDimer No results for input(s): DDIMER in the last 168 hours.   Radiology/Studies:  CT Abdomen Pelvis W Contrast  Result Date: 08/23/2019 CLINICAL DATA:  Abdominal distension and constipation. Intermittent vomiting. Suspected bowel obstruction. EXAM: CT ABDOMEN AND PELVIS WITH CONTRAST TECHNIQUE: Multidetector CT imaging of the abdomen and pelvis was performed using the standard protocol following bolus administration of intravenous contrast. CONTRAST:  157mL OMNIPAQUE IOHEXOL 300 MG/ML  SOLN COMPARISON:  None. FINDINGS: Lower chest: Small dependent left pleural effusion with mild dependent left lower lobe atelectasis. The right lung base is clear. Atherosclerosis of the aorta and coronary arteries. There is a small hiatal hernia with mild distal esophageal wall thickening. Hepatobiliary: There is a large peripherally enhancing mass involving the right hepatic lobe which measures 10.0 x 7.2 x 11.3 cm. There are additional smaller lesions in the right lobe measuring 14 mm on image 15/2 and 14 mm on image 32/2. No definite lesions in the left lobe. There are no underlying morphologic changes of cirrhosis. No evidence of gallstones, gallbladder wall thickening or biliary dilatation. Pancreas: Unremarkable. No pancreatic ductal dilatation or surrounding inflammatory changes. Spleen: Normal in size without focal abnormality. Adrenals/Urinary Tract: Both adrenal glands appear normal. The kidneys appear normal without evidence of urinary tract calculus, suspicious lesion or hydronephrosis. No bladder abnormalities are seen. Stomach/Bowel: The stomach and proximal small bowel are decompressed. There are several mildly dilated and fluid-filled loops of mid small-bowel  with long segment small bowel wall thickening and hyperenhancement in the right lower quadrant. The terminal ileum is decompressed. There is at least moderate diffuse distention of the colon with associated air-fluid levels. There is fairly abrupt change  in caliber in the proximal sigmoid colon with suspicion of an infiltrating sigmoid colon mass (axial images 60 through 62 of series 2), suspicious for colon cancer. The rectosigmoid colon is decompressed. Vascular/Lymphatic: There are no enlarged abdominal or pelvic lymph nodes. Diffuse aortic and branch vessel atherosclerosis. No evidence of large vessel occlusion. The portal, superior mesenteric and splenic veins are patent. Reproductive: Hysterectomy. No adnexal mass. Other: Small amount of pelvic ascites. No definite peritoneal nodularity. No evidence of abdominal wall hernia. Musculoskeletal: No acute or significant osseous findings. There are degenerative changes throughout the lumbar spine associated with a convex left scoliosis. There is sclerosis of both femoral heads, not typical for AVN. No evidence of osseous metastatic disease. IMPRESSION: 1. Suspected infiltrating sigmoid colon mass, highly suspicious for colon cancer, with resulting distal colonic obstruction. 2. Large peripherally enhancing mass involving the right hepatic lobe with additional smaller lesions in the right lobe, most consistent with metastatic colon cancer. There are no morphologic changes of cirrhosis to suggest hepatocellular carcinoma. 3. Long segment wall thickening and hyperenhancement in the distal small bowel, without focal mass lesion, possibly inflammatory. There is resulting mild proximal upstream small-bowel dilatation. 4. Small dependent left pleural effusion with mild dependent left lower lobe atelectasis. Small amount of ascites without definite peritoneal nodularity. 5. Aortic Atherosclerosis (ICD10-I70.0). Electronically Signed   By: Richardean Sale M.D.   On:  08/23/2019 16:45    Assessment and Plan:   Pre-op cardiac evaluation for large bowel obstruction/colon cancer - Patient has h/o of PAD and HTN for which she is on medications - No h/o of MI, stent, heart failure, arrhythmia, stroke, diabetes, HLD.  - Given PAD history likely patient has CAD as well. Aortic atherosclerosis noted on CTA yesterday. No prior echos - She denies recent chest pain or sob - No alcohol, tobacco, drug use - Patient is s/p B/L BKA. She uses a wheelchair and walker at home. She is able to transfer herself. She has help when going out of the house - Check EKG - Murmur heard on exam>>echo ordered - METS 3.63. Patient is not very functional due to B/L BKA but still moderately functional. -  According to Revised cardiac Index risk she is Class I risk, 3.9% 30 day risk of death, MI, or cardiac arrest. She is at an acceptable risk for surgery given acute obstructive mass.   Murmur - echo as above  For questions or updates, please contact [place] Please consult [email or web address] for contact info under     Signed, Avalon Coppinger Ninfa Meeker, PA-C  08/24/2019 12:37 PM

## 2019-08-24 NOTE — Progress Notes (Signed)
Patient found with ng tube pulled out.  Patient stated that she bumped it while she was adjusting her glasses and it fell out.  Will let the on-call providers know.  Patient had placed in radiology and was unable to be placed by 2 bedside RNs and Surgery PA, so placement will likely have to wait until tomorrow.

## 2019-08-24 NOTE — Consult Note (Signed)
Subjective:   HPI  The patient is an 86 year old female who we are asked to see in regards to a sigmoid mass seen on CT scan.  The patient has been experiencing severe constipation over the past 3 to 5 days.  She was trying some MiraLAX without much help.  She came to the ER.  A CT scan showed what looks like a sigmoid cancer with near obstruction and metastases to the liver.     Past Medical History:  Diagnosis Date  . Arthritis   . Cancer ([place])    utertrine  . Hypertension   . Osteoporosis 04/15/2017  . PAD (peripheral artery disease) ([place])    Past Surgical History:  Procedure Laterality Date  . ABDOMINAL HYSTERECTOMY     partial  . AMPUTATION Left 01/29/2015   Procedure: Left  AMPUTATION BELOW KNEE;  Surgeon: Elam Dutch, MD;  Location: [place];  Service: Vascular;  Laterality: Left;  . AMPUTATION Right 06/17/2017   Procedure: Right  BELOW KNEE Amputation;  Surgeon: Elam Dutch, MD;  Location: [place];  Service: Vascular;  Laterality: Right;  . KNEE SURGERY Right 1984  . PERIPHERAL VASCULAR CATHETERIZATION N/A 09/15/2014   Procedure: Abdominal Aortogram;  Surgeon: Elam Dutch, MD;  Location: [facility] INVASIVE CV LAB CUPID;  Service: Cardiovascular;  Laterality: N/A;  . PERIPHERAL VASCULAR CATHETERIZATION Bilateral 09/15/2014   Procedure: Lower Extremity Angiography;  Surgeon: Elam Dutch, MD;  Location: [place] INVASIVE CV LAB CUPID;  Service: Cardiovascular;  Laterality: Bilateral;  . SPINE SURGERY     Social History   Socioeconomic History  . Marital status: Widowed    Spouse name: Not on file  . Number of children: Not on file  . Years of education: Not on file  . Highest education level: Not on file  Occupational History  . Not on file  Tobacco Use  . Smoking status: Never Smoker  . Smokeless tobacco: Never Used  Substance and Sexual Activity  . Alcohol use: No    Alcohol/week: 0.0 standard drinks  . Drug use: No  . Sexual activity: Not on file  Other Topics  Concern  . Not on file  Social History Narrative  . Not on file   Social Determinants of Health   Financial Resource Strain:   . Difficulty of Paying Living Expenses:   Food Insecurity:   . Worried About Charity fundraiser in the Last Year:   . Arboriculturist in the Last Year:   Transportation Needs:   . Film/video editor (Medical):   Marland Kitchen Lack of Transportation (Non-Medical):   Physical Activity:   . Days of Exercise per Week:   . Minutes of Exercise per Session:   Stress:   . Feeling of Stress :   Social Connections:   . Frequency of Communication with Friends and Family:   . Frequency of Social Gatherings with Friends and Family:   . Attends Religious Services:   . Active Member of Clubs or Organizations:   . Attends Archivist Meetings:   Marland Kitchen Marital Status:   Intimate Partner Violence:   . Fear of Current or Ex-Partner:   . Emotionally Abused:   Marland Kitchen Physically Abused:   . Sexually Abused:    family history includes Dementia in her sister; Diabetes in her mother and son; Heart disease in her mother.  Current Facility-Administered Medications:  .  lactated ringers infusion, , Intravenous, Continuous, Howerter, Justin B, DO, Last Rate: 75 mL/hr at  08/24/19 0939, New Bag at 08/24/19 0939 .  morphine 2 MG/ML injection 2 mg, 2 mg, Intravenous, Q2H PRN, Howerter, Justin B, DO .  ondansetron (ZOFRAN) injection 4 mg, 4 mg, Intravenous, Q6H PRN, Howerter, Justin B, DO .  sodium chloride flush (NS) 0.9 % injection 3 mL, 3 mL, Intravenous, Q12H, Howerter, Justin B, DO, 3 mL at 08/24/19 0938 .  [START ON 08/25/2019] sodium phosphate (FLEET) 7-19 GM/118ML enema 1 enema, 1 enema, Rectal, Once, Wonda Horner, MD No Known Allergies   Objective:     BP 127/81 (BP Location: Right Arm)   Pulse 90   Temp 98.1 F (36.7 C) (Oral)   Resp 18   SpO2 96%   She is in no distress  Heart regular rhythm  Lungs clear  Abdomen: Slight distention but no pain on  palpation  Laboratory No components found for: D1    Assessment:     Sigmoid mass most probably adenocarcinoma of the colon with probable early obstruction.  Also metastases to the liver.        Plan:     I have reviewed the surgical consult.  I think the first thing that we will do is a sigmoidoscopy to obtain biopsies and confirm diagnosis and also to see if we can pass the scope beyond the lesion.  Further decisions after sigmoidoscopy.

## 2019-08-24 NOTE — Progress Notes (Signed)
PROGRESS NOTE    [name]  [id number] DOB: 1931-07-19 DOA: 08/23/2019 PCP: Gail Mclean, MD  Brief Narrative:Ms. [name] has history of PAD s/p B/L BKA for which she takes Plavix daily. Followed with Dr. Oneida [name]. She was admitted 4/13 with large bowel obstruction. CT showed sigmoid colon mass with distal colonic obstruction with metastases to the liver. Surgery following.  GI consulted    Assessment & Plan:   Sigmoid colon mass with liver mets Distal colonic obstruction -GI and general surgery consulting, continue n.p.o. IV fluids and supportive care -Plan for flex sig -Anticipate will need partial colectomy this admission  Asymptomatic pyuria -No indication for antibiotics no symptoms, monitor at this time  Dehydration -Continue IV fluids today  History of PAD -Bilateral BKA -Hold Plavix  Essential hypertension -On lisinopril at baseline, blood pressure stable, hold lisinopril today  GERD -Continue PPI  DVT prophylaxis: SCDs  code Status: DNR Family Communication: No family at bedside will update grandson Disposition Plan: Patient is from home, plan for discharge home pending work-up and treatment of large obstructing colon mass    Consultants:   General surgery  [facility]   Procedures:   Antimicrobials:    Subjective: -Mild abdominal discomfort, no vomiting today Objective: Vitals:   08/24/19 0630 08/24/19 0800 08/24/19 0845 08/24/19 1504  BP: (!) 151/79 115/77 127/81 121/74  Pulse: 79 82 90 92  Resp: 17 18 18 15   Temp:   98.1 F (36.7 C) 98.3 F (36.8 C)  TempSrc:   Oral Oral  SpO2: 98% 96% 96% 98%    Intake/Output Summary (Last 24 hours) at 08/24/2019 1602 Last data filed at 08/24/2019 1000 Gross per 24 hour  Intake 513.44 ml  Output 300 ml  Net 213.44 ml   There were no vitals filed for this visit.  Examination:  General exam: Pleasant elderly female sitting up in bed, AAOx3 Respiratory system: Clear to  auscultation. Respiratory effort normal. Cardiovascular system: S1 & S2 heard, RRR.  Systolic ejection murmur  gastrointestinal system: Soft, mildly distended, bowel sounds diminished, nontender Central nervous system: Alert and oriented. No focal neurological deficits. Extremities: No edema, bilateral BKA Skin: No rashes Psychiatry: Judgement and insight appear normal. Mood & affect appropriate.     Data Reviewed:   CBC: Recent Labs  Lab 08/23/19 1404 08/24/19 0450  WBC 8.2 7.7  NEUTROABS 5.8  --   HGB 11.9* 11.7*  HCT 36.5 36.4  MCV 91.7 91.5  PLT 368 [id number]   Basic Metabolic Panel: Recent Labs  Lab 08/23/19 1404 08/24/19 0450  NA 136 136  K 3.9 3.7  CL 101 102  CO2 25 21*  GLUCOSE 117* 88  BUN 26* 25*  CREATININE 0.45 0.43*  CALCIUM 9.0 8.7*  MG  --  2.2   GFR: CrCl cannot be calculated (Unknown ideal weight.). Liver Function Tests: Recent Labs  Lab 08/23/19 1404 08/24/19 0450  AST 21 26  ALT 15 16  ALKPHOS 121 114  BILITOT 0.5 0.7  PROT 6.5 6.4*  ALBUMIN 3.3* 3.4*   Recent Labs  Lab 08/23/19 1404  LIPASE 18   No results for input(s): AMMONIA in the last 168 hours. Coagulation Profile: Recent Labs  Lab 08/24/19 0450  INR 1.1   Cardiac Enzymes: No results for input(s): CKTOTAL, CKMB, CKMBINDEX, TROPONINI in the last 168 hours. BNP (last 3 results) No results for input(s): PROBNP in the last 8760 hours. HbA1C: No results for input(s): HGBA1C in the last 72 hours. CBG: No  results for input(s): GLUCAP in the last 168 hours. Lipid Profile: No results for input(s): CHOL, HDL, LDLCALC, TRIG, CHOLHDL, LDLDIRECT in the last 72 hours. Thyroid Function Tests: No results for input(s): TSH, T4TOTAL, FREET4, T3FREE, THYROIDAB in the last 72 hours. Anemia Panel: No results for input(s): VITAMINB12, FOLATE, FERRITIN, TIBC, IRON, RETICCTPCT in the last 72 hours. Urine analysis:    Component Value Date/Time   COLORURINE YELLOW 08/23/2019 1419    APPEARANCEUR HAZY (A) 08/23/2019 1419   LABSPEC 1.023 08/23/2019 1419   PHURINE 5.0 08/23/2019 1419   GLUCOSEU NEGATIVE 08/23/2019 1419   HGBUR NEGATIVE 08/23/2019 1419   BILIRUBINUR NEGATIVE 08/23/2019 1419   KETONESUR 5 (A) 08/23/2019 1419   PROTEINUR NEGATIVE 08/23/2019 1419   NITRITE NEGATIVE 08/23/2019 1419   LEUKOCYTESUR SMALL (A) 08/23/2019 1419   Sepsis Labs: @LABRCNTIP (procalcitonin:4,lacticidven:4)  ) Recent Results (from the past 240 hour(s))  Respiratory Panel by RT PCR (Flu A&B, Covid) - Nasopharyngeal Swab     Status: None   Collection Time: 08/23/19  6:35 PM   Specimen: Nasopharyngeal Swab  Result Value Ref Range Status   SARS Coronavirus 2 by RT PCR NEGATIVE NEGATIVE Final    Comment: (NOTE) SARS-CoV-2 target nucleic acids are NOT DETECTED. The SARS-CoV-2 RNA is generally detectable in upper respiratoy specimens during the acute phase of infection. The lowest concentration of SARS-CoV-2 viral copies this assay can detect is 131 copies/mL. A negative result does not preclude SARS-Cov-2 infection and should not be used as the sole basis for treatment or other patient management decisions. A negative result may occur with  improper specimen collection/handling, submission of specimen other than nasopharyngeal swab, presence of viral mutation(s) within the areas targeted by this assay, and inadequate number of viral copies (<131 copies/mL). A negative result must be combined with clinical observations, patient history, and epidemiological information. The expected result is Negative. Fact Sheet for Patients:  PinkCheek.be Fact Sheet for Healthcare Providers:  GravelBags.it This test is not yet ap proved or cleared by the [place] FDA and  has been authorized for detection and/or diagnosis of SARS-CoV-2 by FDA under an Emergency Use Authorization (EUA). This EUA will remain  in effect (meaning this test  can be used) for the duration of the COVID-19 declaration under Section 564(b)(1) of the Act, 21 U.S.C. section 360bbb-3(b)(1), unless the authorization is terminated or revoked sooner.    Influenza A by PCR NEGATIVE NEGATIVE Final   Influenza B by PCR NEGATIVE NEGATIVE Final    Comment: (NOTE) The Xpert Xpress SARS-CoV-2/FLU/RSV assay is intended as an aid in  the diagnosis of influenza from Nasopharyngeal swab specimens and  should not be used as a sole basis for treatment. Nasal washings and  aspirates are unacceptable for Xpert Xpress SARS-CoV-2/FLU/RSV  testing. Fact Sheet for Patients: PinkCheek.be Fact Sheet for Healthcare Providers: GravelBags.it This test is not yet approved or cleared by the [place] FDA and  has been authorized for detection and/or diagnosis of SARS-CoV-2 by  FDA under an Emergency Use Authorization (EUA). This EUA will remain  in effect (meaning this test can be used) for the duration of the  Covid-19 declaration under Section 564(b)(1) of the Act, 21  U.S.C. section 360bbb-3(b)(1), unless the authorization is  terminated or revoked. Performed at [facility], [place] [address], [place], [place] [address]          Radiology Studies: CT Abdomen Pelvis W Contrast  Result Date: 08/23/2019 CLINICAL DATA:  Abdominal distension and constipation. Intermittent  vomiting. Suspected bowel obstruction. EXAM: CT ABDOMEN AND PELVIS WITH CONTRAST TECHNIQUE: Multidetector CT imaging of the abdomen and pelvis was performed using the standard protocol following bolus administration of intravenous contrast. CONTRAST:  17mL OMNIPAQUE IOHEXOL 300 MG/ML  SOLN COMPARISON:  None. FINDINGS: Lower chest: Small dependent left pleural effusion with mild dependent left lower lobe atelectasis. The right lung base is clear. Atherosclerosis of the aorta and coronary arteries. There is a small hiatal hernia  with mild distal esophageal wall thickening. Hepatobiliary: There is a large peripherally enhancing mass involving the right hepatic lobe which measures 10.0 x 7.2 x 11.3 cm. There are additional smaller lesions in the right lobe measuring 14 mm on image 15/2 and 14 mm on image 32/2. No definite lesions in the left lobe. There are no underlying morphologic changes of cirrhosis. No evidence of gallstones, gallbladder wall thickening or biliary dilatation. Pancreas: Unremarkable. No pancreatic ductal dilatation or surrounding inflammatory changes. Spleen: Normal in size without focal abnormality. Adrenals/Urinary Tract: Both adrenal glands appear normal. The kidneys appear normal without evidence of urinary tract calculus, suspicious lesion or hydronephrosis. No bladder abnormalities are seen. Stomach/Bowel: The stomach and proximal small bowel are decompressed. There are several mildly dilated and fluid-filled loops of mid small-bowel with long segment small bowel wall thickening and hyperenhancement in the right lower quadrant. The terminal ileum is decompressed. There is at least moderate diffuse distention of the colon with associated air-fluid levels. There is fairly abrupt change in caliber in the proximal sigmoid colon with suspicion of an infiltrating sigmoid colon mass (axial images 60 through 62 of series 2), suspicious for colon cancer. The rectosigmoid colon is decompressed. Vascular/Lymphatic: There are no enlarged abdominal or pelvic lymph nodes. Diffuse aortic and branch vessel atherosclerosis. No evidence of large vessel occlusion. The portal, superior mesenteric and splenic veins are patent. Reproductive: Hysterectomy. No adnexal mass. Other: Small amount of pelvic ascites. No definite peritoneal nodularity. No evidence of abdominal wall hernia. Musculoskeletal: No acute or significant osseous findings. There are degenerative changes throughout the lumbar spine associated with a convex left scoliosis.  There is sclerosis of both femoral heads, not typical for AVN. No evidence of osseous metastatic disease. IMPRESSION: 1. Suspected infiltrating sigmoid colon mass, highly suspicious for colon cancer, with resulting distal colonic obstruction. 2. Large peripherally enhancing mass involving the right hepatic lobe with additional smaller lesions in the right lobe, most consistent with metastatic colon cancer. There are no morphologic changes of cirrhosis to suggest hepatocellular carcinoma. 3. Long segment wall thickening and hyperenhancement in the distal small bowel, without focal mass lesion, possibly inflammatory. There is resulting mild proximal upstream small-bowel dilatation. 4. Small dependent left pleural effusion with mild dependent left lower lobe atelectasis. Small amount of ascites without definite peritoneal nodularity. 5. Aortic Atherosclerosis (ICD10-I70.0). Electronically Signed   By: Richardean Sale M.D.   On: 08/23/2019 16:45   ECHOCARDIOGRAM COMPLETE  Result Date: 08/24/2019    ECHOCARDIOGRAM REPORT   Patient Name:   SHUNIKA FILLMORE Date of Exam: 08/24/2019 Medical Rec #:  [id number]          Height:       67.0 in Accession #:    [id number]         Weight:       130.0 lb Date of Birth:  1931-05-17          BSA:          1.684 m Patient Age:    86 years  BP:           127/81 mmHg Patient Gender: F                  HR:           90 bpm. Exam Location:  Inpatient Procedure: 2D Echo Indications:    Murmur 785.2 / R01.1  History:        Patient has no prior history of Echocardiogram examinations.                 PAD, Arrythmias:LBBB; Risk Factors:Hypertension. Double BKA,                 uterine cancer.  Sonographer:    Darlina Sicilian RDCS Referring Phys: [id number] [place]  1. Left ventricular ejection fraction, by estimation, is 60 to 65%. The left ventricle has normal function. The left ventricle has no regional wall motion abnormalities. Left ventricular diastolic  parameters are consistent with Grade I diastolic dysfunction (impaired relaxation).  2. Right ventricular systolic function is normal. The right ventricular size is normal.  3. The mitral valve is normal in structure. No evidence of mitral valve regurgitation. No evidence of mitral stenosis.  4. The aortic valve is normal in structure. Aortic valve regurgitation is mild. No aortic stenosis is present. FINDINGS  Left Ventricle: Left ventricular ejection fraction, by estimation, is 60 to 65%. The left ventricle has normal function. The left ventricle has no regional wall motion abnormalities. The left ventricular internal cavity size was normal in size. There is  no left ventricular hypertrophy. Left ventricular diastolic parameters are consistent with Grade I diastolic dysfunction (impaired relaxation). Right Ventricle: The right ventricular size is normal. No increase in right ventricular wall thickness. Right ventricular systolic function is normal. Left Atrium: Left atrial size was normal in size. Right Atrium: Right atrial size was normal in size. Pericardium: There is no evidence of pericardial effusion. Mitral Valve: The mitral valve is normal in structure. No evidence of mitral valve regurgitation. No evidence of mitral valve stenosis. Tricuspid Valve: The tricuspid valve is normal in structure. Tricuspid valve regurgitation is trivial. Aortic Valve: The aortic valve is normal in structure. Aortic valve regurgitation is mild. Aortic regurgitation PHT measures 510 msec. No aortic stenosis is present. Mild aortic valve annular calcification. Pulmonic Valve: The pulmonic valve was normal in structure. Pulmonic valve regurgitation is not visualized. No evidence of pulmonic stenosis. Aorta: The aortic root and ascending aorta are structurally normal, with no evidence of dilitation. IAS/Shunts: The atrial septum is grossly normal.  LEFT VENTRICLE PLAX 2D LVIDd:         4.00 cm     Diastology LVIDs:         3.10 cm      LV e' lateral:   6.42 cm/s LV PW:         1.10 cm     LV E/e' lateral: 9.7 LV IVS:        1.40 cm     LV e' medial:    6.74 cm/s LVOT diam:     1.80 cm     LV E/e' medial:  9.2 LV SV:         74 LV SV Index:   44 LVOT Area:     2.54 cm  LV Volumes (MOD) LV vol d, MOD A2C: 61.5 ml LV vol d, MOD A4C: 81.7 ml LV vol s, MOD A2C: 44.1 ml LV vol s, MOD A4C: 50.0  ml LV SV MOD A2C:     17.4 ml LV SV MOD A4C:     81.7 ml LV SV MOD BP:      22.9 ml RIGHT VENTRICLE RV S prime:     15.90 cm/s TAPSE (M-mode): 1.3 cm LEFT ATRIUM             Index       RIGHT ATRIUM           Index LA diam:        3.60 cm 2.14 cm/m  RA Area:     11.70 cm LA Vol (A2C):   42.6 ml 25.30 ml/m RA Volume:   24.50 ml  14.55 ml/m LA Vol (A4C):   33.9 ml 20.13 ml/m LA Biplane Vol: 39.7 ml 23.58 ml/m  AORTIC VALVE LVOT Vmax:   197.00 cm/s LVOT Vmean:  111.000 cm/s LVOT VTI:    0.290 m AI PHT:      510 msec  AORTA Ao Root diam: 3.00 cm Ao Asc diam:  3.50 cm MITRAL VALVE                TRICUSPID VALVE MV Area (PHT): 2.82 cm     TR Peak grad:   26.0 mmHg MV Decel Time: 269 msec     TR Vmax:        255.00 cm/s MV E velocity: 62.10 cm/s MV A velocity: 113.00 cm/s  SHUNTS MV E/A ratio:  0.55         Systemic VTI:  0.29 m                             Systemic Diam: 1.80 cm Mertie Moores MD Electronically signed by Mertie Moores MD Signature Date/Time: 08/24/2019/3:26:02 PM    Final         Scheduled Meds: . sodium chloride flush  3 mL Intravenous Q12H  . [START ON 08/25/2019] sodium phosphate  1 enema Rectal Once   Continuous Infusions: . lactated ringers 75 mL/hr at 08/24/19 0939     LOS: 1 day    Time spent: 78min  Domenic Polite, MD [facility]  08/24/2019, 4:02 PM

## 2019-08-24 NOTE — ED Notes (Signed)
ED TO INPATIENT HANDOFF REPORT  ED Nurse Name and Phone #: Domingo Dimes [phone]  S Name/Age/Gender [name] 86 y.o. female Room/Bed: WA24/WA24  Code Status   Code Status: DNR  Home/SNF/Other Home Patient oriented to: self, place and time Is this baseline? Yes   Triage Complete: Triage complete  Chief Complaint Large bowel obstruction [facility]) [id number]  Triage Note Patient presents from home with complaints of constipation x 5 days. She has tried multiple time to relieve her self with the use of miralax and other over the counter aids. Nothing has helped.    EMS vitals: 122/76 BP 110 HR 18 RR 95% O2 sat on room air    Allergies No Known Allergies  Level of Care/Admitting Diagnosis ED Disposition    ED Disposition Condition [facility] Area: [place] [id number] Level of Care: Telemetry [5] Admit to tele based on following criteria: Monitor for Ischemic changes May admit patient to [facility] or [facility] if equivalent level of care is available:: Yes  Covid Evaluation: COVID negative Diagnosis: Large bowel [id number] Admitting Physician: Rhetta Mura [id number] Attending Physician: Rhetta Mura [id number] Estimated length of stay: past midnight tomorrow Certification:: I  certify this patient will need inpatient services for at least 2 midnights       B Medical/Surgery History Past Medical History:  Diagnosis Date  . Arthritis   . Cancer ([place])    utertrine  . Hypertension   . Osteoporosis 04/15/2017  . PAD (peripheral artery disease) ([place])    Past Surgical History:  Procedure Laterality Date  . ABDOMINAL HYSTERECTOMY     partial  . AMPUTATION Left 01/29/2015   Procedure: Left  AMPUTATION BELOW KNEE;  Surgeon: Elam Dutch, MD;  Location: [facility];  Service: Vascular;  Laterality: Left;  . AMPUTATION Right 06/17/2017   Procedure: Right  BELOW KNEE Amputation;  Surgeon: Elam Dutch, MD;  Location: [facility];  Service: Vascular;  Laterality: Right;  . KNEE SURGERY Right 1984  . PERIPHERAL VASCULAR CATHETERIZATION N/A 09/15/2014   Procedure: Abdominal Aortogram;  Surgeon: Elam Dutch, MD;  Location: [facility] INVASIVE CV LAB CUPID;  Service: Cardiovascular;  Laterality: N/A;  . PERIPHERAL VASCULAR CATHETERIZATION Bilateral 09/15/2014   Procedure: Lower Extremity Angiography;  Surgeon: Elam Dutch, MD;  Location: [facility] INVASIVE CV LAB CUPID;  Service: Cardiovascular;  Laterality: Bilateral;  . SPINE SURGERY       A IV Location/Drains/Wounds Patient Lines/Drains/Airways Status   Active Line/Drains/Airways    Name:   Placement date:   Placement time:   Site:   Days:   Peripheral IV 08/24/19 Left;Anterior Forearm   08/24/19    0430    Forearm   less than 1   External Urinary Catheter   08/24/19    0445    --   less than 1   Incision (Closed) 01/29/15 Leg Left   01/29/15    1109     1668   Incision (Closed) 06/17/17 Leg Right   06/17/17    1104     798   Wound / Incision (Open or Dehisced) 06/15/17 Other (Comment)   06/15/17    1920    --   800          Intake/Output Last 24 hours  Intake/Output Summary (Last 24 hours) at 08/24/2019 0733 Last data filed at 08/24/2019 0448 Gross per 24 hour  Intake 513.44 ml  Output --  Net  513.44 ml    Labs/Imaging Results for orders placed or performed during the hospital encounter of 08/23/19 (from the past 48 hour(s))  Comprehensive metabolic panel     Status: Abnormal   Collection Time: 08/23/19  2:04 PM  Result Value Ref Range   Sodium 136 135 - 145 mmol/L   Potassium 3.9 3.5 - 5.1 mmol/L   Chloride 101 98 - 111 mmol/L   CO2 25 22 - 32 mmol/L   Glucose, Bld 117 (H) 70 - 99 mg/dL    Comment: Glucose reference range applies only to samples taken after fasting for at least 8 hours.   BUN 26 (H) 8 - 23 mg/dL   Creatinine, Ser 0.45 0.44 - 1.00 mg/dL   Calcium 9.0 8.9 - 10.3 mg/dL   Total Protein 6.5 6.5 - 8.1 g/dL   Albumin  3.3 (L) 3.5 - 5.0 g/dL   AST 21 15 - 41 U/L   ALT 15 0 - 44 U/L   Alkaline Phosphatase 121 38 - 126 U/L   Total Bilirubin 0.5 0.3 - 1.2 mg/dL   GFR calc non Af Amer >60 >60 mL/min   GFR calc Af Amer >60 >60 mL/min   Anion gap 10 5 - 15    Comment: Performed at [facility], [place] [address] [place], [place] [address]  Lipase, blood     Status: None   Collection Time: 08/23/19  2:04 PM  Result Value Ref Range   Lipase 18 11 - 51 U/L    Comment: Performed at [facility], [place] [address] [place], [place] [address]  CBC with Differential     Status: Abnormal   Collection Time: 08/23/19  2:04 PM  Result Value Ref Range   WBC 8.2 4.0 - 10.5 K/uL   RBC 3.98 3.87 - 5.11 MIL/uL   Hemoglobin 11.9 (L) 12.0 - 15.0 g/dL   HCT 36.5 36.0 - 46.0 %   MCV 91.7 80.0 - 100.0 fL   MCH 29.9 26.0 - 34.0 pg   MCHC 32.6 30.0 - 36.0 g/dL   RDW 14.4 11.5 - 15.5 %   Platelets 368 150 - 400 K/uL   nRBC 0.0 0.0 - 0.2 %   Neutrophils Relative % 71 %   Neutro Abs 5.8 1.7 - 7.7 K/uL   Lymphocytes Relative 15 %   Lymphs Abs 1.3 0.7 - 4.0 K/uL   Monocytes Relative 14 %   Monocytes Absolute 1.2 (H) 0.1 - 1.0 K/uL   Eosinophils Relative 0 %   Eosinophils Absolute 0.0 0.0 - 0.5 K/uL   Basophils Relative 0 %   Basophils Absolute 0.0 0.0 - 0.1 K/uL   Immature Granulocytes 0 %   Abs Immature Granulocytes 0.03 0.00 - 0.07 K/uL    Comment: Performed at [facility], [place] [address], [place], [place] [address]  Urinalysis, Routine w reflex microscopic     Status: Abnormal   Collection Time: 08/23/19  2:19 PM  Result Value Ref Range   Color, Urine YELLOW YELLOW   APPearance HAZY (A) CLEAR   Specific Gravity, Urine 1.023 1.005 - 1.030   pH 5.0 5.0 - 8.0   Glucose, UA NEGATIVE NEGATIVE mg/dL   Hgb urine dipstick NEGATIVE NEGATIVE   Bilirubin Urine NEGATIVE NEGATIVE   Ketones, ur 5 (A) NEGATIVE mg/dL   Protein, ur NEGATIVE NEGATIVE mg/dL   Nitrite NEGATIVE  NEGATIVE   Leukocytes,Ua SMALL (A) NEGATIVE   RBC / HPF 0-5 0 - 5  RBC/hpf   WBC, UA 11-20 0 - 5 WBC/hpf   Bacteria, UA RARE (A) NONE SEEN   Squamous Epithelial / LPF 6-10 0 - 5   Mucus PRESENT    Hyaline Casts, UA PRESENT     Comment: Performed at [facility], [place] [address], [place], [place] [address]  Respiratory Panel by RT PCR (Flu A&B, Covid) - Nasopharyngeal Swab     Status: None   Collection Time: 08/23/19  6:35 PM   Specimen: Nasopharyngeal Swab  Result Value Ref Range   SARS Coronavirus 2 by RT PCR NEGATIVE NEGATIVE    Comment: (NOTE) SARS-CoV-2 target nucleic acids are NOT DETECTED. The SARS-CoV-2 RNA is generally detectable in upper respiratoy specimens during the acute phase of infection. The lowest concentration of SARS-CoV-2 viral copies this assay can detect is 131 copies/mL. A negative result does not preclude SARS-Cov-2 infection and should not be used as the sole basis for treatment or other patient management decisions. A negative result may occur with  improper specimen collection/handling, submission of specimen other than nasopharyngeal swab, presence of viral mutation(s) within the areas targeted by this assay, and inadequate number of viral copies (<131 copies/mL). A negative result must be combined with clinical observations, patient history, and epidemiological information. The expected result is Negative. Fact Sheet for Patients:  PinkCheek.be Fact Sheet for Healthcare Providers:  GravelBags.it This test is not yet ap proved or cleared by the [place] FDA and  has been authorized for detection and/or diagnosis of SARS-CoV-2 by FDA under an Emergency Use Authorization (EUA). This EUA will remain  in effect (meaning this test can be used) for the duration of the COVID-19 declaration under Section 564(b)(1) of the Act, 21 U.S.C. section 360bbb-3(b)(1), unless the  authorization is terminated or revoked sooner.    Influenza A by PCR NEGATIVE NEGATIVE   Influenza B by PCR NEGATIVE NEGATIVE    Comment: (NOTE) The Xpert Xpress SARS-CoV-2/FLU/RSV assay is intended as an aid in  the diagnosis of influenza from Nasopharyngeal swab specimens and  should not be used as a sole basis for treatment. Nasal washings and  aspirates are unacceptable for Xpert Xpress SARS-CoV-2/FLU/RSV  testing. Fact Sheet for Patients: PinkCheek.be Fact Sheet for Healthcare Providers: GravelBags.it This test is not yet approved or cleared by the [place] FDA and  has been authorized for detection and/or diagnosis of SARS-CoV-2 by  FDA under an Emergency Use Authorization (EUA). This EUA will remain  in effect (meaning this test can be used) for the duration of the  Covid-19 declaration under Section 564(b)(1) of the Act, 21  U.S.C. section 360bbb-3(b)(1), unless the authorization is  terminated or revoked. Performed at [facility], [place] [address] [place], [place] [address]   Magnesium     Status: None   Collection Time: 08/24/19  4:50 AM  Result Value Ref Range   Magnesium 2.2 1.7 - 2.4 mg/dL    Comment: Performed at [facility], [place] [address], [place], [place] [address]  Comprehensive metabolic panel     Status: Abnormal   Collection Time: 08/24/19  4:50 AM  Result Value Ref Range   Sodium 136 135 - 145 mmol/L   Potassium 3.7 3.5 - 5.1 mmol/L   Chloride 102 98 - 111 mmol/L   CO2 21 (L) 22 - 32 mmol/L   Glucose, Bld 88 70 - 99 mg/dL    Comment: Glucose reference range applies only to samples taken after fasting for  at least 8 hours.   BUN 25 (H) 8 - 23 mg/dL   Creatinine, Ser 0.43 (L) 0.44 - 1.00 mg/dL   Calcium 8.7 (L) 8.9 - 10.3 mg/dL   Total Protein 6.4 (L) 6.5 - 8.1 g/dL   Albumin 3.4 (L) 3.5 - 5.0 g/dL   AST 26 15 - 41 U/L   ALT 16 0 - 44 U/L   Alkaline  Phosphatase 114 38 - 126 U/L   Total Bilirubin 0.7 0.3 - 1.2 mg/dL   GFR calc non Af Amer >60 >60 mL/min   GFR calc Af Amer >60 >60 mL/min   Anion gap 13 5 - 15    Comment: Performed at [facility], [place] [address] [place], [place] [address]  Protime-INR     Status: None   Collection Time: 08/24/19  4:50 AM  Result Value Ref Range   Prothrombin Time 14.3 11.4 - 15.2 seconds   INR 1.1 0.8 - 1.2    Comment: (NOTE) INR goal varies based on device and disease states. Performed at [facility], [place] [address] [place], [place] [address]   CBC     Status: Abnormal   Collection Time: 08/24/19  4:50 AM  Result Value Ref Range   WBC 7.7 4.0 - 10.5 K/uL   RBC 3.98 3.87 - 5.11 MIL/uL   Hemoglobin 11.7 (L) 12.0 - 15.0 g/dL   HCT 36.4 36.0 - 46.0 %   MCV 91.5 80.0 - 100.0 fL   MCH 29.4 26.0 - 34.0 pg   MCHC 32.1 30.0 - 36.0 g/dL   RDW 14.6 11.5 - 15.5 %   Platelets 377 150 - 400 K/uL   nRBC 0.0 0.0 - 0.2 %    Comment: Performed at [facility], [place] [address], [place], [place] [address]   CT Abdomen Pelvis W Contrast  Result Date: 08/23/2019 CLINICAL DATA:  Abdominal distension and constipation. Intermittent vomiting. Suspected bowel obstruction. EXAM: CT ABDOMEN AND PELVIS WITH CONTRAST TECHNIQUE: Multidetector CT imaging of the abdomen and pelvis was performed using the standard protocol following bolus administration of intravenous contrast. CONTRAST:  119mL OMNIPAQUE IOHEXOL 300 MG/ML  SOLN COMPARISON:  None. FINDINGS: Lower chest: Small dependent left pleural effusion with mild dependent left lower lobe atelectasis. The right lung base is clear. Atherosclerosis of the aorta and coronary arteries. There is a small hiatal hernia with mild distal esophageal wall thickening. Hepatobiliary: There is a large peripherally enhancing mass involving the right hepatic lobe which measures 10.0 x 7.2 x 11.3 cm. There are additional smaller lesions  in the right lobe measuring 14 mm on image 15/2 and 14 mm on image 32/2. No definite lesions in the left lobe. There are no underlying morphologic changes of cirrhosis. No evidence of gallstones, gallbladder wall thickening or biliary dilatation. Pancreas: Unremarkable. No pancreatic ductal dilatation or surrounding inflammatory changes. Spleen: Normal in size without focal abnormality. Adrenals/Urinary Tract: Both adrenal glands appear normal. The kidneys appear normal without evidence of urinary tract calculus, suspicious lesion or hydronephrosis. No bladder abnormalities are seen. Stomach/Bowel: The stomach and proximal small bowel are decompressed. There are several mildly dilated and fluid-filled loops of mid small-bowel with long segment small bowel wall thickening and hyperenhancement in the right lower quadrant. The terminal ileum is decompressed. There is at least moderate diffuse distention of the colon with associated air-fluid levels. There is fairly abrupt change in caliber in the proximal sigmoid colon with suspicion of an infiltrating sigmoid colon  mass (axial images 60 through 62 of series 2), suspicious for colon cancer. The rectosigmoid colon is decompressed. Vascular/Lymphatic: There are no enlarged abdominal or pelvic lymph nodes. Diffuse aortic and branch vessel atherosclerosis. No evidence of large vessel occlusion. The portal, superior mesenteric and splenic veins are patent. Reproductive: Hysterectomy. No adnexal mass. Other: Small amount of pelvic ascites. No definite peritoneal nodularity. No evidence of abdominal wall hernia. Musculoskeletal: No acute or significant osseous findings. There are degenerative changes throughout the lumbar spine associated with a convex left scoliosis. There is sclerosis of both femoral heads, not typical for AVN. No evidence of osseous metastatic disease. IMPRESSION: 1. Suspected infiltrating sigmoid colon mass, highly suspicious for colon cancer, with  resulting distal colonic obstruction. 2. Large peripherally enhancing mass involving the right hepatic lobe with additional smaller lesions in the right lobe, most consistent with metastatic colon cancer. There are no morphologic changes of cirrhosis to suggest hepatocellular carcinoma. 3. Long segment wall thickening and hyperenhancement in the distal small bowel, without focal mass lesion, possibly inflammatory. There is resulting mild proximal upstream small-bowel dilatation. 4. Small dependent left pleural effusion with mild dependent left lower lobe atelectasis. Small amount of ascites without definite peritoneal nodularity. 5. Aortic Atherosclerosis (ICD10-I70.0). Electronically Signed   By: Richardean Sale M.D.   On: 08/23/2019 16:45    Pending Labs Unresulted Labs (From admission, onward)    Start     Ordered   08/23/19 2031  Magnesium  Add-on,   AD     08/23/19 2030          Vitals/Pain Today's Vitals   08/24/19 0400 08/24/19 0500 08/24/19 0530 08/24/19 0630  BP: (!) 146/98 (!) 90/53 110/69 (!) 151/79  Pulse: 90 78 76 79  Resp: 17 18 16 17   Temp:      TempSrc:      SpO2: 96% 96% 98% 98%  PainSc:        Isolation Precautions No active isolations  Medications Medications  sodium chloride flush (NS) 0.9 % injection 3 mL (has no administration in time range)  lactated ringers infusion ( Intravenous Rate/Dose Verify 08/24/19 0448)  ondansetron (ZOFRAN) injection 4 mg (has no administration in time range)  morphine 2 MG/ML injection 2 mg (has no administration in time range)  iohexol (OMNIPAQUE) 300 MG/ML solution 100 mL (100 mLs Intravenous Contrast Given 08/23/19 1548)    Mobility walks with device Low fall risk   Focused Assessments    R Recommendations: See Admitting Provider Note  Report given to:   Additional Notes:  Patient is a bilateral amputee

## 2019-08-25 ENCOUNTER — Encounter (HOSPITAL_COMMUNITY)
Admission: EM | Disposition: A | Discharge status: Skilled Nursing Facility | Payer: Self-pay | Source: Home / Self Care | Attending: Internal Medicine

## 2019-08-25 ENCOUNTER — Inpatient Hospital Stay (HOSPITAL_COMMUNITY): Payer: Medicare Other | Admitting: Anesthesiology

## 2019-08-25 ENCOUNTER — Encounter (HOSPITAL_COMMUNITY): Payer: Self-pay | Admitting: Internal Medicine

## 2019-08-25 HISTORY — PX: SUBMUCOSAL INJECTION: SHX5543

## 2019-08-25 HISTORY — PX: BIOPSY: SHX5522

## 2019-08-25 HISTORY — PX: FLEXIBLE SIGMOIDOSCOPY: SHX5431

## 2019-08-25 LAB — CBC
HCT: 33.6 % — ABNORMAL LOW (ref 36.0–46.0)
Hemoglobin: 10.9 g/dL — ABNORMAL LOW (ref 12.0–15.0)
MCH: 29.6 pg (ref 26.0–34.0)
MCHC: 32.4 g/dL (ref 30.0–36.0)
MCV: 91.3 fL (ref 80.0–100.0)
Platelets: 300 10*3/uL (ref 150–400)
RBC: 3.68 MIL/uL — ABNORMAL LOW (ref 3.87–5.11)
RDW: 14.7 % (ref 11.5–15.5)
WBC: 5.6 10*3/uL (ref 4.0–10.5)
nRBC: 0 % (ref 0.0–0.2)

## 2019-08-25 LAB — COMPREHENSIVE METABOLIC PANEL
ALT: 16 U/L (ref 0–44)
AST: 27 U/L (ref 15–41)
Albumin: 2.8 g/dL — ABNORMAL LOW (ref 3.5–5.0)
Alkaline Phosphatase: 94 U/L (ref 38–126)
Anion gap: 16 — ABNORMAL HIGH (ref 5–15)
BUN: 21 mg/dL (ref 8–23)
CO2: 20 mmol/L — ABNORMAL LOW (ref 22–32)
Calcium: 8.4 mg/dL — ABNORMAL LOW (ref 8.9–10.3)
Chloride: 103 mmol/L (ref 98–111)
Creatinine, Ser: 0.47 mg/dL (ref 0.44–1.00)
GFR calc Af Amer: >60 mL/min (ref >60)
GFR calc non Af Amer: >60 mL/min (ref >60)
Glucose, Bld: 67 mg/dL — ABNORMAL LOW (ref 70–99)
Potassium: 3.6 mmol/L (ref 3.5–5.1)
Sodium: 139 mmol/L (ref 135–145)
Total Bilirubin: 1 mg/dL (ref 0.3–1.2)
Total Protein: 5.5 g/dL — ABNORMAL LOW (ref 6.5–8.1)

## 2019-08-25 LAB — CEA: CEA: 89.4 ng/mL — ABNORMAL HIGH (ref 0.0–4.7)

## 2019-08-25 SURGERY — SIGMOIDOSCOPY, FLEXIBLE
Anesthesia: Monitor Anesthesia Care

## 2019-08-25 MED ORDER — SPOT INK MARKER SYRINGE KIT
PACK | SUBMUCOSAL | Status: DC | PRN
  Administered 2019-08-25: 1 mL via SUBMUCOSAL

## 2019-08-25 MED ORDER — EPHEDRINE SULFATE-NACL 50-0.9 MG/10ML-% IV SOSY
PREFILLED_SYRINGE | INTRAVENOUS | Status: DC | PRN
  Administered 2019-08-25: 10 mg via INTRAVENOUS

## 2019-08-25 MED ORDER — SPOT INK MARKER SYRINGE KIT
PACK | SUBMUCOSAL | Status: AC
  Filled 2019-08-25: qty 5

## 2019-08-25 MED ORDER — PROPOFOL 500 MG/50ML IV EMUL
INTRAVENOUS | Status: DC | PRN
  Administered 2019-08-25: 125 ug/kg/min via INTRAVENOUS

## 2019-08-25 MED ORDER — SODIUM CHLORIDE 0.9 % IV SOLN: INTRAVENOUS | Status: DC

## 2019-08-25 MED ORDER — PHENYLEPHRINE HCL (PRESSORS) 10 MG/ML IV SOLN
INTRAVENOUS | Status: DC | PRN
  Administered 2019-08-25 (×2): 80 ug via INTRAVENOUS
  Administered 2019-08-25: 120 ug via INTRAVENOUS
  Administered 2019-08-25: 80 ug via INTRAVENOUS

## 2019-08-25 MED ORDER — POLYETHYLENE GLYCOL 3350 17 G PO PACK
17.0000 g | PACK | Freq: Every day | ORAL | Status: DC
  Administered 2019-08-25 – 2019-08-26 (×2): 17 g via ORAL
  Filled 2019-08-25 (×2): qty 1

## 2019-08-25 MED ORDER — LACTATED RINGERS IV SOLN
INTRAVENOUS | Status: DC
  Administered 2019-08-25: 1000 mL via INTRAVENOUS

## 2019-08-25 NOTE — Progress Notes (Signed)
PROGRESS NOTE    [name]  [id number] DOB: 05-18-1931 DOA: 08/23/2019 PCP: [name], MD  Brief Narrative:[name] has history of PAD s/p B/L BKA for which she takes Plavix daily. Followed with Dr. Oneida Alar. She was admitted 4/13 with large bowel obstruction. CT showed sigmoid colon mass with distal colonic obstruction with metastases to the liver. Surgery following.  GI consulted    Assessment & Plan:   Sigmoid colon mass with liver mets Distal colonic obstruction -GI and general surgery consulting, continue n.p.o. IV fluids and supportive care -Flex sig today with malignant partially obstructing tumor in the sigmoid colon, biopsied -General surgery following, anticipate will likely need by partial colectomy this admission  Asymptomatic pyuria -No indication for antibiotics no symptoms, monitor at this time  Dehydration -Continue IV fluids today  History of PAD -Bilateral BKA -Hold Plavix  Essential hypertension -Lisinopril on hold  GERD -Continue PPI  DVT prophylaxis: SCDs  code Status: DNR Family Communication: No family at bedside will update grandson Disposition Plan: Patient is from home, plan for discharge home pending work-up and treatment of large obstructing colon mass    Consultants:   General surgery  [facility]   Procedures:   Antimicrobials:    Subjective: -Reports feeling hungry, mild abdominal discomfort  Objective: Vitals:   08/25/19 1109 08/25/19 1244 08/25/19 1254 08/25/19 1300  BP: (!) 145/68 (!) 91/49 116/62 139/77  Pulse: 80 75 74 74  Resp: 17 15 15  (!) 21  Temp: 98.4 F (36.9 C) (!) 97.3 F (36.3 C)    TempSrc: Oral Axillary    SpO2: 95% 97% 96% 97%    Intake/Output Summary (Last 24 hours) at 08/25/2019 1318 Last data filed at 08/25/2019 1247 Gross per 24 hour  Intake 1221.45 ml  Output 500 ml  Net 721.45 ml   There were no vitals filed for this visit.  Examination:  Gen: Elderly  pleasant thinly built female sitting up in bed, AAOx3, no distress HEENT: no JVD Lungs: Decreased breath sounds both bases CVS: S1-S2, regular rate rhythm Abd: Soft, mildly distended, nontender, bowel sounds present Extremities: No edema, bilateral BKA Skin: no new rashes Psychiatry: Judgement and insight appear normal. Mood & affect appropriate.     Data Reviewed:   CBC: Recent Labs  Lab 08/23/19 1404 08/24/19 0450 08/25/19 0558  WBC 8.2 7.7 5.6  NEUTROABS 5.8  --   --   HGB 11.9* 11.7* 10.9*  HCT 36.5 36.4 33.6*  MCV 91.7 91.5 91.3  PLT 368 377 [id number]   Basic Metabolic Panel: Recent Labs  Lab 08/23/19 1404 08/24/19 0450 08/25/19 0558  NA 136 136 139  K 3.9 3.7 3.6  CL 101 102 103  CO2 25 21* 20*  GLUCOSE 117* 88 67*  BUN 26* 25* 21  CREATININE 0.45 0.43* 0.47  CALCIUM 9.0 8.7* 8.4*  MG  --  2.2  --    GFR: CrCl cannot be calculated (Unknown ideal weight.). Liver Function Tests: Recent Labs  Lab 08/23/19 1404 08/24/19 0450 08/25/19 0558  AST 21 26 27   ALT 15 16 16   ALKPHOS 121 114 94  BILITOT 0.5 0.7 1.0  PROT 6.5 6.4* 5.5*  ALBUMIN 3.3* 3.4* 2.8*   Recent Labs  Lab 08/23/19 1404  LIPASE 18   No results for input(s): AMMONIA in the last 168 hours. Coagulation Profile: Recent Labs  Lab 08/24/19 0450  INR 1.1   Cardiac Enzymes: No results for input(s): CKTOTAL, CKMB, CKMBINDEX, TROPONINI in the last  168 hours. BNP (last 3 results) No results for input(s): PROBNP in the last 8760 hours. HbA1C: No results for input(s): HGBA1C in the last 72 hours. CBG: No results for input(s): GLUCAP in the last 168 hours. Lipid Profile: No results for input(s): CHOL, HDL, LDLCALC, TRIG, CHOLHDL, LDLDIRECT in the last 72 hours. Thyroid Function Tests: No results for input(s): TSH, T4TOTAL, FREET4, T3FREE, THYROIDAB in the last 72 hours. Anemia Panel: No results for input(s): VITAMINB12, FOLATE, FERRITIN, TIBC, IRON, RETICCTPCT in the last 72 hours. Urine  analysis:    Component Value Date/Time   COLORURINE YELLOW 08/23/2019 1419   APPEARANCEUR HAZY (A) 08/23/2019 1419   LABSPEC 1.023 08/23/2019 1419   PHURINE 5.0 08/23/2019 1419   GLUCOSEU NEGATIVE 08/23/2019 1419   HGBUR NEGATIVE 08/23/2019 1419   BILIRUBINUR NEGATIVE 08/23/2019 1419   KETONESUR 5 (A) 08/23/2019 1419   PROTEINUR NEGATIVE 08/23/2019 1419   NITRITE NEGATIVE 08/23/2019 1419   LEUKOCYTESUR SMALL (A) 08/23/2019 1419   Sepsis Labs: @LABRCNTIP (procalcitonin:4,lacticidven:4)  ) Recent Results (from the past 240 hour(s))  Respiratory Panel by RT PCR (Flu A&B, Covid) - Nasopharyngeal Swab     Status: None   Collection Time: 08/23/19  6:35 PM   Specimen: Nasopharyngeal Swab  Result Value Ref Range Status   SARS Coronavirus 2 by RT PCR NEGATIVE NEGATIVE Final    Comment: (NOTE) SARS-CoV-2 target nucleic acids are NOT DETECTED. The SARS-CoV-2 RNA is generally detectable in upper respiratoy specimens during the acute phase of infection. The lowest concentration of SARS-CoV-2 viral copies this assay can detect is 131 copies/mL. A negative result does not preclude SARS-Cov-2 infection and should not be used as the sole basis for treatment or other patient management decisions. A negative result may occur with  improper specimen collection/handling, submission of specimen other than nasopharyngeal swab, presence of viral mutation(s) within the areas targeted by this assay, and inadequate number of viral copies (<131 copies/mL). A negative result must be combined with clinical observations, patient history, and epidemiological information. The expected result is Negative. Fact Sheet for Patients:  PinkCheek.be Fact Sheet for Healthcare Providers:  GravelBags.it This test is not yet ap proved or cleared by the [place] FDA and  has been authorized for detection and/or diagnosis of SARS-CoV-2 by FDA under an  Emergency Use Authorization (EUA). This EUA will remain  in effect (meaning this test can be used) for the duration of the COVID-19 declaration under Section 564(b)(1) of the Act, 21 U.S.C. section 360bbb-3(b)(1), unless the authorization is terminated or revoked sooner.    Influenza A by PCR NEGATIVE NEGATIVE Final   Influenza B by PCR NEGATIVE NEGATIVE Final    Comment: (NOTE) The Xpert Xpress SARS-CoV-2/FLU/RSV assay is intended as an aid in  the diagnosis of influenza from Nasopharyngeal swab specimens and  should not be used as a sole basis for treatment. Nasal washings and  aspirates are unacceptable for Xpert Xpress SARS-CoV-2/FLU/RSV  testing. Fact Sheet for Patients: PinkCheek.be Fact Sheet for Healthcare Providers: GravelBags.it This test is not yet approved or cleared by the [place] FDA and  has been authorized for detection and/or diagnosis of SARS-CoV-2 by  FDA under an Emergency Use Authorization (EUA). This EUA will remain  in effect (meaning this test can be used) for the duration of the  Covid-19 declaration under Section 564(b)(1) of the Act, 21  U.S.C. section 360bbb-3(b)(1), unless the authorization is  terminated or revoked. Performed at [facility], [address] [place], [name]  [address]          Radiology Studies: CT Abdomen Pelvis W Contrast  Result Date: 08/23/2019 CLINICAL DATA:  Abdominal distension and constipation. Intermittent vomiting. Suspected bowel obstruction. EXAM: CT ABDOMEN AND PELVIS WITH CONTRAST TECHNIQUE: Multidetector CT imaging of the abdomen and pelvis was performed using the standard protocol following bolus administration of intravenous contrast. CONTRAST:  146mL OMNIPAQUE IOHEXOL 300 MG/ML  SOLN COMPARISON:  None. FINDINGS: Lower chest: Small dependent left pleural effusion with mild dependent left lower lobe atelectasis. The right lung base is  clear. Atherosclerosis of the aorta and coronary arteries. There is a small hiatal hernia with mild distal esophageal wall thickening. Hepatobiliary: There is a large peripherally enhancing mass involving the right hepatic lobe which measures 10.0 x 7.2 x 11.3 cm. There are additional smaller lesions in the right lobe measuring 14 mm on image 15/2 and 14 mm on image 32/2. No definite lesions in the left lobe. There are no underlying morphologic changes of cirrhosis. No evidence of gallstones, gallbladder wall thickening or biliary dilatation. Pancreas: Unremarkable. No pancreatic ductal dilatation or surrounding inflammatory changes. Spleen: Normal in size without focal abnormality. Adrenals/Urinary Tract: Both adrenal glands appear normal. The kidneys appear normal without evidence of urinary tract calculus, suspicious lesion or hydronephrosis. No bladder abnormalities are seen. Stomach/Bowel: The stomach and proximal small bowel are decompressed. There are several mildly dilated and fluid-filled loops of mid small-bowel with long segment small bowel wall thickening and hyperenhancement in the right lower quadrant. The terminal ileum is decompressed. There is at least moderate diffuse distention of the colon with associated air-fluid levels. There is fairly abrupt change in caliber in the proximal sigmoid colon with suspicion of an infiltrating sigmoid colon mass (axial images 60 through 62 of series 2), suspicious for colon cancer. The rectosigmoid colon is decompressed. Vascular/Lymphatic: There are no enlarged abdominal or pelvic lymph nodes. Diffuse aortic and branch vessel atherosclerosis. No evidence of large vessel occlusion. The portal, superior mesenteric and splenic veins are patent. Reproductive: Hysterectomy. No adnexal mass. Other: Small amount of pelvic ascites. No definite peritoneal nodularity. No evidence of abdominal wall hernia. Musculoskeletal: No acute or significant osseous findings. There are  degenerative changes throughout the lumbar spine associated with a convex left scoliosis. There is sclerosis of both femoral heads, not typical for AVN. No evidence of osseous metastatic disease. IMPRESSION: 1. Suspected infiltrating sigmoid colon mass, highly suspicious for colon cancer, with resulting distal colonic obstruction. 2. Large peripherally enhancing mass involving the right hepatic lobe with additional smaller lesions in the right lobe, most consistent with metastatic colon cancer. There are no morphologic changes of cirrhosis to suggest hepatocellular carcinoma. 3. Long segment wall thickening and hyperenhancement in the distal small bowel, without focal mass lesion, possibly inflammatory. There is resulting mild proximal upstream small-bowel dilatation. 4. Small dependent left pleural effusion with mild dependent left lower lobe atelectasis. Small amount of ascites without definite peritoneal nodularity. 5. Aortic Atherosclerosis (ICD10-I70.0). Electronically Signed   By: Richardean Sale M.D.   On: 08/23/2019 16:45   DG INTRO LONG GI TUBE  Result Date: 08/25/2019 CLINICAL DATA:  Abdominal distension, nutritional needs EXAM: FL FEEDING TUBE PLACEMENT FLUOROSCOPY TIME:  Fluoroscopy Time:  1 minutes, 12 seconds Radiation Exposure Index (if provided by the fluoroscopic device): 7.9 mGy Number of Acquired Spot Images: 0 COMPARISON:  None. FINDINGS: Local anesthetic was applied to the right nostril. A nasogastric tube was advanced without difficulty from the right nostril into the stomach under fluoroscopic  observation. The tube was taped in place using a dedicated nasal tape apparatus with clamp. The nasogastric tube is ready to use. IMPRESSION: 1. Successful fluoroscopically guided nasogastric tube placement. Electronically Signed   By: Van Clines M.D.   On: 08/25/2019 08:32   ECHOCARDIOGRAM COMPLETE  Result Date: 08/24/2019    ECHOCARDIOGRAM REPORT   Patient Name:   AUDRIS DELZELL  Date of Exam: 08/24/2019 Medical Rec #:  [id number]          Height:       67.0 in Accession #:    [id number]         Weight:       130.0 lb Date of Birth:  March 01, 1932          BSA:          1.684 m Patient Age:    43 years           BP:           127/81 mmHg Patient Gender: F                  HR:           90 bpm. Exam Location:  Inpatient Procedure: 2D Echo Indications:    Murmur 785.2 / R01.1  History:        Patient has no prior history of Echocardiogram examinations.                 PAD, Arrythmias:LBBB; Risk Factors:Hypertension. Double BKA,                 uterine cancer.  Sonographer:    Darlina Sicilian RDCS Referring Phys: [id number] Ashley  1. Left ventricular ejection fraction, by estimation, is 60 to 65%. The left ventricle has normal function. The left ventricle has no regional wall motion abnormalities. Left ventricular diastolic parameters are consistent with Grade I diastolic dysfunction (impaired relaxation).  2. Right ventricular systolic function is normal. The right ventricular size is normal.  3. The mitral valve is normal in structure. No evidence of mitral valve regurgitation. No evidence of mitral stenosis.  4. The aortic valve is normal in structure. Aortic valve regurgitation is mild. No aortic stenosis is present. FINDINGS  Left Ventricle: Left ventricular ejection fraction, by estimation, is 60 to 65%. The left ventricle has normal function. The left ventricle has no regional wall motion abnormalities. The left ventricular internal cavity size was normal in size. There is  no left ventricular hypertrophy. Left ventricular diastolic parameters are consistent with Grade I diastolic dysfunction (impaired relaxation). Right Ventricle: The right ventricular size is normal. No increase in right ventricular wall thickness. Right ventricular systolic function is normal. Left Atrium: Left atrial size was normal in size. Right Atrium: Right atrial size was normal in size.  Pericardium: There is no evidence of pericardial effusion. Mitral Valve: The mitral valve is normal in structure. No evidence of mitral valve regurgitation. No evidence of mitral valve stenosis. Tricuspid Valve: The tricuspid valve is normal in structure. Tricuspid valve regurgitation is trivial. Aortic Valve: The aortic valve is normal in structure. Aortic valve regurgitation is mild. Aortic regurgitation PHT measures 510 msec. No aortic stenosis is present. Mild aortic valve annular calcification. Pulmonic Valve: The pulmonic valve was normal in structure. Pulmonic valve regurgitation is not visualized. No evidence of pulmonic stenosis. Aorta: The aortic root and ascending aorta are structurally normal, with no evidence of dilitation. IAS/Shunts: The atrial septum is grossly normal.  LEFT VENTRICLE PLAX 2D LVIDd:         4.00 cm     Diastology LVIDs:         3.10 cm     LV e' lateral:   6.42 cm/s LV PW:         1.10 cm     LV E/e' lateral: 9.7 LV IVS:        1.40 cm     LV e' medial:    6.74 cm/s LVOT diam:     1.80 cm     LV E/e' medial:  9.2 LV SV:         74 LV SV Index:   44 LVOT Area:     2.54 cm  LV Volumes (MOD) LV vol d, MOD A2C: 61.5 ml LV vol d, MOD A4C: 81.7 ml LV vol s, MOD A2C: 44.1 ml LV vol s, MOD A4C: 50.0 ml LV SV MOD A2C:     17.4 ml LV SV MOD A4C:     81.7 ml LV SV MOD BP:      22.9 ml RIGHT VENTRICLE RV S prime:     15.90 cm/s TAPSE (M-mode): 1.3 cm LEFT ATRIUM             Index       RIGHT ATRIUM           Index LA diam:        3.60 cm 2.14 cm/m  RA Area:     11.70 cm LA Vol (A2C):   42.6 ml 25.30 ml/m RA Volume:   24.50 ml  14.55 ml/m LA Vol (A4C):   33.9 ml 20.13 ml/m LA Biplane Vol: 39.7 ml 23.58 ml/m  AORTIC VALVE LVOT Vmax:   197.00 cm/s LVOT Vmean:  111.000 cm/s LVOT VTI:    0.290 m AI PHT:      510 msec  AORTA Ao Root diam: 3.00 cm Ao Asc diam:  3.50 cm MITRAL VALVE                TRICUSPID VALVE MV Area (PHT): 2.82 cm     TR Peak grad:   26.0 mmHg MV Decel Time: 269 msec     TR  Vmax:        255.00 cm/s MV E velocity: 62.10 cm/s MV A velocity: 113.00 cm/s  SHUNTS MV E/A ratio:  0.55         Systemic VTI:  0.29 m                             Systemic Diam: 1.80 cm Mertie Moores MD Electronically signed by Mertie Moores MD Signature Date/Time: 08/24/2019/3:26:02 PM    Final      Scheduled Meds: . sodium chloride flush  3 mL Intravenous Q12H   Continuous Infusions: . lactated ringers 75 mL/hr at 08/24/19 2352     LOS: 2 days    Time spent: 53min  Domenic Polite, MD [facility]  08/25/2019, 1:18 PM

## 2019-08-25 NOTE — Progress Notes (Signed)
Echo reviewed. Normal LVEF without wall motion abnormalities. No significant valvular heart disease. Cardiology will sign off. Please call with questions.   [place] T. Audie Box, [place]  [address], [place], [place] [address] [phone]  10:20 AM

## 2019-08-25 NOTE — H&P (Signed)
The patient is here for a flexible sigmoidoscopy to evaluate a sigmoid lesion.  No distress  Heart regular rhythm  Lungs clear  Abdomen soft nontender  Impression: Sigmoid lesion  Plan flexible sigmoidoscopy

## 2019-08-25 NOTE — Progress Notes (Signed)
    CC: Constipation/abdominal distention  Subjective: Patient pulled her NG tube out last evening.  She says her abdomen feels less distended this AM.  She has had her a.m. fleets enema prior to her sigmoidoscopy.  She seems fairly comfortable.  Objective: Vital signs in last 24 hours: Temp:  [97.6 F (36.4 C)-98.3 F (36.8 C)] 97.6 F (36.4 C) (04/15 0651) Pulse Rate:  [78-92] 79 (04/15 0651) Resp:  [15-20] 16 (04/15 0651) BP: (117-133)/(72-81) 133/78 (04/15 0651) SpO2:  [96 %-98 %] 97 % (04/15 0651) Last BM Date: 08/18/19 600 IV recorded 800 urine recorded No other intake output recorded Afebrile vital signs are stable CMP remained stable and normal. CEA 89.4   Intake/Output from previous day: 04/14 0701 - 04/15 0700 In: 600 [I.V.:600] Out: 800 [Urine:800] Intake/Output this shift: No intake/output data recorded.  General appearance: alert, cooperative and no distress Resp: clear to auscultation bilaterally GI: Abdomen is distended positive bowel sounds no BM.  Lab Results:  Recent Labs    08/24/19 0450 08/25/19 0558  WBC 7.7 5.6  HGB 11.7* 10.9*  HCT 36.4 33.6*  PLT 377 300    BMET Recent Labs    08/24/19 0450 08/25/19 0558  NA 136 139  K 3.7 3.6  CL 102 103  CO2 21* 20*  GLUCOSE 88 67*  BUN 25* 21  CREATININE 0.43* 0.47  CALCIUM 8.7* 8.4*   PT/INR Recent Labs    08/24/19 0450  LABPROT 14.3  INR 1.1    Recent Labs  Lab 08/23/19 1404 08/24/19 0450 08/25/19 0558  AST 21 26 27   ALT 15 16 16   ALKPHOS 121 114 94  BILITOT 0.5 0.7 1.0  PROT 6.5 6.4* 5.5*  ALBUMIN 3.3* 3.4* 2.8*     Lipase     Component Value Date/Time   LIPASE 18 08/23/2019 1404     Medications: . sodium chloride flush  3 mL Intravenous Q12H   . lactated ringers 75 mL/hr at 08/24/19 2352    Assessment/Plan PVD with left and right BKA's  - She does now have bilateral prosthesis/and does walk  - Plavix -LD 08/23/19 Hypertension Hx of uterine cancer with  hysterctomy Malnutrition -prealbumin pending .  Constipation/abdominal distension Sigmoid colon obstruction, small bowel with thickening and liver mass Left pleural effusion/LLL atelectasis  - CEA 89.4   FEN:  IV fluids/NPO ID:  None DVT:  SCD's; she can have DVT prophylaxis from our standpoint Follow up: TBD   Plan: Flexible sigmoidoscopy today.  She is not having any nausea so we will leave the NG out.  Add prealbumin to a.m. labs.  LOS: 2 days    Etherine Mackowiak 08/25/2019 Please see Amion

## 2019-08-25 NOTE — Anesthesia Preprocedure Evaluation (Signed)
Anesthesia Evaluation  Patient identified by MRN, date of birth, ID band Patient awake    Reviewed: Allergy & Precautions, NPO status , Patient's Chart, lab work & pertinent test results  Airway Mallampati: I  TM Distance: >3 FB Neck ROM: Full    Dental  (+) Dental Advisory Given   Pulmonary    Pulmonary exam normal        Cardiovascular hypertension, Pt. on medications + Peripheral Vascular Disease  Normal cardiovascular exam Rhythm:Regular     Neuro/Psych    GI/Hepatic   Endo/Other    Renal/GU      Musculoskeletal  (+) Arthritis ,   Abdominal   Peds  Hematology   Anesthesia Other Findings   Reproductive/Obstetrics                            Anesthesia Physical  Anesthesia Plan  ASA: III  Anesthesia Plan: MAC   Post-op Pain Management:    Induction: Intravenous  PONV Risk Score and Plan: 2 and Propofol infusion, Treatment may vary due to age or medical condition and TIVA  Airway Management Planned: Simple Face Mask  Additional Equipment:   Intra-op Plan:   Post-operative Plan:   Informed Consent: I have reviewed the patients History and Physical, chart, labs and discussed the procedure including the risks, benefits and alternatives for the proposed anesthesia with the patient or authorized representative who has indicated his/her understanding and acceptance.     Dental advisory given  Plan Discussed with: CRNA  Anesthesia Plan Comments:        Anesthesia Quick Evaluation

## 2019-08-25 NOTE — Transfer of Care (Signed)
Immediate Anesthesia Transfer of Care Note  Patient: [name]  Procedure(s) Performed: FLEXIBLE SIGMOIDOSCOPY (N/A ) BIOPSY  Patient Location: PACU and Endoscopy Unit  Anesthesia Type:MAC  Level of Consciousness: awake, alert , oriented and patient cooperative  Airway & Oxygen Therapy: Patient Spontanous Breathing and Patient connected to face mask oxygen  Post-op Assessment: Report given to RN, Post -op Vital signs reviewed and stable and Patient moving all extremities  Post vital signs: Reviewed and stable  Last Vitals:  Vitals Value Taken Time  BP    Temp    Pulse    Resp 14 08/25/19 1247  SpO2    Vitals shown include unvalidated device data.  Last Pain:  Vitals:   08/25/19 1109  TempSrc: Oral  PainSc: 0-No pain         Complications: No apparent anesthesia complications

## 2019-08-25 NOTE — Anesthesia Postprocedure Evaluation (Signed)
Anesthesia Post Note  Patient: [name]  Procedure(s) Performed: FLEXIBLE SIGMOIDOSCOPY (N/A ) BIOPSY     Patient location during evaluation: PACU Anesthesia Type: MAC Level of consciousness: awake and alert Pain management: pain level controlled Vital Signs Assessment: post-procedure vital signs reviewed and stable Respiratory status: spontaneous breathing Cardiovascular status: stable Anesthetic complications: no    Last Vitals:  Vitals:   08/25/19 1300 08/25/19 1331  BP: 139/77 134/74  Pulse: 74 76  Resp: (!) 21 19  Temp:  36.6 C  SpO2: 97% 98%    Last Pain:  Vitals:   08/25/19 1331  TempSrc: Oral  PainSc:                  Nolon Nations

## 2019-08-25 NOTE — Op Note (Signed)
[facility] Patient Name: [name] Procedure Date: 08/25/2019 MRN: [id number] Attending MD: Wonda Horner , MD Date of Birth: Oct 24, 1931 CSN: [id number] Age: 86 Admit Type: Inpatient Procedure:                Flexible Sigmoidoscopy Indications:              Abnormal CT of the GI tract Providers:                Wonda Horner, MD, Glori Bickers, RN, Laverda Sorenson, Technician, Herbie Drape, CRNA Referring MD:              Medicines:                Propofol per Anesthesia Complications:            No immediate complications. Estimated Blood Loss:     Estimated blood loss was minimal. Procedure:                Pre-Anesthesia Assessment:                           - Prior to the procedure, a History and Physical                            was performed, and patient medications and                            allergies were reviewed. The patient's tolerance of                            previous anesthesia was also reviewed. The risks                            and benefits of the procedure and the sedation                            options and risks were discussed with the patient.                            All questions were answered, and informed consent                            was obtained. Prior Anticoagulants: The patient has                            taken no previous anticoagulant or antiplatelet                            agents. ASA Grade Assessment: III - A patient with                            severe systemic disease. After reviewing the risks  and benefits, the patient was deemed in                            satisfactory condition to undergo the procedure.                           After obtaining informed consent, the scope was                            passed under direct vision. The [id number]-[id number]                            ([id number]) Olympus ultra slim endoscope was                             introduced through the anus and advanced to the the                            sigmoid colon. The flexible sigmoidoscopy was                            accomplished without difficulty. The patient                            tolerated the procedure well. The quality of the                            bowel preparation was poor. Scope In: 12:13:24 PM Scope Out: 12:35:18 PM Total Procedure Duration: 0 hours 21 minutes 54 seconds  Findings:      The perianal and digital rectal examinations were normal.      A partially obstructing large mass was found in the sigmoid colon. This       was biopsied with a cold forceps for histology. Area was tattooed with       an injection of 1 mL of [place] ink distal to the mass. The scope could       not be passed beyond the mass, but there was a contiuous stream of thick       liquid stool passing.. Impression:               - Preparation of the colon was poor.                           - Malignant partially obstructing tumor in the                            sigmoid colon. Biopsied. Tattooed. Moderate Sedation:      . Recommendation:           - Clear liquid diet. Miralax daily. May be able to                            clean out slowly without colonic stent. Procedure Code(s):        --- Professional ---  [id number], Sigmoidoscopy, flexible; with biopsy, single                            or multiple                           [id number], Sigmoidoscopy, flexible; with directed                            submucosal injection(s), any substance Diagnosis Code(s):        --- Professional ---                           C18.7, Malignant neoplasm of sigmoid colon                           K56.690, Other partial intestinal obstruction                           R93.3, Abnormal findings on diagnostic imaging of                            other parts of digestive tract CPT copyright 2019 [employer]. All rights reserved. The codes  documented in this report are preliminary and upon coder review may  be revised to meet current compliance requirements. Wonda Horner, MD 08/25/2019 12:45:43 PM This report has been signed electronically. Number of Addenda: 0

## 2019-08-26 ENCOUNTER — Inpatient Hospital Stay: Payer: Self-pay

## 2019-08-26 ENCOUNTER — Encounter: Payer: Self-pay | Admitting: *Deleted

## 2019-08-26 LAB — BASIC METABOLIC PANEL
Anion gap: 10 (ref 5–15)
BUN: 11 mg/dL (ref 8–23)
CO2: 19 mmol/L — ABNORMAL LOW (ref 22–32)
Calcium: 8.1 mg/dL — ABNORMAL LOW (ref 8.9–10.3)
Chloride: 108 mmol/L (ref 98–111)
Creatinine, Ser: 0.32 mg/dL — ABNORMAL LOW (ref 0.44–1.00)
GFR calc Af Amer: >60 mL/min (ref >60)
GFR calc non Af Amer: >60 mL/min (ref >60)
Glucose, Bld: 96 mg/dL (ref 70–99)
Potassium: 3.5 mmol/L (ref 3.5–5.1)
Sodium: 137 mmol/L (ref 135–145)

## 2019-08-26 LAB — TYPE AND SCREEN
ABO/RH(D): A POS
Antibody Screen: NEGATIVE

## 2019-08-26 LAB — ABO/RH: ABO/RH(D): A POS

## 2019-08-26 LAB — PHOSPHORUS: Phosphorus: 2.9 mg/dL (ref 2.5–4.6)

## 2019-08-26 LAB — CBC
HCT: 32.9 % — ABNORMAL LOW (ref 36.0–46.0)
Hemoglobin: 10.4 g/dL — ABNORMAL LOW (ref 12.0–15.0)
MCH: 29.7 pg (ref 26.0–34.0)
MCHC: 31.6 g/dL (ref 30.0–36.0)
MCV: 94 fL (ref 80.0–100.0)
Platelets: 286 10*3/uL (ref 150–400)
RBC: 3.5 MIL/uL — ABNORMAL LOW (ref 3.87–5.11)
RDW: 14.6 % (ref 11.5–15.5)
WBC: 5.9 10*3/uL (ref 4.0–10.5)
nRBC: 0 % (ref 0.0–0.2)

## 2019-08-26 LAB — MAGNESIUM: Magnesium: 1.9 mg/dL (ref 1.7–2.4)

## 2019-08-26 LAB — PREALBUMIN: Prealbumin: 5.4 mg/dL — ABNORMAL LOW (ref 18–38)

## 2019-08-26 LAB — PROTIME-INR
INR: 1.2 (ref 0.8–1.2)
Prothrombin Time: 15.1 seconds (ref 11.4–15.2)

## 2019-08-26 MED ORDER — DOCUSATE SODIUM 100 MG PO CAPS
100.0000 mg | ORAL_CAPSULE | Freq: Two times a day (BID) | ORAL | Status: DC
  Administered 2019-08-26 (×2): 100 mg via ORAL
  Filled 2019-08-26 (×3): qty 1

## 2019-08-26 MED ORDER — POLYETHYLENE GLYCOL 3350 17 G PO PACK
17.0000 g | PACK | Freq: Two times a day (BID) | ORAL | Status: DC
  Administered 2019-08-26: 17 g via ORAL
  Filled 2019-08-26 (×2): qty 1

## 2019-08-26 MED ORDER — TRAVASOL 10 % IV SOLN
INTRAVENOUS | Status: AC
  Filled 2019-08-26: qty 360

## 2019-08-26 MED ORDER — CHLORHEXIDINE GLUCONATE CLOTH 2 % EX PADS
6.0000 | MEDICATED_PAD | Freq: Every day | CUTANEOUS | Status: DC
  Administered 2019-08-26 – 2019-09-05 (×11): 6 via TOPICAL

## 2019-08-26 MED ORDER — SODIUM CHLORIDE 0.9% FLUSH
10.0000 mL | Freq: Two times a day (BID) | INTRAVENOUS | Status: DC
  Administered 2019-08-26 – 2019-08-31 (×8): 10 mL
  Administered 2019-09-01: 20 mL
  Administered 2019-09-02 – 2019-09-04 (×3): 10 mL

## 2019-08-26 MED ORDER — INSULIN ASPART 100 UNIT/ML ~~LOC~~ SOLN
0.0000 [IU] | Freq: Four times a day (QID) | SUBCUTANEOUS | Status: DC
  Administered 2019-08-27 – 2019-08-30 (×8): 1 [IU] via SUBCUTANEOUS
  Administered 2019-08-31: 2 [IU] via SUBCUTANEOUS
  Administered 2019-08-31: 3 [IU] via SUBCUTANEOUS

## 2019-08-26 MED ORDER — ENOXAPARIN SODIUM 40 MG/0.4ML ~~LOC~~ SOLN
40.0000 mg | SUBCUTANEOUS | Status: DC
  Administered 2019-08-26 – 2019-08-28 (×3): 40 mg via SUBCUTANEOUS
  Filled 2019-08-26 (×3): qty 0.4

## 2019-08-26 MED ORDER — BISACODYL 10 MG RE SUPP: 10.0000 mg | Freq: Every day | RECTAL | Status: DC | PRN

## 2019-08-26 MED ORDER — LACTATED RINGERS IV SOLN: INTRAVENOUS | Status: DC

## 2019-08-26 MED ORDER — POTASSIUM CHLORIDE 20 MEQ/15ML (10%) PO SOLN
40.0000 meq | Freq: Once | ORAL | Status: AC
  Administered 2019-08-26: 40 meq via ORAL
  Filled 2019-08-26: qty 30

## 2019-08-26 MED ORDER — SODIUM CHLORIDE 0.9% FLUSH
10.0000 mL | INTRAVENOUS | Status: DC | PRN
  Administered 2019-08-27 – 2019-09-01 (×2): 10 mL

## 2019-08-26 MED ORDER — BOOST / RESOURCE BREEZE PO LIQD CUSTOM
1.0000 | Freq: Three times a day (TID) | ORAL | Status: DC
  Administered 2019-08-26 (×2): 1 via ORAL

## 2019-08-26 NOTE — Progress Notes (Signed)
[facility] Gastroenterology Progress Note  [name] 86 y.o. January 30, 1932 Patient underwent flexible sigmoidoscopy yesterday which showed malignant partially obstructing tumor in the sigmoid colon ([id number])    CC:  Sigmoid adenocarcinoma   Subjective: Patient reports she has some abdominal bloating and mild discomfort after flexible sigmoidoscopy yesterday.  She hasn't had a bowel movement since the procedure but states she feels like she needs to have one.  She has not noticed any rectal bleeding post-procedure.  She has not had any nausea/vomiting.  She tolerated a liquid diet today.  ROS :  Review of Systems  Constitutional: Negative for chills and fever.  Gastrointestinal: Negative for abdominal pain, blood in stool, constipation, diarrhea, heartburn, melena, nausea and vomiting.   Objective: Vital signs in last 24 hours: Vitals:   08/25/19 1952 08/26/19 0538  BP: 129/72 131/76  Pulse: 77 80  Resp: 16 19  Temp: 98.1 F (36.7 C) 98 F (36.7 C)  SpO2: 100% 95%    Physical Exam: General: alert, oriented, no acute distress Pulm: Clear to auscultation bilaterally, breathing comfortably on room air Heart: regular rate and rhythm, no murmurs auscultated Abd: Soft, mildly distended, no tenderness to palpation.  Normoactive bowel sounds.  Lab Results: Recent Labs    08/24/19 0450 08/24/19 0450 08/25/19 0558 08/26/19 0532  NA 136   < > 139 137  K 3.7   < > 3.6 3.5  CL 102   < > 103 108  CO2 21*   < > 20* 19*  GLUCOSE 88   < > 67* 96  BUN 25*   < > 21 11  CREATININE 0.43*   < > 0.47 0.32*  CALCIUM 8.7*   < > 8.4* 8.1*  MG 2.2  --   --   --    < > = values in this interval not displayed.   Recent Labs    08/24/19 0450 08/25/19 0558  AST 26 27  ALT 16 16  ALKPHOS 114 94  BILITOT 0.7 1.0  PROT 6.4* 5.5*  ALBUMIN 3.4* 2.8*   Recent Labs    08/23/19 1404 08/24/19 0450 08/25/19 0558 08/26/19 0532  WBC 8.2   < > 5.6 5.9  NEUTROABS 5.8  --   --   --    HGB 11.9*   < > 10.9* 10.4*  HCT 36.5   < > 33.6* 32.9*  MCV 91.7   < > 91.3 94.0  PLT 368   < > 300 286   < > = values in this interval not displayed.   Recent Labs    08/24/19 0450 08/26/19 0532  LABPROT 14.3 15.1  INR 1.1 1.2      Assessment/Plan: Sigmoid adenocarcinoma, with liver metastases. She underwent flexible sigmoidoscopy yesterday and tolerated the procedure well.  Case discussed with Dr. Penelope Coop.  Surgery is following.  Plans for surgery next week.  Dr. Penelope Coop recommends a slow clean out over the weekend.  [facility] will sign off.  Please contact us if we can be of any further assistance during this hospital stay.  Salley Slaughter PA-C 08/26/2019, 10:01 AM  Contact #  [phone]

## 2019-08-26 NOTE — Progress Notes (Signed)
Peripherally Inserted Central Catheter Placement  The IV Nurse has discussed with the patient and/or persons authorized to consent for the patient, the purpose of this procedure and the potential benefits and risks involved with this procedure.  The benefits include less needle sticks, lab draws from the catheter, and the patient may be discharged home with the catheter. Risks include, but not limited to, infection, bleeding, blood clot (thrombus formation), and puncture of an artery; nerve damage and irregular heartbeat and possibility to perform a PICC exchange if needed/ordered by physician.  Alternatives to this procedure were also discussed.  Bard Power PICC patient education guide, fact sheet on infection prevention and patient information card has been provided to patient /or left at bedside.  [name] included in consent with speaker phone at patients request.    PICC Placement Documentation  PICC Double Lumen [id number] PICC Right Basilic 39 cm 0 cm (Active)  Indication for Insertion or Continuance of Line Administration of hyperosmolar/irritating solutions (i.e. TPN, Vancomycin, etc.) 08/26/19 1228  Exposed Catheter (cm) 0 cm 08/26/19 1228  Site Assessment Clean;Intact;Dry 08/26/19 1228  Lumen #1 Status Flushed;Saline locked;Blood return noted 08/26/19 1228  Lumen #2 Status Flushed;Saline locked;Blood return noted 08/26/19 1228  Dressing Type Transparent 08/26/19 1228  Dressing Status Clean;Dry;Intact;Antimicrobial disc in place 08/26/19 1228  Dressing Change Due 09/02/19 08/26/19 1228       Gordan Payment 08/26/2019, 12:30 PM

## 2019-08-26 NOTE — Progress Notes (Signed)
Pathology called and said biopsy of sigmoid mass was adenocarcinoma. Surgery note seen. Plan for surgery next week. Try a slow clean out over the weekend. Will sign off for now. Call us if needed.

## 2019-08-26 NOTE — Care Management Important Message (Signed)
Important Message  Patient Details IM Letter given to RN to present to the Patient Name: [name] MRN: [id number] Date of Birth: 04-11-1932   Medicare Important Message Given:  Yes     Raizy, Vader 08/26/2019, 10:11 AM

## 2019-08-26 NOTE — Progress Notes (Addendum)
PHARMACY - TOTAL PARENTERAL NUTRITION CONSULT NOTE   Indication: sigmoid colon mass with large bowel obstruction  Patient Measurements: Weight: 57 kg (125 lb 10.6 oz)   Body mass index is 19.68 kg/m.   Assessment: 86 yo F with sigmoid colon mass causing large bowel obstruction, small bowel with thickening and liver mass.  Planning for sigmoid colon resection early next week.   Glucose / Insulin: No hx diabetes.  Glucose has been low/normal so far this admission. Electrolytes: WNL except K at 3.5, Mag 1.9 (goal K >4, Mag >2), CorrCa 9.06 Renal: Scr low, at baseline LFTs / TGs: WNL, Triglycerides ordered for AM. Prealbumin / albumin: Low- 5.4 (4/16)/ 2.8 Intake / Output; MIVF:  24h I/O 1596.8/500; LR @ 23ml/hr GI Imaging: 4/13 CT abdomen: infiltrating sigmoid colon mass suspicious for colon CA resulting in obstruction.  Surgeries / Procedures:  4/15 s/p flex sig  Central access: PICC line ordered to be placed 08/26/19 TPN start date: 08/26/19  Nutritional Goals (per RD recommendation on 08/26/19): kCal: 1750-1950, Protein: 86-100, Fluid: >1.7L/day  Goal TPN rate is 75 mL/hr (provides 90 g of protein and 1818 kcals per day)  Current Nutrition:  Clear liquids -->started 4/15;no plans to advance.  No PO intake charted. Boost TID ordered to start 4/16.  Plan:  KCl 41mEq solution PO x1 now  At 6pm: Start TPN at 30 mL/hr at 1800 with 15% dextrose, 30% lipids, 10% protein. Electrolytes in TPN: 35mEq/L of Na, 78mEq/L of K, 54mEq/L of Ca, 74mEq/L of Mg, and 68mmol/L of Phos. Cl:Ac ratio 1:1 Add standard MVI and trace elements to TPN Initiate Sensitive q6h SSI and adjust as needed  Reduce MIVF to 45 mL/hr at 1800 Monitor TPN labs on Mon/Thurs, baseline labs (Cmet, Mag, Phos, TGs, CBC with diff) ordered for AM.    Netta Cedars PharmD, BCPS 08/26/2019,10:51 AM

## 2019-08-26 NOTE — Progress Notes (Signed)
Nutrition Follow-up  DOCUMENTATION CODES:   Not applicable  INTERVENTION:  Boost Breeze po TID, each supplement provides 250 kcal and 9 grams of protein  TPN per pharmacist  NUTRITION DIAGNOSIS:   Inadequate oral intake related to altered GI function(sigmoid colon mass with large distal colonic obstruction) as evidenced by NPO status. Ongoing, diet advanced to CL, TPN for nutrition support   GOAL:   Patient will meet greater than or equal to 90% of their needs Unmet, progressing    MONITOR:   Labs, I & O's, Diet advancement, Weight trends  REASON FOR ASSESSMENT:   Consult New TPN/TNA  ASSESSMENT:  RD working remotely.  86 year old female with past medical history significant for PVD s/p left BKA (2016) followed by right BKA (2019), HTN, admitted on 4/13 for large bowel obstruction after presenting to ED from home with complaints of generalized crampy abdominal discomfort and no bowel movement over the last 3-4 days.  Significant Events: 4/13 admit for large bowel obstruction, CT showed sigmoid colon mass, distal colonic obstruction with mets to liver 4/14 NG tube placed in IR, pt pulled overnight 4/15 flex sigmoidoscopy with biopsy  4/15 diet advanced to CLD  RD consulted for new TNA.   Current wt 125.4 lbs Estimated needs completed on initial assessment based off of IBW due to admit weight was from 2019. Will adjust needs to reflect current weight.   Per notes: -CEA 89.4 -biopsy results with adenocarcinoma -sigmoid colon resection planned for Monday/early next week pending OR availability  Medications reviewed and include: colace, SSI Labs reviewed  Diet Order:   Diet Order            Diet clear liquid Room service appropriate? Yes; Fluid consistency: Thin  Diet effective now              EDUCATION NEEDS:   No education needs have been identified at this time  Skin:  Skin Assessment: Skin Integrity Issues: Skin Integrity Issues:: Other  (Comment) Other: MASD; buttocks  Last BM:  4/15  Height:   Ht Readings from Last 1 Encounters:  07/23/17 5\' 7"  (1.702 m)    Weight:   Wt Readings from Last 1 Encounters:  08/26/19 57 kg    Ideal Body Weight:  54.1 kg(Adjusted IBW for bilateral BKA)  BMI:  Body mass index is 19.68 kg/m.  Estimated Nutritional Needs:   Kcal:  [id number]  Protein:  86-100  Fluid:  >/= 1.7 L/day   Lajuan Lines, RD, LDN Clinical Nutrition After Hours/Weekend Pager # in [place]

## 2019-08-26 NOTE — Progress Notes (Signed)
PROGRESS NOTE    [name]  [id number] DOB: Jul 09, 1931 DOA: 08/23/2019 PCP: [name], MD  Brief Narrative:[name] has history of PAD s/p B/L BKA for which she takes Plavix daily. Followed with Dr. Oneida Alar. She was admitted 4/13 with large bowel obstruction. CT showed sigmoid colon mass with distal colonic obstruction with metastases to the liver. Surgery following.  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 IV fluids and supportive care -Flex sig 4/15 with malignant partially obstructing tumor in the sigmoid colon, biopsied, pathology with adenocarcinoma -General surgery following, plan for surgery early next week -Starting TPN today  Asymptomatic pyuria -No indication for antibiotics no symptoms, monitor at this time  Dehydration -To start TPN today, stop IV fluids  History of PAD -Bilateral BKA -Hold Plavix  Essential hypertension -Lisinopril on hold  GERD -Continue PPI  DVT prophylaxis: SCDs  code Status: DNR Family Communication: No family at bedside will update grandson Disposition Plan: Patient is from home, plan for discharge home pending work-up and treatment, surgery for large obstructing colon mass    Consultants:   General surgery  [facility]   Procedures: Flex sig 4/15 with malignant partially obstructing tumor in the sigmoid colon  Antimicrobials:    Subjective: -Mild abdominal discomfort and bloating, appear to have clear liquids, denies any vomiting  Objective: Vitals:   08/25/19 1300 08/25/19 1331 08/25/19 1952 08/26/19 0538  BP: 139/77 134/74 129/72 131/76  Pulse: 74 76 77 80  Resp: (!) 21 19 16 19   Temp:  97.8 F (36.6 C) 98.1 F (36.7 C) 98 F (36.7 C)  TempSrc:  Oral Oral Oral  SpO2: 97% 98% 100% 95%  Weight:    57 kg    Intake/Output Summary (Last 24 hours) at  08/26/2019 1224 Last data filed at 08/26/2019 0800 Gross per 24 hour  Intake 996.78 ml  Output 200 ml  Net 796.78 ml   Filed Weights   08/26/19 0538  Weight: 57 kg    Examination:  Gen: Pleasant elderly female sitting up in bed, AAOx3, no distress HEENT: no JVD Lungs: Decreased breath sounds the bases CVS: RRR,No Gallops,Rubs or new Murmurs Abd: Soft, mildly distended, nontender, bowel sounds present  extremities: No edema, bilateral BKA  skin: no new rashes Psychiatry: Judgement and insight appear normal. Mood & affect appropriate.     Data Reviewed:   CBC: Recent Labs  Lab 08/23/19 1404 08/24/19 0450 08/25/19 0558 08/26/19 0532  WBC 8.2 7.7 5.6 5.9  NEUTROABS 5.8  --   --   --   HGB 11.9* 11.7* 10.9* 10.4*  HCT 36.5 36.4 33.6* 32.9*  MCV 91.7 91.5 91.3 94.0  PLT 368 377 300 [id number]   Basic Metabolic Panel: Recent Labs  Lab 08/23/19 1404 08/24/19 0450 08/25/19 0558 08/26/19 0532  NA 136 136 139 137  K 3.9 3.7 3.6 3.5  CL 101 102 103 108  CO2 25 21* 20* 19*  GLUCOSE 117* 88 67* 96  BUN 26* 25* 21 11  CREATININE 0.45 0.43* 0.47 0.32*  CALCIUM 9.0 8.7* 8.4* 8.1*  MG  --  2.2  --   --    GFR: CrCl cannot be calculated (Unknown ideal weight.). Liver Function Tests: Recent Labs  Lab 08/23/19 1404 08/24/19 0450 08/25/19 0558  AST 21 26 27   ALT 15 16 16   ALKPHOS 121 114 94  BILITOT 0.5  0.7 1.0  PROT 6.5 6.4* 5.5*  ALBUMIN 3.3* 3.4* 2.8*   Recent Labs  Lab 08/23/19 1404  LIPASE 18   No results for input(s): AMMONIA in the last 168 hours. Coagulation Profile: Recent Labs  Lab 08/24/19 0450 08/26/19 0532  INR 1.1 1.2   Cardiac Enzymes: No results for input(s): CKTOTAL, CKMB, CKMBINDEX, TROPONINI in the last 168 hours. BNP (last 3 results) No results for input(s): PROBNP in the last 8760 hours. HbA1C: No results for input(s): HGBA1C in the last 72 hours. CBG: No results for input(s): GLUCAP in the last 168 hours. Lipid Profile: No results  for input(s): CHOL, HDL, LDLCALC, TRIG, CHOLHDL, LDLDIRECT in the last 72 hours. Thyroid Function Tests: No results for input(s): TSH, T4TOTAL, FREET4, T3FREE, THYROIDAB in the last 72 hours. Anemia Panel: No results for input(s): VITAMINB12, FOLATE, FERRITIN, TIBC, IRON, RETICCTPCT in the last 72 hours. Urine analysis:    Component Value Date/Time   COLORURINE YELLOW 08/23/2019 1419   APPEARANCEUR HAZY (A) 08/23/2019 1419   LABSPEC 1.023 08/23/2019 1419   PHURINE 5.0 08/23/2019 1419   GLUCOSEU NEGATIVE 08/23/2019 1419   HGBUR NEGATIVE 08/23/2019 1419   BILIRUBINUR NEGATIVE 08/23/2019 1419   KETONESUR 5 (A) 08/23/2019 1419   PROTEINUR NEGATIVE 08/23/2019 1419   NITRITE NEGATIVE 08/23/2019 1419   LEUKOCYTESUR SMALL (A) 08/23/2019 1419   Sepsis Labs: @LABRCNTIP (procalcitonin:4,lacticidven:4)  ) Recent Results (from the past 240 hour(s))  Respiratory Panel by RT PCR (Flu A&B, Covid) - Nasopharyngeal Swab     Status: None   Collection Time: 08/23/19  6:35 PM   Specimen: Nasopharyngeal Swab  Result Value Ref Range Status   SARS Coronavirus 2 by RT PCR NEGATIVE NEGATIVE Final    Comment: (NOTE) SARS-CoV-2 target nucleic acids are NOT DETECTED. The SARS-CoV-2 RNA is generally detectable in upper respiratoy specimens during the acute phase of infection. The lowest concentration of SARS-CoV-2 viral copies this assay can detect is 131 copies/mL. A negative result does not preclude SARS-Cov-2 infection and should not be used as the sole basis for treatment or other patient management decisions. A negative result may occur with  improper specimen collection/handling, submission of specimen other than nasopharyngeal swab, presence of viral mutation(s) within the areas targeted by this assay, and inadequate number of viral copies (<131 copies/mL). A negative result must be combined with clinical observations, patient history, and epidemiological information. The expected result is  Negative. Fact Sheet for Patients:  PinkCheek.be Fact Sheet for Healthcare Providers:  GravelBags.it This test is not yet ap proved or cleared by the [place] FDA and  has been authorized for detection and/or diagnosis of SARS-CoV-2 by FDA under an Emergency Use Authorization (EUA). This EUA will remain  in effect (meaning this test can be used) for the duration of the COVID-19 declaration under Section 564(b)(1) of the Act, 21 U.S.C. section 360bbb-3(b)(1), unless the authorization is terminated or revoked sooner.    Influenza A by PCR NEGATIVE NEGATIVE Final   Influenza B by PCR NEGATIVE NEGATIVE Final    Comment: (NOTE) The Xpert Xpress SARS-CoV-2/FLU/RSV assay is intended as an aid in  the diagnosis of influenza from Nasopharyngeal swab specimens and  should not be used as a sole basis for treatment. Nasal washings and  aspirates are unacceptable for Xpert Xpress SARS-CoV-2/FLU/RSV  testing. Fact Sheet for Patients: PinkCheek.be Fact Sheet for Healthcare Providers: GravelBags.it This test is not yet approved or cleared by the [place] FDA and  has been authorized for detection and/or  diagnosis of SARS-CoV-2 by  FDA under an Emergency Use Authorization (EUA). This EUA will remain  in effect (meaning this test can be used) for the duration of the  Covid-19 declaration under Section 564(b)(1) of the Act, 21  U.S.C. section 360bbb-3(b)(1), unless the authorization is  terminated or revoked. Performed at [facility], [place] [address] [place], [place] [address]          Radiology Studies: DG INTRO LONG GI TUBE  Result Date: 08/25/2019 CLINICAL DATA:  Abdominal distension, nutritional needs EXAM: FL FEEDING TUBE PLACEMENT FLUOROSCOPY TIME:  Fluoroscopy Time:  1 minutes, 12 seconds Radiation Exposure Index (if provided by the  fluoroscopic device): 7.9 mGy Number of Acquired Spot Images: 0 COMPARISON:  None. FINDINGS: Local anesthetic was applied to the right nostril. A nasogastric tube was advanced without difficulty from the right nostril into the stomach under fluoroscopic observation. The tube was taped in place using a dedicated nasal tape apparatus with clamp. The nasogastric tube is ready to use. IMPRESSION: 1. Successful fluoroscopically guided nasogastric tube placement. Electronically Signed   By: Van Clines M.D.   On: 08/25/2019 08:32   ECHOCARDIOGRAM COMPLETE  Result Date: 08/24/2019    ECHOCARDIOGRAM REPORT   Patient Name:   JAIYLAH KNIBBS Date of Exam: 08/24/2019 Medical Rec #:  [id number]          Height:       67.0 in Accession #:    [id number]         Weight:       130.0 lb Date of Birth:  [birth year]          BSA:          1.684 m Patient Age:    86 years           BP:           127/81 mmHg Patient Gender: F                  HR:           90 bpm. Exam Location:  Inpatient Procedure: 2D Echo Indications:    Murmur 785.2 / R01.1  History:        Patient has no prior history of Echocardiogram examinations.                 PAD, Arrythmias:LBBB; Risk Factors:Hypertension. Double BKA,                 uterine cancer.  Sonographer:    Darlina Sicilian RDCS Referring Phys: [id number] [place]  1. Left ventricular ejection fraction, by estimation, is 60 to 65%. The left ventricle has normal function. The left ventricle has no regional wall motion abnormalities. Left ventricular diastolic parameters are consistent with Grade I diastolic dysfunction (impaired relaxation).  2. Right ventricular systolic function is normal. The right ventricular size is normal.  3. The mitral valve is normal in structure. No evidence of mitral valve regurgitation. No evidence of mitral stenosis.  4. The aortic valve is normal in structure. Aortic valve regurgitation is mild. No aortic stenosis is present. FINDINGS  Left  Ventricle: Left ventricular ejection fraction, by estimation, is 60 to 65%. The left ventricle has normal function. The left ventricle has no regional wall motion abnormalities. The left ventricular internal cavity size was normal in size. There is  no left ventricular hypertrophy. Left ventricular diastolic parameters are consistent with Grade I diastolic dysfunction (impaired relaxation). Right Ventricle:  The right ventricular size is normal. No increase in right ventricular wall thickness. Right ventricular systolic function is normal. Left Atrium: Left atrial size was normal in size. Right Atrium: Right atrial size was normal in size. Pericardium: There is no evidence of pericardial effusion. Mitral Valve: The mitral valve is normal in structure. No evidence of mitral valve regurgitation. No evidence of mitral valve stenosis. Tricuspid Valve: The tricuspid valve is normal in structure. Tricuspid valve regurgitation is trivial. Aortic Valve: The aortic valve is normal in structure. Aortic valve regurgitation is mild. Aortic regurgitation PHT measures 510 msec. No aortic stenosis is present. Mild aortic valve annular calcification. Pulmonic Valve: The pulmonic valve was normal in structure. Pulmonic valve regurgitation is not visualized. No evidence of pulmonic stenosis. Aorta: The aortic root and ascending aorta are structurally normal, with no evidence of dilitation. IAS/Shunts: The atrial septum is grossly normal.  LEFT VENTRICLE PLAX 2D LVIDd:         4.00 cm     Diastology LVIDs:         3.10 cm     LV e' lateral:   6.42 cm/s LV PW:         1.10 cm     LV E/e' lateral: 9.7 LV IVS:        1.40 cm     LV e' medial:    6.74 cm/s LVOT diam:     1.80 cm     LV E/e' medial:  9.2 LV SV:         74 LV SV Index:   44 LVOT Area:     2.54 cm  LV Volumes (MOD) LV vol d, MOD A2C: 61.5 ml LV vol d, MOD A4C: 81.7 ml LV vol s, MOD A2C: 44.1 ml LV vol s, MOD A4C: 50.0 ml LV SV MOD A2C:     17.4 ml LV SV MOD A4C:     81.7 ml  LV SV MOD BP:      22.9 ml RIGHT VENTRICLE RV S prime:     15.90 cm/s TAPSE (M-mode): 1.3 cm LEFT ATRIUM             Index       RIGHT ATRIUM           Index LA diam:        3.60 cm 2.14 cm/m  RA Area:     11.70 cm LA Vol (A2C):   42.6 ml 25.30 ml/m RA Volume:   24.50 ml  14.55 ml/m LA Vol (A4C):   33.9 ml 20.13 ml/m LA Biplane Vol: 39.7 ml 23.58 ml/m  AORTIC VALVE LVOT Vmax:   197.00 cm/s LVOT Vmean:  111.000 cm/s LVOT VTI:    0.290 m AI PHT:      510 msec  AORTA Ao Root diam: 3.00 cm Ao Asc diam:  3.50 cm MITRAL VALVE                TRICUSPID VALVE MV Area (PHT): 2.82 cm     TR Peak grad:   26.0 mmHg MV Decel Time: 269 msec     TR Vmax:        255.00 cm/s MV E velocity: 62.10 cm/s MV A velocity: 113.00 cm/s  SHUNTS MV E/A ratio:  0.55         Systemic VTI:  0.29 m  Systemic Diam: 1.80 cm Mertie Moores MD Electronically signed by Mertie Moores MD Signature Date/Time: 08/24/2019/3:26:02 PM    Final    [place] EKG SITE RITE  Result Date: 08/26/2019 If Site Rite image not attached, placement could not be confirmed due to current cardiac rhythm.    Scheduled Meds: . docusate sodium  100 mg Oral BID  . enoxaparin (LOVENOX) injection  40 mg Subcutaneous Q24H  . feeding supplement  1 Container Oral TID BM  . [START ON 08/27/2019] insulin aspart  0-9 Units Subcutaneous Q6H  . polyethylene glycol  17 g Oral BID  . sodium chloride flush  3 mL Intravenous Q12H   Continuous Infusions: . lactated ringers 75 mL/hr at 08/25/19 1757  . lactated ringers    . TPN ADULT (ION)       LOS: 3 days    Time spent: 55min  Domenic Polite, MD [facility]  08/26/2019, 12:24 PM

## 2019-08-26 NOTE — Progress Notes (Signed)
[name]  1 Day Post-Op  Subjective: Patient reports she is feeling better after GI procedure yesterday. She is bloated and no flatus or BM this AM yet. Denies abdominal pain or nausea. We discussed probable OR early next week.   Review of Systems  Constitutional: Negative for chills and fever.  Respiratory: Negative for shortness of breath.   Cardiovascular: Negative for chest pain.  Gastrointestinal: Positive for constipation. Negative for abdominal pain, nausea and vomiting.       Bloating   Genitourinary: Negative for dysuria, frequency and urgency.     Objective: Vital signs in last 24 hours: Temp:  [97.3 F (36.3 C)-98.4 F (36.9 C)] 98 F (36.7 C) (04/16 0538) Pulse Rate:  [74-80] 80 (04/16 0538) Resp:  [15-21] 19 (04/16 0538) BP: (91-145)/(49-77) 131/76 (04/16 0538) SpO2:  [95 %-100 %] 95 % (04/16 0538) Weight:  [57 kg] 57 kg (04/16 0538) Last BM Date: 08/25/19(per dayshift RN)  Intake/Output from previous day: 04/15 0701 - 04/16 0700 In: 996.8 [I.V.:996.8] Out: -  Intake/Output this shift: Total I/O In: -  Out: 200 [Urine:200]  PE: General: pleasant, WD, malnourished female, NAD HEENT:  Sclera are noninjected.  PERRL.  Ears and nose without any masses or lesions.  Mouth is pink and moist Heart: regular, rate, and rhythm.  Normal s1,s2. No obvious murmurs, gallops, or rubs noted.  Palpable radial and pedal pulses bilaterally Lungs: No wheezes, rhonchi, or rales noted in peripheral lung fields. Some harsh tracheal sounds more centrally.  Respiratory effort nonlabored Abd: soft, NT, distended, BS hypoactive, no masses, hernias, or organomegaly MS: BL upper extremities are symmetrical with no cyanosis, clubbing, or edema. BL BKA Skin: warm and dry with no masses, lesions, or rashes Neuro: Cranial nerves 2-12 grossly intact, sensation grossly intact  Psych: A&Ox3 with an appropriate affect.   Lab Results:  Recent Labs     08/25/19 0558 08/26/19 0532  WBC 5.6 5.9  HGB 10.9* 10.4*  HCT 33.6* 32.9*  PLT 300 286   BMET Recent Labs    08/25/19 0558 08/26/19 0532  NA 139 137  K 3.6 3.5  CL 103 108  CO2 20* 19*  GLUCOSE 67* 96  BUN 21 11  CREATININE 0.47 0.32*  CALCIUM 8.4* 8.1*   PT/INR Recent Labs    08/24/19 0450 08/26/19 0532  LABPROT 14.3 15.1  INR 1.1 1.2   CMP     Component Value Date/Time   NA 137 08/26/2019 0532   K 3.5 08/26/2019 0532   CL 108 08/26/2019 0532   CO2 19 (L) 08/26/2019 0532   GLUCOSE 96 08/26/2019 0532   BUN 11 08/26/2019 0532   CREATININE 0.32 (L) 08/26/2019 0532   CREATININE 0.44 (L) 04/14/2017 0859   CALCIUM 8.1 (L) 08/26/2019 0532   PROT 5.5 (L) 08/25/2019 0558   ALBUMIN 2.8 (L) 08/25/2019 0558   AST 27 08/25/2019 0558   ALT 16 08/25/2019 0558   ALKPHOS 94 08/25/2019 0558   BILITOT 1.0 08/25/2019 0558   GFRNONAA >60 08/26/2019 0532   GFRAA >60 08/26/2019 0532   Lipase     Component Value Date/Time   LIPASE 18 08/23/2019 1404       Studies/Results: DG INTRO LONG GI TUBE  Result Date: 08/25/2019 CLINICAL DATA:  Abdominal distension, nutritional needs EXAM: FL FEEDING TUBE PLACEMENT FLUOROSCOPY TIME:  Fluoroscopy Time:  1 minutes, 12 seconds Radiation Exposure Index (if provided by the fluoroscopic device): 7.9 mGy Number of Acquired Spot Images: 0  COMPARISON:  None. FINDINGS: Local anesthetic was applied to the right nostril. A nasogastric tube was advanced without difficulty from the right nostril into the stomach under fluoroscopic observation. The tube was taped in place using a dedicated nasal tape apparatus with clamp. The nasogastric tube is ready to use. IMPRESSION: 1. Successful fluoroscopically guided nasogastric tube placement. Electronically Signed   By: Van Clines M.D.   On: 08/25/2019 08:32   ECHOCARDIOGRAM COMPLETE  Result Date: 08/24/2019    ECHOCARDIOGRAM REPORT   Patient Name:   [name] Date of Exam: 08/24/2019  Medical Rec #:  [id number]          Height:       67.0 in Accession #:    [id number]         Weight:       130.0 lb Date of Birth:  1931-05-25          BSA:          1.684 m Patient Age:    86 years           BP:           127/81 mmHg Patient Gender: F                  HR:           90 bpm. Exam Location:  Inpatient Procedure: 2D Echo Indications:    Murmur 785.2 / R01.1  History:        Patient has no prior history of Echocardiogram examinations.                 PAD, Arrythmias:LBBB; Risk Factors:Hypertension. Double BKA,                 uterine cancer.  Sonographer:    Darlina Sicilian RDCS Referring Phys: [id number] [place]  1. Left ventricular ejection fraction, by estimation, is 60 to 65%. The left ventricle has normal function. The left ventricle has no regional wall motion abnormalities. Left ventricular diastolic parameters are consistent with Grade I diastolic dysfunction (impaired relaxation).  2. Right ventricular systolic function is normal. The right ventricular size is normal.  3. The mitral valve is normal in structure. No evidence of mitral valve regurgitation. No evidence of mitral stenosis.  4. The aortic valve is normal in structure. Aortic valve regurgitation is mild. No aortic stenosis is present. FINDINGS  Left Ventricle: Left ventricular ejection fraction, by estimation, is 60 to 65%. The left ventricle has normal function. The left ventricle has no regional wall motion abnormalities. The left ventricular internal cavity size was normal in size. There is  no left ventricular hypertrophy. Left ventricular diastolic parameters are consistent with Grade I diastolic dysfunction (impaired relaxation). Right Ventricle: The right ventricular size is normal. No increase in right ventricular wall thickness. Right ventricular systolic function is normal. Left Atrium: Left atrial size was normal in size. Right Atrium: Right atrial size was normal in size. Pericardium: There is no evidence  of pericardial effusion. Mitral Valve: The mitral valve is normal in structure. No evidence of mitral valve regurgitation. No evidence of mitral valve stenosis. Tricuspid Valve: The tricuspid valve is normal in structure. Tricuspid valve regurgitation is trivial. Aortic Valve: The aortic valve is normal in structure. Aortic valve regurgitation is mild. Aortic regurgitation PHT measures 510 msec. No aortic stenosis is present. Mild aortic valve annular calcification. Pulmonic Valve: The pulmonic valve was normal in structure. Pulmonic valve regurgitation is not  visualized. No evidence of pulmonic stenosis. Aorta: The aortic root and ascending aorta are structurally normal, with no evidence of dilitation. IAS/Shunts: The atrial septum is grossly normal.  LEFT VENTRICLE PLAX 2D LVIDd:         4.00 cm     Diastology LVIDs:         3.10 cm     LV e' lateral:   6.42 cm/s LV PW:         1.10 cm     LV E/e' lateral: 9.7 LV IVS:        1.40 cm     LV e' medial:    6.74 cm/s LVOT diam:     1.80 cm     LV E/e' medial:  9.2 LV SV:         74 LV SV Index:   44 LVOT Area:     2.54 cm  LV Volumes (MOD) LV vol d, MOD A2C: 61.5 ml LV vol d, MOD A4C: 81.7 ml LV vol s, MOD A2C: 44.1 ml LV vol s, MOD A4C: 50.0 ml LV SV MOD A2C:     17.4 ml LV SV MOD A4C:     81.7 ml LV SV MOD BP:      22.9 ml RIGHT VENTRICLE RV S prime:     15.90 cm/s TAPSE (M-mode): 1.3 cm LEFT ATRIUM             Index       RIGHT ATRIUM           Index LA diam:        3.60 cm 2.14 cm/m  RA Area:     11.70 cm LA Vol (A2C):   42.6 ml 25.30 ml/m RA Volume:   24.50 ml  14.55 ml/m LA Vol (A4C):   33.9 ml 20.13 ml/m LA Biplane Vol: 39.7 ml 23.58 ml/m  AORTIC VALVE LVOT Vmax:   197.00 cm/s LVOT Vmean:  111.000 cm/s LVOT VTI:    0.290 m AI PHT:      510 msec  AORTA Ao Root diam: 3.00 cm Ao Asc diam:  3.50 cm MITRAL VALVE                TRICUSPID VALVE MV Area (PHT): 2.82 cm     TR Peak grad:   26.0 mmHg MV Decel Time: 269 msec     TR Vmax:        255.00 cm/s MV E  velocity: 62.10 cm/s MV A velocity: 113.00 cm/s  SHUNTS MV E/A ratio:  0.55         Systemic VTI:  0.29 m                             Systemic Diam: 1.80 cm Mertie Moores MD Electronically signed by Mertie Moores MD Signature Date/Time: 08/24/2019/3:26:02 PM    Final     Anti-infectives: Anti-infectives (From admission, onward)   None       Assessment/Plan PVD with left and right BKA's -She does now have bilateral prosthesis/and does walk - Plavix -LD 08/23/19 Hypertension Hx of uterine cancer with hysterctomy Malnutrition -prealbumin 5.4 this AM, start TPN Left pleural effusion/LLL atelectasis  Constipation/abdominal distension Sigmoid colon mass with LBO, small bowel with thickening and liver mass - CEA 89.4 - s/p flex sig 4/15, mass biopsied and tattooed, stent was not able to be placed at that time - would not advance past CLD,  recommend daily miralax/colace and suppository prn  - if patient becomes more distended and nauseated over the weekend would recommend re-attempt at stent across obstructed area if possible to decompress and NGT placement with NPO - will plan for OR, likely Monday or early next week pending OR availability for sigmoid colon resection   FEN: IV fluids/CLD; start TPN  ID: None DVT: SCD's; she can have DVT prophylaxis from our standpoint Follow up: TBD  LOS: 3 days    Norm Parcel , [facility] 08/26/2019, 9:53 AM Please see Amion for pager number during day hours 7:00am-4:30pm

## 2019-08-27 LAB — MAGNESIUM: Magnesium: 1.9 mg/dL (ref 1.7–2.4)

## 2019-08-27 LAB — GLUCOSE, CAPILLARY
Glucose-Capillary: 124 mg/dL — ABNORMAL HIGH (ref 70–99)
Glucose-Capillary: 143 mg/dL — ABNORMAL HIGH (ref 70–99)
Glucose-Capillary: 146 mg/dL — ABNORMAL HIGH (ref 70–99)
Glucose-Capillary: 98 mg/dL (ref 70–99)

## 2019-08-27 LAB — COMPREHENSIVE METABOLIC PANEL
ALT: 16 U/L (ref 0–44)
AST: 24 U/L (ref 15–41)
Albumin: 2.5 g/dL — ABNORMAL LOW (ref 3.5–5.0)
Alkaline Phosphatase: 95 U/L (ref 38–126)
Anion gap: 6 (ref 5–15)
BUN: 10 mg/dL (ref 8–23)
CO2: 25 mmol/L (ref 22–32)
Calcium: 8.1 mg/dL — ABNORMAL LOW (ref 8.9–10.3)
Chloride: 106 mmol/L (ref 98–111)
Creatinine, Ser: 0.3 mg/dL — ABNORMAL LOW (ref 0.44–1.00)
GFR calc Af Amer: >60 mL/min (ref >60)
GFR calc non Af Amer: >60 mL/min (ref >60)
Glucose, Bld: 137 mg/dL — ABNORMAL HIGH (ref 70–99)
Potassium: 3.5 mmol/L (ref 3.5–5.1)
Sodium: 137 mmol/L (ref 135–145)
Total Bilirubin: 0.8 mg/dL (ref 0.3–1.2)
Total Protein: 5.1 g/dL — ABNORMAL LOW (ref 6.5–8.1)

## 2019-08-27 LAB — DIFFERENTIAL
Abs Immature Granulocytes: 0.02 10*3/uL (ref 0.00–0.07)
Basophils Absolute: 0 10*3/uL (ref 0.0–0.1)
Basophils Relative: 0 %
Eosinophils Absolute: 0.1 10*3/uL (ref 0.0–0.5)
Eosinophils Relative: 1 %
Immature Granulocytes: 0 %
Lymphocytes Relative: 15 %
Lymphs Abs: 1.1 10*3/uL (ref 0.7–4.0)
Monocytes Absolute: 0.8 10*3/uL (ref 0.1–1.0)
Monocytes Relative: 12 %
Neutro Abs: 5.2 10*3/uL (ref 1.7–7.7)
Neutrophils Relative %: 72 %

## 2019-08-27 LAB — CBC
HCT: 34 % — ABNORMAL LOW (ref 36.0–46.0)
Hemoglobin: 10.9 g/dL — ABNORMAL LOW (ref 12.0–15.0)
MCH: 29.8 pg (ref 26.0–34.0)
MCHC: 32.1 g/dL (ref 30.0–36.0)
MCV: 92.9 fL (ref 80.0–100.0)
Platelets: 300 10*3/uL (ref 150–400)
RBC: 3.66 MIL/uL — ABNORMAL LOW (ref 3.87–5.11)
RDW: 14.7 % (ref 11.5–15.5)
WBC: 7.2 10*3/uL (ref 4.0–10.5)
nRBC: 0 % (ref 0.0–0.2)

## 2019-08-27 LAB — TRIGLYCERIDES: Triglycerides: 49 mg/dL (ref <150)

## 2019-08-27 LAB — PHOSPHORUS: Phosphorus: 2.7 mg/dL (ref 2.5–4.6)

## 2019-08-27 MED ORDER — NAPHAZOLINE-GLYCERIN 0.012-0.2 % OP SOLN
1.0000 [drp] | Freq: Four times a day (QID) | OPHTHALMIC | Status: DC | PRN
  Administered 2019-08-28 – 2019-09-04 (×4): 2 [drp] via OPHTHALMIC
  Filled 2019-08-27: qty 15

## 2019-08-27 MED ORDER — MAGNESIUM SULFATE IN D5W 1-5 GM/100ML-% IV SOLN
1.0000 g | Freq: Once | INTRAVENOUS | Status: AC
  Administered 2019-08-27: 1 g via INTRAVENOUS
  Filled 2019-08-27: qty 100

## 2019-08-27 MED ORDER — PANTOPRAZOLE SODIUM 40 MG IV SOLR
40.0000 mg | Freq: Every day | INTRAVENOUS | Status: DC
  Administered 2019-08-27 – 2019-09-02 (×7): 40 mg via INTRAVENOUS
  Filled 2019-08-27 (×8): qty 40

## 2019-08-27 MED ORDER — LACTATED RINGERS IV SOLN: INTRAVENOUS | Status: DC

## 2019-08-27 MED ORDER — TRAVASOL 10 % IV SOLN
INTRAVENOUS | Status: AC
  Filled 2019-08-27: qty 600

## 2019-08-27 MED ORDER — HALOPERIDOL LACTATE 5 MG/ML IJ SOLN
5.0000 mg | Freq: Once | INTRAMUSCULAR | Status: AC
  Administered 2019-08-27: 5 mg via INTRAVENOUS
  Filled 2019-08-27: qty 1

## 2019-08-27 MED ORDER — POTASSIUM CHLORIDE 10 MEQ/100ML IV SOLN
10.0000 meq | INTRAVENOUS | Status: AC
  Administered 2019-08-27 (×4): 10 meq via INTRAVENOUS
  Filled 2019-08-27 (×4): qty 100

## 2019-08-27 NOTE — Progress Notes (Signed)
PHARMACY - TOTAL PARENTERAL NUTRITION CONSULT NOTE   Indication: sigmoid colon mass with large bowel obstruction  Patient Measurements: Weight: 59.1 kg (130 lb 4.7 oz)   Body mass index is 20.41 kg/m.   Assessment: 86 yo F with sigmoid colon mass causing large bowel obstruction, small bowel with thickening and liver mass.  Planning for sigmoid colon resection early next week.   Glucose / Insulin: No hx of diabetes. 2 units SSI given since initiation of TPN. CBGs 143-146.  Electrolytes: WNL except K+ at 3.5, Mag at 1.9 (goal K+ > 4, Mag  > 2). Corrected Calcium 9.3.  Renal: SCr low, at baseline LFTs / TGs: LFTs WNL, Triglycerides 49 Prealbumin / albumin: Low- 5.4 (4/16)/ 2.5 Intake / Output; MIVF:  I/O not adequately recorded. LR @ 44ml/hr GI Imaging: 4/13 CT abdomen: infiltrating sigmoid colon mass suspicious for colon CA resulting in obstruction.  Surgeries / Procedures:  4/15 s/p flex sig  Central access: PICC line placed 08/26/19 TPN start date: 08/26/19  Nutritional Goals (per RD recommendation on 08/26/19): kCal: 1750-1950, Protein: 86-100 g/day, Fluid: >1.7L/day  Goal TPN rate is 75 mL/hr (provides 90 g of protein and 1818 kCal per day)  Current Nutrition:  -Clear liquids started 4/15 --> now NPO  -Boost Breeze TID ordered to start 4/16, stopped 4/17 due to emesis and epigastric pain with protein shakes -TPN started 4/16  Plan:  Now:  KCl 69mEq IV x 4 runs Magnesium Sulfate 1g IV x 1   At 1800: Increase TPN rate to 50 mL/hr with 15% dextrose, 30g/L lipids, 50g/L protein. Electrolytes in TPN: 51mEq/L of Na, 68mEq/L of K, 70mEq/L of Ca, 66mEq/L of Mag, and 34mmol/L of Phos. Cl:Ac ratio 1:1.  Add standard MVI and trace elements to TPN.  Continue Sensitive q6h SSI and adjust as needed.  Reduce MIVF to 25 mL/hr at 1800.  Monitor TPN labs on Mon/Thurs.  CMET, Mag, Phos in AM.      Lindell Spar, PharmD, BCPS Clinical Pharmacist  08/27/2019,9:24 AM

## 2019-08-27 NOTE — Progress Notes (Signed)
2 Days Post-Op   Subjective/Chief Complaint: Patient reports emesis and burning epigastric pain with protein shakes   Objective: Vital signs in last 24 hours: Temp:  [97.7 F (36.5 C)-98.4 F (36.9 C)] 98.1 F (36.7 C) (04/17 0504) Pulse Rate:  [68-76] 68 (04/17 0504) Resp:  [16-18] 18 (04/17 0504) BP: (119-139)/(80-84) 139/84 (04/17 0504) SpO2:  [94 %-99 %] 94 % (04/17 0504) Weight:  [59.1 kg] 59.1 kg (04/17 0500) Last BM Date: 08/25/19(per dayshift RN)  Intake/Output from previous day: 04/16 0701 - 04/17 0700 In: 10 [I.V.:10] Out: 400 [Urine:400] Intake/Output this shift: No intake/output data recorded.  Exam: Awake and alert Abdomen distended, non-tender  Lab Results:  Recent Labs    08/26/19 0532 08/27/19 0410  WBC 5.9 7.2  HGB 10.4* 10.9*  HCT 32.9* 34.0*  PLT 286 300   BMET Recent Labs    08/26/19 0532 08/27/19 0410  NA 137 137  K 3.5 3.5  CL 108 106  CO2 19* 25  GLUCOSE 96 137*  BUN 11 10  CREATININE 0.32* 0.30*  CALCIUM 8.1* 8.1*   PT/INR Recent Labs    08/26/19 0532  LABPROT 15.1  INR 1.2   ABG No results for input(s): PHART, HCO3 in the last 72 hours.  Invalid input(s): PCO2, PO2  Studies/Results: [place] EKG SITE RITE  Result Date: 08/26/2019 If Site Rite image not attached, placement could not be confirmed due to current cardiac rhythm.   Anti-infectives: Anti-infectives (From admission, onward)   None      Assessment/Plan: s/p Procedure(s) with comments: FLEXIBLE SIGMOIDOSCOPY (N/A) BIOPSY - sigmoid SUBMUCOSAL INJECTION   Constipation/abdominal distension Sigmoid colon mass with LBO, small bowel with thickening and liver mass  Path does show adenoca She is not tolerating the oral shakes.  Will d/c and continue TNA. Plan surgery Monday or Tuesday for exp lap and resection, possible ostomy   LOS: 4 days    Coralie Keens 08/27/2019

## 2019-08-27 NOTE — Progress Notes (Signed)
PROGRESS NOTE    [name]  [id number] DOB: 12/17/1931 DOA: 08/23/2019 PCP: [name], MD  Brief Narrative:Ms. [name] has history of PAD s/p B/L BKA for which she takes Plavix daily. Followed with Dr. Oneida Alar. She was admitted 4/13 with large bowel obstruction. CT showed sigmoid colon mass with distal colonic obstruction with metastases to the liver. Surgery following.  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 IV fluids and supportive care -Flex sig 4/15 with malignant partially obstructing tumor in the sigmoid colon, biopsied, pathology with adenocarcinoma -General surgery following, plan for surgery early next week -Started TPN  Asymptomatic pyuria -No indication for antibiotics no symptoms, monitor at this time  Dehydration -Resolved with IV fluids and TPN  History of PAD -Bilateral BKA -Hold Plavix  Essential hypertension -Lisinopril on hold  GERD -Continue PPI  DVT prophylaxis: SCDs  code Status: DNR Family Communication: No family at bedside, discussed plan with patient in detail Disposition Plan: Patient is from home, plan for discharge home pending work-up and treatment, surgery for large obstructing colon mass, date of discharge unknown    Consultants:   General surgery  [facility]   Procedures: Flex sig 4/15 with malignant partially obstructing tumor in the sigmoid colon  Antimicrobials:    Subjective: -Had nausea and increased abdominal bloating this morning -No vomiting, positive flatus  Objective: Vitals:   08/26/19 1440 08/26/19 2116 08/27/19 0500 08/27/19 0504  BP: 119/82 135/80  139/84  Pulse: 76 68  68  Resp: 18 16  18   Temp: 98.4 F (36.9 C) 97.7 F (36.5 C)  98.1 F (36.7 C)  TempSrc: Oral Oral    SpO2: 99% 97%  94%  Weight:   59.1 kg     Intake/Output Summary (Last 24  hours) at 08/27/2019 1253 Last data filed at 08/27/2019 0600 Gross per 24 hour  Intake 10 ml  Output 200 ml  Net -190 ml   Filed Weights   08/26/19 0538 08/27/19 0500  Weight: 57 kg 59.1 kg    Examination:  Gen: Pleasant elderly female, sitting up in bed, AAOx3, no distress HEENT:  no JVD Lungs: Good air movement bilaterally, CTAB CVS: S1-S2, regular rate rhythm Abd: Soft, distended abdomen, nontender, bowel sounds diminished but present Extremities: Bilateral BKA Skin: no new rashes Psychiatry:  Mood & affect appropriate.     Data Reviewed:   CBC: Recent Labs  Lab 08/23/19 1404 08/24/19 0450 08/25/19 0558 08/26/19 0532 08/27/19 0410  WBC 8.2 7.7 5.6 5.9 7.2  NEUTROABS 5.8  --   --   --  5.2  HGB 11.9* 11.7* 10.9* 10.4* 10.9*  HCT 36.5 36.4 33.6* 32.9* 34.0*  MCV 91.7 91.5 91.3 94.0 92.9  PLT 368 377 300 286 [id number]   Basic Metabolic Panel: Recent Labs  Lab 08/23/19 1404 08/24/19 0450 08/25/19 0558 08/26/19 0532 08/27/19 0410  NA 136 136 139 137 137  K 3.9 3.7 3.6 3.5 3.5  CL 101 102 103 108 106  CO2 25 21* 20* 19* 25  GLUCOSE 117* 88 67* 96 137*  BUN 26* 25* 21 11 10   CREATININE 0.45 0.43* 0.47 0.32* 0.30*  CALCIUM 9.0 8.7* 8.4* 8.1* 8.1*  MG  --  2.2  --  1.9 1.9  PHOS  --   --   --  2.9 2.7   GFR: CrCl cannot be calculated (Unknown ideal weight.). Liver  Function Tests: Recent Labs  Lab 08/23/19 1404 08/24/19 0450 08/25/19 0558 08/27/19 0410  AST 21 26 27 24   ALT 15 16 16 16   ALKPHOS 121 114 94 95  BILITOT 0.5 0.7 1.0 0.8  PROT 6.5 6.4* 5.5* 5.1*  ALBUMIN 3.3* 3.4* 2.8* 2.5*   Recent Labs  Lab 08/23/19 1404  LIPASE 18   No results for input(s): AMMONIA in the last 168 hours. Coagulation Profile: Recent Labs  Lab 08/24/19 0450 08/26/19 0532  INR 1.1 1.2   Cardiac Enzymes: No results for input(s): CKTOTAL, CKMB, CKMBINDEX, TROPONINI in the last 168 hours. BNP (last 3 results) No results for input(s): PROBNP in the last 8760  hours. HbA1C: No results for input(s): HGBA1C in the last 72 hours. CBG: Recent Labs  Lab 08/26/19 2359 08/27/19 0615 08/27/19 1159  GLUCAP 146* 143* 124*   Lipid Profile: Recent Labs    08/27/19 0410  TRIG 49   Thyroid Function Tests: No results for input(s): TSH, T4TOTAL, FREET4, T3FREE, THYROIDAB in the last 72 hours. Anemia Panel: No results for input(s): VITAMINB12, FOLATE, FERRITIN, TIBC, IRON, RETICCTPCT in the last 72 hours. Urine analysis:    Component Value Date/Time   COLORURINE YELLOW 08/23/2019 1419   APPEARANCEUR HAZY (A) 08/23/2019 1419   LABSPEC 1.023 08/23/2019 1419   PHURINE 5.0 08/23/2019 1419   GLUCOSEU NEGATIVE 08/23/2019 1419   HGBUR NEGATIVE 08/23/2019 1419   BILIRUBINUR NEGATIVE 08/23/2019 1419   KETONESUR 5 (A) 08/23/2019 1419   PROTEINUR NEGATIVE 08/23/2019 1419   NITRITE NEGATIVE 08/23/2019 1419   LEUKOCYTESUR SMALL (A) 08/23/2019 1419   Sepsis Labs: @LABRCNTIP (procalcitonin:4,lacticidven:4)  ) Recent Results (from the past 240 hour(s))  Respiratory Panel by RT PCR (Flu A&B, Covid) - Nasopharyngeal Swab     Status: None   Collection Time: 08/23/19  6:35 PM   Specimen: Nasopharyngeal Swab  Result Value Ref Range Status   SARS Coronavirus 2 by RT PCR NEGATIVE NEGATIVE Final    Comment: (NOTE) SARS-CoV-2 target nucleic acids are NOT DETECTED. The SARS-CoV-2 RNA is generally detectable in upper respiratoy specimens during the acute phase of infection. The lowest concentration of SARS-CoV-2 viral copies this assay can detect is 131 copies/mL. A negative result does not preclude SARS-Cov-2 infection and should not be used as the sole basis for treatment or other patient management decisions. A negative result may occur with  improper specimen collection/handling, submission of specimen other than nasopharyngeal swab, presence of viral mutation(s) within the areas targeted by this assay, and inadequate number of viral copies (<131  copies/mL). A negative result must be combined with clinical observations, patient history, and epidemiological information. The expected result is Negative. Fact Sheet for Patients:  PinkCheek.be Fact Sheet for Healthcare Providers:  GravelBags.it This test is not yet ap proved or cleared by the [place] FDA and  has been authorized for detection and/or diagnosis of SARS-CoV-2 by FDA under an Emergency Use Authorization (EUA). This EUA will remain  in effect (meaning this test can be used) for the duration of the COVID-19 declaration under Section 564(b)(1) of the Act, 21 U.S.C. section 360bbb-3(b)(1), unless the authorization is terminated or revoked sooner.    Influenza A by PCR NEGATIVE NEGATIVE Final   Influenza B by PCR NEGATIVE NEGATIVE Final    Comment: (NOTE) The Xpert Xpress SARS-CoV-2/FLU/RSV assay is intended as an aid in  the diagnosis of influenza from Nasopharyngeal swab specimens and  should not be used as a sole basis for treatment. Nasal washings and  aspirates are unacceptable for Xpert Xpress SARS-CoV-2/FLU/RSV  testing. Fact Sheet for Patients: PinkCheek.be Fact Sheet for Healthcare Providers: GravelBags.it This test is not yet approved or cleared by the [place] FDA and  has been authorized for detection and/or diagnosis of SARS-CoV-2 by  FDA under an Emergency Use Authorization (EUA). This EUA will remain  in effect (meaning this test can be used) for the duration of the  Covid-19 declaration under Section 564(b)(1) of the Act, 21  U.S.C. section 360bbb-3(b)(1), unless the authorization is  terminated or revoked. Performed at [facility], [place] [address], [place], [place] [address]          Radiology Studies: [place] EKG SITE RITE  Result Date: 08/26/2019 If Site Rite image not attached, placement could not be  confirmed due to current cardiac rhythm.    Scheduled Meds: . Chlorhexidine Gluconate Cloth  6 each Topical Daily  . docusate sodium  100 mg Oral BID  . enoxaparin (LOVENOX) injection  40 mg Subcutaneous Q24H  . insulin aspart  0-9 Units Subcutaneous Q6H  . polyethylene glycol  17 g Oral BID  . sodium chloride flush  10-40 mL Intracatheter Q12H  . sodium chloride flush  3 mL Intravenous Q12H   Continuous Infusions: . lactated ringers 45 mL/hr at 08/26/19 2343  . lactated ringers    . magnesium sulfate bolus IVPB    . potassium chloride 10 mEq (08/27/19 1143)  . TPN ADULT (ION) 30 mL/hr at 08/26/19 1806  . TPN ADULT (ION)       LOS: 4 days    Time spent: 80min  Domenic Polite, MD [facility]  08/27/2019, 12:53 PM

## 2019-08-27 NOTE — Progress Notes (Signed)
Patient remains NPO per surgeon's order. Patient has not had any urinary output this shift despite IVF and TPN infusing via PICC. Bladder scan - 150 ml. Attending physician notified. LR IVF increased to 75 ml per hour. If no urinary output by 1700, another bladder scan will be completed per physician order. Charge nurse made aware.

## 2019-08-28 ENCOUNTER — Inpatient Hospital Stay (HOSPITAL_COMMUNITY): Payer: Medicare Other

## 2019-08-28 LAB — COMPREHENSIVE METABOLIC PANEL
ALT: 17 U/L (ref 0–44)
AST: 26 U/L (ref 15–41)
Albumin: 2.5 g/dL — ABNORMAL LOW (ref 3.5–5.0)
Alkaline Phosphatase: 87 U/L (ref 38–126)
Anion gap: 7 (ref 5–15)
BUN: 10 mg/dL (ref 8–23)
CO2: 24 mmol/L (ref 22–32)
Calcium: 7.8 mg/dL — ABNORMAL LOW (ref 8.9–10.3)
Chloride: 105 mmol/L (ref 98–111)
Creatinine, Ser: 0.35 mg/dL — ABNORMAL LOW (ref 0.44–1.00)
GFR calc Af Amer: >60 mL/min (ref >60)
GFR calc non Af Amer: >60 mL/min (ref >60)
Glucose, Bld: 118 mg/dL — ABNORMAL HIGH (ref 70–99)
Potassium: 4 mmol/L (ref 3.5–5.1)
Sodium: 136 mmol/L (ref 135–145)
Total Bilirubin: 0.4 mg/dL (ref 0.3–1.2)
Total Protein: 5.1 g/dL — ABNORMAL LOW (ref 6.5–8.1)

## 2019-08-28 LAB — URINALYSIS, ROUTINE W REFLEX MICROSCOPIC
Bilirubin Urine: NEGATIVE
Glucose, UA: NEGATIVE mg/dL
Hgb urine dipstick: NEGATIVE
Ketones, ur: NEGATIVE mg/dL
Leukocytes,Ua: NEGATIVE
Nitrite: NEGATIVE
Protein, ur: NEGATIVE mg/dL
Specific Gravity, Urine: 1.01 (ref 1.005–1.030)
pH: 6.5 (ref 5.0–8.0)

## 2019-08-28 LAB — PHOSPHORUS: Phosphorus: 3 mg/dL (ref 2.5–4.6)

## 2019-08-28 LAB — BLOOD GAS, ARTERIAL
Acid-Base Excess: 1.9 mmol/L (ref 0.0–2.0)
Bicarbonate: 24.4 mmol/L (ref 20.0–28.0)
O2 Saturation: 95.3 %
Patient temperature: 98.6
pCO2 arterial: 32.3 mmHg (ref 32.0–48.0)
pH, Arterial: 7.491 — ABNORMAL HIGH (ref 7.350–7.450)
pO2, Arterial: 73.2 mmHg — ABNORMAL LOW (ref 83.0–108.0)

## 2019-08-28 LAB — MAGNESIUM: Magnesium: 2 mg/dL (ref 1.7–2.4)

## 2019-08-28 LAB — CBC
HCT: 33.9 % — ABNORMAL LOW (ref 36.0–46.0)
Hemoglobin: 11 g/dL — ABNORMAL LOW (ref 12.0–15.0)
MCH: 30.1 pg (ref 26.0–34.0)
MCHC: 32.4 g/dL (ref 30.0–36.0)
MCV: 92.6 fL (ref 80.0–100.0)
Platelets: 297 10*3/uL (ref 150–400)
RBC: 3.66 MIL/uL — ABNORMAL LOW (ref 3.87–5.11)
RDW: 14.6 % (ref 11.5–15.5)
WBC: 7 10*3/uL (ref 4.0–10.5)
nRBC: 0 % (ref 0.0–0.2)

## 2019-08-28 LAB — GLUCOSE, CAPILLARY
Glucose-Capillary: 119 mg/dL — ABNORMAL HIGH (ref 70–99)
Glucose-Capillary: 124 mg/dL — ABNORMAL HIGH (ref 70–99)
Glucose-Capillary: 127 mg/dL — ABNORMAL HIGH (ref 70–99)

## 2019-08-28 MED ORDER — MORPHINE SULFATE (PF) 2 MG/ML IV SOLN: 1.0000 mg | INTRAVENOUS | Status: DC | PRN

## 2019-08-28 MED ORDER — TRAVASOL 10 % IV SOLN
INTRAVENOUS | Status: AC
  Filled 2019-08-28: qty 900

## 2019-08-28 MED ORDER — LIP MEDEX EX OINT
TOPICAL_OINTMENT | CUTANEOUS | Status: AC
  Filled 2019-08-28: qty 7

## 2019-08-28 MED ORDER — DICLOFENAC SODIUM 1 % EX GEL
2.0000 g | Freq: Four times a day (QID) | CUTANEOUS | Status: DC
  Administered 2019-08-28 – 2019-09-05 (×25): 2 g via TOPICAL
  Filled 2019-08-28: qty 100

## 2019-08-28 MED ORDER — NICOTINE 14 MG/24HR TD PT24
14.0000 mg | MEDICATED_PATCH | Freq: Every day | TRANSDERMAL | Status: DC
  Administered 2019-08-28 – 2019-09-05 (×9): 14 mg via TRANSDERMAL
  Filled 2019-08-28 (×9): qty 1

## 2019-08-28 NOTE — Progress Notes (Signed)
PROGRESS NOTE    [name]  [id number] DOB: 1932-04-24 DOA: 08/23/2019 PCP: Gail Mclean, MD  Brief Narrative:[name] has history of PAD s/p B/L BKA for which she takes Plavix daily. Followed with Dr. Oneida Alar. She was admitted 4/13 with large bowel obstruction. CT showed sigmoid colon mass with distal colonic obstruction with metastases to the liver. Surgery following.  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 IV fluids and supportive care -Flex sig 4/15 with malignant partially obstructing tumor in the sigmoid colon, biopsied, pathology with adenocarcinoma -General surgery following, plan for surgery early next week -Started TPN 4/17 -Discontinue IV fluids  Delirium, agitation -Suspect hospital delirium, worsened by narcotics -Cannot rule out  seizure-like episode early this morning, ABG without hypercarbia, check CT head, urinalysis -Discontinued narcotics, avoid antipsychotics, Haldol -Monitor  Dehydration -Resolved with IV fluids and TPN  History of PAD -Bilateral BKA -Hold Plavix  Essential hypertension -Lisinopril on hold  GERD -Continue PPI  DVT prophylaxis: SCDs  code Status: DNR Family Communication: No family at bedside, d/w grandson Hydrologist Disposition Plan: Patient is from home, plan for discharge home pending work-up and treatment, surgery for large obstructing colon mass, improvement in mental status, agitation, date of discharge unknown    Consultants:   General surgery  [facility]   Procedures: Flex sig 4/15 with malignant partially obstructing tumor in the sigmoid colon  Antimicrobials:    Subjective: -Overnight with some confusion, Haldol x1 for agitation -early am with episode of decreased responsiveness, stiffness and questionable shaking of her arms night RN  Objective: Vitals:    08/27/19 1419 08/27/19 1950 08/28/19 0546 08/28/19 1213  BP: (!) 149/81 133/83 126/84 133/79  Pulse: 75 98 72 83  Resp: 15 16 18  (!) 22  Temp: 98.9 F (37.2 C) 98.4 F (36.9 C) 98 F (36.7 C) (!) 97.5 F (36.4 C)  TempSrc: Oral Oral Oral Oral  SpO2: 94% 93% 100% 97%  Weight:   60.1 kg   Height:        Intake/Output Summary (Last 24 hours) at 08/28/2019 1351 Last data filed at 08/28/2019 1047 Gross per 24 hour  Intake 3480.72 ml  Output 500 ml  Net 2980.72 ml   Filed Weights   08/27/19 0500 08/27/19 1327 08/28/19 0546  Weight: 59.1 kg 59.1 kg 60.1 kg    Examination:  Gen: Elderly frail chronically ill female, slightly confused this morning, arousable, oriented to self only HEENT: no JVD Lungs: Clear anteriorly CVS: S1S2/RRR Abd: Soft, mildly distended, nontender, bowel sounds present but diminished Extremities: Bilateral BKA Skin: no new rashes Psychiatry: Unable to assess   Data Reviewed:   CBC: Recent Labs  Lab 08/23/19 1404 08/23/19 1404 08/24/19 0450 08/25/19 0558 08/26/19 0532 08/27/19 0410 08/28/19 0934  WBC 8.2   < > 7.7 5.6 5.9 7.2 7.0  NEUTROABS 5.8  --   --   --   --  5.2  --   HGB 11.9*   < > 11.7* 10.9* 10.4* 10.9* 11.0*  HCT 36.5   < > 36.4 33.6* 32.9* 34.0* 33.9*  MCV 91.7   < > 91.5 91.3 94.0 92.9 92.6  PLT 368   < > 377 300 286 300 297   < > = values in this interval not displayed.   Basic Metabolic Panel: Recent Labs  Lab 08/24/19 0450 08/25/19 [id number] 08/26/19 0532 08/27/19 0410 08/28/19 0146  NA 136 139 137 137 136  K 3.7 3.6 3.5 3.5 4.0  CL 102 103 108 106 105  CO2 21* 20* 19* 25 24  GLUCOSE 88 67* 96 137* 118*  BUN 25* 21 11 10 10   CREATININE 0.43* 0.47 0.32* 0.30* 0.35*  CALCIUM 8.7* 8.4* 8.1* 8.1* 7.8*  MG 2.2  --  1.9 1.9 2.0  PHOS  --   --  2.9 2.7 3.0   GFR: Estimated Creatinine Clearance: 31.1 mL/min (A) (by C-G formula based on SCr of 0.35 mg/dL (L)). Liver Function Tests: Recent Labs  Lab 08/23/19 1404  08/24/19 0450 08/25/19 0558 08/27/19 0410 08/28/19 0146  AST 21 26 27 24 26   ALT 15 16 16 16 17   ALKPHOS 121 114 94 95 87  BILITOT 0.5 0.7 1.0 0.8 0.4  PROT 6.5 6.4* 5.5* 5.1* 5.1*  ALBUMIN 3.3* 3.4* 2.8* 2.5* 2.5*   Recent Labs  Lab 08/23/19 1404  LIPASE 18   No results for input(s): AMMONIA in the last 168 hours. Coagulation Profile: Recent Labs  Lab 08/24/19 0450 08/26/19 0532  INR 1.1 1.2   Cardiac Enzymes: No results for input(s): CKTOTAL, CKMB, CKMBINDEX, TROPONINI in the last 168 hours. BNP (last 3 results) No results for input(s): PROBNP in the last 8760 hours. HbA1C: No results for input(s): HGBA1C in the last 72 hours. CBG: Recent Labs  Lab 08/27/19 0615 08/27/19 1159 08/27/19 1725 08/28/19 0007 08/28/19 1211  GLUCAP 143* 124* 98 124* 127*   Lipid Profile: Recent Labs    08/27/19 0410  TRIG 49   Thyroid Function Tests: No results for input(s): TSH, T4TOTAL, FREET4, T3FREE, THYROIDAB in the last 72 hours. Anemia Panel: No results for input(s): VITAMINB12, FOLATE, FERRITIN, TIBC, IRON, RETICCTPCT in the last 72 hours. Urine analysis:    Component Value Date/Time   COLORURINE YELLOW 08/23/2019 1419   APPEARANCEUR HAZY (A) 08/23/2019 1419   LABSPEC 1.023 08/23/2019 1419   PHURINE 5.0 08/23/2019 1419   GLUCOSEU NEGATIVE 08/23/2019 1419   HGBUR NEGATIVE 08/23/2019 1419   BILIRUBINUR NEGATIVE 08/23/2019 1419   KETONESUR 5 (A) 08/23/2019 1419   PROTEINUR NEGATIVE 08/23/2019 1419   NITRITE NEGATIVE 08/23/2019 1419   LEUKOCYTESUR SMALL (A) 08/23/2019 1419   Sepsis Labs: @LABRCNTIP (procalcitonin:4,lacticidven:4)  ) Recent Results (from the past 240 hour(s))  Respiratory Panel by RT PCR (Flu A&B, Covid) - Nasopharyngeal Swab     Status: None   Collection Time: 08/23/19  6:35 PM   Specimen: Nasopharyngeal Swab  Result Value Ref Range Status   SARS Coronavirus 2 by RT PCR NEGATIVE NEGATIVE Final    Comment: (NOTE) SARS-CoV-2 target nucleic  acids are NOT DETECTED. The SARS-CoV-2 RNA is generally detectable in upper respiratoy specimens during the acute phase of infection. The lowest concentration of SARS-CoV-2 viral copies this assay can detect is 131 copies/mL. A negative result does not preclude SARS-Cov-2 infection and should not be used as the sole basis for treatment or other patient management decisions. A negative result may occur with  improper specimen collection/handling, submission of specimen other than nasopharyngeal swab, presence of viral mutation(s) within the areas targeted by this assay, and inadequate number of viral copies (<131 copies/mL). A negative result must be combined with clinical observations, patient history, and epidemiological information. The expected result is Negative. Fact Sheet for Patients:  PinkCheek.be Fact Sheet for Healthcare Providers:  GravelBags.it This test is not yet ap proved or cleared by the [place] FDA and  has been authorized for detection  and/or diagnosis of SARS-CoV-2 by FDA under an Emergency Use Authorization (EUA). This EUA will remain  in effect (meaning this test can be used) for the duration of the COVID-19 declaration under Section 564(b)(1) of the Act, 21 U.S.C. section 360bbb-3(b)(1), unless the authorization is terminated or revoked sooner.    Influenza A by PCR NEGATIVE NEGATIVE Final   Influenza B by PCR NEGATIVE NEGATIVE Final    Comment: (NOTE) The Xpert Xpress SARS-CoV-2/FLU/RSV assay is intended as an aid in  the diagnosis of influenza from Nasopharyngeal swab specimens and  should not be used as a sole basis for treatment. Nasal washings and  aspirates are unacceptable for Xpert Xpress SARS-CoV-2/FLU/RSV  testing. Fact Sheet for Patients: PinkCheek.be Fact Sheet for Healthcare Providers: GravelBags.it This test is not yet  approved or cleared by the [place] FDA and  has been authorized for detection and/or diagnosis of SARS-CoV-2 by  FDA under an Emergency Use Authorization (EUA). This EUA will remain  in effect (meaning this test can be used) for the duration of the  Covid-19 declaration under Section 564(b)(1) of the Act, 21  U.S.C. section 360bbb-3(b)(1), unless the authorization is  terminated or revoked. Performed at [facility], [place] [address], [place], [place] [address]          Radiology Studies: No results found.   Scheduled Meds: . Chlorhexidine Gluconate Cloth  6 each Topical Daily  . diclofenac Sodium  2 g Topical QID  . enoxaparin (LOVENOX) injection  40 mg Subcutaneous Q24H  . insulin aspart  0-9 Units Subcutaneous Q6H  . nicotine  14 mg Transdermal Daily  . pantoprazole (PROTONIX) IV  40 mg Intravenous Daily  . sodium chloride flush  10-40 mL Intracatheter Q12H  . sodium chloride flush  3 mL Intravenous Q12H   Continuous Infusions: . TPN ADULT (ION) 50 mL/hr at 08/27/19 1834  . TPN ADULT (ION)       LOS: 5 days    Time spent: 17min  Domenic Polite, MD [facility]  08/28/2019, 1:51 PM
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
## 2019-08-28 NOTE — Progress Notes (Addendum)
3 Days Post-Op   Subjective/Chief Complaint: No emesis since NPO No documented BMs   Objective: Vital signs in last 24 hours: Temp:  [98 F (36.7 C)-98.9 F (37.2 C)] 98 F (36.7 C) (04/18 0546) Pulse Rate:  [72-98] 72 (04/18 0546) Resp:  [15-18] 18 (04/18 0546) BP: (126-149)/(81-84) 126/84 (04/18 0546) SpO2:  [93 %-100 %] 100 % (04/18 0546) Weight:  [59.1 kg-60.1 kg] 60.1 kg (04/18 0546) Last BM Date: 08/25/19(per dayshift RN)  Intake/Output from previous day: 04/17 0701 - 04/18 0700 In: 3480.7 [I.V.:3080.7; IV Piggyback:400] Out: 200 [Urine:200] Intake/Output this shift: No intake/output data recorded.  Exam: Abdomen distended, minimally tender, no peritoneal signs  Lab Results:  Recent Labs    08/26/19 0532 08/27/19 0410  WBC 5.9 7.2  HGB 10.4* 10.9*  HCT 32.9* 34.0*  PLT 286 300   BMET Recent Labs    08/27/19 0410 08/28/19 0146  NA 137 136  K 3.5 4.0  CL 106 105  CO2 25 24  GLUCOSE 137* 118*  BUN 10 10  CREATININE 0.30* 0.35*  CALCIUM 8.1* 7.8*   PT/INR Recent Labs    08/26/19 0532  LABPROT 15.1  INR 1.2   ABG No results for input(s): PHART, HCO3 in the last 72 hours.  Invalid input(s): PCO2, PO2  Studies/Results: [place] EKG SITE RITE  Result Date: 08/26/2019 If Site Rite image not attached, placement could not be confirmed due to current cardiac rhythm.   Anti-infectives: Anti-infectives (From admission, onward)   None      Assessment/Plan: s/p Procedure(s) with comments: FLEXIBLE SIGMOIDOSCOPY (N/A) BIOPSY - sigmoid SUBMUCOSAL INJECTION   Constipation/abdominal distension Sigmoid colonmass with LBO, small bowel with thickening and liver mass  Continue NPO and TNA For surgery this week, Monday vs Tuesday depending on OR schedule.  Our main surgeon this week will be Dr. Marlou Starks.   May only be able to divert and may not be able to resect depending on findings intra op   LOS: 5 days    Coralie Keens 08/28/2019

## 2019-08-28 NOTE — Progress Notes (Signed)
PHARMACY - TOTAL PARENTERAL NUTRITION CONSULT NOTE   Indication: sigmoid colon mass with large bowel obstruction  Patient Measurements: Height: 4' 2.5" (128.3 cm) Weight: 60.1 kg (132 lb 7.9 oz) IBW/kg (Calculated) : 23.65 TPN AdjBW (KG): 32.5 Body mass index is 36.53 kg/m.   Assessment: 86 yo F with sigmoid colon mass causing large bowel obstruction, small bowel with thickening and liver mass.  Planning for sigmoid colon resection early next week.   Glucose / Insulin: No hx of diabetes. 2 units SSI in past 24 hours. CBGs 98-124 (note refused CBG this AM).  Electrolytes: WNL (goal K+ =/> 4, Mag =/> 2). Corrected Calcium 9.  Renal: SCr low, at baseline LFTs / TGs: LFTs WNL, Triglycerides 49 Prealbumin / albumin: Low- 5.4 (4/16)/ 2.5 Intake / Output; MIVF:  I/O 3481/200. LR @ 82ml/hr GI Imaging: 4/13 CT abdomen: infiltrating sigmoid colon mass suspicious for colon CA resulting in obstruction.  Surgeries / Procedures:  4/15 s/p flex sig  Central access: PICC line placed 08/26/19 TPN start date: 08/26/19  Nutritional Goals (per RD recommendation on 08/26/19): kCal: 1750-1950, Protein: 86-100 g/day, Fluid: >1.7L/day  Goal TPN rate is 75 mL/hr (provides 90 g of protein and 1818 kCal per day)  Current Nutrition:  -Clear liquids started 4/15 --> now NPO  -Boost Breeze TID ordered to start 4/16, stopped 4/17 due to emesis and epigastric pain with protein shakes -TPN started 4/16  Plan:  At 1800: Increase TPN rate to goal rate of 24mL/hr with 15% dextrose, 30g/L lipids, 50g/L protein. Electrolytes in TPN: 26mEq/L of Na, 28mEq/L of K, 30mEq/L of Ca, 46mEq/L of Mag, and 65mmol/L of Phos. Cl:Ac ratio 1:1.  Add standard MVI and trace elements to TPN.  Continue Sensitive q6h SSI and adjust as needed.  MIVF management per MD (LR increased to 36mL/hr on 4/17 per MD due to low UOP, now d/c'ed).   Monitor TPN labs on Mon/Thurs.    Lindell Spar, PharmD, BCPS Clinical Pharmacist   08/28/2019,8:27 AM

## 2019-08-29 LAB — COMPREHENSIVE METABOLIC PANEL
ALT: 15 U/L (ref 0–44)
AST: 24 U/L (ref 15–41)
Albumin: 2.6 g/dL — ABNORMAL LOW (ref 3.5–5.0)
Alkaline Phosphatase: 87 U/L (ref 38–126)
Anion gap: 8 (ref 5–15)
BUN: 12 mg/dL (ref 8–23)
CO2: 24 mmol/L (ref 22–32)
Calcium: 8.1 mg/dL — ABNORMAL LOW (ref 8.9–10.3)
Chloride: 105 mmol/L (ref 98–111)
Creatinine, Ser: <0.3 mg/dL — ABNORMAL LOW (ref 0.44–1.00)
Glucose, Bld: 95 mg/dL (ref 70–99)
Potassium: 4.2 mmol/L (ref 3.5–5.1)
Sodium: 137 mmol/L (ref 135–145)
Total Bilirubin: 0.3 mg/dL (ref 0.3–1.2)
Total Protein: 5.2 g/dL — ABNORMAL LOW (ref 6.5–8.1)

## 2019-08-29 LAB — PREALBUMIN: Prealbumin: 5.8 mg/dL — ABNORMAL LOW (ref 18–38)

## 2019-08-29 LAB — SURGICAL PATHOLOGY

## 2019-08-29 LAB — URINE CULTURE: Culture: NO GROWTH

## 2019-08-29 LAB — CBC
HCT: 33.8 % — ABNORMAL LOW (ref 36.0–46.0)
Hemoglobin: 10.9 g/dL — ABNORMAL LOW (ref 12.0–15.0)
MCH: 30.1 pg (ref 26.0–34.0)
MCHC: 32.2 g/dL (ref 30.0–36.0)
MCV: 93.4 fL (ref 80.0–100.0)
Platelets: 300 10*3/uL (ref 150–400)
RBC: 3.62 MIL/uL — ABNORMAL LOW (ref 3.87–5.11)
RDW: 14.9 % (ref 11.5–15.5)
WBC: 8.8 10*3/uL (ref 4.0–10.5)
nRBC: 0 % (ref 0.0–0.2)

## 2019-08-29 LAB — MAGNESIUM: Magnesium: 2 mg/dL (ref 1.7–2.4)

## 2019-08-29 LAB — DIFFERENTIAL
Abs Immature Granulocytes: 0.03 10*3/uL (ref 0.00–0.07)
Basophils Absolute: 0 10*3/uL (ref 0.0–0.1)
Basophils Relative: 0 %
Eosinophils Absolute: 0.2 10*3/uL (ref 0.0–0.5)
Eosinophils Relative: 2 %
Immature Granulocytes: 0 %
Lymphocytes Relative: 16 %
Lymphs Abs: 1.4 10*3/uL (ref 0.7–4.0)
Monocytes Absolute: 1 10*3/uL (ref 0.1–1.0)
Monocytes Relative: 11 %
Neutro Abs: 6.2 10*3/uL (ref 1.7–7.7)
Neutrophils Relative %: 71 %

## 2019-08-29 LAB — GLUCOSE, CAPILLARY
Glucose-Capillary: 104 mg/dL — ABNORMAL HIGH (ref 70–99)
Glucose-Capillary: 111 mg/dL — ABNORMAL HIGH (ref 70–99)
Glucose-Capillary: 112 mg/dL — ABNORMAL HIGH (ref 70–99)
Glucose-Capillary: 125 mg/dL — ABNORMAL HIGH (ref 70–99)
Glucose-Capillary: 127 mg/dL — ABNORMAL HIGH (ref 70–99)

## 2019-08-29 LAB — PHOSPHORUS: Phosphorus: 3.5 mg/dL (ref 2.5–4.6)

## 2019-08-29 LAB — TRIGLYCERIDES: Triglycerides: 43 mg/dL (ref <150)

## 2019-08-29 MED ORDER — TRAVASOL 10 % IV SOLN
INTRAVENOUS | Status: AC
  Filled 2019-08-29: qty 900

## 2019-08-29 MED ORDER — SODIUM CHLORIDE 0.9 % IV SOLN
2.0000 g | INTRAVENOUS | Status: AC
  Administered 2019-08-30: 2 g via INTRAVENOUS
  Filled 2019-08-29: qty 2

## 2019-08-29 MED ORDER — GUAIFENESIN-DM 100-10 MG/5ML PO SYRP
5.0000 mL | ORAL_SOLUTION | ORAL | Status: DC | PRN
  Administered 2019-08-29 – 2019-09-02 (×3): 5 mL via ORAL
  Filled 2019-08-29 (×3): qty 10

## 2019-08-29 MED ORDER — ENOXAPARIN SODIUM 40 MG/0.4ML ~~LOC~~ SOLN: 40.0000 mg | SUBCUTANEOUS | Status: DC

## 2019-08-29 NOTE — H&P (View-Only) (Signed)
[facility] Progress Note  4 Days Post-Op  Subjective: CC:  No complaints. Denies abdominal pain. Denies flatus or BM. Denies nausea or vomiting. Reports that she is voiding every hour. Requests that we call her grandson, byron, before surgery.    Objective: Vital signs in last 24 hours: Temp:  [97.5 F (36.4 C)-98.1 F (36.7 C)] 97.7 F (36.5 C) (04/19 0519) Pulse Rate:  [70-91] 78 (04/19 0519) Resp:  [16-22] 16 (04/19 0519) BP: (107-147)/(78-98) 147/98 (04/19 0519) SpO2:  [94 %-97 %] 97 % (04/19 0519) Weight:  [59.8 kg] 59.8 kg (04/19 0519) Last BM Date: 08/25/19(per dayshift RN)  Intake/Output from previous day: 04/18 0701 - 04/19 0700 In: 657.3 [I.V.:657.3] Out: 300 [Urine:300] Intake/Output this shift: No intake/output data recorded.  PE: Gen:  Alert, NAD, pleasant Card:  Regular rate and rhythm, pedal pulses 2+ BL Pulm:  Normal effort, clear to auscultation bilaterally Abd: distended, firm, non-tender, no peritonitis.  Skin: warm and dry, no rashes  Psych: A&Ox3   Lab Results:  Recent Labs    08/28/19 0934 08/29/19 0200  WBC 7.0 8.8  HGB 11.0* 10.9*  HCT 33.9* 33.8*  PLT 297 300   BMET Recent Labs    08/28/19 0146 08/29/19 0200  NA 136 137  K 4.0 4.2  CL 105 105  CO2 24 24  GLUCOSE 118* 95  BUN 10 12  CREATININE 0.35* <0.30*  CALCIUM 7.8* 8.1*   PT/INR No results for input(s): LABPROT, INR in the last 72 hours. CMP     Component Value Date/Time   NA 137 08/29/2019 0200   K 4.2 08/29/2019 0200   CL 105 08/29/2019 0200   CO2 24 08/29/2019 0200   GLUCOSE 95 08/29/2019 0200   BUN 12 08/29/2019 0200   CREATININE <0.30 (L) 08/29/2019 0200   CREATININE 0.44 (L) 04/14/2017 0859   CALCIUM 8.1 (L) 08/29/2019 0200   PROT 5.2 (L) 08/29/2019 0200   ALBUMIN 2.6 (L) 08/29/2019 0200   AST 24 08/29/2019 0200   ALT 15 08/29/2019 0200   ALKPHOS 87 08/29/2019 0200   BILITOT 0.3 08/29/2019 0200   GFRNONAA NOT CALCULATED 08/29/2019 0200    GFRAA NOT CALCULATED 08/29/2019 0200   Lipase     Component Value Date/Time   LIPASE 18 08/23/2019 1404       Studies/Results: CT HEAD WO CONTRAST  Result Date: 08/28/2019 CLINICAL DATA:  Seizure. Abnormal neuro exam. Falls. EXAM: CT HEAD WITHOUT CONTRAST TECHNIQUE: Contiguous axial images were obtained from the base of the skull through the vertex without intravenous contrast. COMPARISON:  None. FINDINGS: Brain: Mild generalized atrophy and moderate diffuse white matter disease is present bilaterally. Basal ganglia are intact. Insular ribbon is normal bilaterally. No acute hemorrhage or mass lesion is present. The ventricles are proportionate to the degree of atrophy. No significant extraaxial fluid collection is present. The brainstem and cerebellum are within normal limits. Vascular: Atherosclerotic changes are present within the cavernous internal carotid arteries bilaterally as well as at the dural margin of the left vertebral artery. No hyperdense vessel is present. Skull: Calvarium is intact. No focal lytic or blastic lesions are present. Sinuses/Orbits: The paranasal sinuses and mastoid air cells are clear. Bilateral lens replacements are noted. Globes and orbits are otherwise unremarkable. IMPRESSION: 1. Mild generalized atrophy and moderate diffuse white matter disease. This likely reflects the sequela of chronic microvascular ischemia. 2. No acute intracranial abnormality or significant interval change. Electronically Signed   By: Wynetta Fines.D.  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 Will discuss  timing of surgery with Dr. Marlou Starks. Patient will require exploratory laparotomy, possible bowel resection, possible ostomy, possible G-tube.   FEN: NPO, TNA  ID: none VTE: SCD's, Lovenox   LOS: 6 days    Obie Dredge, [facility] Surgery Pager: [phone]

## 2019-08-29 NOTE — Care Management Important Message (Signed)
Important Message  Patient Details IM Letter given to Nancy Marus RN Case Manager to present to the Patient Name: [name] MRN: [id number] Date of Birth: 04-28-1932   Medicare Important Message Given:  Yes     Floye, Schnaidt 08/29/2019, 10:24 AM

## 2019-08-29 NOTE — Progress Notes (Signed)
PROGRESS NOTE    [name]  [id number] DOB: 1932-05-07 DOA: 08/23/2019 PCP: Gail Mclean, MD  Brief Narrative:[name] has history of PAD s/p B/L BKA for which she takes Plavix daily. She was admitted 4/13 with large bowel obstruction. CT showed sigmoid colon mass with distal colonic obstruction with metastases to the liver. Surgery following.  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 IV fluids and supportive care -Flex sig 4/15 with malignant partially obstructing tumor in the sigmoid colon, biopsied, pathology with adenocarcinoma -General surgery following, plan for surgery this week -Started TPN 4/17  Delirium, agitation from 4/17-18 -developed hospital delirium, in the setting of IV narcotics, worsened after dose of Haldol for agitation -Now improved, morphine discontinued, CT head unremarkable, urinalysis is benign and ABG without hypercarbia  Dehydration -Resolved with IV fluids and TPN  History of PAD -Bilateral BKA -Hold Plavix  Essential hypertension -Lisinopril on hold  GERD -Continue PPI  DVT prophylaxis: SCDs  code Status: DNR Family Communication: No family at bedside, d/w grandson [name] 4/18, updated Sister at bedside today Disposition Plan:  Status is: Inpatient Dispo: The patient is from: Home              Anticipated d/c is to: Home              Anticipated d/c date is: > 3 days              Patient currently is not medically stable to d/c.  Consultants:   General surgery  [facility]   Procedures: Flex sig 4/15 with malignant partially obstructing tumor in the sigmoid colon  Antimicrobials:    Subjective: -Doing better  overnight, no further issues with delirium or agitation  Objective: Vitals:   08/28/19 1213 08/28/19 1601 08/28/19 2023 08/29/19 0519  BP: 133/79 140/84 107/78 (!) 147/98  Pulse: 83 91 70 78  Resp: (!) 22 20 19 16   Temp: (!) 97.5 F (36.4 C) 98.1 F (36.7 C) 98 F (36.7 C) 97.7 F (36.5 C)  TempSrc: Oral  Oral Oral  SpO2: 97% 94% 96% 97%  Weight:    59.8 kg  Height:        Intake/Output Summary (Last 24 hours) at 08/29/2019 1144 Last data filed at 08/28/2019 1721 Gross per 24 hour  Intake 657.27 ml  Output --  Net 657.27 ml   Filed Weights   08/27/19 1327 08/28/19 0546 08/29/19 0519  Weight: 59.1 kg 60.1 kg 59.8 kg    Examination:  Gen: Elderly female, sitting up in bed, awake alert and much more interactive today, oriented to self and place HEENT: no JVD Lungs clear anteriorly CVS: S1-S2, regular rhythm Abd: Soft, mildly distended, nontender, bowel sounds present but diminished Extremities: Bilateral BKA Skin: no new rashes on exposed skin Psychiatry: Unable to assess   Data Reviewed:   CBC: Recent Labs  Lab 08/23/19 1404 08/24/19 0450 08/25/19 0558 08/26/19 0532 08/27/19 0410 08/28/19 0934 08/29/19 0200  WBC 8.2   < > 5.6 5.9 7.2 7.0 8.8  NEUTROABS 5.8  --   --   --  5.2  --  6.2  HGB 11.9*   < > 10.9* 10.4* 10.9* 11.0* 10.9*  HCT 36.5   < > 33.6* 32.9*  34.0* 33.9* 33.8*  MCV 91.7   < > 91.3 94.0 92.9 92.6 93.4  PLT 368   < > 300 286 300 297 300   < > = values in this interval not displayed.   Basic Metabolic Panel: Recent Labs  Lab 08/24/19 0450 08/24/19 0450 08/25/19 0558 08/26/19 0532 08/27/19 0410 08/28/19 0146 08/29/19 0200  NA 136   < > 139 137 137 136 137  K 3.7   < > 3.6 3.5 3.5 4.0 4.2  CL 102   < > 103 108 106 105 105  CO2 21*   < > 20* 19* 25 24 24   GLUCOSE 88   < > 67* 96 137* 118* 95  BUN 25*   < > 21 11 10 10 12   CREATININE 0.43*   < > 0.47 0.32* 0.30* 0.35* <0.30*  CALCIUM 8.7*   < > 8.4* 8.1* 8.1* 7.8* 8.1*  MG 2.2  --    --  1.9 1.9 2.0 2.0  PHOS  --   --   --  2.9 2.7 3.0 3.5   < > = values in this interval not displayed.   GFR: CrCl cannot be calculated (This lab value cannot be used to calculate CrCl because it is not a number: <0.30). Liver Function Tests: Recent Labs  Lab 08/24/19 0450 08/25/19 0558 08/27/19 0410 08/28/19 0146 08/29/19 0200  AST 26 27 24 26 24   ALT 16 16 16 17 15   ALKPHOS 114 94 95 87 87  BILITOT 0.7 1.0 0.8 0.4 0.3  PROT 6.4* 5.5* 5.1* 5.1* 5.2*  ALBUMIN 3.4* 2.8* 2.5* 2.5* 2.6*   Recent Labs  Lab 08/23/19 1404  LIPASE 18   No results for input(s): AMMONIA in the last 168 hours. Coagulation Profile: Recent Labs  Lab 08/24/19 0450 08/26/19 0532  INR 1.1 1.2   Cardiac Enzymes: No results for input(s): CKTOTAL, CKMB, CKMBINDEX, TROPONINI in the last 168 hours. BNP (last 3 results) No results for input(s): PROBNP in the last 8760 hours. HbA1C: No results for input(s): HGBA1C in the last 72 hours. CBG: Recent Labs  Lab 08/28/19 0007 08/28/19 1211 08/28/19 1559 08/29/19 0012 08/29/19 0522  GLUCAP 124* 127* 119* 127* 112*   Lipid Profile: Recent Labs    08/27/19 0410 08/29/19 0200  TRIG 49 43   Thyroid Function Tests: No results for input(s): TSH, T4TOTAL, FREET4, T3FREE, THYROIDAB in the last 72 hours. Anemia Panel: No results for input(s): VITAMINB12, FOLATE, FERRITIN, TIBC, IRON, RETICCTPCT in the last 72 hours. Urine analysis:    Component Value Date/Time   COLORURINE YELLOW 08/28/2019 [place] 08/28/2019 1223   LABSPEC 1.010 08/28/2019 1223   PHURINE 6.5 08/28/2019 1223   GLUCOSEU NEGATIVE 08/28/2019 1223   HGBUR NEGATIVE 08/28/2019 1223   [place] 08/28/2019 [place] 08/28/2019 1223   PROTEINUR NEGATIVE 08/28/2019 1223   NITRITE NEGATIVE 08/28/2019 1223   LEUKOCYTESUR NEGATIVE 08/28/2019 1223   Sepsis Labs: @LABRCNTIP (procalcitonin:4,lacticidven:4)  ) Recent Results (from the past 240  hour(s))  Respiratory Panel by RT PCR (Flu A&B, Covid) - Nasopharyngeal Swab     Status: None   Collection Time: 08/23/19  6:35 PM   Specimen: Nasopharyngeal Swab  Result Value Ref Range Status   SARS Coronavirus 2 by RT PCR NEGATIVE NEGATIVE Final    Comment: (NOTE) SARS-CoV-2 target nucleic acids are NOT DETECTED. The SARS-CoV-2 RNA is generally detectable in upper respiratoy specimens during the acute phase of infection. The lowest  concentration of SARS-CoV-2 viral copies this assay can detect is 131 copies/mL. A negative result does not preclude SARS-Cov-2 infection and should not be used as the sole basis for treatment or other patient management decisions. A negative result may occur with  improper specimen collection/handling, submission of specimen other than nasopharyngeal swab, presence of viral mutation(s) within the areas targeted by this assay, and inadequate number of viral copies (<131 copies/mL). A negative result must be combined with clinical observations, patient history, and epidemiological information. The expected result is Negative. Fact Sheet for Patients:  PinkCheek.be Fact Sheet for Healthcare Providers:  GravelBags.it This test is not yet ap proved or cleared by the [place] FDA and  has been authorized for detection and/or diagnosis of SARS-CoV-2 by FDA under an Emergency Use Authorization (EUA). This EUA will remain  in effect (meaning this test can be used) for the duration of the COVID-19 declaration under Section 564(b)(1) of the Act, 21 U.S.C. section 360bbb-3(b)(1), unless the authorization is terminated or revoked sooner.    Influenza A by PCR NEGATIVE NEGATIVE Final   Influenza B by PCR NEGATIVE NEGATIVE Final    Comment: (NOTE) The Xpert Xpress SARS-CoV-2/FLU/RSV assay is intended as an aid in  the diagnosis of influenza from Nasopharyngeal swab specimens and  should not be used as  a sole basis for treatment. Nasal washings and  aspirates are unacceptable for Xpert Xpress SARS-CoV-2/FLU/RSV  testing. Fact Sheet for Patients: PinkCheek.be Fact Sheet for Healthcare Providers: GravelBags.it This test is not yet approved or cleared by the [place] FDA and  has been authorized for detection and/or diagnosis of SARS-CoV-2 by  FDA under an Emergency Use Authorization (EUA). This EUA will remain  in effect (meaning this test can be used) for the duration of the  Covid-19 declaration under Section 564(b)(1) of the Act, 21  U.S.C. section 360bbb-3(b)(1), unless the authorization is  terminated or revoked. Performed at [facility], [place] [address] [place], [place] [address]          Radiology Studies: CT HEAD WO CONTRAST  Result Date: 08/28/2019 CLINICAL DATA:  Seizure. Abnormal neuro exam. Falls. EXAM: CT HEAD WITHOUT CONTRAST TECHNIQUE: Contiguous axial images were obtained from the base of the skull through the vertex without intravenous contrast. COMPARISON:  None. FINDINGS: Brain: Mild generalized atrophy and moderate diffuse white matter disease is present bilaterally. Basal ganglia are intact. Insular ribbon is normal bilaterally. No acute hemorrhage or mass lesion is present. The ventricles are proportionate to the degree of atrophy. No significant extraaxial fluid collection is present. The brainstem and cerebellum are within normal limits. Vascular: Atherosclerotic changes are present within the cavernous internal carotid arteries bilaterally as well as at the dural margin of the left vertebral artery. No hyperdense vessel is present. Skull: Calvarium is intact. No focal lytic or blastic lesions are present. Sinuses/Orbits: The paranasal sinuses and mastoid air cells are clear. Bilateral lens replacements are noted. Globes and orbits are otherwise unremarkable. IMPRESSION: 1. Mild  generalized atrophy and moderate diffuse white matter disease. This likely reflects the sequela of chronic microvascular ischemia. 2. No acute intracranial abnormality or significant interval change. Electronically Signed   By: San Morelle M.D.   On: 08/28/2019 15:17     Scheduled Meds: . Chlorhexidine Gluconate Cloth  6 each Topical Daily  . diclofenac Sodium  2 g Topical QID  . enoxaparin (LOVENOX) injection  40 mg Subcutaneous Q24H  . insulin aspart  0-9 Units Subcutaneous Q6H  .  nicotine  14 mg Transdermal Daily  . pantoprazole (PROTONIX) IV  40 mg Intravenous Daily  . sodium chloride flush  10-40 mL Intracatheter Q12H  . sodium chloride flush  3 mL Intravenous Q12H   Continuous Infusions: . TPN ADULT (ION) 75 mL/hr at 08/28/19 1721  . TPN ADULT (ION)       LOS: 6 days    Time spent: 17min  Domenic Polite, MD [facility]  08/29/2019, 11:44 AM

## 2019-08-29 NOTE — Progress Notes (Signed)
PHARMACY - TOTAL PARENTERAL NUTRITION CONSULT NOTE   Indication: sigmoid colon mass with large bowel obstruction  Patient Measurements: Height: 4' 2.5" (128.3 cm) Weight: 59.8 kg (131 lb 13.4 oz) IBW/kg (Calculated) : 23.65 TPN AdjBW (KG): 32.5 Body mass index is 36.35 kg/m.   Assessment: 86 yo F with sigmoid colon mass causing large bowel obstruction, small bowel with thickening and liver mass. Pharmacy consulted for TPN.    Glucose / Insulin: No hx of diabetes. 2 units SSI in past 24 hours. CBGs at goal < 150.  Electrolytes: WNL (goal K+ =/> 4, Mag =/> 2). Corrected Calcium 9.22.  Renal: SCr low, at baseline LFTs / TGs: LFTs WNL, Triglycerides 49 (4/17) >> 43 (4/19) Prealbumin / albumin: Low- 5.4 (4/16) >> 5.8 (4/19)/ 2.6 Intake / Output; MIVF:  I/O documented: 657/300 (also has 5 unmeasured urine occurrences). LR stopped 4/18.  GI Imaging: 4/13 CT abdomen: infiltrating sigmoid colon mass suspicious for colon CA resulting in obstruction.  Surgeries / Procedures:  4/15 s/p flex sig Planning for exploratory laparotomy, possible bowel resection, possible ostomy, possible G-tube this week (surgery date/time TBD) Central access: PICC line placed 08/26/19 TPN start date: 08/26/19  Nutritional Goals (per RD recommendation on 08/26/19): kCal: 1750-1950, Protein: 86-100 g/day, Fluid: >1.7L/day  Goal TPN rate is 75 mL/hr (provides 90 g of protein and 1818 kCal per day)  Current Nutrition:  -Clear liquids started 4/15 --> now NPO  -Boost Breeze TID ordered to start 4/16, stopped 4/17 due to emesis and epigastric pain with protein shakes -TPN started 4/16  Plan:  At 1800: Continue TPN at goal rate of 5mL/hr with 15% dextrose, 30g/L lipids, 50g/L protein. Electrolytes in TPN: 74mEq/L of Na, 25mEq/L of K, 74mEq/L of Ca, 74mEq/L of Mag, and 71mmol/L of Phos. Cl:Ac ratio 1:1.  Add standard MVI and trace elements to TPN.  Continue Sensitive q6h SSI and adjust as needed.  MIVF management per  MD (LR discontinued 4/18).    Monitor TPN labs on Mon/Thurs.  BMET, Mag, Phos in AM.    Lindell Spar, PharmD, BCPS Clinical Pharmacist  08/29/2019,7:54 AM

## 2019-08-29 NOTE — Progress Notes (Addendum)
[facility] Progress Note  4 Days Post-Op  Subjective: CC:  No complaints. Denies abdominal pain. Denies flatus or BM. Denies nausea or vomiting. Reports that she is voiding every hour. Requests that we call her grandson, byron, before surgery.    Objective: Vital signs in last 24 hours: Temp:  [97.5 F (36.4 C)-98.1 F (36.7 C)] 97.7 F (36.5 C) (04/19 0519) Pulse Rate:  [70-91] 78 (04/19 0519) Resp:  [16-22] 16 (04/19 0519) BP: (107-147)/(78-98) 147/98 (04/19 0519) SpO2:  [94 %-97 %] 97 % (04/19 0519) Weight:  [59.8 kg] 59.8 kg (04/19 0519) Last BM Date: 08/25/19(per dayshift RN)  Intake/Output from previous day: 04/18 0701 - 04/19 0700 In: 657.3 [I.V.:657.3] Out: 300 [Urine:300] Intake/Output this shift: No intake/output data recorded.  PE: Gen:  Alert, NAD, pleasant Card:  Regular rate and rhythm, pedal pulses 2+ BL Pulm:  Normal effort, clear to auscultation bilaterally Abd: distended, firm, non-tender, no peritonitis.  Skin: warm and dry, no rashes  Psych: A&Ox3   Lab Results:  Recent Labs    08/28/19 0934 08/29/19 0200  WBC 7.0 8.8  HGB 11.0* 10.9*  HCT 33.9* 33.8*  PLT 297 300   BMET Recent Labs    08/28/19 0146 08/29/19 0200  NA 136 137  K 4.0 4.2  CL 105 105  CO2 24 24  GLUCOSE 118* 95  BUN 10 12  CREATININE 0.35* <0.30*  CALCIUM 7.8* 8.1*   PT/INR No results for input(s): LABPROT, INR in the last 72 hours. CMP     Component Value Date/Time   NA 137 08/29/2019 0200   K 4.2 08/29/2019 0200   CL 105 08/29/2019 0200   CO2 24 08/29/2019 0200   GLUCOSE 95 08/29/2019 0200   BUN 12 08/29/2019 0200   CREATININE <0.30 (L) 08/29/2019 0200   CREATININE 0.44 (L) 04/14/2017 0859   CALCIUM 8.1 (L) 08/29/2019 0200   PROT 5.2 (L) 08/29/2019 0200   ALBUMIN 2.6 (L) 08/29/2019 0200   AST 24 08/29/2019 0200   ALT 15 08/29/2019 0200   ALKPHOS 87 08/29/2019 0200   BILITOT 0.3 08/29/2019 0200   GFRNONAA NOT CALCULATED 08/29/2019 0200    GFRAA NOT CALCULATED 08/29/2019 0200   Lipase     Component Value Date/Time   LIPASE 18 08/23/2019 1404       Studies/Results: CT HEAD WO CONTRAST  Result Date: 08/28/2019 CLINICAL DATA:  Seizure. Abnormal neuro exam. Falls. EXAM: CT HEAD WITHOUT CONTRAST TECHNIQUE: Contiguous axial images were obtained from the base of the skull through the vertex without intravenous contrast. COMPARISON:  None. FINDINGS: Brain: Mild generalized atrophy and moderate diffuse white matter disease is present bilaterally. Basal ganglia are intact. Insular ribbon is normal bilaterally. No acute hemorrhage or mass lesion is present. The ventricles are proportionate to the degree of atrophy. No significant extraaxial fluid collection is present. The brainstem and cerebellum are within normal limits. Vascular: Atherosclerotic changes are present within the cavernous internal carotid arteries bilaterally as well as at the dural margin of the left vertebral artery. No hyperdense vessel is present. Skull: Calvarium is intact. No focal lytic or blastic lesions are present. Sinuses/Orbits: The paranasal sinuses and mastoid air cells are clear. Bilateral lens replacements are noted. Globes and orbits are otherwise unremarkable. IMPRESSION: 1. Mild generalized atrophy and moderate diffuse white matter disease. This likely reflects the sequela of chronic microvascular ischemia. 2. No acute intracranial abnormality or significant interval change. Electronically Signed   By: Wynetta Fines.D.  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 Will discuss  timing of surgery with Dr. Marlou Starks. Patient will require exploratory laparotomy, possible bowel resection, possible ostomy, possible G-tube.   FEN: NPO, TNA  ID: none VTE: SCD's, Lovenox   LOS: 6 days    Obie Dredge, [facility] Surgery Pager: [phone]

## 2019-08-30 ENCOUNTER — Inpatient Hospital Stay (HOSPITAL_COMMUNITY): Payer: Medicare Other | Admitting: Certified Registered Nurse Anesthetist

## 2019-08-30 ENCOUNTER — Encounter (HOSPITAL_COMMUNITY)
Admission: EM | Disposition: A | Discharge status: Skilled Nursing Facility | Payer: Self-pay | Source: Home / Self Care | Attending: Internal Medicine

## 2019-08-30 ENCOUNTER — Encounter (HOSPITAL_COMMUNITY): Payer: Self-pay | Admitting: Internal Medicine

## 2019-08-30 HISTORY — PX: LAPAROTOMY: SHX154

## 2019-08-30 LAB — SAMPLE TO BLOOD BANK

## 2019-08-30 LAB — TYPE AND SCREEN
ABO/RH(D): A POS
Antibody Screen: NEGATIVE

## 2019-08-30 LAB — MAGNESIUM: Magnesium: 2 mg/dL (ref 1.7–2.4)

## 2019-08-30 LAB — PHOSPHORUS: Phosphorus: 3.9 mg/dL (ref 2.5–4.6)

## 2019-08-30 LAB — BASIC METABOLIC PANEL
Anion gap: 6 (ref 5–15)
BUN: 16 mg/dL (ref 8–23)
CO2: 24 mmol/L (ref 22–32)
Calcium: 8.2 mg/dL — ABNORMAL LOW (ref 8.9–10.3)
Chloride: 104 mmol/L (ref 98–111)
Creatinine, Ser: <0.3 mg/dL — ABNORMAL LOW (ref 0.44–1.00)
Glucose, Bld: 122 mg/dL — ABNORMAL HIGH (ref 70–99)
Potassium: 4.4 mmol/L (ref 3.5–5.1)
Sodium: 134 mmol/L — ABNORMAL LOW (ref 135–145)

## 2019-08-30 LAB — GLUCOSE, CAPILLARY
Glucose-Capillary: 135 mg/dL — ABNORMAL HIGH (ref 70–99)
Glucose-Capillary: 93 mg/dL (ref 70–99)
Glucose-Capillary: 133 mg/dL — ABNORMAL HIGH (ref 70–99)

## 2019-08-30 SURGERY — LAPAROTOMY, EXPLORATORY
Anesthesia: General

## 2019-08-30 MED ORDER — LIDOCAINE 2% (20 MG/ML) 5 ML SYRINGE
INTRAMUSCULAR | Status: AC
  Filled 2019-08-30: qty 5

## 2019-08-30 MED ORDER — LACTATED RINGERS IV SOLN: INTRAVENOUS | Status: DC | PRN

## 2019-08-30 MED ORDER — SUGAMMADEX SODIUM 200 MG/2ML IV SOLN
INTRAVENOUS | Status: DC | PRN
  Administered 2019-08-30: 120 mg via INTRAVENOUS

## 2019-08-30 MED ORDER — PHENYLEPHRINE HCL (PRESSORS) 10 MG/ML IV SOLN
INTRAVENOUS | Status: AC
  Filled 2019-08-30: qty 1

## 2019-08-30 MED ORDER — SUGAMMADEX SODIUM 500 MG/5ML IV SOLN
INTRAVENOUS | Status: AC
  Filled 2019-08-30: qty 5

## 2019-08-30 MED ORDER — LACTATED RINGERS IV SOLN: INTRAVENOUS | Status: DC

## 2019-08-30 MED ORDER — ROCURONIUM BROMIDE 50 MG/5ML IV SOSY
PREFILLED_SYRINGE | INTRAVENOUS | Status: DC | PRN
  Administered 2019-08-30: 60 mg via INTRAVENOUS

## 2019-08-30 MED ORDER — PROPOFOL 10 MG/ML IV BOLUS
INTRAVENOUS | Status: AC
  Filled 2019-08-30: qty 20

## 2019-08-30 MED ORDER — PHENYLEPHRINE 40 MCG/ML (10ML) SYRINGE FOR IV PUSH (FOR BLOOD PRESSURE SUPPORT)
PREFILLED_SYRINGE | INTRAVENOUS | Status: AC
  Filled 2019-08-30: qty 20

## 2019-08-30 MED ORDER — PROPOFOL 10 MG/ML IV BOLUS
INTRAVENOUS | Status: DC | PRN
  Administered 2019-08-30: 50 mg via INTRAVENOUS

## 2019-08-30 MED ORDER — TRAVASOL 10 % IV SOLN
INTRAVENOUS | Status: AC
  Filled 2019-08-30: qty 900

## 2019-08-30 MED ORDER — 0.9 % SODIUM CHLORIDE (POUR BTL) OPTIME
TOPICAL | Status: DC | PRN
  Administered 2019-08-30: 2000 mL

## 2019-08-30 MED ORDER — SUCCINYLCHOLINE CHLORIDE 200 MG/10ML IV SOSY
PREFILLED_SYRINGE | INTRAVENOUS | Status: AC
  Filled 2019-08-30: qty 10

## 2019-08-30 MED ORDER — PHENYLEPHRINE 40 MCG/ML (10ML) SYRINGE FOR IV PUSH (FOR BLOOD PRESSURE SUPPORT)
PREFILLED_SYRINGE | INTRAVENOUS | Status: DC | PRN
  Administered 2019-08-30: 200 ug via INTRAVENOUS

## 2019-08-30 MED ORDER — DEXAMETHASONE SODIUM PHOSPHATE 10 MG/ML IJ SOLN
INTRAMUSCULAR | Status: DC | PRN
  Administered 2019-08-30: 5 mg via INTRAVENOUS

## 2019-08-30 MED ORDER — ROCURONIUM BROMIDE 10 MG/ML (PF) SYRINGE
PREFILLED_SYRINGE | INTRAVENOUS | Status: AC
  Filled 2019-08-30: qty 10

## 2019-08-30 MED ORDER — EPHEDRINE 5 MG/ML INJ
INTRAVENOUS | Status: AC
  Filled 2019-08-30: qty 20

## 2019-08-30 MED ORDER — ONDANSETRON HCL 4 MG/2ML IJ SOLN
INTRAMUSCULAR | Status: AC
  Filled 2019-08-30: qty 2

## 2019-08-30 MED ORDER — DEXAMETHASONE SODIUM PHOSPHATE 10 MG/ML IJ SOLN
INTRAMUSCULAR | Status: AC
  Filled 2019-08-30: qty 1

## 2019-08-30 MED ORDER — LIDOCAINE 2% (20 MG/ML) 5 ML SYRINGE
INTRAMUSCULAR | Status: DC | PRN
  Administered 2019-08-30: 40 mg via INTRAVENOUS

## 2019-08-30 MED ORDER — ONDANSETRON HCL 4 MG/2ML IJ SOLN
INTRAMUSCULAR | Status: DC | PRN
  Administered 2019-08-30: 4 mg via INTRAVENOUS

## 2019-08-30 MED ORDER — ENOXAPARIN SODIUM 40 MG/0.4ML ~~LOC~~ SOLN
40.0000 mg | SUBCUTANEOUS | Status: DC
  Administered 2019-08-31 – 2019-09-04 (×5): 40 mg via SUBCUTANEOUS
  Filled 2019-08-30 (×5): qty 0.4

## 2019-08-30 MED ORDER — FENTANYL CITRATE (PF) 250 MCG/5ML IJ SOLN
INTRAMUSCULAR | Status: AC
  Filled 2019-08-30: qty 5

## 2019-08-30 MED ORDER — PHENYLEPHRINE HCL-NACL 10-0.9 MG/250ML-% IV SOLN
INTRAVENOUS | Status: DC | PRN
  Administered 2019-08-30: 50 ug/min via INTRAVENOUS

## 2019-08-30 MED ORDER — FENTANYL CITRATE (PF) 100 MCG/2ML IJ SOLN
INTRAMUSCULAR | Status: DC | PRN
  Administered 2019-08-30 (×3): 50 ug via INTRAVENOUS

## 2019-08-30 MED ORDER — BOOST / RESOURCE BREEZE PO LIQD CUSTOM
1.0000 | Freq: Three times a day (TID) | ORAL | Status: DC
  Administered 2019-08-31 – 2019-09-05 (×14): 1 via ORAL

## 2019-08-30 MED ORDER — SODIUM CHLORIDE 0.9 % IV SOLN
INTRAVENOUS | Status: AC
  Filled 2019-08-30: qty 2

## 2019-08-30 SURGICAL SUPPLY — 53 items
ADH SKN CLS APL DERMABOND .7 (GAUZE/BANDAGES/DRESSINGS)
APPLIER CLIP 5 13 M/L LIGAMAX5 (MISCELLANEOUS)
APR CLP MED LRG 5 ANG JAW (MISCELLANEOUS)
BARRIER SKIN 2 3/4 (OSTOMY) ×2 IMPLANT
BARRIER SKIN 2 3/4 INCH (OSTOMY) ×1
BARRIER SKIN OD2.25 2 3/4 FLNG (OSTOMY) IMPLANT
BLADE EXTENDED COATED 6.5IN (ELECTRODE) IMPLANT
BRR SKN FLT 2.75X2.25 2 PC (OSTOMY) ×1
CLIP APPLIE 5 13 M/L LIGAMAX5 (MISCELLANEOUS) IMPLANT
COUNTER NEEDLE 20 DBL MAG RED (NEEDLE) ×3 IMPLANT
COVER MAYO STAND STRL (DRAPES) ×7 IMPLANT
COVER WAND RF STERILE (DRAPES) ×2 IMPLANT
DECANTER SPIKE VIAL GLASS SM (MISCELLANEOUS) ×1 IMPLANT
DERMABOND ADVANCED (GAUZE/BANDAGES/DRESSINGS)
DERMABOND ADVANCED .7 DNX12 (GAUZE/BANDAGES/DRESSINGS) IMPLANT
DRAPE LAPAROSCOPIC ABDOMINAL (DRAPES) ×3 IMPLANT
DRSG OPSITE POSTOP 4X10 (GAUZE/BANDAGES/DRESSINGS) ×2 IMPLANT
DRSG OPSITE POSTOP 4X6 (GAUZE/BANDAGES/DRESSINGS) IMPLANT
DRSG OPSITE POSTOP 4X8 (GAUZE/BANDAGES/DRESSINGS) ×4 IMPLANT
ELECT BLADE TIP CTD 4 INCH (ELECTRODE) ×2 IMPLANT
ELECT PENCIL ROCKER SW 15FT (MISCELLANEOUS) ×2 IMPLANT
ELECT REM PT RETURN 15FT ADLT (MISCELLANEOUS) ×3 IMPLANT
GAUZE SPONGE 2X2 8PLY STRL LF (GAUZE/BANDAGES/DRESSINGS) IMPLANT
GAUZE SPONGE 4X4 12PLY STRL (GAUZE/BANDAGES/DRESSINGS) IMPLANT
GLOVE BIO SURGEON STRL SZ7.5 (GLOVE) ×6 IMPLANT
GOWN STRL REUS W/TWL XL LVL3 (GOWN DISPOSABLE) ×12 IMPLANT
KIT TURNOVER KIT A (KITS) IMPLANT
LEGGING LITHOTOMY PAIR STRL (DRAPES) ×3 IMPLANT
LIGASURE IMPACT 36 18CM CVD LR (INSTRUMENTS) ×2 IMPLANT
PACK COLON (CUSTOM PROCEDURE TRAY) ×3 IMPLANT
PENCIL SMOKE EVACUATOR (MISCELLANEOUS) IMPLANT
POUCH OSTOMY 2 3/4  H 3804 (WOUND CARE) ×3
POUCH OSTOMY 2 3/4 H 3804 (WOUND CARE) ×1
POUCH OSTOMY 2 PC DRNBL 2.75 (WOUND CARE) IMPLANT
SPONGE GAUZE 2X2 STER 10/PKG (GAUZE/BANDAGES/DRESSINGS)
STAPLER CUT CVD 40MM GREEN (STAPLE) ×2 IMPLANT
STAPLER PROXIMATE 75MM BLUE (STAPLE) ×2 IMPLANT
STAPLER VISISTAT 35W (STAPLE) IMPLANT
SURGILUBE 2OZ TUBE FLIPTOP (MISCELLANEOUS) IMPLANT
SUT PDS AB 1 CTX 36 (SUTURE) IMPLANT
SUT PDS AB 1 TP1 96 (SUTURE) ×4 IMPLANT
SUT PROLENE 2 0 SH DA (SUTURE) ×3 IMPLANT
SUT SILK 2 0 (SUTURE) ×3
SUT SILK 2 0 SH CR/8 (SUTURE) ×3 IMPLANT
SUT SILK 2-0 18XBRD TIE 12 (SUTURE) ×1 IMPLANT
SUT SILK 3 0 (SUTURE) ×3
SUT SILK 3 0 SH CR/8 (SUTURE) ×3 IMPLANT
SUT SILK 3-0 18XBRD TIE 12 (SUTURE) ×1 IMPLANT
SUT VIC AB 3-0 SH 8-18 (SUTURE) ×2 IMPLANT
TOWEL OR NON WOVEN STRL DISP B (DISPOSABLE) ×3 IMPLANT
TRAY FOLEY MTR SLVR 16FR STAT (SET/KITS/TRAYS/PACK) ×2 IMPLANT
TUBING CONNECTING 10 (TUBING) IMPLANT
TUBING CONNECTING 10' (TUBING)

## 2019-08-30 NOTE — Progress Notes (Signed)
Pt arrived from the OR to PACU with colostomy full of air and stool. Stool leaking under honeycomb dressing. Colostomy pouch and honeycomb dressing removed and entire area cleansed with water and betadine. Copious amounts of liquid stool draining from stoma, estimated about 1565ml. Dressing and colostomy pouch replaced, pt bathed. Pt tolerated well. Will continue to monitor. Assisted with all care by Faith, OR nurse. Coolidge Breeze, RN 08/30/2019

## 2019-08-30 NOTE — Progress Notes (Signed)
Pt refused vital signs at 1754. Pt in yellow mews. Reassessment completed. Will continue to monitor.

## 2019-08-30 NOTE — Transfer of Care (Signed)
Immediate Anesthesia Transfer of Care Note  Patient: [name]  Procedure(s) Performed: EXPLORATORY LAPAROTOMY POSSIBLE BOWEL RESECTION (N/A )  Patient Location: PACU  Anesthesia Type:General  Level of Consciousness: sedated and patient cooperative  Airway & Oxygen Therapy: Patient Spontanous Breathing and Patient connected to face mask oxygen  Post-op Assessment: Report given to RN and Post -op Vital signs reviewed and stable  Post vital signs: stable  Last Vitals:  Vitals Value Taken Time  BP 147/88 08/30/19 1500  Temp    Pulse 75 08/30/19 1500  Resp 27 08/30/19 1500  SpO2 93 % 08/30/19 1500  Vitals shown include unvalidated device data.  Last Pain:  Vitals:   08/30/19 1157  TempSrc: Oral  PainSc:          Complications: No apparent anesthesia complications

## 2019-08-30 NOTE — Interval H&P Note (Signed)
History and Physical Interval Note:  08/30/2019 12:18 PM  [name]  has presented today for surgery, with the diagnosis of obstructing sigmoid cancer.  The various methods of treatment have been discussed with the patient and family. After consideration of risks, benefits and other options for treatment, the patient has consented to  Procedure(s): EXPLORATORY LAPAROTOMY POSSIBLE BOWEL RESECTION (N/A) as a surgical intervention.  The patient's history has been reviewed, patient examined, no change in status, stable for surgery.  I have reviewed the patient's chart and labs.  Questions were answered to the patient's satisfaction.     Autumn Messing III

## 2019-08-30 NOTE — Anesthesia Preprocedure Evaluation (Addendum)
Anesthesia Evaluation  Patient identified by MRN, date of birth, ID band Patient awake    Reviewed: Allergy & Precautions, NPO status , Patient's Chart, lab work & pertinent test results  Airway Mallampati: II  TM Distance: >3 FB Neck ROM: Full    Dental  (+) Edentulous Upper, Edentulous Lower   Pulmonary neg pulmonary ROS,    Pulmonary exam normal breath sounds clear to auscultation       Cardiovascular hypertension, Pt. on medications + Peripheral Vascular Disease (on plavix, last dose 08/22/19)  Normal cardiovascular exam Rhythm:Regular Rate:Normal  TTE 08/2019 EF 60-65%, G1DD, mild AI   Neuro/Psych negative neurological ROS  negative psych ROS   GI/Hepatic Neg liver ROS, GERD  Medicated,  Endo/Other  negative endocrine ROS  Renal/GU negative Renal ROS  negative genitourinary   Musculoskeletal  (+) Arthritis , S/p b/l BKA   Abdominal   Peds  Hematology  (+) Blood dyscrasia (Hgb 10.9), ,   Anesthesia Other Findings Admitted 4/18 with large bowel obstruction. CT showed sigmoid colon mass with distal colonic obstruction with metastases to the liver. On TPN.   Reproductive/Obstetrics                            Anesthesia Physical Anesthesia Plan  ASA: III  Anesthesia Plan: General   Post-op Pain Management:    Induction: Intravenous  PONV Risk Score and Plan: 3 and Dexamethasone, Ondansetron and Treatment may vary due to age or medical condition  Airway Management Planned: Oral ETT  Additional Equipment:   Intra-op Plan:   Post-operative Plan: Extubation in OR and Possible Post-op intubation/ventilation  Informed Consent: I have reviewed the patients History and Physical, chart, labs and discussed the procedure including the risks, benefits and alternatives for the proposed anesthesia with the patient or authorized representative who has indicated his/her understanding and  acceptance.   Patient has DNR.  Suspend DNR and Discussed DNR with power of attorney.   Dental advisory given  Plan Discussed with: CRNA  Anesthesia Plan Comments: (Possible arterial line. Plan to continue TPN intraop. Follow blood sugar. Spoke with grandson regarding DNR: plan to suseand peri-operatively.)      Anesthesia Quick Evaluation

## 2019-08-30 NOTE — Progress Notes (Signed)
PHARMACY - TOTAL PARENTERAL NUTRITION CONSULT NOTE   Indication: sigmoid colon mass with large bowel obstruction  Patient Measurements: Height: 4' 2.5" (128.3 cm) Weight: 58.9 kg (129 lb 13.6 oz) IBW/kg (Calculated) : 23.65 TPN AdjBW (KG): 32.5 Body mass index is 35.8 kg/m.   Assessment: 86 yo F with sigmoid colon mass causing large bowel obstruction, small bowel with thickening and liver mass. Pharmacy consulted for TPN.    Glucose / Insulin: No hx of diabetes. 2 units SSI in past 24 hours. CBGs at goal < 150.  Electrolytes: WNL x Na 134 (goal K+ =/> 4, Mag =/> 2).  Renal: SCr low, at baseline LFTs / TGs: LFTs WNL, Triglycerides 49 (4/17) >> 43 (4/19) Prealbumin / albumin: Low- 5.4 (4/16) >> 5.8 (4/19)/ 2.6 Intake / Output; MIVF:  I/O + 254  (also has 6 unmeasured urine occurrences). LR stopped 4/18.  GI Imaging: 4/13 CT abdomen: infiltrating sigmoid colon mass suspicious for colon CA resulting in obstruction.  Surgeries / Procedures:  4/15 s/p flex sig 4/20>> exploratory laparotomy, possible bowel resection, possible ostomy, possible G-tube this week  Central access: PICC line placed 08/26/19 TPN start date: 08/26/19  Nutritional Goals (per RD recommendation on 08/26/19): kCal: 1750-1950, Protein: 86-100 g/day, Fluid: >1.7L/day  Goal TPN rate is 75 mL/hr (provides 90 g of protein and 1818 kCal per day)  Current Nutrition:  -Clear liquids started 4/15 --> now NPO  -Boost Breeze TID ordered to start 4/16, stopped 4/17 due to emesis and epigastric pain with protein shakes -TPN started 4/16  Plan:  At 1800: Continue TPN at goal rate of 70mL/hr with 15% dextrose, 30g/L lipids, 50g/L protein. Electrolytes in TPN: 33mEq/L of Na, 27mEq/L of K, 26mEq/L of Ca, 64mEq/L of Mag, and 70mmol/L of Phos. Cl:Ac ratio 1:1.  standard MVI and trace elements in TPN.  Continue Sensitive q6h SSI and adjust as needed.  MIVF management per MD (LR discontinued 4/18).    Monitor TPN labs on  Mon/Thurs. BMET, Mag, Phos in AM.   Eudelia Bunch, Pharm.D [phone] 08/30/2019 7:53 AM

## 2019-08-30 NOTE — Op Note (Signed)
08/23/2019 - 08/30/2019  2:39 PM  PATIENT:  [name]  86 y.o. female  PRE-OPERATIVE DIAGNOSIS:  obstructing sigmoid cancer  POST-OPERATIVE DIAGNOSIS:  obstructing sigmoid cancer  PROCEDURE:  Procedure(s): EXPLORATORY LAPAROTOMY, SIGMOID COLECTOMY WITH DESCENDING COLOSTOMY(HARTMAN)  SURGEON:  Surgeon(s) and Role:    * Jovita Kussmaul, MD - Primary  PHYSICIAN ASSISTANT:   ASSISTANTS: Saverio Danker, PA   ANESTHESIA:   general  EBL:  minimal   BLOOD ADMINISTERED:none  DRAINS: none   LOCAL MEDICATIONS USED:  NONE  SPECIMEN:  Source of Specimen:  sigmoid colon with obstructing cancer  DISPOSITION OF SPECIMEN:  PATHOLOGY  COUNTS:  YES  TOURNIQUET:  * No tourniquets in log *  DICTATION: .Dragon Dictation   After informed consent was obtained the patient was brought to the operating room and placed in the supine position on the operating table.  After adequate induction of general anesthesia the patient's abdomen was prepped with ChloraPrep, allowed to dry, and draped in usual sterile manner.  An appropriate timeout was performed.  A midline incision was then made with a 10 blade knife.  The incision was carried through the skin and subcutaneous tissue sharply with electrocautery until the fascia of the anterior abdominal wall was encountered.  The fascia was opened sharply with the electrocautery.  The peritoneum was also opened with the electrocautery and the rest the incision was opened under direct vision.  The small bowel and colon were very dilated.  We were able to run the small bowel from the ligament of Treitz to the ileocecal valve.  There appeared to be a band of omentum that was partially obstructing the distal small bowel.  This was lysed with the LigaSure.  The tattoo from the sigmoid colon just distal to the cancer was identified.  The cancer was palpable and obstructing the sigmoid colon.  It appeared to be mobile although it was attached to the left tube and  ovary.  We were able to dissect the cancer away from the tube and ovary by blunt right angle dissection and sharp dissection with electrocautery.  The colon was opened during this portion of the dissection.  Once this was done then the sigmoid colon was more free and could be brought up into the wound.  A site was chosen proximal and distal to the area of the cancer where the bowel appeared to be more healthy.  The mesentery to each of these points was opened sharply with the electrocautery.  The distal rectosigmoid was divided with a firing of a contour green load stapler.  The proximal colon was divided with a single firing of the GIA-75 stapler.  The mesentery to the sigmoid colon was then taken down sharply with the LigaSure.  The stitch was used to mark the proximal staple line.  The sigmoid colon was then sent to pathology for further evaluation.  The wound was then irrigated with copious amounts of saline.  The left colon was mobilized by incising its retroperitoneal attachment along the white line of Toldt.  The staple line on the left colon was then mobile enough to reach to the abdominal wall.  We chose a site for the ostomy in the left lower quadrant.  A circle of skin and a core of subcutaneous fatty tissue was removed sharply with the electrocautery.  A cruciate incision was made in the fascia of the abdominal wall.  A Babcock was placed through this opening and used to bring the staple line of the  descending colon through the abdominal wall without difficulty.  The abdomen was generally inspected.  There was a hard white nodule adherent to the right pelvic sidewall.  There was no studding on the peritoneum or along the small bowel that we could identify.  There were tumors palpable in the right lobe of the liver.  The rest of the small intestine look normal.  The stomach also looked normal.  At this point the abdominal wall was closed with 2 running #1 double-stranded looped PDS sutures.  The  subcutaneous tissue was irrigated with copious amounts of saline and the skin was closed with staples.  A sterile towel was placed over the staple line.  The staple line of the descending colon at the ostomy was then removed sharply with the electrocautery.  The ostomy was matured with interrupted 3-0 Vicryl stitches.  An ostomy appliance and sterile dressings were then applied.  The patient tolerated the procedure well.  At the end of the case all needle sponge and instrument counts were correct.  The patient was then awakened and taken recovery in stable condition.  The assistant was crucial for completion of the case.  PLAN OF CARE: Admit to inpatient   PATIENT DISPOSITION:  PACU - hemodynamically stable.   Delay start of Pharmacological VTE agent (>24hrs) due to surgical blood loss or risk of bleeding: no

## 2019-08-30 NOTE — Anesthesia Procedure Notes (Signed)
Procedure Name: Intubation Date/Time: 08/30/2019 1:14 PM Performed by: West Pugh, CRNA Pre-anesthesia Checklist: Patient identified, Emergency Drugs available, Suction available, Patient being monitored and Timeout performed Patient Re-evaluated:Patient Re-evaluated prior to induction Oxygen Delivery Method: Circle system utilized Preoxygenation: Pre-oxygenation with 100% oxygen Induction Type: IV induction Ventilation: Mask ventilation without difficulty Laryngoscope Size: Mac and 3 Grade View: Grade II Tube type: Oral Tube size: 7.0 mm Number of attempts: 1 Airway Equipment and Method: Stylet Placement Confirmation: ETT inserted through vocal cords under direct vision,  positive ETCO2,  CO2 detector and breath sounds checked- equal and bilateral Secured at: 21 cm Tube secured with: Tape Dental Injury: Teeth and Oropharynx as per pre-operative assessment

## 2019-08-30 NOTE — Progress Notes (Signed)
PROGRESS NOTE    [name]  [id number] DOB: [birth year] DOA: 08/23/2019 PCP: [name], MD  Brief Narrative:Ms. [name]  is an 86 year old female with history of PAD s/p B/L BKA .  She was admitted 4/13 with large bowel obstruction. CT showed sigmoid colon mass with distal colonic obstruction with metastases to the liver. Surgery following.  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 IV fluids and supportive care -Flex sig 4/15 with malignant partially obstructing tumor in the sigmoid colon, biopsied, pathology with adenocarcinoma -General surgery following, -Started TPN 4/17 -Plan for surgery today  Delirium, agitation from 4/17-18 -developed hospital delirium, in the setting of IV narcotics, worsened after overnight dose of Haldol for agitation -Now improved, morphine discontinued, CT head unremarkable, urinalysis is benign and ABG without hypercarbia  Dehydration -Resolved with IV fluids and TPN  History of PAD -Bilateral BKA -Hold Plavix  Essential hypertension -Lisinopril on hold  GERD -Continue PPI  DVT prophylaxis: SCDs  code Status: DNR Family Communication: No family at bedside, d/w grandson [name] 4/18, updated Sister at bedside 4/19 Disposition Plan:  Status is: Inpatient Dispo: The patient is from: Home              Anticipated d/c is to: Home              Anticipated d/c date is: > 3 days              Patient currently not medically stable for discharge, awaiting surgery for malignant colonic  obstruction  Consultants:   General surgery  [facility]   Procedures: Flex sig 4/15 with malignant  partially obstructing tumor in the sigmoid colon  Antimicrobials:    Subjective: -Slight confusion this morning, no events overnight -Reports abdominal discomfort and distention, denies any vomiting  Objective: Vitals:   08/29/19 1426 08/29/19 2011 08/30/19 0500 08/30/19 0502  BP: 137/82 (!) 144/75  (!) 154/87  Pulse: 88 81  82  Resp: (!) 21 18  18   Temp: 98.4 F (36.9 C) 98 F (36.7 C)  98 F (36.7 C)  TempSrc: Oral Oral  Oral  SpO2: 97% 96%  100%  Weight:   58.9 kg   Height:        Intake/Output Summary (Last 24 hours) at 08/30/2019 1136 Last data filed at 08/30/2019 1017 Gross per 24 hour  Intake 1804.02 ml  Output 2550 ml  Net -745.98 ml   Filed Weights   08/28/19 0546 08/29/19 0519 08/30/19 0500  Weight: 60.1 kg 59.8 kg 58.9 kg    Examination:  Gen: Frail elderly female, laying in bed, awake alert, oriented to self, mild confusion HEENT: No JVD Lungs: Poor air movement bilaterally CVS: S1-S2, regular rate rhythm  abd: Soft, distended, bowel sounds present but diminished, nontender Extremities: Bilateral BKA  Skin: no new rashes on exposed skin   Data Reviewed:   CBC: Recent Labs  Lab 08/23/19 1404 08/24/19 0450 08/25/19 0558 08/26/19 0532 08/27/19 0410 08/28/19 0934 08/29/19 0200  WBC 8.2   < > 5.6 5.9 7.2 7.0 8.8  NEUTROABS 5.8  --   --   --  5.2  --  6.2  HGB 11.9*   < > 10.9* 10.4* 10.9* 11.0* 10.9*  HCT 36.5   < > 33.6* 32.9* 34.0* 33.9* 33.8*  MCV 91.7   < > 91.3 94.0 92.9 92.6 93.4  PLT 368   < > 300 286 300 297 300   < > = values in this interval not displayed.   Basic Metabolic Panel: Recent Labs  Lab 08/26/19 0532 08/27/19 0410 08/28/19 0146 08/29/19 0200 08/30/19 0445  NA 137 137 136 137 134*  K 3.5 3.5 4.0 4.2 4.4  CL 108 106 105 105 104  CO2 19* 25 24 24 24   GLUCOSE 96 137* 118* 95 122*  BUN 11 10 10 12 16   CREATININE 0.32* 0.30* 0.35* <0.30* <0.30*  CALCIUM 8.1* 8.1* 7.8* 8.1* 8.2*  MG 1.9 1.9 2.0 2.0 2.0  PHOS 2.9  2.7 3.0 3.5 3.9   GFR: CrCl cannot be calculated (This lab value cannot be used to calculate CrCl because it is not a number: <0.30). Liver Function Tests: Recent Labs  Lab 08/24/19 0450 08/25/19 0558 08/27/19 0410 08/28/19 0146 08/29/19 0200  AST 26 27 24 26 24   ALT 16 16 16 17 15   ALKPHOS 114 94 95 87 87  BILITOT 0.7 1.0 0.8 0.4 0.3  PROT 6.4* 5.5* 5.1* 5.1* 5.2*  ALBUMIN 3.4* 2.8* 2.5* 2.5* 2.6*   Recent Labs  Lab 08/23/19 1404  LIPASE 18   No results for input(s): AMMONIA in the last 168 hours. Coagulation Profile: Recent Labs  Lab 08/24/19 0450 08/26/19 0532  INR 1.1 1.2   Cardiac Enzymes: No results for input(s): CKTOTAL, CKMB, CKMBINDEX, TROPONINI in the last 168 hours. BNP (last 3 results) No results for input(s): PROBNP in the last 8760 hours. HbA1C: No results for input(s): HGBA1C in the last 72 hours. CBG: Recent Labs  Lab 08/29/19 0522 08/29/19 1152 08/29/19 1802 08/29/19 2317 08/30/19 0557  GLUCAP 112* 111* 104* 125* 135*   Lipid Profile: Recent Labs    08/29/19 0200  TRIG 43   Thyroid Function Tests: No results for input(s): TSH, T4TOTAL, FREET4, T3FREE, THYROIDAB in the last 72 hours. Anemia Panel: No results for input(s): VITAMINB12, FOLATE, FERRITIN, TIBC, IRON, RETICCTPCT in the last 72 hours. Urine analysis:    Component Value Date/Time   COLORURINE YELLOW 08/28/2019 [place] 08/28/2019 1223   LABSPEC 1.010 08/28/2019 1223   PHURINE 6.5 08/28/2019 1223   GLUCOSEU NEGATIVE 08/28/2019 1223   HGBUR NEGATIVE 08/28/2019 1223   [place] 08/28/2019 [place] 08/28/2019 1223   PROTEINUR NEGATIVE 08/28/2019 1223   NITRITE NEGATIVE 08/28/2019 1223   LEUKOCYTESUR NEGATIVE 08/28/2019 1223   Sepsis Labs: @LABRCNTIP (procalcitonin:4,lacticidven:4)  ) Recent Results (from the past 240 hour(s))  Respiratory Panel by RT PCR (Flu A&B, Covid) - Nasopharyngeal Swab     Status: None   Collection  Time: 08/23/19  6:35 PM   Specimen: Nasopharyngeal Swab  Result Value Ref Range Status   SARS Coronavirus 2 by RT PCR NEGATIVE NEGATIVE Final    Comment: (NOTE) SARS-CoV-2 target nucleic acids are NOT DETECTED. The SARS-CoV-2 RNA is generally detectable in upper respiratoy specimens during the acute phase of infection. The lowest concentration of SARS-CoV-2 viral copies this assay can detect is 131 copies/mL. A negative result does not preclude SARS-Cov-2 infection and should not be used as the sole basis for treatment or other patient management decisions. A negative result may occur with  improper specimen collection/handling, submission of specimen other than nasopharyngeal swab, presence of viral mutation(s) within the areas targeted by this assay,  and inadequate number of viral copies (<131 copies/mL). A negative result must be combined with clinical observations, patient history, and epidemiological information. The expected result is Negative. Fact Sheet for Patients:  PinkCheek.be Fact Sheet for Healthcare Providers:  GravelBags.it This test is not yet ap proved or cleared by the [place] FDA and  has been authorized for detection and/or diagnosis of SARS-CoV-2 by FDA under an Emergency Use Authorization (EUA). This EUA will remain  in effect (meaning this test can be used) for the duration of the COVID-19 declaration under Section 564(b)(1) of the Act, 21 U.S.C. section 360bbb-3(b)(1), unless the authorization is terminated or revoked sooner.    Influenza A by PCR NEGATIVE NEGATIVE Final   Influenza B by PCR NEGATIVE NEGATIVE Final    Comment: (NOTE) The Xpert Xpress SARS-CoV-2/FLU/RSV assay is intended as an aid in  the diagnosis of influenza from Nasopharyngeal swab specimens and  should not be used as a sole basis for treatment. Nasal washings and  aspirates are unacceptable for Xpert Xpress  SARS-CoV-2/FLU/RSV  testing. Fact Sheet for Patients: PinkCheek.be Fact Sheet for Healthcare Providers: GravelBags.it This test is not yet approved or cleared by the [place] FDA and  has been authorized for detection and/or diagnosis of SARS-CoV-2 by  FDA under an Emergency Use Authorization (EUA). This EUA will remain  in effect (meaning this test can be used) for the duration of the  Covid-19 declaration under Section 564(b)(1) of the Act, 21  U.S.C. section 360bbb-3(b)(1), unless the authorization is  terminated or revoked. Performed at [facility], [place] [address] [place], [place] [address]   Culture, Urine     Status: None   Collection Time: 08/28/19 12:25 PM   Specimen: Urine, Random  Result Value Ref Range Status   Specimen Description   Final    URINE, RANDOM Performed at [place] [address] [place], [place] [address]    Special Requests   Final    NONE Performed at [facility], [place] [address], [place], [place] [address]    Culture   Final    NO GROWTH Performed at [facility], [place] [address], [place], [place] [address]    Report Status 08/29/2019 FINAL  Final         Radiology Studies: CT HEAD WO CONTRAST  Result Date: 08/28/2019 CLINICAL DATA:  Seizure. Abnormal neuro exam. [place]. EXAM: CT HEAD WITHOUT CONTRAST TECHNIQUE: Contiguous axial images were obtained from the base of the skull through the vertex without intravenous contrast. COMPARISON:  None. FINDINGS: Brain: Mild generalized atrophy and moderate diffuse white matter disease is present bilaterally. Basal ganglia are intact. Insular ribbon is normal bilaterally. No acute hemorrhage or mass lesion is present. The ventricles are proportionate to the degree of atrophy. No significant extraaxial fluid collection is present. The brainstem and cerebellum are within normal  limits. Vascular: Atherosclerotic changes are present within the cavernous internal carotid arteries bilaterally as well as at the dural margin of the left vertebral artery. No hyperdense vessel is present. Skull: Calvarium is intact. No focal lytic or blastic lesions are present. Sinuses/Orbits: The paranasal sinuses and mastoid air cells are clear. Bilateral lens replacements are noted. Globes and orbits are otherwise unremarkable. IMPRESSION: 1. Mild generalized atrophy and moderate diffuse white matter disease. This likely reflects the sequela of chronic microvascular ischemia. 2. No acute intracranial abnormality or significant interval change. Electronically Signed   By: Wynetta Fines.D.  On: 08/28/2019 15:17     Scheduled Meds: . Chlorhexidine Gluconate Cloth  6 each Topical Daily  . diclofenac Sodium  2 g Topical QID  . [START ON 08/31/2019] enoxaparin (LOVENOX) injection  40 mg Subcutaneous Q24H  . insulin aspart  0-9 Units Subcutaneous Q6H  . nicotine  14 mg Transdermal Daily  . pantoprazole (PROTONIX) IV  40 mg Intravenous Daily  . sodium chloride flush  10-40 mL Intracatheter Q12H  . sodium chloride flush  3 mL Intravenous Q12H   Continuous Infusions: . TPN ADULT (ION) 75 mL/hr at 08/29/19 1805  . TPN ADULT (ION)       LOS: 7 days    Time spent: 58min  Domenic Polite, MD [facility]  08/30/2019, 11:36 AM

## 2019-08-30 NOTE — Progress Notes (Signed)
Pt refused 1700 CBG. Pt educated. Will continue to monitor.

## 2019-08-31 ENCOUNTER — Encounter: Payer: Self-pay | Admitting: *Deleted

## 2019-08-31 ENCOUNTER — Inpatient Hospital Stay (HOSPITAL_COMMUNITY): Payer: Medicare Other

## 2019-08-31 LAB — BASIC METABOLIC PANEL
Anion gap: 10 (ref 5–15)
BUN: 17 mg/dL (ref 8–23)
CO2: 22 mmol/L (ref 22–32)
Calcium: 8.3 mg/dL — ABNORMAL LOW (ref 8.9–10.3)
Chloride: 103 mmol/L (ref 98–111)
Creatinine, Ser: 0.47 mg/dL (ref 0.44–1.00)
GFR calc Af Amer: >60 mL/min (ref >60)
GFR calc non Af Amer: >60 mL/min (ref >60)
Glucose, Bld: 195 mg/dL — ABNORMAL HIGH (ref 70–99)
Potassium: 4.3 mmol/L (ref 3.5–5.1)
Sodium: 135 mmol/L (ref 135–145)

## 2019-08-31 LAB — CBC
HCT: 35.5 % — ABNORMAL LOW (ref 36.0–46.0)
Hemoglobin: 11.6 g/dL — ABNORMAL LOW (ref 12.0–15.0)
MCH: 30.4 pg (ref 26.0–34.0)
MCHC: 32.7 g/dL (ref 30.0–36.0)
MCV: 93.2 fL (ref 80.0–100.0)
Platelets: 279 10*3/uL (ref 150–400)
RBC: 3.81 MIL/uL — ABNORMAL LOW (ref 3.87–5.11)
RDW: 15 % (ref 11.5–15.5)
WBC: 10.7 10*3/uL — ABNORMAL HIGH (ref 4.0–10.5)
nRBC: 0 % (ref 0.0–0.2)

## 2019-08-31 LAB — GLUCOSE, CAPILLARY
Glucose-Capillary: 135 mg/dL — ABNORMAL HIGH (ref 70–99)
Glucose-Capillary: 155 mg/dL — ABNORMAL HIGH (ref 70–99)
Glucose-Capillary: 179 mg/dL — ABNORMAL HIGH (ref 70–99)
Glucose-Capillary: 206 mg/dL — ABNORMAL HIGH (ref 70–99)

## 2019-08-31 LAB — PHOSPHORUS: Phosphorus: 3.5 mg/dL (ref 2.5–4.6)

## 2019-08-31 LAB — MAGNESIUM: Magnesium: 2 mg/dL (ref 1.7–2.4)

## 2019-08-31 MED ORDER — ACETAMINOPHEN 500 MG PO TABS
500.0000 mg | ORAL_TABLET | Freq: Four times a day (QID) | ORAL | Status: DC
  Administered 2019-08-31 – 2019-09-01 (×4): 500 mg via ORAL
  Filled 2019-08-31 (×4): qty 1

## 2019-08-31 MED ORDER — ALBUTEROL SULFATE (2.5 MG/3ML) 0.083% IN NEBU
2.5000 mg | INHALATION_SOLUTION | Freq: Four times a day (QID) | RESPIRATORY_TRACT | Status: DC
  Administered 2019-08-31: 2.5 mg via RESPIRATORY_TRACT
  Filled 2019-08-31: qty 3

## 2019-08-31 MED ORDER — TRAVASOL 10 % IV SOLN
INTRAVENOUS | Status: AC
  Filled 2019-08-31: qty 900

## 2019-08-31 MED ORDER — ALBUTEROL SULFATE (2.5 MG/3ML) 0.083% IN NEBU
2.5000 mg | INHALATION_SOLUTION | Freq: Two times a day (BID) | RESPIRATORY_TRACT | Status: DC
  Administered 2019-09-01 – 2019-09-02 (×3): 2.5 mg via RESPIRATORY_TRACT
  Filled 2019-08-31 (×3): qty 3

## 2019-08-31 MED ORDER — IBUPROFEN 200 MG PO TABS: 400.0000 mg | ORAL_TABLET | Freq: Four times a day (QID) | ORAL | Status: DC | PRN

## 2019-08-31 MED ORDER — GUAIFENESIN ER 600 MG PO TB12
600.0000 mg | ORAL_TABLET | Freq: Two times a day (BID) | ORAL | Status: DC
  Administered 2019-08-31 – 2019-09-05 (×11): 600 mg via ORAL
  Filled 2019-08-31 (×11): qty 1

## 2019-08-31 MED ORDER — INSULIN ASPART 100 UNIT/ML ~~LOC~~ SOLN
0.0000 [IU] | Freq: Four times a day (QID) | SUBCUTANEOUS | Status: DC
  Administered 2019-08-31 – 2019-09-02 (×8): 2 [IU] via SUBCUTANEOUS
  Administered 2019-09-03: 3 [IU] via SUBCUTANEOUS
  Administered 2019-09-03 – 2019-09-04 (×4): 2 [IU] via SUBCUTANEOUS

## 2019-08-31 NOTE — Evaluation (Signed)
Physical Therapy Evaluation Patient Details Name: [name] MRN: [id number] DOB: Dec 26, 1931 Today's Date: 08/31/2019   History of Present Illness  86 yo female admitted with large bowel obstruction. S/P ex lap colectomy, colostomy 4/20. Imaging (+) sigmoid mass. Hx of bil BKA, uterine ca, osteoporosis  Clinical Impression  On eval, pt required Mod assist for bed mobility. She sat EOB for ~15 minutes with Min guard assist. LOB x 1 posteriorly when she was donning one of her prosthetic socks. Pt will likely be able to don prostheses with minimal assistance on next session. Will plan to have +2 assist to safely attempt standing, transfers and possibly ambulation if appropriate. No family present during session. Discussed d/c plan-pt is unsure at the moment. Will continue to assess and update recommendation as needed.     Follow Up Recommendations SNF vs Home health PT;Supervision/Assistance - 24 hour(depending on progress and assist available at home)    Equipment Recommendations  None recommended by PT    Recommendations for Other Services       Precautions / Restrictions Precautions Precautions: Fall Precaution Comments: bil BKA-has prostheses in room Restrictions Weight Bearing Restrictions: No      Mobility  Bed Mobility Overal bed mobility: Needs Assistance Bed Mobility: Supine to Sit;Sit to Supine     Supine to sit: Mod assist;HOB elevated Sit to supine: Mod assist;HOB elevated   General bed mobility comments: Cues for safety, technique. Increased time. Assist for trunk and to scoot to EOB utilizing bedpad. Sat  EOB for at least 15 minutes with Min guard assist. LOB posteriorly while donning prosthetic sock  Transfers                 General transfer comment: NT on today. Will have +2 assist next session when we attempt  Ambulation/Gait                Stairs            Wheelchair Mobility    Modified Rankin (Stroke Patients Only)        Balance Overall balance assessment: Needs assistance Sitting-balance support: Bilateral upper extremity supported Sitting balance-Leahy Scale: Fair                                       Pertinent Vitals/Pain Pain Assessment: Faces Faces Pain Scale: Hurts little more Pain Location: posterior neck, trapezius area, abdomen Pain Descriptors / Indicators: Discomfort;Sore Pain Intervention(s): Monitored during session;Repositioned    Home Living Family/patient expects to be discharged to:: Private residence Living Arrangements: Other relatives Available Help at Discharge: Family Type of Home: House Home Access: Ramped entrance     Home Layout: Able to live on main level with bedroom/bathroom Home Equipment: Walker - 2 wheels;Wheelchair - manual;Hospital bed;Transport chair;Bedside commode      Prior Function Level of Independence: Needs assistance   Gait / Transfers Assistance Needed: uses WC vs walking with prostheses (has been a while but within the last month). Pt stated she wears prostheses for transfers to/from [facility]  ADL's / Homemaking Assistance Needed: bathes/dresses with mod ind.  Comments: grandson(Byron) helps with errands, grocery shopping     Hand Dominance        Extremity/Trunk Assessment   Upper Extremity Assessment Upper Extremity Assessment: Defer to OT evaluation    Lower Extremity Assessment Lower Extremity Assessment: Generalized weakness    Cervical / Trunk Assessment Cervical /  Trunk Assessment: Kyphotic  Communication   Communication: (very hoarse)  Cognition Arousal/Alertness: Awake/alert Behavior During Therapy: WFL for tasks assessed/performed Overall Cognitive Status: Within Functional Limits for tasks assessed                                 General Comments: some mild difficulty remembering details      General Comments      Exercises     Assessment/Plan    PT Assessment Patient needs continued  PT services  PT Problem List Decreased strength;Decreased mobility;Decreased activity tolerance;Decreased balance;Decreased knowledge of use of DME;Pain       PT Treatment Interventions DME instruction;Gait training;Therapeutic activities;Therapeutic exercise;Patient/family education;Balance training;Functional mobility training    PT Goals (Current goals can be found in the Care Plan section)  Acute Rehab PT Goals Patient Stated Goal: to regain function and hopefully return home PT Goal Formulation: With patient Time For Goal Achievement: 09/14/19 Potential to Achieve Goals: Good    Frequency Min 3X/week   Barriers to discharge        Co-evaluation               AM-PAC PT "6 Clicks" Mobility  Outcome Measure Help needed turning from your back to your side while in a flat bed without using bedrails?: A Little Help needed moving from lying on your back to sitting on the side of a flat bed without using bedrails?: A Lot Help needed moving to and from a bed to a chair (including a wheelchair)?: A Lot Help needed standing up from a chair using your arms (e.g., wheelchair or bedside chair)?: A Lot Help needed to walk in hospital room?: Total Help needed climbing 3-5 steps with a railing? : Total 6 Click Score: 11    End of Session   Activity Tolerance: Patient tolerated treatment well Patient left: in bed;with call bell/phone within reach;with bed alarm set   PT Visit Diagnosis: Muscle weakness (generalized) (M62.81);Difficulty in walking, not elsewhere classified (R26.2)    Time: [id number] PT Time Calculation (min) (ACUTE ONLY): 42 min   Charges:   PT Evaluation $PT Eval Moderate Complexity: 1 Mod PT Treatments $Therapeutic Activity: 8-22 mins           Doreatha Massed, PT Acute Rehabilitation

## 2019-08-31 NOTE — Progress Notes (Addendum)
[facility] Progress Note  1 Day Post-Op  Subjective: CC:  No complaints. Denies pain. +stool in ostomy appliance. States she was too tired to eat/drink last night after surgery. We discussed her surgery yesterday. At baseline patient gets around in a wheelchair and her grandson helps her at home.  Objective: Vital signs in last 24 hours: Temp:  [97.5 F (36.4 C)-98.7 F (37.1 C)] 98.3 F (36.8 C) (04/21 0437) Pulse Rate:  [75-96] 96 (04/21 0437) Resp:  [17-31] 20 (04/21 0437) BP: (104-154)/(63-95) 115/69 (04/21 0437) SpO2:  [93 %-97 %] 97 % (04/21 0437) Weight:  [58.5 kg-58.9 kg] 58.5 kg (04/21 0555) Last BM Date: (pt states no BM in over 1 week)  Intake/Output from previous day: 04/20 0701 - 04/21 0700 In: 2954.1 [I.V.:2954.1] Out: 3625 [Urine:2075; Stool:1500; Blood:50] Intake/Output this shift: Total I/O In: -  Out: 300 [Urine:300]  PE: Gen:  Alert, NAD, pleasant HEENT: hoarse voice  Card:  Regular rate and rhythm, pedal pulses 2+ BL Pulm:  Normal effort, clear to auscultation bilaterally Abd: soft, appropriately TTP, honeycomb in place over midline incision C/D/I, LLQ stoma pink and viable with non-bloody stool in pouch (1500 cc.24h) Skin: warm and dry, no rashes  Psych: A&Ox3   Lab Results:  Recent Labs    08/29/19 0200 08/31/19 0348  WBC 8.8 10.7*  HGB 10.9* 11.6*  HCT 33.8* 35.5*  PLT 300 279   BMET Recent Labs    08/30/19 0445 08/31/19 0348  NA 134* 135  K 4.4 4.3  CL 104 103  CO2 24 22  GLUCOSE 122* 195*  BUN 16 17  CREATININE <0.30* 0.47  CALCIUM 8.2* 8.3*   PT/INR No results for input(s): LABPROT, INR in the last 72 hours. CMP     Component Value Date/Time   NA 135 08/31/2019 0348   K 4.3 08/31/2019 0348   CL 103 08/31/2019 0348   CO2 22 08/31/2019 0348   GLUCOSE 195 (H) 08/31/2019 0348   BUN 17 08/31/2019 0348   CREATININE 0.47 08/31/2019 0348   CREATININE 0.44 (L) 04/14/2017 0859   CALCIUM 8.3 (L) 08/31/2019 0348    PROT 5.2 (L) 08/29/2019 0200   ALBUMIN 2.6 (L) 08/29/2019 0200   AST 24 08/29/2019 0200   ALT 15 08/29/2019 0200   ALKPHOS 87 08/29/2019 0200   BILITOT 0.3 08/29/2019 0200   GFRNONAA >60 08/31/2019 0348   GFRAA >60 08/31/2019 0348   Lipase     Component Value Date/Time   LIPASE 18 08/23/2019 1404       Studies/Results: No results found.  Anti-infectives: Anti-infectives (From admission, onward)   Start     Dose/Rate Route Frequency Ordered Stop   08/30/19 1144  sodium chloride 0.9 % with cefoTEtan (CEFOTAN) ADS Med  Status:  Discontinued    Note to Pharmacy: Georgena Spurling   : cabinet override      08/30/19 1144 08/30/19 1247   08/30/19 0600  cefoTEtan (CEFOTAN) 2 g in sodium chloride 0.9 % 100 mL IVPB     2 g 200 mL/hr over 30 Minutes Intravenous On call to O.R. 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 Marlou Starks Continue TNA, CLD this AM, advance to soft as tolerated. Scheduled tylenol for pain, PRN  ibuprofen  PT/OT WOC RN consult for ostomy care   FEN: NPO, CLD advance to SOFT as tolerated ID: perioperative ancef VTE: SCD's, Lovenox   LOS: 8 days    Obie Dredge, [facility] Pager: [phone]

## 2019-08-31 NOTE — Consult Note (Addendum)
[facility] Nurse ostomy consult note: POD 1 Stoma type/location: LLQ colosotmy Stomal assessment/size: 1 and 1/2 inches, slightly oval. Dusky, but viable. Edematous. Peristomal assessment: Intact Treatment options for stomal/peristomal skin: convex pouch (no ring at this first pouch change) Output: liquid dark brown stool is leaking at distal end of pouching system and under honeycomb dressing covering midline wound Ostomy pouching: 1pc.convex pouching system [place] # [phone] Education provided: None today.  Patient is groggy and with sore throat from anesthesia. Falls asleep during pouch change. Enrolled patient in [place] program: None today. Not able to sign Secure Start card.  Education materials left at bedside with 3 pouches, pattern and measuring guide. Note from physician preop requests that son be involved in patient teaching in the event she is able to return home.  [facility] nursing team will follow, and will remain available to this patient, the nursing surgical and medical teams.  Thanks, Maudie Flakes, MSN, RN, [place], Arther Abbott  Pager# [phone]

## 2019-08-31 NOTE — Progress Notes (Signed)
RN notified pt's ostomy bad is leaking and possibly has a hole in it. RN assessed bag. Page sent to [facility] nurse to come assess and replace bag. RN also phoned materials but unable to locate bag number. Waiting for response from wound care nurse.

## 2019-08-31 NOTE — Progress Notes (Signed)
RN called pt's grandson to provide update. Grandson stated he hasn't heard back from any MD to explain the surgery performed. MD notified to call grandson to provide update.

## 2019-08-31 NOTE — Anesthesia Postprocedure Evaluation (Signed)
Anesthesia Post Note  Patient: [name]  Procedure(s) Performed: EXPLORATORY LAPAROTOMY WITH SIGMOID RESECTION AND COLOSTOMY PLACEMENT (N/A )     Patient location during evaluation: PACU Anesthesia Type: General Level of consciousness: awake and alert Pain management: pain level controlled Vital Signs Assessment: post-procedure vital signs reviewed and stable Respiratory status: spontaneous breathing, nonlabored ventilation, respiratory function stable and patient connected to nasal cannula oxygen Cardiovascular status: blood pressure returned to baseline and stable Postop Assessment: no apparent nausea or vomiting Anesthetic complications: no    Last Vitals:  Vitals:   08/31/19 0437 08/31/19 1203  BP: 115/69   Pulse: 96 94  Resp: 20   Temp: 36.8 C   SpO2: 97%     Last Pain:  Vitals:   08/30/19 2113  TempSrc:   PainSc: 0-No pain                 Rheya Minogue S

## 2019-08-31 NOTE — Progress Notes (Signed)
PHARMACY - TOTAL PARENTERAL NUTRITION CONSULT NOTE   Indication: sigmoid colon mass with large bowel obstruction  Patient Measurements: Height: 4' 2.25" (127.6 cm) Weight: 58.5 kg (128 lb 15.5 oz) IBW/kg (Calculated) : 23.08 TPN AdjBW (KG): 32 Body mass index is 35.91 kg/m.   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 Pharmacy consulted for TPN.  Glucose / Insulin: No hx of diabetes. CBG goal <150. 5 units SSI in past 24 hours. Dexamethasone 5 mg IV given on 4/20. CBG 93-206 are above goal Electrolytes: WNL, including CorrCa 9.4. (goal K+ =/> 4, Mag =/> 2).  Renal: SCr low, baseline. Stable. BUN WNL LFTs / TGs: LFTs WNL, Triglycerides 43 (4/19) Prealbumin / albumin: Low- 5.4 (4/16) >> 5.8 (4/19)/ 2.6 Intake / Output; MIVF:  I/O - 670; 1500 mL stool output  (2 unmeasured urine occurrences). LR stopped 4/18.  GI Imaging: 4/13 CT abdomen: infiltrating sigmoid colon mass suspicious for colon CA resulting in obstruction.  Surgeries / Procedures:  4/15: flex sig 4/20: exploratory laparotomy, sigmoid colectomy with descending colostomy  Central access: PICC line placed 08/26/19 TPN start date: 08/26/19  Nutritional Goals (per RD recommendation on 08/26/19): kCal: 1750-1950, Protein: 86-100 g/day, Fluid: >1.7L/day  Goal TPN rate is 75 mL/hr (provides 90 g of protein and 1818 kCal per day)  Current Nutrition:  -CLD  -Boost Breeze TID resumed 4/20. None charted. -TPN  Plan:  At 1800:  Continue TPN at goal rate of 20mL/hr with 15% dextrose, 30g/L lipids, 50g/L protein.  Electrolytes in TPN: No changes  85mEq/L of Na, 83mEq/L of K, 7mEq/L of Ca14mEq/L of Mag, and 46mmol/L of Phos. Cl:Ac ratio 1:1.   Standard MVI and trace elements in TPN.   Increase SSI to moderate q6h and adjust as needed.   MIVF management per MD (LR discontinued 4/18).     Monitor TPN labs on Mon/Thurs.  Lenis Noon, PharmD 08/31/19 7:51 AM

## 2019-08-31 NOTE — Progress Notes (Signed)
Pt entered yellow mew d/t tachycardia noted by telemetry. Pt has developed a cough with thick secretions, during cough, RN noted pt's heart to increase before returning back to NSR. Robitussin PRN given, and MD notified. See new orders. Will continue to monitor patient. HR currently 94 at rest.

## 2019-08-31 NOTE — Progress Notes (Signed)
PROGRESS NOTE    [name]  [id number] DOB: [birth year] DOA: 08/23/2019 PCP: [name], MD   Brief Narrative: 86 year old with past medical history significant for PAD status post bilateral BKA.  She was admitted on 4/13 with large bowel obstruction.  CT showed sigmoid colon mass with distal colonic obstruction with metastasis to liver.  Surgery following and GI was consulted. Patient underwent flex sig 4/15 had a malignant partially obstructing tumor of the sigmoid colon, pathology with adenocarcinoma. -She was a started on TPN, hospital course complicated by delirium, patient underwent exploratory laparotomy, sigmoid colectomy with descending colostomy on 4/20.   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  Patient was n.p.o., received IV fluids and supportive care. -Underwent flex sigmoidoscopy on 4/15 with malignant partially obstructing tumor in the sigmoid colon, biopsy, pathology showed adenocarcinoma. -General surgery following -Started on TPN on 4/17 -Underwent exploratory laparotomy and sigmoid colectomy with descending colostomy on 4/20. -Started on clear diet. -Oncology consulted.  2-Delirium, agitation from 417 until 4/18 Patient developed hospital delirium, in the setting of IV narcotics, worsened after overnight dose of Haldol for agitation. He is improved.  CT head unremarkable, no evidence of infection, ABG without hypercapnia Patient is stable today calm and cooperative.  3-Dehydration: Resolved with IV fluids and TPN 4-Cough: Start nebulizer treatment, guaifenesin, check chest x-ray.  Flutter valve.  5-history of PAD: Bilateral BKA.  Plavix on hold resume when okay by surgery. 6-Hypertension:  Continue to hold lisinopril. 7-GERD: Continue with PPI   Nutrition Problem: Inadequate oral intake Etiology: altered GI function(sigmoid colon mass with large distal colonic obstruction)    Signs/Symptoms: NPO status    Interventions: Refer to RD note for recommendations  Estimated body mass index is 35.91 kg/m as calculated from the following:   Height as of this encounter: 4' 2.25" (1.276 m).   Weight as of this encounter: 58.5 kg.   DVT prophylaxis: Lovenox Code Status: DNR Family Communication: No family at time of rounding Disposition Plan:  Patient is from: Home Anticipated d/c date: 2 or 3 days when she is able to tolerate diet.  Awaiting PT evaluation Barriers to d/c or necessity for inpatient status: Patient status post sigmoid colectomy day 1, started on clear diet.  Still requiring TPN.  Consultants:   General surgery  Oncology  Procedures:   Sigmoid colectomy  Antimicrobials:  None  Subjective: Patient report cough since yesterday, productive, thick secretion pinkish color.  Denies shortness of breath. Denies difficulty swallowing clear liquids Objective: Vitals:   08/30/19 1545 08/30/19 2015 08/31/19 0437 08/31/19 0555  BP: 104/78 137/84 115/69   Pulse: 81 91 96   Resp: (!) 22 17 20    Temp: 98.3 F (36.8 C) 98.6 F (37 C) 98.3 F (36.8 C)   TempSrc:      SpO2: 95%  97%   Weight:    58.5 kg  Height:        Intake/Output Summary (Last 24 hours) at 08/31/2019 1119 Last data filed at 08/31/2019 0914 Gross per 24 hour  Intake 3199.14 ml  Output 2925 ml  Net 274.14 ml   Filed Weights   08/30/19 0500 08/30/19 1200 08/31/19 0555  Weight: 58.9 kg 58.9 kg 58.5 kg    Examination:  General exam:  Appears calm and comfortable  Respiratory system: Bilateral rhonchorous Cardiovascular system: S1 & S2 heard, RRR. No JVD, murmurs, rubs, gallops or clicks. No pedal edema. Gastrointestinal system: Abdomen is nondistended, soft and nontender. No  organomegaly or masses felt. Normal bowel sounds heard. Central nervous system: Alert and oriented.  Extremities: Symmetric 5 x 5 power. Skin: No rashes, lesions or ulcers    Data Reviewed: I have personally reviewed following labs and imaging studies  CBC: Recent Labs  Lab 08/26/19 0532 08/27/19 0410 08/28/19 0934 08/29/19 0200 08/31/19 0348  WBC 5.9 7.2 7.0 8.8 10.7*  NEUTROABS  --  5.2  --  6.2  --   HGB 10.4* 10.9* 11.0* 10.9* 11.6*  HCT 32.9* 34.0* 33.9* 33.8* 35.5*  MCV 94.0 92.9 92.6 93.4 93.2  PLT 286 300 297 300 [id number]   Basic Metabolic Panel: Recent Labs  Lab 08/27/19 0410 08/28/19 0146 08/29/19 0200 08/30/19 0445 08/31/19 0348  NA 137 136 137 134* 135  K 3.5 4.0 4.2 4.4 4.3  CL 106 105 105 104 103  CO2 25 24 24 24 22   GLUCOSE 137* 118* 95 122* 195*  BUN 10 10 12 16 17   CREATININE 0.30* 0.35* <0.30* <0.30* 0.47  CALCIUM 8.1* 7.8* 8.1* 8.2* 8.3*  MG 1.9 2.0 2.0 2.0 2.0  PHOS 2.7 3.0 3.5 3.9 3.5   GFR: Estimated Creatinine Clearance: 30.3 mL/min (by C-G formula based on SCr of 0.47 mg/dL). Liver Function Tests: Recent Labs  Lab 08/25/19 0558 08/27/19 0410 08/28/19 0146 08/29/19 0200  AST 27 24 26 24   ALT 16 16 17 15   ALKPHOS 94 95 87 87  BILITOT 1.0 0.8 0.4 0.3  PROT 5.5* 5.1* 5.1* 5.2*  ALBUMIN 2.8* 2.5* 2.5* 2.6*   No results for input(s): LIPASE, AMYLASE in the last 168 hours. No results for input(s): AMMONIA in the last 168 hours. Coagulation Profile: Recent Labs  Lab 08/26/19 0532  INR 1.2   Cardiac Enzymes: No results for input(s): CKTOTAL, CKMB, CKMBINDEX, TROPONINI in the last 168 hours. BNP (last 3 results) No results for input(s): PROBNP in the last 8760 hours. HbA1C: No results for input(s): HGBA1C in the last 72 hours. CBG: Recent Labs  Lab 08/30/19 0557 08/30/19 1200 08/30/19 1325 08/31/19 0012 08/31/19 0549  GLUCAP 135* 93 133* 206* 179*   Lipid Profile: Recent Labs    08/29/19 0200  TRIG 43   Thyroid Function  Tests: No results for input(s): TSH, T4TOTAL, FREET4, T3FREE, THYROIDAB in the last 72 hours. Anemia Panel: No results for input(s): VITAMINB12, FOLATE, FERRITIN, TIBC, IRON, RETICCTPCT in the last 72 hours. Sepsis Labs: No results for input(s): PROCALCITON, LATICACIDVEN in the last 168 hours.  Recent Results (from the past 240 hour(s))  Respiratory Panel by RT PCR (Flu A&B, Covid) - Nasopharyngeal Swab     Status: None   Collection Time: 08/23/19  6:35 PM   Specimen: Nasopharyngeal Swab  Result Value Ref Range Status   SARS Coronavirus 2 by RT PCR NEGATIVE NEGATIVE Final    Comment: (NOTE) SARS-CoV-2 target nucleic acids are NOT DETECTED. The SARS-CoV-2 RNA is generally detectable in upper respiratoy specimens during the acute phase of infection. The lowest concentration of SARS-CoV-2 viral copies this assay can detect is 131 copies/mL. A negative result does not preclude SARS-Cov-2 infection and should not be used as the sole basis for treatment or other patient management decisions. A negative result may occur with  improper specimen collection/handling, submission of specimen other than nasopharyngeal swab, presence  of viral mutation(s) within the areas targeted by this assay, and inadequate number of viral copies (<131 copies/mL). A negative result must be combined with clinical observations, patient history, and epidemiological information. The expected result is Negative. Fact Sheet for Patients:  PinkCheek.be Fact Sheet for Healthcare Providers:  GravelBags.it This test is not yet ap proved or cleared by the [place] FDA and  has been authorized for detection and/or diagnosis of SARS-CoV-2 by FDA under an Emergency Use Authorization (EUA). This EUA will remain  in effect (meaning this test can be used) for the duration of the COVID-19 declaration under Section 564(b)(1) of the Act, 21 U.S.C. section  360bbb-3(b)(1), unless the authorization is terminated or revoked sooner.    Influenza A by PCR NEGATIVE NEGATIVE Final   Influenza B by PCR NEGATIVE NEGATIVE Final    Comment: (NOTE) The Xpert Xpress SARS-CoV-2/FLU/RSV assay is intended as an aid in  the diagnosis of influenza from Nasopharyngeal swab specimens and  should not be used as a sole basis for treatment. Nasal washings and  aspirates are unacceptable for Xpert Xpress SARS-CoV-2/FLU/RSV  testing. Fact Sheet for Patients: PinkCheek.be Fact Sheet for Healthcare Providers: GravelBags.it This test is not yet approved or cleared by the [place] FDA and  has been authorized for detection and/or diagnosis of SARS-CoV-2 by  FDA under an Emergency Use Authorization (EUA). This EUA will remain  in effect (meaning this test can be used) for the duration of the  Covid-19 declaration under Section 564(b)(1) of the Act, 21  U.S.C. section 360bbb-3(b)(1), unless the authorization is  terminated or revoked. Performed at [facility], [place] [address] [place], [place] [address]   Culture, Urine     Status: None   Collection Time: 08/28/19 12:25 PM   Specimen: Urine, Random  Result Value Ref Range Status   Specimen Description   Final    URINE, RANDOM Performed at [facility] [address] [place], [place] [address]    Special Requests   Final    NONE Performed at [facility], [place] [address], [place], [place] [address]    Culture   Final    NO GROWTH Performed at [facility], [place] [address], [place], [place] [address]    Report Status 08/29/2019 FINAL  Final         Radiology Studies: No results found.      Scheduled Meds: . acetaminophen  500 mg Oral Q6H  . albuterol  2.5 mg Nebulization Q6H  . Chlorhexidine Gluconate Cloth  6 each Topical Daily  . diclofenac Sodium  2 g Topical QID  .  enoxaparin (LOVENOX) injection  40 mg Subcutaneous Q24H  . feeding supplement  1 Container Oral TID BM  . guaiFENesin  600 mg Oral BID  . insulin aspart  0-15 Units Subcutaneous Q6H  . nicotine  14 mg Transdermal Daily  . pantoprazole (PROTONIX) IV  40 mg Intravenous Daily  . sodium chloride flush  10-40 mL Intracatheter Q12H  . sodium chloride flush  3 mL Intravenous Q12H   Continuous Infusions: . TPN ADULT (ION) 75 mL/hr at 08/30/19 1754  . TPN ADULT (ION)       LOS: 8 days    Time spent: 35 unit     Jash Wahlen A Marceil Welp, MD [facility]   If 7PM-7AM, please contact night-coverage [email or web address]  08/31/2019, 11:19 AM

## 2019-09-01 ENCOUNTER — Encounter (HOSPITAL_COMMUNITY): Payer: Self-pay | Admitting: Internal Medicine

## 2019-09-01 DIAGNOSIS — C787 Secondary malignant neoplasm of liver and intrahepatic bile duct: Secondary | ICD-10-CM

## 2019-09-01 DIAGNOSIS — C189 Malignant neoplasm of colon, unspecified: Secondary | ICD-10-CM

## 2019-09-01 LAB — COMPREHENSIVE METABOLIC PANEL
ALT: 13 U/L (ref 0–44)
AST: 20 U/L (ref 15–41)
Albumin: 2 g/dL — ABNORMAL LOW (ref 3.5–5.0)
Alkaline Phosphatase: 64 U/L (ref 38–126)
Anion gap: 7 (ref 5–15)
BUN: 17 mg/dL (ref 8–23)
CO2: 22 mmol/L (ref 22–32)
Calcium: 8.3 mg/dL — ABNORMAL LOW (ref 8.9–10.3)
Chloride: 103 mmol/L (ref 98–111)
Creatinine, Ser: <0.3 mg/dL — ABNORMAL LOW (ref 0.44–1.00)
Glucose, Bld: 128 mg/dL — ABNORMAL HIGH (ref 70–99)
Potassium: 4.9 mmol/L (ref 3.5–5.1)
Sodium: 132 mmol/L — ABNORMAL LOW (ref 135–145)
Total Bilirubin: 0.4 mg/dL (ref 0.3–1.2)
Total Protein: 4.7 g/dL — ABNORMAL LOW (ref 6.5–8.1)

## 2019-09-01 LAB — MAGNESIUM: Magnesium: 1.9 mg/dL (ref 1.7–2.4)

## 2019-09-01 LAB — GLUCOSE, CAPILLARY
Glucose-Capillary: 121 mg/dL — ABNORMAL HIGH (ref 70–99)
Glucose-Capillary: 125 mg/dL — ABNORMAL HIGH (ref 70–99)
Glucose-Capillary: 128 mg/dL — ABNORMAL HIGH (ref 70–99)
Glucose-Capillary: 129 mg/dL — ABNORMAL HIGH (ref 70–99)
Glucose-Capillary: 136 mg/dL — ABNORMAL HIGH (ref 70–99)

## 2019-09-01 LAB — PHOSPHORUS: Phosphorus: 2.7 mg/dL (ref 2.5–4.6)

## 2019-09-01 MED ORDER — TRAVASOL 10 % IV SOLN
INTRAVENOUS | Status: AC
  Filled 2019-09-01: qty 900

## 2019-09-01 MED ORDER — ACETAMINOPHEN 325 MG PO TABS
650.0000 mg | ORAL_TABLET | Freq: Four times a day (QID) | ORAL | Status: DC
  Administered 2019-09-01 – 2019-09-05 (×15): 650 mg via ORAL
  Filled 2019-09-01 (×15): qty 2

## 2019-09-01 MED ORDER — MAGNESIUM SULFATE IN D5W 1-5 GM/100ML-% IV SOLN
1.0000 g | Freq: Once | INTRAVENOUS | Status: AC
  Administered 2019-09-01: 1 g via INTRAVENOUS
  Filled 2019-09-01: qty 100

## 2019-09-01 MED ORDER — LIDOCAINE 5 % EX PTCH
1.0000 | MEDICATED_PATCH | CUTANEOUS | Status: DC
  Administered 2019-09-01 – 2019-09-05 (×5): 1 via TRANSDERMAL
  Filled 2019-09-01 (×5): qty 1

## 2019-09-01 MED ORDER — NYSTATIN 100000 UNIT/ML MT SUSP
5.0000 mL | Freq: Four times a day (QID) | OROMUCOSAL | Status: DC
  Administered 2019-09-01 – 2019-09-05 (×17): 500000 [IU] via ORAL
  Filled 2019-09-01 (×17): qty 5

## 2019-09-01 MED ORDER — SODIUM PHOSPHATES 45 MMOLE/15ML IV SOLN
10.0000 mmol | Freq: Once | INTRAVENOUS | Status: AC
  Administered 2019-09-01: 10 mmol via INTRAVENOUS
  Filled 2019-09-01: qty 3.33

## 2019-09-01 NOTE — Progress Notes (Signed)
PROGRESS NOTE    SIGNA LEMPKA  [id number] DOB: [birth year] DOA: 08/23/2019 PCP: [name], MD   Brief Narrative: 86 year old with past medical history significant for PAD status post bilateral BKA.  She was admitted on 4/13 with large bowel obstruction.  CT showed sigmoid colon mass with distal colonic obstruction with metastasis to liver.  Surgery following and GI was consulted. Patient underwent flex sig 4/15 had a malignant partially obstructing tumor of the sigmoid colon, pathology with adenocarcinoma. -She was a started on TPN, hospital course complicated by delirium, patient underwent exploratory laparotomy, sigmoid colectomy with descending colostomy on 4/20.   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  Patient was n.p.o., received IV fluids and supportive care. -Underwent flex sigmoidoscopy on 4/15 with malignant partially obstructing tumor in the sigmoid colon, biopsy, pathology showed adenocarcinoma. -General surgery following -Started on TPN on 4/17 -Underwent exploratory laparotomy and sigmoid colectomy with descending colostomy on 4/20. -Started on clear diet. Plan to advance diet to soft. Wean of TPN when possible.  -Oncology consulted.  2-Delirium, agitation from 417 until 4/18 Patient developed hospital delirium, in the setting of IV narcotics, worsened after overnight dose of Haldol for agitation. He is improved.  CT head unremarkable, no evidence of infection, ABG without hypercapnia stable  3-Dehydration: Resolved with IV fluids and TPN 4-Cough: Start nebulizer treatment, guaifenesin, chest x ray negative for PNA.  Flutter valve.  5-history of PAD: Bilateral BKA.  Plavix on hold resume when okay  by surgery. 6-Hypertension: Continue to hold lisinopril. 7-GERD: Continue with PPI 8-Oral thrush; start Nystatin   Nutrition Problem: Inadequate oral intake Etiology: altered GI function(sigmoid colon mass with large distal colonic obstruction)    Signs/Symptoms: NPO status    Interventions: Refer to RD note for recommendations  Estimated body mass index is 38.24 kg/m as calculated from the following:   Height as of this encounter: 4' 2.25" (1.276 m).   Weight as of this encounter: 62.3 kg.   DVT prophylaxis: Lovenox Code Status: DNR Family Communication: Grandson 4/21 Disposition Plan:  Patient is from: Home Anticipated d/c date: 2  days when she is able to tolerate diet and is wean off TPN.  Barriers to d/c or necessity for inpatient status: Patient status post sigmoid colectomy day 2, Advancing diet today,  requiring TPN.  Consultants:   General surgery  Oncology  Procedures:   Sigmoid colectomy  Antimicrobials:  None  Subjective: She is still coughing, but feels better.  Report mild abdominal discomfort.   Objective: Vitals:   09/01/19 0500 09/01/19 0524 09/01/19 0834 09/01/19 1346  BP:  104/64  (!) 102/56  Pulse:  91  84  Resp:  16  19  Temp:  98.5 F (36.9 C)  98.3 F (36.8 C)  TempSrc:  Oral  Oral  SpO2:  96% 94% 99%  Weight: 62.3 kg     Height:        Intake/Output Summary (Last 24 hours) at 09/01/2019 1629 Last data filed at 09/01/2019 1500 Gross per 24 hour  Intake 2897.71 ml  Output 400 ml  Net 2497.71 ml   Filed Weights   08/30/19 1200 08/31/19 0555 09/01/19 0500  Weight: 58.9 kg 58.5 kg 62.3 kg    Examination:  General exam: NAD  Respiratory system: Less ronchus Cardiovascular system: S 1, S 2 RRR Gastrointestinal system: BS present, soft, nt Central nervous system: Alert and oriented  Extremities: Symmetric power Skin: No rashes    Data Reviewed: I have personally reviewed following labs and imaging  studies  CBC: Recent Labs  Lab 08/26/19 0532 08/27/19 0410 08/28/19 0934 08/29/19 0200 08/31/19 0348  WBC 5.9 7.2 7.0 8.8 10.7*  NEUTROABS  --  5.2  --  6.2  --   HGB 10.4* 10.9* 11.0* 10.9* 11.6*  HCT 32.9* 34.0* 33.9* 33.8* 35.5*  MCV 94.0 92.9 92.6 93.4 93.2  PLT 286 300 297 300 [id number]   Basic Metabolic Panel: Recent Labs  Lab 08/28/19 0146 08/29/19 0200 08/30/19 0445 08/31/19 0348 09/01/19 0357  NA 136 137 134* 135 132*  K 4.0 4.2 4.4 4.3 4.9  CL 105 105 104 103 103  CO2 24 24 24 22 22   GLUCOSE 118* 95 122* 195* 128*  BUN 10 12 16 17 17   CREATININE 0.35* <0.30* <0.30* 0.47 <0.30*  CALCIUM 7.8* 8.1* 8.2* 8.3* 8.3*  MG 2.0 2.0 2.0 2.0 1.9  PHOS 3.0 3.5 3.9 3.5 2.7   GFR: CrCl cannot be calculated (This lab value cannot be used to calculate CrCl because it is not a number: <0.30). Liver Function Tests: Recent Labs  Lab 08/27/19 0410 08/28/19 0146 08/29/19 0200 09/01/19 0357  AST 24 26 24 20   ALT 16 17 15 13   ALKPHOS 95 87 87 64  BILITOT 0.8 0.4 0.3 0.4  PROT 5.1* 5.1* 5.2* 4.7*  ALBUMIN 2.5* 2.5* 2.6* 2.0*   No results for input(s): LIPASE, AMYLASE in the last 168 hours. No results for input(s): AMMONIA in the last 168 hours. Coagulation Profile: Recent Labs  Lab 08/26/19 0532  INR 1.2   Cardiac Enzymes: No results for input(s): CKTOTAL, CKMB, CKMBINDEX, TROPONINI in the last 168 hours. BNP (last 3 results) No results for input(s): PROBNP in the last 8760 hours. HbA1C: No results for input(s): HGBA1C in the last 72 hours. CBG: Recent Labs  Lab 08/31/19 1235 08/31/19 1749 09/01/19 0015 09/01/19 0528 09/01/19 1155  GLUCAP 155* 135* 121* 129* 128*   Lipid Profile: No results for input(s): CHOL, HDL, LDLCALC, TRIG, CHOLHDL, LDLDIRECT in the last 72 hours. Thyroid Function Tests: No results for input(s): TSH, T4TOTAL, FREET4, T3FREE, THYROIDAB in the last 72 hours. Anemia Panel: No results for input(s): VITAMINB12, FOLATE, FERRITIN, TIBC,  IRON, RETICCTPCT in the last 72 hours. Sepsis Labs: No results for input(s): PROCALCITON, LATICACIDVEN in the last 168 hours.  Recent Results (from the past 240 hour(s))  Respiratory Panel by RT PCR (Flu A&B, Covid) - Nasopharyngeal Swab     Status: None   Collection Time: 08/23/19  6:35 PM   Specimen: Nasopharyngeal Swab  Result Value Ref Range Status   SARS Coronavirus 2 by RT PCR NEGATIVE NEGATIVE Final    Comment: (NOTE) SARS-CoV-2 target nucleic acids are NOT DETECTED. The SARS-CoV-2 RNA is generally detectable in upper respiratoy specimens during the acute phase of infection. The lowest concentration of SARS-CoV-2 viral copies this assay can detect is 131 copies/mL. A negative result does not preclude SARS-Cov-2 infection and should not be used as the sole basis for treatment or other patient management decisions. A negative result may occur with  improper specimen collection/handling, submission of specimen other than nasopharyngeal swab, presence of viral mutation(s) within the areas targeted by this assay, and inadequate number of viral copies (<131 copies/mL). A negative result must be  combined with clinical observations, patient history, and epidemiological information. The expected result is Negative. Fact Sheet for Patients:  PinkCheek.be Fact Sheet for Healthcare Providers:  GravelBags.it This test is not yet ap proved or cleared by the [place] FDA and  has been authorized for detection and/or diagnosis of SARS-CoV-2 by FDA under an Emergency Use Authorization (EUA). This EUA will remain  in effect (meaning this test can be used) for the duration of the COVID-19 declaration under Section 564(b)(1) of the Act, 21 U.S.C. section 360bbb-3(b)(1), unless the authorization is terminated or revoked sooner.    Influenza A by PCR NEGATIVE NEGATIVE Final   Influenza B by PCR NEGATIVE NEGATIVE Final    Comment:  (NOTE) The Xpert Xpress SARS-CoV-2/FLU/RSV assay is intended as an aid in  the diagnosis of influenza from Nasopharyngeal swab specimens and  should not be used as a sole basis for treatment. Nasal washings and  aspirates are unacceptable for Xpert Xpress SARS-CoV-2/FLU/RSV  testing. Fact Sheet for Patients: PinkCheek.be Fact Sheet for Healthcare Providers: GravelBags.it This test is not yet approved or cleared by the [place] FDA and  has been authorized for detection and/or diagnosis of SARS-CoV-2 by  FDA under an Emergency Use Authorization (EUA). This EUA will remain  in effect (meaning this test can be used) for the duration of the  Covid-19 declaration under Section 564(b)(1) of the Act, 21  U.S.C. section 360bbb-3(b)(1), unless the authorization is  terminated or revoked. Performed at [facility], [place] [address] [place], [place] [address]   Culture, Urine     Status: None   Collection Time: 08/28/19 12:25 PM   Specimen: Urine, Random  Result Value Ref Range Status   Specimen Description   Final    URINE, RANDOM Performed at [facility] [address] [place], [place] [address]    Special Requests   Final    NONE Performed at [facility], [place] [address] [place], [place] [address]    Culture   Final    NO GROWTH Performed at [facility], [place] [address], [place], [place] [address]    Report Status 08/29/2019 FINAL  Final         Radiology Studies: DG CHEST PORT 1 VIEW  Result Date: 08/31/2019 CLINICAL DATA:  Cough. New diagnosis of adenocarcinoma of the sigmoid colon. EXAM: PORTABLE CHEST 1 VIEW COMPARISON:  None. FINDINGS: Right-sided PICC line is difficult to follow centrally, likely terminating at the superior caval/atrial junction. Patient rotated left. Mild cardiomegaly. Atherosclerosis in the transverse aorta. No pleural effusion  or pneumothorax. Biapical pleural thickening. Clear lungs. IMPRESSION: No acute cardiopulmonary disease. Aortic Atherosclerosis (ICD10-I70.0). Electronically Signed   By: Abigail Miyamoto M.D.   On: 08/31/2019 14:09        Scheduled Meds: . acetaminophen  650 mg Oral Q6H  . albuterol  2.5 mg Nebulization BID  . Chlorhexidine Gluconate Cloth  6 each Topical Daily  . diclofenac Sodium  2 g Topical QID  . enoxaparin (LOVENOX) injection  40 mg Subcutaneous Q24H  . feeding supplement  1 Container Oral TID BM  . guaiFENesin  600 mg Oral BID  . insulin aspart  0-15 Units Subcutaneous Q6H  . lidocaine  1 patch Transdermal Q24H  . nicotine  14 mg Transdermal Daily  . nystatin  5 mL Oral QID  . pantoprazole (PROTONIX) IV  40 mg Intravenous Daily  . sodium chloride flush  10-40 mL Intracatheter Q12H  .  sodium chloride flush  3 mL Intravenous Q12H   Continuous Infusions: . TPN ADULT (ION) 75 mL/hr at 08/31/19 1733  . TPN ADULT (ION)       LOS: 9 days    Time spent: 35 unit     Sajjad Honea A Adel Burch, MD [facility]   If 7PM-7AM, please contact night-coverage [email or web address]  09/01/2019, 4:29 PM

## 2019-09-01 NOTE — Care Management Important Message (Signed)
Important Message  Patient Details IM Letter given to [name] SW Case Manager to present to the Patient Name: [name] MRN: [id number] Date of Birth: 05/21/1931   Medicare Important Message Given:  Yes     [name], [name] 09/01/2019, 11:04 AM

## 2019-09-01 NOTE — Consult Note (Addendum)
[facility]  [phone] [phone]   MEDICAL ONCOLOGY - INITIAL CONSULTATION  Referral MD: Dr. 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  The patient came to the emergency room for abdominal pain which have been present for 3 to 4 days.  This was present over her lower abdomen and was worsening.  She had not had a bowel movement for 3 to 4 days.  A CT of the abdomen pelvis with contrast was performed which showed an infiltrating sigmoid colon mass concerning for colon cancer with resulting distal colonic obstruction as well as a large peripherally enhancing mass involving the right hepatic lobe with additional smaller lesions in the right hepatic lobe concerning for metastatic colon cancer.  The patient was seen by general surgery and gastroenterology.  She had a flexible sigmoidoscopy performed on 08/25/2019 showed a malignant partially obstructing tumor in the sigmoid colon which was biopsied.  The biopsy was consistent with adenocarcinoma.  Mismatch repair protein was normal.  The patient was taken to the OR on 08/30/2019 due to her obstructing sigmoid mass and underwent exploratory laparotomy, sigmoid colectomy with descending colostomy.  Final surgical pathology is pending.  When seen today, no family is at the bedside.  The patient reports that her grandson helps her make medical decisions.  The patient reports that prior to admission she had not really noticed much of an appetite loss but states that she lost weight and noticed this only when she was weighed on admission.  She is unsure how much weight she has lost.  She reports her abdominal pain has improved since surgery.  She was started on a regular diet today.  So far she is  tolerating this well.  She denies nausea and vomiting.  She has not noticed any melena or hematochezia prior to admission.  Denies prior history of colonoscopy.  She denies headaches, but has some dizziness.  Denies chest pain or shortness of breath.  Denies lower extremity edema.  The patient is widowed.  She lives by herself but her grandson lives at home just behind her.  She had 1 son who is deceased.  Denies history of alcohol and tobacco use.  No family history of cancer or blood disorders.  Medical oncology was asked see the patient to make recommendations regarding her newly diagnosed adenocarcinoma of the sigmoid colon and liver mass.   Past Medical History:  Diagnosis Date  . Arthritis   . Cancer (HCC)    utertrine  . Hypertension   . Osteoporosis 04/15/2017  . PAD (peripheral artery disease) (HCC)   :  Past Surgical History:  Procedure Laterality Date  . ABDOMINAL HYSTERECTOMY     partial  . AMPUTATION Left 01/29/2015   Procedure: Left  AMPUTATION BELOW KNEE;  Surgeon: Charles E Fields, MD;  Location: [facility] OR;  Service: Vascular;  Laterality: Left;  . AMPUTATION Right 06/17/2017   Procedure: Right  BELOW KNEE Amputation;  Surgeon: Fields, Charles E, MD;  Location: [facility] OR;  Service: Vascular;  Laterality: Right;  . BIOPSY  08/25/2019   Procedure: BIOPSY;  Surgeon: Ganem, Salem F, MD;  Location: [facility] ENDOSCOPY;  Service: Endoscopy;;  sigmoid  . FLEXIBLE SIGMOIDOSCOPY N/A 08/25/2019   Procedure: FLEXIBLE SIGMOIDOSCOPY;  Surgeon: Ganem, Salem F, MD;    Location: [facility];  Service: Endoscopy;  Laterality: N/A;  . KNEE SURGERY Right 1984  . LAPAROTOMY N/A 08/30/2019   Procedure: EXPLORATORY LAPAROTOMY WITH SIGMOID RESECTION AND COLOSTOMY PLACEMENT;  Surgeon: Jovita Kussmaul, MD;  Location: [facility] ORS;  Service: General;  Laterality: N/A;  . PERIPHERAL VASCULAR CATHETERIZATION N/A 09/15/2014   Procedure: Abdominal Aortogram;  Surgeon: Elam Dutch, MD;  Location: [facility];   Service: Cardiovascular;  Laterality: N/A;  . PERIPHERAL VASCULAR CATHETERIZATION Bilateral 09/15/2014   Procedure: Lower Extremity Angiography;  Surgeon: Elam Dutch, MD;  Location: [facility];  Service: Cardiovascular;  Laterality: Bilateral;  . SPINE SURGERY    . SUBMUCOSAL INJECTION  08/25/2019   Procedure: SUBMUCOSAL INJECTION;  Surgeon: Wonda Horner, MD;  Location: [facility];  Service: Endoscopy;;  :  Current Facility-Administered Medications  Medication Dose Route Frequency Provider Last Rate Last Admin  . acetaminophen (TYLENOL) tablet 650 mg  650 mg Oral Q6H Jill Alexanders, PA-C   650 mg at 09/01/19 1021  . albuterol (PROVENTIL) (2.5 MG/3ML) 0.083% nebulizer solution 2.5 mg  2.5 mg Nebulization BID Regalado, Belkys A, MD   2.5 mg at 09/01/19 0834  . Chlorhexidine Gluconate Cloth 2 % PADS 6 each  6 each Topical Daily Saverio Danker, PA-C   6 each at 09/01/19 [phone]  . diclofenac Sodium (VOLTAREN) 1 % topical gel 2 g  2 g Topical QID Saverio Danker, PA-C   2 g at 09/01/19 4193  . enoxaparin (LOVENOX) injection 40 mg  40 mg Subcutaneous Q24H Saverio Danker, PA-C   40 mg at 08/31/19 1624  . feeding supplement (BOOST / RESOURCE BREEZE) liquid 1 Container  1 Container Oral TID BM Domenic Polite, MD   1 Container at 09/01/19 [phone]  . guaiFENesin (MUCINEX) 12 hr tablet 600 mg  600 mg Oral BID Regalado, Belkys A, MD   600 mg at 09/01/19 1021  . guaiFENesin-dextromethorphan (ROBITUSSIN DM) 100-10 MG/5ML syrup 5 mL  5 mL Oral Q4H PRN Saverio Danker, PA-C   5 mL at 08/31/19 1107  . insulin aspart (novoLOG) injection 0-15 Units  0-15 Units Subcutaneous Q6H Lenis Noon, [facility]   2 Units at 09/01/19 0547  . lidocaine (LIDODERM) 5 % 1 patch  1 patch Transdermal Q24H Jill Alexanders, PA-C   1 patch at 09/01/19 1021  . naphazoline-glycerin (CLEAR EYES REDNESS) ophth solution 1-2 drop  1-2 drop Both Eyes QID PRN Saverio Danker, PA-C   2 drop at 08/28/19 4097  . nicotine  (NICODERM CQ - dosed in mg/24 hours) patch 14 mg  14 mg Transdermal Daily Saverio Danker, PA-C   14 mg at 09/01/19 1020  . nystatin (MYCOSTATIN) 100000 UNIT/ML suspension 500,000 Units  5 mL Oral QID Regalado, Belkys A, MD      . ondansetron (ZOFRAN) injection 4 mg  4 mg Intravenous Q6H PRN Saverio Danker, PA-C   4 mg at 08/29/19 2205  . pantoprazole (PROTONIX) injection 40 mg  40 mg Intravenous Daily Saverio Danker, PA-C   40 mg at 09/01/19 1021  . sodium chloride flush (NS) 0.9 % injection 10-40 mL  10-40 mL Intracatheter Q12H Saverio Danker, PA-C   10 mL at 08/31/19 2122  . sodium chloride flush (NS) 0.9 % injection 10-40 mL  10-40 mL Intracatheter PRN Saverio Danker, PA-C   10 mL at 09/01/19 0359  . sodium chloride flush (NS) 0.9 % injection 3 mL  3 mL Intravenous Q12H Saverio Danker, PA-C  3 mL at 08/31/19 2122  . sodium phosphate 10 mmol in dextrose 5 % 250 mL infusion  10 mmol Intravenous Once Swayne, Mary M, RPH 42 mL/hr at 09/01/19 1019 10 mmol at 09/01/19 1019  . TPN ADULT (ION)   Intravenous Continuous TPN Swayne, Mary M, RPH 75 mL/hr at 08/31/19 1733 New Bag at 08/31/19 1733  . TPN ADULT (ION)   Intravenous Continuous TPN Swayne, Mary M, RPH         No Known Allergies:  Family History  Problem Relation Age of Onset  . Diabetes Mother   . Heart disease Mother   . Dementia Sister   . Diabetes Son   :  Social History   Socioeconomic History  . Marital status: Widowed    Spouse name: Not on file  . Number of children: Not on file  . Years of education: Not on file  . Highest education level: Not on file  Occupational History  . Not on file  Tobacco Use  . Smoking status: Never Smoker  . Smokeless tobacco: Never Used  Substance and Sexual Activity  . Alcohol use: No    Alcohol/week: 0.0 standard drinks  . Drug use: No  . Sexual activity: Not on file  Other Topics Concern  . Not on file  Social History Narrative  . Not on file   Social Determinants of Health    Financial Resource Strain:   . Difficulty of Paying Living Expenses:   Food Insecurity:   . Worried About Running Out of Food in the Last Year:   . Ran Out of Food in the Last Year:   Transportation Needs:   . Lack of Transportation (Medical):   . Lack of Transportation (Non-Medical):   Physical Activity:   . Days of Exercise per Week:   . Minutes of Exercise per Session:   Stress:   . Feeling of Stress :   Social Connections:   . Frequency of Communication with Friends and Family:   . Frequency of Social Gatherings with Friends and Family:   . Attends Religious Services:   . Active Member of Clubs or Organizations:   . Attends Club or Organization Meetings:   . Marital Status:   Intimate Partner Violence:   . Fear of Current or Ex-Partner:   . Emotionally Abused:   . Physically Abused:   . Sexually Abused:   :  Review of Systems: A comprehensive 14 point review of systems was negative except as noted in the HPI.  Exam: Patient Vitals for the past 24 hrs:  BP Temp Temp src Pulse Resp SpO2 Weight  09/01/19 0834 -- -- -- -- -- 94 % --  09/01/19 0524 104/64 98.5 F (36.9 C) Oral 91 16 96 % --  09/01/19 0500 -- -- -- -- -- -- 62.3 kg  08/31/19 2027 97/63 98 F (36.7 C) Oral 87 20 97 % --  08/31/19 1530 -- -- -- -- -- 95 % --  08/31/19 1404 110/70 98.2 F (36.8 C) Axillary 91 20 98 % --  08/31/19 1336 (!) 91/57 (!) 97.3 F (36.3 C) Oral 90 (!) 24 97 % --  08/31/19 1203 -- -- -- 94 -- -- --    General: Elderly female, no distress Eyes:  no scleral icterus.   ENT: Voice is noted to be hoarse, there were no oropharyngeal lesions.   Neck was without thyromegaly.   Lymphatics:  Negative cervical, supraclavicular or axillary adenopathy.   Respiratory: lungs were clear   bilaterally without wheezing or crackles.   Cardiovascular:  Regular rate and rhythm, S1/S2, without murmur, rub or gallop.  There was no pedal edema.   GI: Soft, mild tenderness with palpation, dressing  clean dry and intact, left lower quadrant stoma pink with a small amount of liquid stool noted Musculoskeletal: Bilateral BKA.   Skin: No petechiae, multiple ecchymoses over her upper arms.   Neuro exam was nonfocal. Patient was alert and oriented.  Attention was good.   Language was appropriate.  Mood was normal without depression.  Speech was not pressured.  Thought content was not tangential.     Lab Results  Component Value Date   WBC 10.7 (H) 08/31/2019   HGB 11.6 (L) 08/31/2019   HCT 35.5 (L) 08/31/2019   PLT 279 08/31/2019   GLUCOSE 128 (H) 09/01/2019   CHOL 124 09/30/2017   TRIG 43 08/29/2019   HDL 47.00 09/30/2017   LDLCALC 64 09/30/2017   ALT 13 09/01/2019   AST 20 09/01/2019   NA 132 (L) 09/01/2019   K 4.9 09/01/2019   CL 103 09/01/2019   CREATININE <0.30 (L) 09/01/2019   BUN 17 09/01/2019   CO2 22 09/01/2019    CT HEAD WO CONTRAST  Result Date: 08/28/2019 CLINICAL DATA:  Seizure. Abnormal neuro exam. Falls. EXAM: CT HEAD WITHOUT CONTRAST TECHNIQUE: Contiguous axial images were obtained from the base of the skull through the vertex without intravenous contrast. COMPARISON:  None. FINDINGS: Brain: Mild generalized atrophy and moderate diffuse white matter disease is present bilaterally. Basal ganglia are intact. Insular ribbon is normal bilaterally. No acute hemorrhage or mass lesion is present. The ventricles are proportionate to the degree of atrophy. No significant extraaxial fluid collection is present. The brainstem and cerebellum are within normal limits. Vascular: Atherosclerotic changes are present within the cavernous internal carotid arteries bilaterally as well as at the dural margin of the left vertebral artery. No hyperdense vessel is present. Skull: Calvarium is intact. No focal lytic or blastic lesions are present. Sinuses/Orbits: The paranasal sinuses and mastoid air cells are clear. Bilateral lens replacements are noted. Globes and orbits are otherwise  unremarkable. IMPRESSION: 1. Mild generalized atrophy and moderate diffuse white matter disease. This likely reflects the sequela of chronic microvascular ischemia. 2. No acute intracranial abnormality or significant interval change. Electronically Signed   By: Christopher  Mattern M.D.   On: 08/28/2019 15:17   CT Abdomen Pelvis W Contrast  Result Date: 08/23/2019 CLINICAL DATA:  Abdominal distension and constipation. Intermittent vomiting. Suspected bowel obstruction. EXAM: CT ABDOMEN AND PELVIS WITH CONTRAST TECHNIQUE: Multidetector CT imaging of the abdomen and pelvis was performed using the standard protocol following bolus administration of intravenous contrast. CONTRAST:  100mL OMNIPAQUE IOHEXOL 300 MG/ML  SOLN COMPARISON:  None. FINDINGS: Lower chest: Small dependent left pleural effusion with mild dependent left lower lobe atelectasis. The right lung base is clear. Atherosclerosis of the aorta and coronary arteries. There is a small hiatal hernia with mild distal esophageal wall thickening. Hepatobiliary: There is a large peripherally enhancing mass involving the right hepatic lobe which measures 10.0 x 7.2 x 11.3 cm. There are additional smaller lesions in the right lobe measuring 14 mm on image 15/2 and 14 mm on image 32/2. No definite lesions in the left lobe. There are no underlying morphologic changes of cirrhosis. No evidence of gallstones, gallbladder wall thickening or biliary dilatation. Pancreas: Unremarkable. No pancreatic ductal dilatation or surrounding inflammatory changes. Spleen: Normal in size without focal abnormality. Adrenals/Urinary Tract: Both   adrenal glands appear normal. The kidneys appear normal without evidence of urinary tract calculus, suspicious lesion or hydronephrosis. No bladder abnormalities are seen. Stomach/Bowel: The stomach and proximal small bowel are decompressed. There are several mildly dilated and fluid-filled loops of mid small-bowel with long segment small  bowel wall thickening and hyperenhancement in the right lower quadrant. The terminal ileum is decompressed. There is at least moderate diffuse distention of the colon with associated air-fluid levels. There is fairly abrupt change in caliber in the proximal sigmoid colon with suspicion of an infiltrating sigmoid colon mass (axial images 60 through 62 of series 2), suspicious for colon cancer. The rectosigmoid colon is decompressed. Vascular/Lymphatic: There are no enlarged abdominal or pelvic lymph nodes. Diffuse aortic and branch vessel atherosclerosis. No evidence of large vessel occlusion. The portal, superior mesenteric and splenic veins are patent. Reproductive: Hysterectomy. No adnexal mass. Other: Small amount of pelvic ascites. No definite peritoneal nodularity. No evidence of abdominal wall hernia. Musculoskeletal: No acute or significant osseous findings. There are degenerative changes throughout the lumbar spine associated with a convex left scoliosis. There is sclerosis of both femoral heads, not typical for AVN. No evidence of osseous metastatic disease. IMPRESSION: 1. Suspected infiltrating sigmoid colon mass, highly suspicious for colon cancer, with resulting distal colonic obstruction. 2. Large peripherally enhancing mass involving the right hepatic lobe with additional smaller lesions in the right lobe, most consistent with metastatic colon cancer. There are no morphologic changes of cirrhosis to suggest hepatocellular carcinoma. 3. Long segment wall thickening and hyperenhancement in the distal small bowel, without focal mass lesion, possibly inflammatory. There is resulting mild proximal upstream small-bowel dilatation. 4. Small dependent left pleural effusion with mild dependent left lower lobe atelectasis. Small amount of ascites without definite peritoneal nodularity. 5. Aortic Atherosclerosis (ICD10-I70.0). Electronically Signed   By: William  Veazey M.D.   On: 08/23/2019 16:45   DG CHEST  PORT 1 VIEW  Result Date: 08/31/2019 CLINICAL DATA:  Cough. New diagnosis of adenocarcinoma of the sigmoid colon. EXAM: PORTABLE CHEST 1 VIEW COMPARISON:  None. FINDINGS: Right-sided PICC line is difficult to follow centrally, likely terminating at the superior caval/atrial junction. Patient rotated left. Mild cardiomegaly. Atherosclerosis in the transverse aorta. No pleural effusion or pneumothorax. Biapical pleural thickening. Clear lungs. IMPRESSION: No acute cardiopulmonary disease. Aortic Atherosclerosis (ICD10-I70.0). Electronically Signed   By: Kyle  Talbot M.D.   On: 08/31/2019 14:09   DG INTRO LONG GI TUBE  Result Date: 08/25/2019 CLINICAL DATA:  Abdominal distension, nutritional needs EXAM: FL FEEDING TUBE PLACEMENT FLUOROSCOPY TIME:  Fluoroscopy Time:  1 minutes, 12 seconds Radiation Exposure Index (if provided by the fluoroscopic device): 7.9 mGy Number of Acquired Spot Images: 0 COMPARISON:  None. FINDINGS: Local anesthetic was applied to the right nostril. A nasogastric tube was advanced without difficulty from the right nostril into the stomach under fluoroscopic observation. The tube was taped in place using a dedicated nasal tape apparatus with clamp. The nasogastric tube is ready to use. IMPRESSION: 1. Successful fluoroscopically guided nasogastric tube placement. Electronically Signed   By: Walter  Liebkemann M.D.   On: 08/25/2019 08:32   ECHOCARDIOGRAM COMPLETE  Result Date: 08/24/2019    ECHOCARDIOGRAM REPORT   Patient Name:   Jontae K Wholey Date of Exam: 08/24/2019 Medical Rec #:  [id number]          Height:       67.0 in Accession #:    [id number]         Weight:         130.0 lb Date of Birth:  09/26/1931          BSA:          1.684 m Patient Age:    87 years           BP:           127/81 mmHg Patient Gender: F                  HR:           90 bpm. Exam Location:  Inpatient Procedure: 2D Echo Indications:    Murmur 785.2 / R01.1  History:        Patient has no prior history of  Echocardiogram examinations.                 PAD, Arrythmias:LBBB; Risk Factors:Hypertension. Double BKA,                 uterine cancer.  Sonographer:    Tiffany Cooper RDCS Referring Phys: [id number] CADENCE H FURTH IMPRESSIONS  1. Left ventricular ejection fraction, by estimation, is 60 to 65%. The left ventricle has normal function. The left ventricle has no regional wall motion abnormalities. Left ventricular diastolic parameters are consistent with Grade I diastolic dysfunction (impaired relaxation).  2. Right ventricular systolic function is normal. The right ventricular size is normal.  3. The mitral valve is normal in structure. No evidence of mitral valve regurgitation. No evidence of mitral stenosis.  4. The aortic valve is normal in structure. Aortic valve regurgitation is mild. No aortic stenosis is present. FINDINGS  Left Ventricle: Left ventricular ejection fraction, by estimation, is 60 to 65%. The left ventricle has normal function. The left ventricle has no regional wall motion abnormalities. The left ventricular internal cavity size was normal in size. There is  no left ventricular hypertrophy. Left ventricular diastolic parameters are consistent with Grade I diastolic dysfunction (impaired relaxation). Right Ventricle: The right ventricular size is normal. No increase in right ventricular wall thickness. Right ventricular systolic function is normal. Left Atrium: Left atrial size was normal in size. Right Atrium: Right atrial size was normal in size. Pericardium: There is no evidence of pericardial effusion. Mitral Valve: The mitral valve is normal in structure. No evidence of mitral valve regurgitation. No evidence of mitral valve stenosis. Tricuspid Valve: The tricuspid valve is normal in structure. Tricuspid valve regurgitation is trivial. Aortic Valve: The aortic valve is normal in structure. Aortic valve regurgitation is mild. Aortic regurgitation PHT measures 510 msec. No aortic stenosis is  present. Mild aortic valve annular calcification. Pulmonic Valve: The pulmonic valve was normal in structure. Pulmonic valve regurgitation is not visualized. No evidence of pulmonic stenosis. Aorta: The aortic root and ascending aorta are structurally normal, with no evidence of dilitation. IAS/Shunts: The atrial septum is grossly normal.  LEFT VENTRICLE PLAX 2D LVIDd:         4.00 cm     Diastology LVIDs:         3.10 cm     LV e' lateral:   6.42 cm/s LV PW:         1.10 cm     LV E/e' lateral: 9.7 LV IVS:        1.40 cm     LV e' medial:    6.74 cm/s LVOT diam:     1.80 cm     LV E/e' medial:  9.2 LV SV:         74   LV SV Index:   44 LVOT Area:     2.54 cm  LV Volumes (MOD) LV vol d, MOD A2C: 61.5 ml LV vol d, MOD A4C: 81.7 ml LV vol s, MOD A2C: 44.1 ml LV vol s, MOD A4C: 50.0 ml LV SV MOD A2C:     17.4 ml LV SV MOD A4C:     81.7 ml LV SV MOD BP:      22.9 ml RIGHT VENTRICLE RV S prime:     15.90 cm/s TAPSE (M-mode): 1.3 cm LEFT ATRIUM             Index       RIGHT ATRIUM           Index LA diam:        3.60 cm 2.14 cm/m  RA Area:     11.70 cm LA Vol (A2C):   42.6 ml 25.30 ml/m RA Volume:   24.50 ml  14.55 ml/m LA Vol (A4C):   33.9 ml 20.13 ml/m LA Biplane Vol: 39.7 ml 23.58 ml/m  AORTIC VALVE LVOT Vmax:   197.00 cm/s LVOT Vmean:  111.000 cm/s LVOT VTI:    0.290 m AI PHT:      510 msec  AORTA Ao Root diam: 3.00 cm Ao Asc diam:  3.50 cm MITRAL VALVE                TRICUSPID VALVE MV Area (PHT): 2.82 cm     TR Peak grad:   26.0 mmHg MV Decel Time: 269 msec     TR Vmax:        255.00 cm/s MV E velocity: 62.10 cm/s MV A velocity: 113.00 cm/s  SHUNTS MV E/A ratio:  0.55         Systemic VTI:  0.29 m                             Systemic Diam: 1.80 cm Mertie Moores MD Electronically signed by Mertie Moores MD Signature Date/Time: 08/24/2019/3:26:02 PM    Final    [place] EKG SITE RITE  Result Date: 08/26/2019 If Site Rite image not attached, placement could not be confirmed due to current cardiac rhythm.    CT  HEAD WO CONTRAST  Result Date: 08/28/2019 CLINICAL DATA:  Seizure. Abnormal neuro exam. Falls. EXAM: CT HEAD WITHOUT CONTRAST TECHNIQUE: Contiguous axial images were obtained from the base of the skull through the vertex without intravenous contrast. COMPARISON:  None. FINDINGS: Brain: Mild generalized atrophy and moderate diffuse white matter disease is present bilaterally. Basal ganglia are intact. Insular ribbon is normal bilaterally. No acute hemorrhage or mass lesion is present. The ventricles are proportionate to the degree of atrophy. No significant extraaxial fluid collection is present. The brainstem and cerebellum are within normal limits. Vascular: Atherosclerotic changes are present within the cavernous internal carotid arteries bilaterally as well as at the dural margin of the left vertebral artery. No hyperdense vessel is present. Skull: Calvarium is intact. No focal lytic or blastic lesions are present. Sinuses/Orbits: The paranasal sinuses and mastoid air cells are clear. Bilateral lens replacements are noted. Globes and orbits are otherwise unremarkable. IMPRESSION: 1. Mild generalized atrophy and moderate diffuse white matter disease. This likely reflects the sequela of chronic microvascular ischemia. 2. No acute intracranial abnormality or significant interval change. Electronically Signed   By: San Morelle M.D.   On: 08/28/2019 15:17   CT Abdomen Pelvis W Contrast  Result Date: 08/23/2019 CLINICAL DATA:  Abdominal distension and constipation. Intermittent vomiting. Suspected bowel obstruction. EXAM: CT ABDOMEN AND PELVIS WITH CONTRAST TECHNIQUE: Multidetector CT imaging of the abdomen and pelvis was performed using the standard protocol following bolus administration of intravenous contrast. CONTRAST:  100mL OMNIPAQUE IOHEXOL 300 MG/ML  SOLN COMPARISON:  None. FINDINGS: Lower chest: Small dependent left pleural effusion with mild dependent left lower lobe atelectasis. The right  lung base is clear. Atherosclerosis of the aorta and coronary arteries. There is a small hiatal hernia with mild distal esophageal wall thickening. Hepatobiliary: There is a large peripherally enhancing mass involving the right hepatic lobe which measures 10.0 x 7.2 x 11.3 cm. There are additional smaller lesions in the right lobe measuring 14 mm on image 15/2 and 14 mm on image 32/2. No definite lesions in the left lobe. There are no underlying morphologic changes of cirrhosis. No evidence of gallstones, gallbladder wall thickening or biliary dilatation. Pancreas: Unremarkable. No pancreatic ductal dilatation or surrounding inflammatory changes. Spleen: Normal in size without focal abnormality. Adrenals/Urinary Tract: Both adrenal glands appear normal. The kidneys appear normal without evidence of urinary tract calculus, suspicious lesion or hydronephrosis. No bladder abnormalities are seen. Stomach/Bowel: The stomach and proximal small bowel are decompressed. There are several mildly dilated and fluid-filled loops of mid small-bowel with long segment small bowel wall thickening and hyperenhancement in the right lower quadrant. The terminal ileum is decompressed. There is at least moderate diffuse distention of the colon with associated air-fluid levels. There is fairly abrupt change in caliber in the proximal sigmoid colon with suspicion of an infiltrating sigmoid colon mass (axial images 60 through 62 of series 2), suspicious for colon cancer. The rectosigmoid colon is decompressed. Vascular/Lymphatic: There are no enlarged abdominal or pelvic lymph nodes. Diffuse aortic and branch vessel atherosclerosis. No evidence of large vessel occlusion. The portal, superior mesenteric and splenic veins are patent. Reproductive: Hysterectomy. No adnexal mass. Other: Small amount of pelvic ascites. No definite peritoneal nodularity. No evidence of abdominal wall hernia. Musculoskeletal: No acute or significant osseous  findings. There are degenerative changes throughout the lumbar spine associated with a convex left scoliosis. There is sclerosis of both femoral heads, not typical for AVN. No evidence of osseous metastatic disease. IMPRESSION: 1. Suspected infiltrating sigmoid colon mass, highly suspicious for colon cancer, with resulting distal colonic obstruction. 2. Large peripherally enhancing mass involving the right hepatic lobe with additional smaller lesions in the right lobe, most consistent with metastatic colon cancer. There are no morphologic changes of cirrhosis to suggest hepatocellular carcinoma. 3. Long segment wall thickening and hyperenhancement in the distal small bowel, without focal mass lesion, possibly inflammatory. There is resulting mild proximal upstream small-bowel dilatation. 4. Small dependent left pleural effusion with mild dependent left lower lobe atelectasis. Small amount of ascites without definite peritoneal nodularity. 5. Aortic Atherosclerosis (ICD10-I70.0). Electronically Signed   By: William  Veazey M.D.   On: 08/23/2019 16:45   DG CHEST PORT 1 VIEW  Result Date: 08/31/2019 CLINICAL DATA:  Cough. New diagnosis of adenocarcinoma of the sigmoid colon. EXAM: PORTABLE CHEST 1 VIEW COMPARISON:  None. FINDINGS: Right-sided PICC line is difficult to follow centrally, likely terminating at the superior caval/atrial junction. Patient rotated left. Mild cardiomegaly. Atherosclerosis in the transverse aorta. No pleural effusion or pneumothorax. Biapical pleural thickening. Clear lungs. IMPRESSION: No acute cardiopulmonary disease. Aortic Atherosclerosis (ICD10-I70.0). Electronically Signed   By: Kyle  Talbot M.D.   On: 08/31/2019 14:09   DG INTRO LONG GI TUBE    Result Date: 08/25/2019 CLINICAL DATA:  Abdominal distension, nutritional needs EXAM: FL FEEDING TUBE PLACEMENT FLUOROSCOPY TIME:  Fluoroscopy Time:  1 minutes, 12 seconds Radiation Exposure Index (if provided by the fluoroscopic device):  7.9 mGy Number of Acquired Spot Images: 0 COMPARISON:  None. FINDINGS: Local anesthetic was applied to the right nostril. A nasogastric tube was advanced without difficulty from the right nostril into the stomach under fluoroscopic observation. The tube was taped in place using a dedicated nasal tape apparatus with clamp. The nasogastric tube is ready to use. IMPRESSION: 1. Successful fluoroscopically guided nasogastric tube placement. Electronically Signed   By: Walter  Liebkemann M.D.   On: 08/25/2019 08:32   ECHOCARDIOGRAM COMPLETE  Result Date: 08/24/2019    ECHOCARDIOGRAM REPORT   Patient Name:   Marlenne K Sorce Date of Exam: 08/24/2019 Medical Rec #:  [id number]          Height:       67.0 in Accession #:    [id number]         Weight:       130.0 lb Date of Birth:  03/05/1932          BSA:          1.684 m Patient Age:    87 years           BP:           127/81 mmHg Patient Gender: F                  HR:           90 bpm. Exam Location:  Inpatient Procedure: 2D Echo Indications:    Murmur 785.2 / R01.1  History:        Patient has no prior history of Echocardiogram examinations.                 PAD, Arrythmias:LBBB; Risk Factors:Hypertension. Double BKA,                 uterine cancer.  Sonographer:    Tiffany Cooper RDCS Referring Phys: [id number] CADENCE H FURTH IMPRESSIONS  1. Left ventricular ejection fraction, by estimation, is 60 to 65%. The left ventricle has normal function. The left ventricle has no regional wall motion abnormalities. Left ventricular diastolic parameters are consistent with Grade I diastolic dysfunction (impaired relaxation).  2. Right ventricular systolic function is normal. The right ventricular size is normal.  3. The mitral valve is normal in structure. No evidence of mitral valve regurgitation. No evidence of mitral stenosis.  4. The aortic valve is normal in structure. Aortic valve regurgitation is mild. No aortic stenosis is present. FINDINGS  Left Ventricle: Left  ventricular ejection fraction, by estimation, is 60 to 65%. The left ventricle has normal function. The left ventricle has no regional wall motion abnormalities. The left ventricular internal cavity size was normal in size. There is  no left ventricular hypertrophy. Left ventricular diastolic parameters are consistent with Grade I diastolic dysfunction (impaired relaxation). Right Ventricle: The right ventricular size is normal. No increase in right ventricular wall thickness. Right ventricular systolic function is normal. Left Atrium: Left atrial size was normal in size. Right Atrium: Right atrial size was normal in size. Pericardium: There is no evidence of pericardial effusion. Mitral Valve: The mitral valve is normal in structure. No evidence of mitral valve regurgitation. No evidence of mitral valve stenosis. Tricuspid Valve: The tricuspid valve is normal in structure. Tricuspid valve regurgitation is trivial. Aortic Valve:   The aortic valve is normal in structure. Aortic valve regurgitation is mild. Aortic regurgitation PHT measures 510 msec. No aortic stenosis is present. Mild aortic valve annular calcification. Pulmonic Valve: The pulmonic valve was normal in structure. Pulmonic valve regurgitation is not visualized. No evidence of pulmonic stenosis. Aorta: The aortic root and ascending aorta are structurally normal, with no evidence of dilitation. IAS/Shunts: The atrial septum is grossly normal.  LEFT VENTRICLE PLAX 2D LVIDd:         4.00 cm     Diastology LVIDs:         3.10 cm     LV e' lateral:   6.42 cm/s LV PW:         1.10 cm     LV E/e' lateral: 9.7 LV IVS:        1.40 cm     LV e' medial:    6.74 cm/s LVOT diam:     1.80 cm     LV E/e' medial:  9.2 LV SV:         74 LV SV Index:   44 LVOT Area:     2.54 cm  LV Volumes (MOD) LV vol d, MOD A2C: 61.5 ml LV vol d, MOD A4C: 81.7 ml LV vol s, MOD A2C: 44.1 ml LV vol s, MOD A4C: 50.0 ml LV SV MOD A2C:     17.4 ml LV SV MOD A4C:     81.7 ml LV SV MOD BP:       22.9 ml RIGHT VENTRICLE RV S prime:     15.90 cm/s TAPSE (M-mode): 1.3 cm LEFT ATRIUM             Index       RIGHT ATRIUM           Index LA diam:        3.60 cm 2.14 cm/m  RA Area:     11.70 cm LA Vol (A2C):   42.6 ml 25.30 ml/m RA Volume:   24.50 ml  14.55 ml/m LA Vol (A4C):   33.9 ml 20.13 ml/m LA Biplane Vol: 39.7 ml 23.58 ml/m  AORTIC VALVE LVOT Vmax:   197.00 cm/s LVOT Vmean:  111.000 cm/s LVOT VTI:    0.290 m AI PHT:      510 msec  AORTA Ao Root diam: 3.00 cm Ao Asc diam:  3.50 cm MITRAL VALVE                TRICUSPID VALVE MV Area (PHT): 2.82 cm     TR Peak grad:   26.0 mmHg MV Decel Time: 269 msec     TR Vmax:        255.00 cm/s MV E velocity: 62.10 cm/s MV A velocity: 113.00 cm/s  SHUNTS MV E/A ratio:  0.55         Systemic VTI:  0.29 m                             Systemic Diam: 1.80 cm Mertie Moores MD Electronically signed by Mertie Moores MD Signature Date/Time: 08/24/2019/3:26:02 PM    Final    [place] EKG SITE RITE  Result Date: 08/26/2019 If Site Rite image not attached, placement could not be confirmed due to current cardiac rhythm.   Pathology:  SURGICAL PATHOLOGY  * THIS IS AN ADDENDUM REPORT *  CASE: [id number]  PATIENT: [name]  Surgical Pathology Report  *Addendum *  Reason for Addendum #1: Molecular studies   Clinical History: Sigmoid mass (crm)   FINAL MICROSCOPIC DIAGNOSIS:   A. COLON, SIGMOID, BIOPSY:  - Adenocarcinoma.   COMMENT:   IHC for MMR will be reported separately.   Results reported to Dr. Salem Ganem on 08/26/2019. Intradepartmental  consultation (Dr. Butler).   GROSS DESCRIPTION:   Received in formalin are tan, soft tissue fragments that are submitted  in toto.  Number: 3 Size: 0.3-0.4 cm Blocks: 1 (GRP 08/25/2019)    Final Diagnosis performed by Anastasia Canacci, MD.  Electronically  signed 08/26/2019  Technical component performed at [facility], [address] [place], [place] [address].   Professional component performed at [facility],  [address], [place], [place] [address].  Immunohistochemistry Technical component (if applicable) was performed  at [facility]. [address],  [place], [place] [address].  IMMUNOHISTOCHEMISTRY DISCLAIMER (if applicable):  Some of these immunohistochemical stains may have been developed and the  performance characteristics determine by [facility]. Some  may not have been cleared or approved by the U.S. Food and Drug  Administration. The FDA has determined that such clearance or approval  is not necessary. This test is used for clinical purposes. It should not  be regarded as investigational or for research. This laboratory is  certified under the Clinical Laboratory Improvement Amendments of 1988  (CLIA-88) as qualified to perform high complexity clinical laboratory  testing. The controls stained appropriately.   ADDENDUM:  There is cancer cancer primary care movement in the bony organ Mismatch Repair Protein (IHC)  SUMMARY INTERPRETATION: NORMAL  There is preserved expression of the major MMR proteins. There is a very  low probability that microsatellite instability (MSI) is present.  However, certain clinically significant MMR protein mutations may result  in preservation of nuclear expression. It is recommended that the  preservation of protein expression be correlated with molecular based  MSI testing.   IHC EXPRESSION RESULTS  TEST      RESULT  MLH1:     Preserved nuclear expression  MSH2:     Preserved nuclear expression  MSH6:     Preserved nuclear expression  PMS2:     Preserved nuclear expression   Assessment and Plan:   1.  Adenocarcinoma of the sigmoid colon 08/23/2019 CT of the abdomen pelvis with contrast - "1. Suspected infiltrating sigmoid colon mass, highly suspicious for colon cancer, with resulting distal colonic  obstruction. 2. Large peripherally enhancing mass involving the right hepatic lobe with additional smaller lesions in the right lobe, most consistent with metastatic colon cancer. There are no morphologic changes of cirrhosis to suggest hepatocellular carcinoma. 3. Long segment wall thickening and hyperenhancement in the distal small bowel, without focal mass lesion, possibly inflammatory. There is resulting mild proximal upstream small-bowel dilatation." -08/24/2019 CEA 89.4 -08/25/2019 pathology from sigmoid colon consistent with adenocarcinoma, mismatch repair protein normal  2.  Liver mass - ?  Metastasis from colon versus second primary  3.  Anemia secondary to underlying malignancy and GI blood loss  4.  Peripheral artery disease  5.  Hypertension  PLAN:  -CT findings and pathology results had been discussed with the patient.  We discussed that the mass in the liver could be metastatic disease from her colon or a second primary. -We will obtain a CA 19.9 and AFP. -Recommend CT of the chest with contrast to complete her staging work-up. -Await final report of surgical   pathology. -Once her staging work-up is complete, we can have a more detailed discussion about her treatment options.  Outpatient follow-up can be arranged for additional discussion of treatment options.  Thank you for this referral.   Mikey Bussing, DNP, AGPCNP-BC, AOCNP   ADDENDUM  .Patient was Personally and independently interviewed, examined and relevant elements of the history of present illness were reviewed in details and an assessment and plan was created. All elements of the patient's history of present illness , assessment and plan were discussed in details with Mikey Bussing, DNP, AGPCNP-BC, AOCNP. The above documentation reflects our combined findings assessment and plan.  86 year old very sweet lady with multiple medical issues including hypertension, peripheral artery disease status post bilateral  BKA, hypertension admitted with  #1 Newly diagnosed metastatic sigmoid Adenocarcinoma with concerns of bowel obstruction. MSI stable Status post sigmoid colectomy with colostomy by Dr. Marlou Starks 08/30/2019. CT abdomen and pelvis done on 08/23/2019 concerning for large metastatic lesion in the liver with additional smaller lesions as well. Elevated CEA level 89.4 consistent with metastatic disease  #2 ECOG performance status of 2-3 Plan -Currently the patient's primary priority is to recover from surgery.  She is starting to eat some liquids and soft solids and notes that she has been passing gas.  Some surgical discomfort but no other acute new symptoms. -We discussed her pathology result from the sigmoidoscopy and her CT results. -Her pathology from the surgery is pending at this time. -CA 19-9 and AFP tumor markers to rule out the possibility of other primary liver/biliary tumors. -CT chest when possible to complete staging work-up. -Patient has been living by herself but her grand son lives close by and helps her with most decisions and she would like to have him involved in her medical cares and decision-making. -We discussed that treatment will likely be palliative and she understands this and is hesitant to consider any palliative chemotherapy options. -I called and talked to her grandson Mr. [name] in details and shared with him all the available information.  We discussed the stage diagnosis of her colon cancer and the presence of metastatic disease and a Birdseye view of treatment intention and planning.  -We shall set her up for outpatient follow-up with me in the cancer clinic in 2 to 3 weeks to make a final decision regarding treatment after healing from surgery- ?  Consideration of palliative chemotherapy versus best supportive cares. -We will likely need PT and OT evaluation prior to discharge to determine her discharge needs.  Patient lives with her grandson living next door and has  been living independently with help from her grandson.  Sullivan Lone MD MS

## 2019-09-01 NOTE — Evaluation (Signed)
Occupational Therapy Evaluation Patient Details Name: [name] MRN: [id number] DOB: 03-06-1932 Today's Date: 09/01/2019    History of Present Illness 86 yo female admitted with large bowel obstruction. S/P ex lap colectomy, colostomy 4/20. Imaging (+) sigmoid mass. Hx of bil BKA, uterine ca, osteoporosis   Clinical Impression   Patient with functional deficits listed below impacting safety and independence with self care. Patient require mod A for bed mobility, min guard to min/mod A with sitting balance with x2 posterior loss of balance due to weakness, decreased activity tolerance. With max A patient unable to problem solve donning prosthetics seated edge of bed. Mentions that she has other socks that are thinner and stated she wore them into the hospital however OT unable to find them in patient's room. Patient states she will call her grandson to check on them. Will continue to follow.    Follow Up Recommendations  SNF    Equipment Recommendations  Other (comment)(defer to next venue)       Precautions / Restrictions Precautions Precautions: Fall Precaution Comments: bil BKA-has prostheses in room Restrictions Weight Bearing Restrictions: No      Mobility Bed Mobility Overal bed mobility: Needs Assistance Bed Mobility: Supine to Sit;Sit to Supine     Supine to sit: Mod assist;HOB elevated Sit to supine: Mod assist;HOB elevated   General bed mobility comments: cues for sequencing  Transfers                 General transfer comment: deferred     Balance Overall balance assessment: Needs assistance Sitting-balance support: Bilateral upper extremity supported Sitting balance-Leahy Scale: Poor Sitting balance - Comments: posterior loss of balance x2, requires min guard up to min/mod A for sitting balance at edge of bed                                   ADL either performed or assessed with clinical judgement   ADL Overall ADL's : Needs  assistance/impaired     Grooming: Minimal assistance;Sitting Grooming Details (indicate cue type and reason): for balance Upper Body Bathing: Minimal assistance;Sitting   Lower Body Bathing: Set up;Bed level Lower Body Bathing Details (indicate cue type and reason): patient able to wash peri area and upper thighs in bed  Upper Body Dressing : Minimal assistance;Sitting   Lower Body Dressing: Maximal assistance;Sitting/lateral leans Lower Body Dressing Details (indicate cue type and reason): difficulty problem solving donning prosthetics, even with assist from OT patient unable to don correcting, pt losing balance without min guard to min A  Toilet Transfer: +2 for physical assistance;+2 for safety/equipment Toilet Transfer Details (indicate cue type and reason): deferred this session, anticipate x2 assist due to decreased sitting balance and activity tolerance Toileting- Clothing Manipulation and Hygiene: Minimal assistance;Bed level Toileting - Clothing Manipulation Details (indicate cue type and reason): patient able to perform peri area laying in bed      Functional mobility during ADLs: Moderate assistance General ADL Comments: patient requires increased assistance with self care due to decreased activity tolerance, balance, safety awareness, strength                  Pertinent Vitals/Pain Pain Assessment: Faces Faces Pain Scale: Hurts little more Pain Location: abdomen, throat (hoarseness) Pain Descriptors / Indicators: Discomfort;Sore Pain Intervention(s): Monitored during session     Hand Dominance Right   Extremity/Trunk Assessment Upper Extremity Assessment Upper Extremity Assessment: Generalized weakness  Lower Extremity Assessment Lower Extremity Assessment: Defer to PT evaluation   Cervical / Trunk Assessment Cervical / Trunk Assessment: Kyphotic   Communication Communication Communication: No difficulties   Cognition Arousal/Alertness:  Awake/alert Behavior During Therapy: WFL for tasks assessed/performed Overall Cognitive Status: No family/caregiver present to determine baseline cognitive functioning                                 General Comments: difficulty problem solving donning prostheses, some mild difficulty remembering details              Home Living Family/patient expects to be discharged to:: Private residence Living Arrangements: Other (Comment)(grandson lives behind her) Available Help at Discharge: Family Type of Home: House Home Access: Ramped entrance     Home Layout: Able to live on main level with bedroom/bathroom     Bathroom Shower/Tub: Tub/shower unit   Bathroom Toilet: Handicapped height     Home Equipment: Environmental consultant - 2 wheels;Wheelchair - manual;Hospital bed;Transport chair;Bedside commode;Tub bench          Prior Functioning/Environment Level of Independence: Needs assistance  Gait / Transfers Assistance Needed: uses WC vs walking with prostheses (has been a while but within the last month). Pt stated she wears prostheses for transfers to/from [facility] ADL's / Homemaking Assistance Needed: bathes/dresses with mod ind. grandson assists with IADLs grocery shopping, cooking, laundry            OT Problem List: Decreased activity tolerance;Decreased strength;Impaired balance (sitting and/or standing);Decreased safety awareness;Pain      OT Treatment/Interventions: Self-care/ADL training;Therapeutic exercise;Energy conservation;DME and/or AE instruction;Therapeutic activities;Patient/family education;Balance training    OT Goals(Current goals can be found in the care plan section) Acute Rehab OT Goals Patient Stated Goal: to regain function and hopefully return home OT Goal Formulation: With patient Time For Goal Achievement: 09/15/19 Potential to Achieve Goals: Good  OT Frequency: Min 2X/week    AM-PAC OT "6 Clicks" Daily Activity     Outcome Measure Help from  another person eating meals?: None Help from another person taking care of personal grooming?: A Little Help from another person toileting, which includes using toliet, bedpan, or urinal?: Total Help from another person bathing (including washing, rinsing, drying)?: A Lot Help from another person to put on and taking off regular upper body clothing?: A Little Help from another person to put on and taking off regular lower body clothing?: A Lot 6 Click Score: 15   End of Session Equipment Utilized During Treatment: Oxygen  Activity Tolerance: Patient limited by fatigue;Patient tolerated treatment well Patient left: in bed;with call bell/phone within reach;with bed alarm set  OT Visit Diagnosis: Other abnormalities of gait and mobility (R26.89);Muscle weakness (generalized) (M62.81)                Time: [id number] OT Time Calculation (min): 32 min Charges:  OT General Charges $OT Visit: 1 Visit OT Evaluation $OT Eval Moderate Complexity: 1 Mod OT Treatments $Self Care/Home Management : 8-22 mins  Delbert Phenix OT Pager: [phone]  Rosemary Holms 09/01/2019, 12:47 PM

## 2019-09-01 NOTE — Consult Note (Signed)
[employer] Nurse ostomy follow up Stoma type/location: LLQ, end colostomy Stomal assessment/size: 1" x 1 1/2" round, pink, visualized through pouch Peristomal assessment: NA; intact pouch placed yesterday 4/21 Treatment options for stomal/peristomal skin: NA Output scant; serosanguinous  Ostomy pouching: 1pc.convex Education provided:  Patient is awake and alert but not truly oriented to educational needs at this time.  Will reach out to her son [name] to arrange teaching visit with him as he will be primary CG at the time of DC.  Enrolled patient in [place] Start Discharge program: No; will bring form to teaching session with son.   [facility] Nurse will follow along with you for continued support with ostomy teaching and care [place] MSN, RN, [place], [place], [place]

## 2019-09-01 NOTE — Progress Notes (Signed)
PHARMACY - TOTAL PARENTERAL NUTRITION CONSULT NOTE   Indication: sigmoid colon mass with large bowel obstruction  Patient Measurements: Height: 4' 2.25" (127.6 cm) Weight: 62.3 kg (137 lb 5.6 oz) IBW/kg (Calculated) : 23.08 TPN AdjBW (KG): 32 Body mass index is 38.24 kg/m.  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 Pharmacy consulted for TPN.  Glucose / Insulin: No hx of diabetes. CBG goal <150. CBG 121-179, improving. 6 units SSI/24 hrs.  Dexamethasone 5 mg IV given on 4/20.  Electrolytes: Na (132), Mg (1.9), Phos (2.7) - slightly low. K (4.9) on upper end of normal. CorrCa (9.9) WNL. (Goal K+ =/> 4, Mag =/> 2). Renal: SCr low, baseline. Stable. BUN WNL LFTs / TGs: LFTs WNL, Triglycerides 43 (4/19) Prealbumin / albumin: Low: 5.4 (4/16) >> 5.8 (4/19)/ 2 Intake / Output; MIVF:  Unmeasured UOP x1. LR stopped 4/18. GI Imaging: 4/13 CT abdomen: infiltrating sigmoid colon mass suspicious for colon CA resulting in obstruction.  Surgeries / Procedures:  4/15: flex sig 4/20: exploratory laparotomy, sigmoid colectomy with descending colostomy  Central access: PICC line placed 08/26/19 TPN start date: 08/26/19  Nutritional Goals (per RD recommendation on 08/26/19): kCal: 1750-1950, Protein: 86-100 g/day, Fluid: >1.7L/day  Goal TPN rate is 75 mL/hr (provides 90 g of protein and 1818 kCal per day); meeting 100% of patient needs  Current Nutrition:  -Diet advanced to soft this morning -Boost Breeze TID resumed 4/20. 2 of 3 doses charted/24hrs. 25% of meal charted. -TPN  Plan:  Now:  Magnesium sulfate 1 g IV one  Sodium phosphate 10 mmol IV once  At 1800:  Continue TPN at goal rate of 33mL/hr  Electrolytes in TPN: Increase Na, Phos, Mg; Remove K  8mEq/L of Na, 44mEq/L of K, 72mEq/L of Ca, 79mEq/L of Mag, and 44mmol/L of Phos.   Cl:Ac ratio 1:1.   Standard MVI and trace elements in  TPN  Continue moderate SSI q6h and adjust as needed.   MIVF management per MD (LR discontinued 4/18).     Monitor TPN labs on Mon/Thurs. Recheck electrolytes with AM labs tomorrow  Follow to see how patient tolerates advancing diet for weaning TPN  Lenis Noon, PharmD 09/01/19 7:41 AM

## 2019-09-01 NOTE — TOC Initial Note (Addendum)
Transition of Care [facility]) - Initial/Assessment Note    Patient Details  Name: [name] MRN: [id number] Date of Birth: 01/10/1932  [facility]) CM/SW Contact:    Trish Mage, LCSW Phone Number: 09/01/2019, 10:05 AM  Clinical Narrative:   Patient seen in response to PT recommendation of SNF/24 hour supervision.  Patient indicated she is hoping to return home, and feels comfortable with that plan.  [name] gave me permission to call her grandson who lives on same property, and the three of [place] spoke together.  Both cited experience in rehab previously when she had her second amputation, and both felt that the care at home was equal to, if not better, than care at [facility].  [name] is currently working at home, and states he will be available 24/7, if not in home with her will be 2 minutes away if she calls and needs him.  They used [facility] for [facility] services in the past, and would like to use them again if available.  If not, they have no preference for a back up plan.  All needed DME is in the home. TOC will continue to follow during the course of hospitalization.   Addendum:  [name] with [facility] accepted patient for services.                Expected Discharge Plan: [place] Barriers to Discharge: No Barriers Identified   Patient Goals and CMS Choice Patient states their goals for this hospitalization and ongoing recovery are:: "Return home."      Expected Discharge Plan and Services Expected Discharge Plan: [place]   Discharge Planning Services: CM Consult   Living arrangements for the past 2 months: Single Family Home                           HH Arranged: PT, OT, Nurse's Aide HH Agency: [facility] Date [name] Agency Contacted: 09/01/19      Prior Living Arrangements/Services Living arrangements for the past 2 months: [place] Lives with:: Self Patient language and need for interpreter reviewed::  Yes Do you feel safe going back to the place where you live?: Yes      Need for Family Participation in Patient Care: Yes (Comment) Care giver support system in place?: Yes (comment) Current home services: DME Criminal Activity/Legal Involvement Pertinent to Current Situation/Hospitalization: No - Comment as needed  Activities of Daily Living Home Assistive Devices/Equipment: Prosthesis, Walker (specify type), Dentures (specify type)(has bilateral prosthetic legs, full set dentures) ADL Screening (condition at time of admission) Patient's cognitive ability adequate to safely complete daily activities?: Yes Is the patient deaf or have difficulty hearing?: Yes Does the patient have difficulty seeing, even when wearing glasses/contacts?: No Does the patient have difficulty concentrating, remembering, or making decisions?: No Patient able to express need for assistance with ADLs?: Yes Does the patient have difficulty dressing or bathing?: Yes Independently performs ADLs?: No Communication: Independent Dressing (OT): Needs assistance Is this a change from baseline?: Pre-admission baseline Grooming: Independent Feeding: Independent Bathing: Needs assistance Is this a change from baseline?: Pre-admission baseline Toileting: Needs assistance Is this a change from baseline?: Pre-admission baseline In/Out Bed: Needs assistance Is this a change from baseline?: Pre-admission baseline Walks in Home: Independent with device (comment) Does the patient have difficulty walking or climbing stairs?: Yes Weakness of Legs: None Weakness of Arms/Hands: None  Permission Sought/Granted Permission sought  to share information with : Family Supports Permission granted to share information with : Yes, Verbal Permission Granted  Share Information with NAME: [name]     Permission granted to share info w Relationship: grandson     Emotional Assessment Appearance:: Appears stated  age Attitude/Demeanor/Rapport: Engaged Affect (typically observed): Appropriate Orientation: : Oriented to Self, Oriented to Place, Oriented to Situation Alcohol / Substance Use: Not Applicable Psych Involvement: No (comment)  Admission diagnosis:  Large bowel obstruction ([place]) [K56.609] Colonic mass [K63.89] Patient Active Problem List   Diagnosis Date Noted  . Abdominal pain 08/24/2019  . Dehydration 08/24/2019  . Acute prerenal azotemia 08/24/2019  . Large bowel obstruction ([place]) 08/23/2019  . Ischemia of right lower extremity 06/19/2017  . Malnutrition of moderate degree 06/16/2017  . Ischemic foot 06/15/2017  . Cellulitis 06/15/2017  . PAD (peripheral artery disease) ([place])   . Osteoporosis April 26, 2017  . Loss or death of child 2015-06-13  . Status post below-knee amputation of left lower extremity ([place]) 01/31/2015  . Essential hypertension 08/22/2014   PCP:  [name], MD Pharmacy:   [id number] - [place], [address] [name] [address] Phone: [phone] Fax: [phone]     Social Determinants of Health (SDOH) Interventions    Readmission Risk Interventions No flowsheet data found.

## 2019-09-01 NOTE — Progress Notes (Signed)
Home if WBC downtrending and tolerating diet.

## 2019-09-01 NOTE — Progress Notes (Signed)
[facility] Progress Note  2 Days Post-Op  Subjective: CC:  No complaints. Reports some abdominal pain but does not want to take narcotics because they make her "crazy". Tolerating clears. Having gas/stool in ostomy pouch. Up working with PT this AM.   Objective: Vital signs in last 24 hours: Temp:  [97.3 F (36.3 C)-98.5 F (36.9 C)] 98.5 F (36.9 C) (04/22 0524) Pulse Rate:  [87-94] 91 (04/22 0524) Resp:  [16-24] 16 (04/22 0524) BP: (91-110)/(57-70) 104/64 (04/22 0524) SpO2:  [95 %-98 %] 96 % (04/22 0524) Weight:  [62.3 kg] 62.3 kg (04/22 0500) Last BM Date: (ostomy)  Intake/Output from previous day: 04/21 0701 - 04/22 0700 In: 2310 [P.O.:500; I.V.:1810] Out: 700 [Urine:700] Intake/Output this shift: No intake/output data recorded.  PE: Gen:  Alert, NAD, pleasant HEENT: hoarse voice  Card:  Regular rate and rhythm, pedal pulses 2+ BL Pulm:  Normal effort, clear to auscultation bilaterally Abd: soft, appropriately TTP, ABD in place over midline incision C/D/I, LLQ stoma pink and viable with gas and non-bloody stool in pouch. Skin: warm and dry, no rashes  Psych: A&Ox3   Lab Results:  Recent Labs    08/31/19 0348  WBC 10.7*  HGB 11.6*  HCT 35.5*  PLT 279   BMET Recent Labs    08/31/19 0348 09/01/19 0357  NA 135 132*  K 4.3 4.9  CL 103 103  CO2 22 22  GLUCOSE 195* 128*  BUN 17 17  CREATININE 0.47 <0.30*  CALCIUM 8.3* 8.3*   PT/INR No results for input(s): LABPROT, INR in the last 72 hours. CMP     Component Value Date/Time   NA 132 (L) 09/01/2019 0357   K 4.9 09/01/2019 0357   CL 103 09/01/2019 0357   CO2 22 09/01/2019 0357   GLUCOSE 128 (H) 09/01/2019 0357   BUN 17 09/01/2019 0357   CREATININE <0.30 (L) 09/01/2019 0357   CREATININE 0.44 (L) 04/14/2017 0859   CALCIUM 8.3 (L) 09/01/2019 0357   PROT 4.7 (L) 09/01/2019 0357   ALBUMIN 2.0 (L) 09/01/2019 0357   AST 20 09/01/2019 0357   ALT 13 09/01/2019 0357   ALKPHOS 64 09/01/2019  0357   BILITOT 0.4 09/01/2019 0357   GFRNONAA NOT CALCULATED 09/01/2019 0357   GFRAA NOT CALCULATED 09/01/2019 0357   Lipase     Component Value Date/Time   LIPASE 18 08/23/2019 1404       Studies/Results: DG CHEST PORT 1 VIEW  Result Date: 08/31/2019 CLINICAL DATA:  Cough. New diagnosis of adenocarcinoma of the sigmoid colon. EXAM: PORTABLE CHEST 1 VIEW COMPARISON:  None. FINDINGS: Right-sided PICC line is difficult to follow centrally, likely terminating at the superior caval/atrial junction. Patient rotated left. Mild cardiomegaly. Atherosclerosis in the transverse aorta. No pleural effusion or pneumothorax. Biapical pleural thickening. Clear lungs. IMPRESSION: No acute cardiopulmonary disease. Aortic Atherosclerosis (ICD10-I70.0). Electronically Signed   By: Abigail Miyamoto M.D.   On: 08/31/2019 14:09    Anti-infectives: Anti-infectives (From admission, onward)   Start     Dose/Rate Route Frequency Ordered Stop   08/30/19 1144  sodium chloride 0.9 % with cefoTEtan (CEFOTAN) ADS Med  Status:  Discontinued    Note to Pharmacy: Georgena Spurling   : cabinet override      08/30/19 1144 08/30/19 1247   08/30/19 0600  cefoTEtan (CEFOTAN) 2 g in sodium chloride 0.9 % 100 mL IVPB     2 g 200 mL/hr over 30 Minutes Intravenous On call to O.R. 08/29/19 1619 08/30/19  0845       Assessment/Plan HTN GERD Delirium Hx PAD s/p bilateral BKA - plavix held, ok to resume plavix/ASA from surgical perspective  Constipation/abdominal distension LBO Adenocarcinoma of the sigmoid colon with small bowel with thickening and liver mass  POD#2 S/P exploratory laparotomy, sigmoid colectomy, descending colostomy 08/30/19 Dr. Marlou Starks Continue TNA, advance to soft diet  Scheduled tylenol for pain (increase to 650 mg), PRN ibuprofen, add lidoderm patch  PT/OT  WOC RN consult for ostomy care   I called the patients grandson this morning and spoke to him about the patients post-op recovery, he states that if she  is cleared by PT for home with [facility] then he is comfortable assisting with ostomy care.   FEN: advance to SOFT ID: perioperative ancef VTE: SCD's, Lovenox   LOS: 9 days    Obie Dredge, [facility] Surgery Pager: [phone]

## 2019-09-02 ENCOUNTER — Telehealth: Payer: Self-pay | Admitting: Hematology

## 2019-09-02 LAB — CBC
HCT: 28.7 % — ABNORMAL LOW (ref 36.0–46.0)
Hemoglobin: 9.4 g/dL — ABNORMAL LOW (ref 12.0–15.0)
MCH: 30.5 pg (ref 26.0–34.0)
MCHC: 32.8 g/dL (ref 30.0–36.0)
MCV: 93.2 fL (ref 80.0–100.0)
Platelets: 257 10*3/uL (ref 150–400)
RBC: 3.08 MIL/uL — ABNORMAL LOW (ref 3.87–5.11)
RDW: 15.1 % (ref 11.5–15.5)
WBC: 14.6 10*3/uL — ABNORMAL HIGH (ref 4.0–10.5)
nRBC: 0 % (ref 0.0–0.2)

## 2019-09-02 LAB — BASIC METABOLIC PANEL
Anion gap: 7 (ref 5–15)
BUN: 14 mg/dL (ref 8–23)
CO2: 22 mmol/L (ref 22–32)
Calcium: 7.9 mg/dL — ABNORMAL LOW (ref 8.9–10.3)
Chloride: 104 mmol/L (ref 98–111)
Creatinine, Ser: 0.35 mg/dL — ABNORMAL LOW (ref 0.44–1.00)
GFR calc Af Amer: >60 mL/min (ref >60)
GFR calc non Af Amer: >60 mL/min (ref >60)
Glucose, Bld: 120 mg/dL — ABNORMAL HIGH (ref 70–99)
Potassium: 4.1 mmol/L (ref 3.5–5.1)
Sodium: 133 mmol/L — ABNORMAL LOW (ref 135–145)

## 2019-09-02 LAB — MAGNESIUM: Magnesium: 2 mg/dL (ref 1.7–2.4)

## 2019-09-02 LAB — GLUCOSE, CAPILLARY
Glucose-Capillary: 130 mg/dL — ABNORMAL HIGH (ref 70–99)
Glucose-Capillary: 116 mg/dL — ABNORMAL HIGH (ref 70–99)
Glucose-Capillary: 129 mg/dL — ABNORMAL HIGH (ref 70–99)
Glucose-Capillary: 117 mg/dL — ABNORMAL HIGH (ref 70–99)

## 2019-09-02 LAB — PHOSPHORUS: Phosphorus: 4.1 mg/dL (ref 2.5–4.6)

## 2019-09-02 LAB — CANCER ANTIGEN 19-9: CA 19-9: 14 U/mL (ref 0–35)

## 2019-09-02 LAB — AFP TUMOR MARKER: AFP, Serum, Tumor Marker: 1 ng/mL (ref 0.0–8.3)

## 2019-09-02 MED ORDER — ALBUTEROL SULFATE (2.5 MG/3ML) 0.083% IN NEBU: 2.5000 mg | INHALATION_SOLUTION | RESPIRATORY_TRACT | Status: DC | PRN

## 2019-09-02 MED ORDER — METOPROLOL TARTRATE 25 MG PO TABS
25.0000 mg | ORAL_TABLET | Freq: Two times a day (BID) | ORAL | Status: DC
  Administered 2019-09-02 – 2019-09-05 (×6): 25 mg via ORAL
  Filled 2019-09-02 (×6): qty 1

## 2019-09-02 MED ORDER — CLOPIDOGREL BISULFATE 75 MG PO TABS
75.0000 mg | ORAL_TABLET | Freq: Every day | ORAL | Status: DC
  Administered 2019-09-02 – 2019-09-05 (×4): 75 mg via ORAL
  Filled 2019-09-02 (×4): qty 1

## 2019-09-02 MED ORDER — TRAVASOL 10 % IV SOLN
INTRAVENOUS | Status: AC
  Filled 2019-09-02: qty 480

## 2019-09-02 NOTE — Progress Notes (Addendum)
[facility] Progress Note  3 Days Post-Op  Subjective: CC:  No complaints. Very pleasant and appreciative of the care she has received here. Feels less sore than yesterday. Tolerated SOFT diet. Having gas/stool in ostomy pouch.   Objective: Vital signs in last 24 hours: Temp:  [97.4 F (36.3 C)-98.3 F (36.8 C)] 98.2 F (36.8 C) (04/23 0408) Pulse Rate:  [84-95] 85 (04/23 0408) Resp:  [17-19] 17 (04/23 0408) BP: (102-122)/(56-70) 122/70 (04/23 0408) SpO2:  [94 %-99 %] 99 % (04/23 0408) Weight:  [60.1 kg] 60.1 kg (04/23 0404) Last BM Date: 09/01/19  Intake/Output from previous day: 04/22 0701 - 04/23 0700 In: 1567.7 [P.O.:480; I.V.:891.3; IV Piggyback:196.5] Out: 1550 [Urine:1400; Stool:150] Intake/Output this shift: No intake/output data recorded.  PE: Gen:  Alert, NAD, pleasant HEENT: hoarse voice  Card:  Regular rate and rhythm, pedal pulses 2+ BL Pulm:  Normal effort, clear to auscultation bilaterally Abd: soft, appropriately TTP, midline incision closed with staples c/d/i, LLQ stoma pink and viable with gas and non-bloody stool in pouch. Skin: warm and dry, no rashes  Psych: A&Ox3   Lab Results:  Recent Labs    08/31/19 0348  WBC 10.7*  HGB 11.6*  HCT 35.5*  PLT 279   BMET Recent Labs    09/01/19 0357 09/02/19 0350  NA 132* 133*  K 4.9 4.1  CL 103 104  CO2 22 22  GLUCOSE 128* 120*  BUN 17 14  CREATININE <0.30* 0.35*  CALCIUM 8.3* 7.9*   PT/INR No results for input(s): LABPROT, INR in the last 72 hours. CMP     Component Value Date/Time   NA 133 (L) 09/02/2019 0350   K 4.1 09/02/2019 0350   CL 104 09/02/2019 0350   CO2 22 09/02/2019 0350   GLUCOSE 120 (H) 09/02/2019 0350   BUN 14 09/02/2019 0350   CREATININE 0.35 (L) 09/02/2019 0350   CREATININE 0.44 (L) 04/14/2017 0859   CALCIUM 7.9 (L) 09/02/2019 0350   PROT 4.7 (L) 09/01/2019 0357   ALBUMIN 2.0 (L) 09/01/2019 0357   AST 20 09/01/2019 0357   ALT 13 09/01/2019 0357    ALKPHOS 64 09/01/2019 0357   BILITOT 0.4 09/01/2019 0357   GFRNONAA >60 09/02/2019 0350   GFRAA >60 09/02/2019 0350   Lipase     Component Value Date/Time   LIPASE 18 08/23/2019 1404       Studies/Results: DG CHEST PORT 1 VIEW  Result Date: 08/31/2019 CLINICAL DATA:  Cough. New diagnosis of adenocarcinoma of the sigmoid colon. EXAM: PORTABLE CHEST 1 VIEW COMPARISON:  None. FINDINGS: Right-sided PICC line is difficult to follow centrally, likely terminating at the superior caval/atrial junction. Patient rotated left. Mild cardiomegaly. Atherosclerosis in the transverse aorta. No pleural effusion or pneumothorax. Biapical pleural thickening. Clear lungs. IMPRESSION: No acute cardiopulmonary disease. Aortic Atherosclerosis (ICD10-I70.0). Electronically Signed   By: Abigail Miyamoto M.D.   On: 08/31/2019 14:09    Anti-infectives: Anti-infectives (From admission, onward)   Start     Dose/Rate Route Frequency Ordered Stop   08/30/19 1144  sodium chloride 0.9 % with cefoTEtan (CEFOTAN) ADS Med  Status:  Discontinued    Note to Pharmacy: Georgena Spurling   : cabinet override      08/30/19 1144 08/30/19 1247   08/30/19 0600  cefoTEtan (CEFOTAN) 2 g in sodium chloride 0.9 % 100 mL IVPB     2 g 200 mL/hr over 30 Minutes Intravenous On call to O.R. 08/29/19 1619 08/30/19 0845  Assessment/Plan HTN GERD Delirium Hx PAD s/p bilateral BKA - plavix held, ok to resume plavix/ASA from surgical perspective  Constipation/abdominal distension LBO Adenocarcinoma of the sigmoid colon with small bowel with thickening and liver mass  POD#3 S/P exploratory laparotomy, sigmoid colectomy, descending colostomy 08/30/19 Dr. Marlou Starks  Decrease TNA support tonight, continue soft diet  Scheduled tylenol for pain (increase to 650 mg), PRN ibuprofen, lidoderm patch  PT/OT  WOC RN consult for ostomy care - saw their note and plans to arrange for ostomy teaching with patients grandson, byron.   FEN:  SOFT ID:  perioperative ancef VTE: SCD's, Lovenox Dispo: HH PT/OT vs SNF   Our office is scheduling her for staple removal and follow up with Dr. Marlou Starks.    LOS: 10 days    Obie Dredge, [facility] Pager: [phone]

## 2019-09-02 NOTE — Progress Notes (Signed)
Patient instructed on IS and reinstructed on flutter. Patient performed both satisfactory and demonstrated understanding.

## 2019-09-02 NOTE — Progress Notes (Signed)
PT Cancellation Note  Patient Details Name: CLARIBELLE SHUTTER MRN: [id number] DOB: 1932/01/29   Cancelled Treatment:     Ostomy Nurse in room educating family.  Will attempt to see later as schedule permits.    Nathanial Rancher 09/02/2019, 4:25 PM

## 2019-09-02 NOTE — Discharge Instructions (Signed)
[facility]      [facility] [phone]  OPEN ABDOMINAL SURGERY: POST OP INSTRUCTIONS  Always review your discharge instruction sheet given to you by the facility where your surgery was performed.  IF YOU HAVE DISABILITY OR FAMILY LEAVE FORMS, YOU MUST BRING THEM TO THE OFFICE FOR PROCESSING.  PLEASE DO NOT GIVE THEM TO YOUR DOCTOR.  1. A prescription for pain medication may be given to you upon discharge.  Take your pain medication as prescribed, if needed.  If narcotic pain medicine is not needed, then you may take acetaminophen (Tylenol) or ibuprofen (Advil) as needed. 2. Take your usually prescribed medications unless otherwise directed. 3. If you need a refill on your pain medication, please contact your pharmacy. They will contact our office to request authorization.  Prescriptions will not be filled after 5pm or on week-ends. 4. You should follow a light diet the first few days after arrival home, such as soup and crackers, pudding, etc.unless your doctor has advised otherwise. A high-fiber, low fat diet can be resumed as tolerated.   Be sure to include lots of fluids daily. Most patients will experience some swelling and bruising on the chest and neck area.  Ice packs will help.  Swelling and bruising can take several days to resolve 5. Most patients will experience some swelling and bruising in the area of the incision. Ice pack will help. Swelling and bruising can take several days to resolve..  6. It is common to experience some constipation if taking pain medication after surgery.  Increasing fluid intake and taking a stool softener will usually help or prevent this problem from occurring.  A mild laxative (Milk of Magnesia or Miralax) should be taken according to package directions if there are no bowel movements after 48 hours. 7.  You may have steri-strips (small skin tapes) in place directly over the incision.  These strips should be left on the skin for 7-10 days.  If your  surgeon used skin glue on the incision, you may shower in 24 hours.  The glue will flake off over the next 2-3 weeks.  Any sutures or staples will be removed at the office during your follow-up visit. You may find that a light gauze bandage over your incision may keep your staples from being rubbed or pulled. You may shower and replace the bandage daily. 8. ACTIVITIES:  You may resume regular (light) daily activities beginning the next day--such as daily self-care, walking, climbing stairs--gradually increasing activities as tolerated.  You may have sexual intercourse when it is comfortable.  Refrain from any heavy lifting or straining until approved by your doctor. a. You may drive when you no longer are taking prescription pain medication, you can comfortably wear a seatbelt, and you can safely maneuver your car and apply brakes b. Return to Work: ___________________________________ 9. You should see your doctor in the office for a follow-up appointment approximately two weeks after your surgery.  Make sure that you call for this appointment within a day or two after you arrive home to insure a convenient appointment time. OTHER INSTRUCTIONS:  _____________________________________________________________ _____________________________________________________________  WHEN TO CALL YOUR DOCTOR: 1. Fever over 101.0 2. Inability to urinate 3. Nausea and/or vomiting 4. Extreme swelling or bruising 5. Continued bleeding from incision. 6. Increased pain, redness, or drainage from the incision. 7. Difficulty swallowing or breathing 8. Muscle cramping or spasms. 9. Numbness or tingling in hands or feet or around lips.  The clinic staff is available to   answer your questions during regular business hours.  Please don't hesitate to call and ask to speak to one of the nurses if you have concerns.  For further questions, please visit [email or web address]   

## 2019-09-02 NOTE — Telephone Encounter (Signed)
A hospital f/u appt has been scheduled for [name] to see Dr. [name] on 5/10 at 1pm w/labs at 1:45pm.

## 2019-09-02 NOTE — Progress Notes (Signed)
PHARMACY - TOTAL PARENTERAL NUTRITION CONSULT NOTE   Indication: sigmoid colon mass with large bowel obstruction  Patient Measurements: Height: 4' 2.25" (127.6 cm) Weight: 60.1 kg (132 lb 7.9 oz) IBW/kg (Calculated) : 23.08 TPN AdjBW (KG): 32 Body mass index is 36.89 kg/m.  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 Pharmacy consulted for TPN.  Glucose / Insulin: No hx of diabetes. CBGs at goal <150. 8 units SSI/24 hrs.  Electrolytes: Na (133) remains slightly low. All other lytes WNL including CorrCa (9.5).  (Goal K+ =/> 4, Mag =/> 2). Renal: SCr low, baseline. Stable. BUN WNL LFTs / TGs: LFTs WNL, Triglycerides 43 (4/19) Prealbumin / albumin: Low: 5.4 (4/16) >> 5.8 (4/19)/ 2 Intake / Output; MIVF:  LR stopped 4/18. GI Imaging: 4/13 CT abdomen: infiltrating sigmoid colon mass suspicious for colon CA resulting in obstruction.  Surgeries / Procedures:  4/15: flex sig 4/20: exploratory laparotomy, sigmoid colectomy with descending colostomy  Central access: PICC line placed 08/26/19 TPN start date: 08/26/19  Nutritional Goals (per RD recommendation on 08/26/19): kCal: 1750-1950, Protein: 86-100 g/day, Fluid: >1.7L/day  Goal TPN rate is 75 mL/hr (provides 90 g of protein and 1818 kCal per day); meeting 100% of patient needs  Current Nutrition:  -Diet advanced to soft yesterday,  -Boost Breeze TID resumed 4/20. 2 of 3 doses charted/24hrs. -TPN  Minimal PO intake charted yesterday, however, pt ate 80% of breakfast this morning. Per discussion with surgery, will decrease TPN to 1/2 of goal rate tonight and if pt tolerates diet, hopefully wean off tomorrow.  Plan:  At 1800:  Decrease TPN to 40 mL/hr (1/2 of goal rate)  Electrolytes in TPN: Increase Na; Add K  117mEq/L of Na, 44mEq/L of K, 23mEq/L of Ca, 79mEq/L of Mag, and 42mmol/L of Phos.   Cl:Ac ratio 1:1.   Standard MVI and trace  elements in TPN  Continue moderate SSI q6h and adjust as needed.   MIVF management per MD (LR discontinued 4/18).     Monitor TPN labs on Mon/Thurs. Recheck electrolytes with AM labs tomorrow  If patient tolerates diet today, hopefully can wean off of TPN tomorrow.   Lenis Noon, PharmD 09/02/19 10:32 AM

## 2019-09-02 NOTE — Progress Notes (Signed)
Nutrition Follow-up  DOCUMENTATION CODES:   Not applicable  INTERVENTION:  Continue Boost Breeze TID Encourage po intake of meals and supplements TPN per pharmacist   NUTRITION DIAGNOSIS:   Inadequate oral intake related to altered GI function(sigmoid colon mass with large distal colonic obstruction) as evidenced by NPO status. Progressing, diet advanced to soft and TPN initiated.    GOAL:   Patient will meet greater than or equal to 90% of their needs Met with TPN    MONITOR:   Labs, I & O's, Diet advancement, Weight trends  REASON FOR ASSESSMENT:   Consult New TPN/TNA  ASSESSMENT:   86 year old female with past medical history significant for PVD s/p left BKA (2016) followed by right BKA (2019), HTN, admitted on 4/13 for large bowel obstruction after presenting to ED from home with complaints of generalized crampy abdominal discomfort and no bowel movement over the last 3-4 days.  Significant Events: 4/13 admit for large bowel obstruction, CT showed sigmoid colon mass, distal colonic obstruction with mets to liver 4/14 NG tube placed in IR, pt pulled overnight 4/15 flex sigmoidoscopy with biopsy  4/15 diet advanced to CLD 4/17 TPN 4/20 ex-lap, sigmoid colectomy with descending colostomy 4/23 weaning TPN  Patient and family member receiving education at RD attempt to see patient this afternoon.   Diet advanced to CL on 4/20, per flowsheets she consumed 25% of CL dinner on 4/21. On 4/22 diet advanced to soft, she consumed 75% of breakfast, 50% of lunch and 0% of dinner on 4/22 and ate 80% of breakfast this morning. Patient is consuming Boost Breeze supplement TID >75% per medication review.  Per notes: - continue with soft diet, TPN cut in half today - wean off TPN tomorrow if pt continues to tolerate soft diet -delirium improved -plans for d/c home when off TPN and tolerated oral diet  Current wt 132.22 lbs     Admit wt 130.22 lbs Medications reviewed and  include: SSI TPN @ 40 ml/hr provides 909 kcal and 45 grams of protein Labs: CBGs 116,130,136,125, Na 133 (L)   Diet Order:   Diet Order            DIET SOFT Room service appropriate? Yes; Fluid consistency: Thin  Diet effective now              EDUCATION NEEDS:   No education needs have been identified at this time  Skin:  Skin Assessment: Skin Integrity Issues: Skin Integrity Issues:: Other (Comment) Other: MASD; buttocks  Last BM:  4/22 type 7  Height:   Ht Readings from Last 1 Encounters:  08/30/19 4' 2.25" (1.276 m)    Weight:   Wt Readings from Last 1 Encounters:  09/02/19 60.1 kg    Ideal Body Weight:  54.1 kg(Adjusted IBW for bilateral BKA)  BMI:  Body mass index is 36.89 kg/m.  Estimated Nutritional Needs:   Kcal:  2993-7169  Protein:  86-100  Fluid:  >/= 1.7 L/day   Lajuan Lines, RD, LDN Clinical Nutrition After Hours/Weekend Pager # in [place]

## 2019-09-02 NOTE — Consult Note (Signed)
[facility] Nurse ostomy follow up Stoma type/location: LLQ, end colstomy Stomal assessment/size: 1 3/8" round, slightly budded Peristomal assessment: NA Treatment options for stomal/peristomal skin: NA Output soft; brown Ostomy pouching: 1pc. Convex  Education provided:  Met with patient's son [name] at the bedside. Patient continues to be confused, hallucinating while we are at the bedside.  Explained role of ostomy nurse and creation of stoma. [name] is very knowledgeable about this Explained stoma characteristics (budded, flush, color, texture, care) Talked through (bedside nurse had changed pouch just prior to my visit) pouch change (cutting new skin barrier, measuring stoma, cleaning peristomal skin and stoma, use of barrier ring) Allowed son to cut new skin barrier Education on emptying when 1/3 to 1/2 full and how to empty Demonstrated use of wick to clean spout  Discussed bathing, diet, gas, medication use, constipation Discussed risk of peristomal hernia Son verbalized back to [facility] nurse each step of pouch change. Discussed ordering supplies; coverage.         Enrolled patient in [place] Start Discharge program: Yes  [place] Nurse will follow along with you for continued support with ostomy teaching and care [place] MSN, [place], [place], [place], [place]

## 2019-09-02 NOTE — Progress Notes (Addendum)
PROGRESS NOTE    [name]  [id number] DOB: 1932-01-28 DOA: 08/23/2019 PCP: [name], MD   Brief Narrative: 86 year old with past medical history significant for PAD status post bilateral BKA.  She was admitted on 4/13 with large bowel obstruction.  CT showed sigmoid colon mass with distal colonic obstruction with metastasis to liver.  Surgery following and GI was consulted. Patient underwent flex sig 4/15 had a malignant partially obstructing tumor of the sigmoid colon, pathology with adenocarcinoma. -She was a started on TPN, hospital course complicated by delirium, patient underwent exploratory laparotomy, sigmoid colectomy with descending colostomy on 4/20.   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  Patient was n.p.o., received IV fluids and supportive care. -Underwent flex sigmoidoscopy on 4/15 with malignant partially obstructing tumor in the sigmoid colon, biopsy, pathology showed adenocarcinoma. -General surgery following -Started on TPN on 4/17 -Underwent exploratory laparotomy and sigmoid colectomy with descending colostomy on 4/20. -Continue with soft diet . Wean of TPN when possible. Plan to reduce TN today.  -Oncology consulted.  2-Delirium, agitation from 417 until 4/18 Patient developed hospital delirium, in the setting of IV narcotics, worsened after overnight dose of Haldol for agitation. He is improved.  CT head unremarkable, no evidence of infection, ABG without hypercapnia stable  3-Dehydration: Resolved with IV fluids and TPN 4-Cough: Start nebulizer treatment, guaifenesin, chest x ray negative for PNA.  Flutter valve.  5-history of PAD: Bilateral  BKA.  Resume plavix today. Ok per sx 6-Hypertension: Continue to hold lisinopril. 7-GERD: Continue with PPI 8-Oral thrush; started  Nystatin  9-SVT; start metoprolol. Check CBC   Nutrition Problem: Inadequate oral intake Etiology: altered GI function(sigmoid colon mass with large distal colonic obstruction)    Signs/Symptoms: NPO status    Interventions: Refer to RD note for recommendations  Estimated body mass index is 36.89 kg/m as calculated from the following:   Height as of this encounter: 4' 2.25" (1.276 m).   Weight as of this encounter: 60.1 kg.   DVT prophylaxis: Lovenox Code Status: DNR Family Communication: Grandson 4/21 Disposition Plan:  Patient is from: Home Anticipated d/c date: 2  days when she is able to tolerate diet and is wean off TPN.  Barriers to d/c or necessity for inpatient status: Patient status post sigmoid colectomy day 2, Advancing diet today,  requiring TPN.  Consultants:   General surgery  Oncology  Procedures:   Sigmoid colectomy  Antimicrobials:  None  Subjective: She denies worsening abdominal pain.  Still report cough  Objective: Vitals:   09/01/19 2141 09/02/19 0404 09/02/19 0408 09/02/19 0842  BP: 107/67  122/70   Pulse: 95  85 92  Resp: 18  17 16   Temp: (!) 97.4 F (36.3 C)  98.2 F (36.8 C)   TempSrc:      SpO2: 98%  99% 95%  Weight:  60.1 kg    Height:        Intake/Output Summary (Last 24 hours) at 09/02/2019 1500 Last data filed at 09/02/2019 0939 Gross per 24 hour  Intake 60 ml  Output 2150 ml  Net -2090 ml   Filed Weights   08/31/19 0555 09/01/19 0500 09/02/19 0404  Weight: 58.5 kg 62.3 kg 60.1  kg    Examination:  General exam: NAD Respiratory system: CTA Cardiovascular system: S 1, S 2 RRR Gastrointestinal system: BS present, soft , nt Central nervous system: Alert, oriented Extremities: Symmetric power Skin; no rashes    Data Reviewed: I have personally reviewed following labs and  imaging studies  CBC: Recent Labs  Lab 08/27/19 0410 08/28/19 0934 08/29/19 0200 08/31/19 0348  WBC 7.2 7.0 8.8 10.7*  NEUTROABS 5.2  --  6.2  --   HGB 10.9* 11.0* 10.9* 11.6*  HCT 34.0* 33.9* 33.8* 35.5*  MCV 92.9 92.6 93.4 93.2  PLT 300 297 300 [id number]   Basic Metabolic Panel: Recent Labs  Lab 08/29/19 0200 08/30/19 0445 08/31/19 0348 09/01/19 0357 09/02/19 0350  NA 137 134* 135 132* 133*  K 4.2 4.4 4.3 4.9 4.1  CL 105 104 103 103 104  CO2 24 24 22 22 22   GLUCOSE 95 122* 195* 128* 120*  BUN 12 16 17 17 14   CREATININE <0.30* <0.30* 0.47 <0.30* 0.35*  CALCIUM 8.1* 8.2* 8.3* 8.3* 7.9*  MG 2.0 2.0 2.0 1.9 2.0  PHOS 3.5 3.9 3.5 2.7 4.1   GFR: Estimated Creatinine Clearance: 30.8 mL/min (A) (by C-G formula based on SCr of 0.35 mg/dL (L)). Liver Function Tests: Recent Labs  Lab 08/27/19 0410 08/28/19 0146 08/29/19 0200 09/01/19 0357  AST 24 26 24 20   ALT 16 17 15 13   ALKPHOS 95 87 87 64  BILITOT 0.8 0.4 0.3 0.4  PROT 5.1* 5.1* 5.2* 4.7*  ALBUMIN 2.5* 2.5* 2.6* 2.0*   No results for input(s): LIPASE, AMYLASE in the last 168 hours. No results for input(s): AMMONIA in the last 168 hours. Coagulation Profile: No results for input(s): INR, PROTIME in the last 168 hours. Cardiac Enzymes: No results for input(s): CKTOTAL, CKMB, CKMBINDEX, TROPONINI in the last 168 hours. BNP (last 3 results) No results for input(s): PROBNP in the last 8760 hours. HbA1C: No results for input(s): HGBA1C in the last 72 hours. CBG: Recent Labs  Lab 09/01/19 1155 09/01/19 1836 09/01/19 2324 09/02/19 0520 09/02/19 1207  GLUCAP 128* 125* 136* 130* 116*   Lipid Profile: No results for input(s): CHOL, HDL, LDLCALC, TRIG, CHOLHDL, LDLDIRECT in the last 72 hours. Thyroid Function Tests: No results for input(s): TSH, T4TOTAL, FREET4, T3FREE, THYROIDAB in the last 72 hours. Anemia Panel: No results for input(s): VITAMINB12, FOLATE, FERRITIN, TIBC, IRON, RETICCTPCT in the last 72  hours. Sepsis Labs: No results for input(s): PROCALCITON, LATICACIDVEN in the last 168 hours.  Recent Results (from the past 240 hour(s))  Respiratory Panel by RT PCR (Flu A&B, Covid) - Nasopharyngeal Swab     Status: None   Collection Time: 08/23/19  6:35 PM   Specimen: Nasopharyngeal Swab  Result Value Ref Range Status   SARS Coronavirus 2 by RT PCR NEGATIVE NEGATIVE Final    Comment: (NOTE) SARS-CoV-2 target nucleic acids are NOT DETECTED. The SARS-CoV-2 RNA is generally detectable in upper respiratoy specimens during the acute phase of infection. The lowest concentration of SARS-CoV-2 viral copies this assay can detect is 131 copies/mL. A negative result does not preclude SARS-Cov-2 infection and should not be used as the sole basis for treatment or other patient management decisions. A negative result may occur with  improper specimen collection/handling, submission of specimen other than nasopharyngeal swab, presence of viral mutation(s) within the areas targeted by this assay, and inadequate number of viral copies (<131 copies/mL). A negative result must be combined with clinical observations, patient history, and  epidemiological information. The expected result is Negative. Fact Sheet for Patients:  PinkCheek.be Fact Sheet for Healthcare Providers:  GravelBags.it This test is not yet ap proved or cleared by the [place] FDA and  has been authorized for detection and/or diagnosis of SARS-CoV-2 by FDA under an Emergency Use Authorization (EUA). This EUA will remain  in effect (meaning this test can be used) for the duration of the COVID-19 declaration under Section 564(b)(1) of the Act, 21 U.S.C. section 360bbb-3(b)(1), unless the authorization is terminated or revoked sooner.    Influenza A by PCR NEGATIVE NEGATIVE Final   Influenza B by PCR NEGATIVE NEGATIVE Final    Comment: (NOTE) The Xpert Xpress  SARS-CoV-2/FLU/RSV assay is intended as an aid in  the diagnosis of influenza from Nasopharyngeal swab specimens and  should not be used as a sole basis for treatment. Nasal washings and  aspirates are unacceptable for Xpert Xpress SARS-CoV-2/FLU/RSV  testing. Fact Sheet for Patients: PinkCheek.be Fact Sheet for Healthcare Providers: GravelBags.it This test is not yet approved or cleared by the [place] FDA and  has been authorized for detection and/or diagnosis of SARS-CoV-2 by  FDA under an Emergency Use Authorization (EUA). This EUA will remain  in effect (meaning this test can be used) for the duration of the  Covid-19 declaration under Section 564(b)(1) of the Act, 21  U.S.C. section 360bbb-3(b)(1), unless the authorization is  terminated or revoked. Performed at [facility], [place] [address], [place], [place] [address]   Culture, Urine     Status: None   Collection Time: 08/28/19 12:25 PM   Specimen: Urine, Random  Result Value Ref Range Status   Specimen Description   Final    URINE, RANDOM Performed at [place] [address] [place], [place] [address]    Special Requests   Final    NONE Performed at [facility], [place] [address], [place], [place] [address]    Culture   Final    NO GROWTH Performed at [facility], [place] [address], [place], [place] [address]    Report Status 08/29/2019 FINAL  Final         Radiology Studies: No results found.      Scheduled Meds: . acetaminophen  650 mg Oral Q6H  . Chlorhexidine Gluconate Cloth  6 each Topical Daily  . clopidogrel  75 mg Oral Daily  . diclofenac Sodium  2 g Topical QID  . enoxaparin (LOVENOX) injection  40 mg Subcutaneous Q24H  . feeding supplement  1 Container Oral TID BM  . guaiFENesin  600 mg Oral BID  . insulin aspart  0-15 Units Subcutaneous Q6H  . lidocaine  1 patch  Transdermal Q24H  . nicotine  14 mg Transdermal Daily  . nystatin  5 mL Oral QID  . pantoprazole (PROTONIX) IV  40 mg Intravenous Daily  . sodium chloride flush  10-40 mL Intracatheter Q12H  . sodium chloride flush  3 mL Intravenous Q12H   Continuous Infusions: . TPN ADULT (ION) 75 mL/hr at 09/01/19 1812  . TPN ADULT (ION)       LOS: 10 days    Time spent: 35 unit     Mackie Goon A Caesar Mannella, MD [facility]   If 7PM-7AM, please contact night-coverage [email or web address]  09/02/2019, 3:00 PM

## 2019-09-03 ENCOUNTER — Inpatient Hospital Stay (HOSPITAL_COMMUNITY): Payer: Medicare Other

## 2019-09-03 LAB — CBC
HCT: 29.9 % — ABNORMAL LOW (ref 36.0–46.0)
Hemoglobin: 9.8 g/dL — ABNORMAL LOW (ref 12.0–15.0)
MCH: 30.4 pg (ref 26.0–34.0)
MCHC: 32.8 g/dL (ref 30.0–36.0)
MCV: 92.9 fL (ref 80.0–100.0)
Platelets: 286 10*3/uL (ref 150–400)
RBC: 3.22 MIL/uL — ABNORMAL LOW (ref 3.87–5.11)
RDW: 15.1 % (ref 11.5–15.5)
WBC: 15.2 10*3/uL — ABNORMAL HIGH (ref 4.0–10.5)
nRBC: 0 % (ref 0.0–0.2)

## 2019-09-03 LAB — URINALYSIS, ROUTINE W REFLEX MICROSCOPIC
Bilirubin Urine: NEGATIVE
Glucose, UA: NEGATIVE mg/dL
Hgb urine dipstick: NEGATIVE
Ketones, ur: NEGATIVE mg/dL
Nitrite: NEGATIVE
Protein, ur: NEGATIVE mg/dL
Specific Gravity, Urine: 1.024 (ref 1.005–1.030)
WBC, UA: >50 WBC/hpf — ABNORMAL HIGH (ref 0–5)
pH: 5 (ref 5.0–8.0)

## 2019-09-03 LAB — BASIC METABOLIC PANEL
Anion gap: 8 (ref 5–15)
BUN: 14 mg/dL (ref 8–23)
CO2: 23 mmol/L (ref 22–32)
Calcium: 7.9 mg/dL — ABNORMAL LOW (ref 8.9–10.3)
Chloride: 105 mmol/L (ref 98–111)
Creatinine, Ser: 0.3 mg/dL — ABNORMAL LOW (ref 0.44–1.00)
GFR calc Af Amer: >60 mL/min (ref >60)
GFR calc non Af Amer: >60 mL/min (ref >60)
Glucose, Bld: 122 mg/dL — ABNORMAL HIGH (ref 70–99)
Potassium: 3.5 mmol/L (ref 3.5–5.1)
Sodium: 136 mmol/L (ref 135–145)

## 2019-09-03 LAB — GLUCOSE, CAPILLARY
Glucose-Capillary: 113 mg/dL — ABNORMAL HIGH (ref 70–99)
Glucose-Capillary: 116 mg/dL — ABNORMAL HIGH (ref 70–99)
Glucose-Capillary: 122 mg/dL — ABNORMAL HIGH (ref 70–99)
Glucose-Capillary: 182 mg/dL — ABNORMAL HIGH (ref 70–99)

## 2019-09-03 LAB — PHOSPHORUS: Phosphorus: 3.9 mg/dL (ref 2.5–4.6)

## 2019-09-03 LAB — MAGNESIUM: Magnesium: 2 mg/dL (ref 1.7–2.4)

## 2019-09-03 MED ORDER — CEPHALEXIN 500 MG PO CAPS
500.0000 mg | ORAL_CAPSULE | Freq: Three times a day (TID) | ORAL | Status: DC
  Administered 2019-09-03 – 2019-09-05 (×5): 500 mg via ORAL
  Filled 2019-09-03 (×6): qty 1

## 2019-09-03 MED ORDER — POTASSIUM CHLORIDE CRYS ER 20 MEQ PO TBCR
40.0000 meq | EXTENDED_RELEASE_TABLET | Freq: Once | ORAL | Status: DC
  Filled 2019-09-03: qty 2

## 2019-09-03 MED ORDER — POTASSIUM CHLORIDE 20 MEQ PO PACK
40.0000 meq | PACK | Freq: Once | ORAL | Status: AC
  Administered 2019-09-03: 40 meq via ORAL
  Filled 2019-09-03: qty 2

## 2019-09-03 MED ORDER — PANTOPRAZOLE SODIUM 40 MG PO TBEC
40.0000 mg | DELAYED_RELEASE_TABLET | Freq: Every day | ORAL | Status: DC
  Administered 2019-09-03 – 2019-09-05 (×3): 40 mg via ORAL
  Filled 2019-09-03 (×3): qty 1

## 2019-09-03 NOTE — Progress Notes (Addendum)
PHARMACY - TOTAL PARENTERAL NUTRITION CONSULT NOTE   Indication: sigmoid colon mass with large bowel obstruction  Patient Measurements: Height: 4' 2.25" (127.6 cm) Weight: 59.5 kg (131 lb 2.8 oz) IBW/kg (Calculated) : 23.08 TPN AdjBW (KG): 32 Body mass index is 36.52 kg/m.  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 Pharmacy consulted for TPN.  Glucose / Insulin: No hx of diabetes. CBGs at goal <150; only 2 units SSI yesterday after weaning TPN Electrolytes: WNL except K slightly low (Goal K+ =/> 4, Mag =/> 2); repletion already ordered per MD Renal: SCr low, baseline. Stable. BUN WNL LFTs / TGs: LFTs WNL, Triglycerides 43 (4/19) Prealbumin / albumin: Low: 5.4 (4/16) >> 5.8 (4/19)/ 2 Intake / Output; MIVF:  LR stopped 4/18. GI Imaging: 4/13 CT abdomen: infiltrating sigmoid colon mass suspicious for colon CA resulting in obstruction.  Surgeries / Procedures:  4/15: flex sig 4/20: exploratory laparotomy, sigmoid colectomy with descending colostomy  Central access: PICC line placed 08/26/19 TPN start date: 08/26/19  Nutritional Goals (per RD recommendation on 08/26/19): kCal: 1750-1950, Protein: 86-100 g/day, Fluid: >1.7L/day  Goal TPN rate is 75 mL/hr (provides 90 g of protein and 1818 kCal per day); meeting 100% of patient needs  Current Nutrition:  - Soft diet; tolerating ~50% - Boost Breeze TID resumed 4/20; taking 2 of 3 doses routinely per day (500 cal, 18 g protein)  Plan:   KCl per MD  Can convert Protonix back to PO; 40 mg daily  Can stop TPN once bag runs out tonight; Surgery in agreement  Annete Ayuso A, PharmD 09/03/19 9:11 AM

## 2019-09-03 NOTE — Progress Notes (Signed)
PROGRESS NOTE    [name]  [id number] DOB: 14-Dec-1931 DOA: 08/23/2019 PCP: [name], MD   Brief Narrative: 86 year old with past medical history significant for PAD status post bilateral BKA.  She was admitted on 4/13 with large bowel obstruction.  CT showed sigmoid colon mass with distal colonic obstruction with metastasis to liver.  Surgery following and GI was consulted. Patient underwent flex sig 4/15 had a malignant partially obstructing tumor of the sigmoid colon, pathology with adenocarcinoma. -She was a started on TPN, hospital course complicated by delirium, patient underwent exploratory laparotomy, sigmoid colectomy with descending colostomy on 4/20.   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  Patient was n.p.o., received IV fluids and supportive care. -Underwent flex sigmoidoscopy on 4/15 with malignant partially obstructing tumor in the sigmoid colon, biopsy, pathology showed adenocarcinoma. -General surgery following -Started on TPN on 4/17 -Underwent exploratory laparotomy and sigmoid colectomy with descending colostomy on 4/20. -Continue with soft diet . Wean of TPN when possible. Plan to reduce TN today.  -Oncology consulted.  2-Delirium, agitation from 417 until 4/18; Patient developed hospital delirium, in the setting of IV narcotics, worsened after overnight dose of Haldol for agitation. He is improved.  CT head unremarkable, no evidence of infection, ABG without hypercapnia stable  3-Dehydration: Resolved with IV fluids and TPN 4-Cough: Start nebulizer treatment, guaifenesin, chest x ray negative for PNA.  Flutter valve. Improved.  5-history of  PAD: Bilateral BKA.  Resume plavix today. Ok per sx 6-Hypertension: Continue to hold lisinopril. 7-GERD: Continue with PPI 8-Oral thrush; started  Nystatin  9-SVT; start metoprolol. Check CBC  10-Leukocytosis; repeated chest x ray negative for PNA. Check UA. She has some redness flank area, start keflex cover for cellulitis.  11-Cellulitis, flank area, near ostomy bag. Start keflex  Nutrition Problem: Inadequate oral intake Etiology: altered GI function(sigmoid colon mass with large distal colonic obstruction)    Signs/Symptoms: NPO status    Interventions: Refer to RD note for recommendations  Estimated body mass index is 36.52 kg/m as calculated from the following:   Height as of this encounter: 4' 2.25" (1.276 m).   Weight as of this encounter: 59.5 kg.   DVT prophylaxis: Lovenox Code Status: DNR Family Communication: Grandson 4/24 Disposition Plan:  Patient is from: Home Anticipated d/c date: home Monday or Tuesday depending WBC and if tolerating diet.  Barriers to d/c or necessity for inpatient status: Patient status post sigmoid colectomy day 2, Advancing diet today,  requiring TPN.  Consultants:   General surgery  Oncology  Procedures:   Sigmoid colectomy  Antimicrobials:  None  Subjective: Cough is better. She will take one day at a time.  She report mild abdominal pain  Objective: Vitals:   09/02/19 1830 09/02/19 2040 09/03/19 0527 09/03/19 1401  BP: 124/75 117/68 (!) 148/82 (!) 121/55  Pulse: 90 77 86 87  Resp:  20 20 20   Temp:  98.1 F (36.7 C) 98.2 F (36.8 C) (!) 97.4 F (36.3 C)  TempSrc:  Oral Oral Oral  SpO2:  96% 98% 97%  Weight:   59.5 kg   Height:        Intake/Output Summary (Last 24 hours) at 09/03/2019 1448 Last data  filed at 09/03/2019 1300 Gross per 24 hour  Intake 380 ml  Output 2100 ml  Net -1720 ml   Filed Weights   09/02/19 0404 09/02/19 1132 09/03/19 0527  Weight: 60.1 kg 60.8 kg 59.5 kg     Examination:  General exam: NAD Respiratory system: CTA Cardiovascular system: S 1, S 2 RRR Gastrointestinal system: BS present, soft, nt Central nervous system: Alert.  Extremities: Symmetric power Skin; No rashes    Data Reviewed: I have personally reviewed following labs and imaging studies  CBC: Recent Labs  Lab 08/28/19 0934 08/29/19 0200 08/31/19 0348 09/02/19 1907 09/03/19 0830  WBC 7.0 8.8 10.7* 14.6* 15.2*  NEUTROABS  --  6.2  --   --   --   HGB 11.0* 10.9* 11.6* 9.4* 9.8*  HCT 33.9* 33.8* 35.5* 28.7* 29.9*  MCV 92.6 93.4 93.2 93.2 92.9  PLT 297 300 279 257 [id number]   Basic Metabolic Panel: Recent Labs  Lab 08/30/19 0445 08/31/19 0348 09/01/19 0357 09/02/19 0350 09/03/19 0345  NA 134* 135 132* 133* 136  K 4.4 4.3 4.9 4.1 3.5  CL 104 103 103 104 105  CO2 24 22 22 22 23   GLUCOSE 122* 195* 128* 120* 122*  BUN 16 17 17 14 14   CREATININE <0.30* 0.47 <0.30* 0.35* 0.30*  CALCIUM 8.2* 8.3* 8.3* 7.9* 7.9*  MG 2.0 2.0 1.9 2.0 2.0  PHOS 3.9 3.5 2.7 4.1 3.9   GFR: Estimated Creatinine Clearance: 30.6 mL/min (A) (by C-G formula based on SCr of 0.3 mg/dL (L)). Liver Function Tests: Recent Labs  Lab 08/28/19 0146 08/29/19 0200 09/01/19 0357  AST 26 24 20   ALT 17 15 13   ALKPHOS 87 87 64  BILITOT 0.4 0.3 0.4  PROT 5.1* 5.2* 4.7*  ALBUMIN 2.5* 2.6* 2.0*   No results for input(s): LIPASE, AMYLASE in the last 168 hours. No results for input(s): AMMONIA in the last 168 hours. Coagulation Profile: No results for input(s): INR, PROTIME in the last 168 hours. Cardiac Enzymes: No results for input(s): CKTOTAL, CKMB, CKMBINDEX, TROPONINI in the last 168 hours. BNP (last 3 results) No results for input(s): PROBNP in the last 8760 hours. HbA1C: No results for input(s): HGBA1C in the last 72 hours. CBG: Recent Labs  Lab 09/02/19 1758 09/02/19 2038 09/03/19 0010 09/03/19 0633 09/03/19 1203  GLUCAP 129* 117* 113* 116* 182*   Lipid Profile: No results for  input(s): CHOL, HDL, LDLCALC, TRIG, CHOLHDL, LDLDIRECT in the last 72 hours. Thyroid Function Tests: No results for input(s): TSH, T4TOTAL, FREET4, T3FREE, THYROIDAB in the last 72 hours. Anemia Panel: No results for input(s): VITAMINB12, FOLATE, FERRITIN, TIBC, IRON, RETICCTPCT in the last 72 hours. Sepsis Labs: No results for input(s): PROCALCITON, LATICACIDVEN in the last 168 hours.  Recent Results (from the past 240 hour(s))  Culture, Urine     Status: None   Collection Time: 08/28/19 12:25 PM   Specimen: Urine, Random  Result Value Ref Range Status   Specimen Description   Final    URINE, RANDOM Performed at [facility] [address], [place], [place] [address]    Special Requests   Final    NONE Performed at [facility], [place] [address], [place], [place] [address]    Culture   Final    NO GROWTH Performed at [facility], [place] [address] [place], [place] [address]    Report Status 08/29/2019 FINAL  Final         Radiology  Studies: DG CHEST PORT 1 VIEW  Result Date: 09/03/2019 CLINICAL DATA:  Nonproductive cough.  Leukocytosis. EXAM: PORTABLE CHEST 1 VIEW COMPARISON:  August 31, 2019 FINDINGS: There appears to be a 1.6 cm nodule in the right mid lung, in retrospect also seen on yesterday's study. No other nodules or masses. The right PICC line terminates in the central SVC. The right lung is otherwise clear. Mild haziness in the left base is probably mild atelectasis. No other changes. IMPRESSION: 1. Apparent 1.6 cm nodule in the right mid lung. Recommend CT imaging for further evaluation given history of newly diagnosed cancer. 2. The new right PICC line is in good position. 3. Mild atelectasis in the left base. Electronically Signed   By: Dorise Bullion III M.D   On: 09/03/2019 14:17        Scheduled Meds: . acetaminophen  650 mg Oral Q6H  . cephALEXin  500 mg Oral Q8H  . Chlorhexidine Gluconate Cloth  6 each Topical  Daily  . clopidogrel  75 mg Oral Daily  . diclofenac Sodium  2 g Topical QID  . enoxaparin (LOVENOX) injection  40 mg Subcutaneous Q24H  . feeding supplement  1 Container Oral TID BM  . guaiFENesin  600 mg Oral BID  . insulin aspart  0-15 Units Subcutaneous Q6H  . lidocaine  1 patch Transdermal Q24H  . metoprolol tartrate  25 mg Oral BID  . nicotine  14 mg Transdermal Daily  . nystatin  5 mL Oral QID  . pantoprazole  40 mg Oral Daily  . sodium chloride flush  10-40 mL Intracatheter Q12H  . sodium chloride flush  3 mL Intravenous Q12H   Continuous Infusions: . TPN ADULT (ION) 40 mL/hr at 09/02/19 1834     LOS: 11 days    Time spent: 35 unit     Jas Betten A Deidra Spease, MD [facility]   If 7PM-7AM, please contact night-coverage [email or web address]  09/03/2019, 2:48 PM

## 2019-09-03 NOTE — Progress Notes (Signed)
[facility] Office:  [phone] General Surgery Progress Note   LOS: 11 days  POD -  4 Days Post-Op  Assessment and Plan: 1.  EXPLORATORY LAPAROTOMY WITH SIGMOID RESECTION AND COLOSTOMY PLACEMENT - 4/20 - Toth  Sigmoid adenoca with liver mets - follow up with Dr. [name]  WBC - 10,600 - 09/02/2019  On reg diet (though unclear to me how much she is eating) and colostomy functioning  2.  HTN 3.  Bilateral BKA, history of PAD 4.  Anemia - Hgb - 9.4 - 09/02/2019 5.  DVT prophylaxis - Lovenox 6.  Malnutrition - on TPN 7.  On Plavix   Principal Problem:   Large bowel obstruction (HCC) Active Problems:   Essential hypertension   Abdominal pain   Dehydration   Acute prerenal azotemia   Metastatic colon cancer to liver (HCC)  Subjective:  No specific complaint.  A little confused.  But aware of where she is and that her grandson, [name], plans to manage her at home.  Objective:   Vitals:   09/02/19 2040 09/03/19 0527  BP: 117/68 (!) 148/82  Pulse: 77 86  Resp: 20 20  Temp: 98.1 F (36.7 C) 98.2 F (36.8 C)  SpO2: 96% 98%     Intake/Output from previous day:  04/23 0701 - 04/24 0700 In: 60 [P.O.:60] Out: 2100 [Urine:2100]  Intake/Output this shift:  No intake/output data recorded.   Physical Exam:   General: Older WF who is alert.    HEENT: Normal. Pupils equal. .   Lungs: Clear.   Abdomen: Soft.  Some redness of left flank.  Colostomy in LLQ is functioning.   Wound: Staples intact.   Lab Results:    Recent Labs    09/02/19 1907  WBC 14.6*  HGB 9.4*  HCT 28.7*  PLT 257    BMET   Recent Labs    09/02/19 0350 09/03/19 0345  NA 133* 136  K 4.1 3.5  CL 104 105  CO2 22 23  GLUCOSE 120* 122*  BUN 14 14  CREATININE 0.35* 0.30*  CALCIUM 7.9* 7.9*    PT/INR  No results for input(s): LABPROT, INR in the last 72 hours.  ABG  No results for input(s): PHART, HCO3 in the last 72 hours.  Invalid input(s): PCO2, PO2   Studies/Results:  No  results found.   Anti-infectives:   Anti-infectives (From admission, onward)   Start     Dose/Rate Route Frequency Ordered Stop   08/30/19 1144  sodium chloride 0.9 % with cefoTEtan (CEFOTAN) ADS Med  Status:  Discontinued    Note to Pharmacy: Georgena Spurling   : cabinet override      08/30/19 1144 08/30/19 1247   08/30/19 0600  cefoTEtan (CEFOTAN) 2 g in sodium chloride 0.9 % 100 mL IVPB     2 g 200 mL/hr over 30 Minutes Intravenous On call to O.R. 08/29/19 1619 08/30/19 0845      Alphonsa Overall, MD, [facility] Surgery Office: [phone] 09/03/2019

## 2019-09-04 LAB — CBC
HCT: 28 % — ABNORMAL LOW (ref 36.0–46.0)
Hemoglobin: 9.1 g/dL — ABNORMAL LOW (ref 12.0–15.0)
MCH: 29.9 pg (ref 26.0–34.0)
MCHC: 32.5 g/dL (ref 30.0–36.0)
MCV: 92.1 fL (ref 80.0–100.0)
Platelets: 284 10*3/uL (ref 150–400)
RBC: 3.04 MIL/uL — ABNORMAL LOW (ref 3.87–5.11)
RDW: 15.2 % (ref 11.5–15.5)
WBC: 10.8 10*3/uL — ABNORMAL HIGH (ref 4.0–10.5)
nRBC: 0 % (ref 0.0–0.2)

## 2019-09-04 LAB — GLUCOSE, CAPILLARY
Glucose-Capillary: 127 mg/dL — ABNORMAL HIGH (ref 70–99)
Glucose-Capillary: 131 mg/dL — ABNORMAL HIGH (ref 70–99)
Glucose-Capillary: 144 mg/dL — ABNORMAL HIGH (ref 70–99)

## 2019-09-04 LAB — BASIC METABOLIC PANEL
Anion gap: 6 (ref 5–15)
BUN: 16 mg/dL (ref 8–23)
CO2: 25 mmol/L (ref 22–32)
Calcium: 7.9 mg/dL — ABNORMAL LOW (ref 8.9–10.3)
Chloride: 111 mmol/L (ref 98–111)
Creatinine, Ser: 0.37 mg/dL — ABNORMAL LOW (ref 0.44–1.00)
GFR calc Af Amer: >60 mL/min (ref >60)
GFR calc non Af Amer: >60 mL/min (ref >60)
Glucose, Bld: 71 mg/dL (ref 70–99)
Potassium: 3.5 mmol/L (ref 3.5–5.1)
Sodium: 142 mmol/L (ref 135–145)

## 2019-09-04 NOTE — Progress Notes (Addendum)
[facility] Office:  [phone] General Surgery Progress Note   LOS: 12 days  POD -  5 Days Post-Op  Assessment and Plan: 1.  EXPLORATORY LAPAROTOMY WITH SIGMOID RESECTION AND COLOSTOMY PLACEMENT - 4/20 - Toth  Sigmoid adenoca with liver mets - follow up with Dr. [name]  WBC - 64,800 - 09/04/2019  On reg diet (though unclear to me how much she is eating) and colostomy functioning  2.  HTN 3.  Bilateral BKA, history of PAD 4.  Anemia - Hgb - 9.1 - 09/04/2019 5.  DVT prophylaxis - Lovenox 6.  Malnutrition -   Stopped TPN 09/03/2019 7.  On Plavix 8.  Possible UTI  On Keflex - 4/24 >>>   Principal Problem:   Large bowel obstruction (HCC) Active Problems:   Essential hypertension   Abdominal pain   Dehydration   Acute prerenal azotemia   Metastatic colon cancer to liver [facility])  Subjective:  A little more confused this AM.  But aware of where she is and that her grandson, [name], plans to manage her at home.  Objective:   Vitals:   09/03/19 2039 09/04/19 0545  BP: 117/67 124/62  Pulse: 81 79  Resp: 20 19  Temp: 98.2 F (36.8 C) 98.2 F (36.8 C)  SpO2: 94%      Intake/Output from previous day:  04/24 0701 - 04/25 0700 In: 380 [P.O.:380] Out: 950 [Urine:400; Stool:550]  Intake/Output this shift:  No intake/output data recorded.   Physical Exam:   General: Older WF who is alert.    HEENT: Normal. Pupils equal. .   Lungs: Clear.   Abdomen: Soft.  Redness of left flank looks better.  Colostomy in LLQ is functioning.   Wound: Staples intact.   Lab Results:    Recent Labs    09/03/19 0830 09/04/19 0315  WBC 15.2* 10.8*  HGB 9.8* 9.1*  HCT 29.9* 28.0*  PLT 286 284    BMET   Recent Labs    09/03/19 0345 09/04/19 0315  NA 136 142  K 3.5 3.5  CL 105 111  CO2 23 25  GLUCOSE 122* 71  BUN 14 16  CREATININE 0.30* 0.37*  CALCIUM 7.9* 7.9*    PT/INR  No results for input(s): LABPROT, INR in the last 72 hours.  ABG  No results for input(s):  PHART, HCO3 in the last 72 hours.  Invalid input(s): PCO2, PO2   Studies/Results:  DG CHEST PORT 1 VIEW  Result Date: 09/03/2019 CLINICAL DATA:  Nonproductive cough.  Leukocytosis. EXAM: PORTABLE CHEST 1 VIEW COMPARISON:  August 31, 2019 FINDINGS: There appears to be a 1.6 cm nodule in the right mid lung, in retrospect also seen on yesterday's study. No other nodules or masses. The right PICC line terminates in the central SVC. The right lung is otherwise clear. Mild haziness in the left base is probably mild atelectasis. No other changes. IMPRESSION: 1. Apparent 1.6 cm nodule in the right mid lung. Recommend CT imaging for further evaluation given history of newly diagnosed cancer. 2. The new right PICC line is in good position. 3. Mild atelectasis in the left base. Electronically Signed   By: Dorise Bullion III M.D   On: 09/03/2019 14:17     Anti-infectives:   Anti-infectives (From admission, onward)   Start     Dose/Rate Route Frequency Ordered Stop   09/03/19 1400  cephALEXin (KEFLEX) capsule 500 mg     500 mg Oral Every 8 hours 09/03/19 1118 09/08/19 1359  08/30/19 1144  sodium chloride 0.9 % with cefoTEtan (CEFOTAN) ADS Med  Status:  Discontinued    Note to Pharmacy: Georgena Spurling   : cabinet override      08/30/19 1144 08/30/19 1247   08/30/19 0600  cefoTEtan (CEFOTAN) 2 g in sodium chloride 0.9 % 100 mL IVPB     2 g 200 mL/hr over 30 Minutes Intravenous On call to O.R. 08/29/19 1619 08/30/19 0845      Alphonsa Overall, MD, [facility] Surgery Office: [phone] 09/04/2019

## 2019-09-04 NOTE — Progress Notes (Signed)
PROGRESS NOTE    [name]  [id number] DOB: 1931/10/26 DOA: 08/23/2019 PCP: [name], MD   Brief Narrative: 86 year old with past medical history significant for PAD status post bilateral BKA.  She was admitted on 4/13 with large bowel obstruction.  CT showed sigmoid colon mass with distal colonic obstruction with metastasis to liver.  Surgery following and GI was consulted. Patient underwent flex sig 4/15 had a malignant partially obstructing tumor of the sigmoid colon, pathology with adenocarcinoma. -She was a started on TPN, hospital course complicated by delirium, patient underwent exploratory laparotomy, sigmoid colectomy with descending colostomy on 4/20.   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  Patient was n.p.o., received IV fluids and supportive care. -Underwent flex sigmoidoscopy on 4/15 with malignant partially obstructing tumor in the sigmoid colon, biopsy, pathology showed adenocarcinoma. -General surgery following -Started on TPN on 4/17 -Underwent exploratory laparotomy and sigmoid colectomy with descending colostomy on 4/20. -Continue with soft diet .TPN was discontinue.  -Oncology consulted.  2-Delirium, agitation from 417 until 4/18; Patient developed hospital delirium, in the setting of IV narcotics, worsened after overnight dose of Haldol for agitation. He is improved.  CT head unremarkable, no evidence of infection, ABG without hypercapnia stable  3-Dehydration: Resolved with IV fluids and TPN 4-Cough: Start nebulizer treatment, guaifenesin, chest x ray negative for PNA.  Flutter valve. Improved.  5-history of PAD: Bilateral BKA.  Resume plavix  today. Ok per sx 6-Hypertension: Continue to hold lisinopril. 7-GERD: Continue with PPI 8-Oral thrush; started  Nystatin  9-SVT; start metoprolol. Check CBC  10-Leukocytosis; repeated chest x ray negative for PNA. Check UA. She has some redness flank area, start keflex cover for cellulitis. Trending down.  11-Cellulitis, flank area, near ostomy bag. Started keflex, improved.   Nutrition Problem: Inadequate oral intake Etiology: altered GI function(sigmoid colon mass with large distal colonic obstruction)    Signs/Symptoms: NPO status    Interventions: Refer to RD note for recommendations  Estimated body mass index is 36.77 kg/m as calculated from the following:   Height as of this encounter: 4' 2.25" (1.276 m).   Weight as of this encounter: 59.9 kg.   DVT prophylaxis: Lovenox Code Status: DNR Family Communication: Grandson 4/24 Disposition Plan:  Patient is from: Home Anticipated d/c date: home Monday or Tuesday depending WBC and if tolerating diet.  Barriers to d/c or necessity for inpatient status: Patient status post sigmoid colectomy day 2, Advancing diet today,  requiring TPN.  Consultants:   General surgery  Oncology  Procedures:   Sigmoid colectomy  Antimicrobials:  None  Subjective: Feels better, cough improved.  Eating some  Objective: Vitals:   09/03/19 2039 09/04/19 0545 09/04/19 0830 09/04/19 1400  BP: 117/67 124/62 131/65 (!) 116/103  Pulse: 81 79 85 72  Resp: 20 19 17 20   Temp: 98.2 F (36.8 C) 98.2 F (36.8 C) 98.7 F (37.1 C) 98.7 F (37.1 C)  TempSrc: Oral  Oral Oral  SpO2: 94%  100% 98%  Weight:  59.9 kg    Height:        Intake/Output Summary (Last 24 hours) at 09/04/2019 1517 Last data filed at 09/04/2019 1255 Gross per 24 hour  Intake 240 ml  Output 350 ml  Net -110 ml   Filed Weights   09/02/19 1132 09/03/19 0527 09/04/19 0545  Weight: 60.8 kg 59.5 kg 59.9 kg    Examination:  General exam: NAD Respiratory system:  CTA Cardiovascular system: S 1, S 2 RRR Gastrointestinal system: BS present, soft, nt Central nervous system: Alert  Extremities; Symmetric power Skin; No rashes    Data Reviewed: I have personally reviewed following labs and imaging studies  CBC: Recent Labs  Lab 08/29/19 0200 08/31/19 0348 09/02/19 1907 09/03/19 0830 09/04/19 0315  WBC 8.8 10.7* 14.6* 15.2* 10.8*  NEUTROABS 6.2  --   --   --   --   HGB 10.9* 11.6* 9.4* 9.8* 9.1*  HCT 33.8* 35.5* 28.7* 29.9* 28.0*  MCV 93.4 93.2 93.2 92.9 92.1  PLT 300 279 257 286 [id number]   Basic Metabolic Panel: Recent Labs  Lab 08/30/19 0445 08/30/19 0445 08/31/19 0348 09/01/19 0357 09/02/19 0350 09/03/19 0345 09/04/19 0315  NA 134*   < > 135 132* 133* 136 142  K 4.4   < > 4.3 4.9 4.1 3.5 3.5  CL 104   < > 103 103 104 105 111  CO2 24   < > 22 22 22 23 25   GLUCOSE 122*   < > 195* 128* 120* 122* 71  BUN 16   < > 17 17 14 14 16   CREATININE <0.30*   < > 0.47 <0.30* 0.35* 0.30* 0.37*  CALCIUM 8.2*   < > 8.3* 8.3* 7.9* 7.9* 7.9*  MG 2.0  --  2.0 1.9 2.0 2.0  --   PHOS 3.9  --  3.5 2.7 4.1 3.9  --    < > = values in this interval not displayed.   GFR: Estimated Creatinine Clearance: 30.7 mL/min (A) (by C-G formula based on SCr of 0.37 mg/dL (L)). Liver Function Tests: Recent Labs  Lab 08/29/19 0200 09/01/19 0357  AST 24 20  ALT 15 13  ALKPHOS 87 64  BILITOT 0.3 0.4  PROT 5.2* 4.7*  ALBUMIN 2.6* 2.0*   No results for input(s): LIPASE, AMYLASE in the last 168 hours. No results for input(s): AMMONIA in the last 168 hours. Coagulation Profile: No results for input(s): INR, PROTIME in the last 168 hours. Cardiac Enzymes: No results for input(s): CKTOTAL, CKMB, CKMBINDEX, TROPONINI in the last 168 hours. BNP (last 3 results) No results for input(s): PROBNP in the last 8760 hours. HbA1C: No results for input(s): HGBA1C in the last 72 hours. CBG: Recent Labs  Lab 09/03/19 0633 09/03/19 1203 09/03/19 1731 09/04/19 0000  09/04/19 1219  GLUCAP 116* 182* 122* 127* 144*   Lipid Profile: No results for input(s): CHOL, HDL, LDLCALC, TRIG, CHOLHDL, LDLDIRECT in the last 72 hours. Thyroid Function Tests: No results for input(s): TSH, T4TOTAL, FREET4, T3FREE, THYROIDAB in the last 72 hours. Anemia Panel: No results for input(s): VITAMINB12, FOLATE, FERRITIN, TIBC, IRON, RETICCTPCT in the last 72 hours. Sepsis Labs: No results for input(s): PROCALCITON, LATICACIDVEN in the last 168 hours.  Recent Results (from the past 240 hour(s))  Culture, Urine     Status: None   Collection Time: 08/28/19 12:25 PM   Specimen: Urine, Random  Result Value Ref Range Status   Specimen Description   Final    URINE, RANDOM Performed at [facility] [address], [place], [place] [address]    Special Requests   Final    NONE Performed at [facility], [address]  [place], [place] [address]    Culture   Final    NO GROWTH Performed at [facility], [place] [address], [place], [place] [address]    Report Status 08/29/2019 FINAL  Final  Culture, Urine     Status: Abnormal (Preliminary result)   Collection Time: 09/03/19  5:26 PM   Specimen: Urine, Random  Result Value Ref Range Status   Specimen Description   Final    URINE, RANDOM Performed at [facility] [address] [place], [place] [address]    Special Requests   Final    keflex Performed at [facility] [address], [place], [place] [address]    Culture (A)  Final    >=100,000 COLONIES/mL ESCHERICHIA COLI SUSCEPTIBILITIES TO FOLLOW Performed at [facility], [place] [address], [place], [place] [address]    Report Status PENDING  Incomplete         Radiology Studies: DG CHEST PORT 1 VIEW  Result Date: 09/03/2019 CLINICAL DATA:  Nonproductive cough.  Leukocytosis. EXAM: PORTABLE CHEST 1 VIEW COMPARISON:  August 31, 2019 FINDINGS: There appears to be a 1.6 cm  nodule in the right mid lung, in retrospect also seen on yesterday's study. No other nodules or masses. The right PICC line terminates in the central SVC. The right lung is otherwise clear. Mild haziness in the left base is probably mild atelectasis. No other changes. IMPRESSION: 1. Apparent 1.6 cm nodule in the right mid lung. Recommend CT imaging for further evaluation given history of newly diagnosed cancer. 2. The new right PICC line is in good position. 3. Mild atelectasis in the left base. Electronically Signed   By: Dorise Bullion III M.D   On: 09/03/2019 14:17        Scheduled Meds: . acetaminophen  650 mg Oral Q6H  . cephALEXin  500 mg Oral Q8H  . Chlorhexidine Gluconate Cloth  6 each Topical Daily  . clopidogrel  75 mg Oral Daily  . diclofenac Sodium  2 g Topical QID  . enoxaparin (LOVENOX) injection  40 mg Subcutaneous Q24H  . feeding supplement  1 Container Oral TID BM  . guaiFENesin  600 mg Oral BID  . insulin aspart  0-15 Units Subcutaneous Q6H  . lidocaine  1 patch Transdermal Q24H  . metoprolol tartrate  25 mg Oral BID  . nicotine  14 mg Transdermal Daily  . nystatin  5 mL Oral QID  . pantoprazole  40 mg Oral Daily  . sodium chloride flush  10-40 mL Intracatheter Q12H  . sodium chloride flush  3 mL Intravenous Q12H   Continuous Infusions:    LOS: 12 days    Time spent: 35 unit     Harjas Biggins A Latron Ribas, MD [facility]   If 7PM-7AM, please contact night-coverage [email or web address]  09/04/2019, 3:17 PM

## 2019-09-05 LAB — URINE CULTURE: Culture: >=100000 — AB

## 2019-09-05 LAB — GLUCOSE, CAPILLARY
Glucose-Capillary: 107 mg/dL — ABNORMAL HIGH (ref 70–99)
Glucose-Capillary: 94 mg/dL (ref 70–99)

## 2019-09-05 MED ORDER — ENSURE ENLIVE PO LIQD
237.0000 mL | Freq: Three times a day (TID) | ORAL | Status: DC
  Administered 2019-09-05: 13:00:00 237 mL via ORAL

## 2019-09-05 MED ORDER — NYSTATIN 100000 UNIT/ML MT SUSP: 5.0000 mL | Freq: Four times a day (QID) | OROMUCOSAL | 0 refills | Status: AC

## 2019-09-05 MED ORDER — LIDOCAINE 5 % EX PTCH: 1.0000 | MEDICATED_PATCH | CUTANEOUS | 0 refills | Status: AC

## 2019-09-05 MED ORDER — GUAIFENESIN ER 600 MG PO TB12: 600.0000 mg | ORAL_TABLET | Freq: Two times a day (BID) | ORAL | 0 refills | Status: AC

## 2019-09-05 MED ORDER — CEPHALEXIN 500 MG PO CAPS: 500.0000 mg | ORAL_CAPSULE | Freq: Three times a day (TID) | ORAL | 0 refills | Status: AC

## 2019-09-05 MED ORDER — METOPROLOL TARTRATE 25 MG PO TABS: 25.0000 mg | ORAL_TABLET | Freq: Two times a day (BID) | ORAL | 0 refills | Status: AC

## 2019-09-05 MED ORDER — ACETAMINOPHEN 325 MG PO TABS: 650.0000 mg | ORAL_TABLET | Freq: Four times a day (QID) | ORAL | Status: AC | PRN

## 2019-09-05 MED ORDER — NICOTINE 14 MG/24HR TD PT24: 14.0000 mg | MEDICATED_PATCH | Freq: Every day | TRANSDERMAL | 0 refills | Status: AC

## 2019-09-05 NOTE — Progress Notes (Signed)
Physical Therapy Treatment Patient Details Name: [name] MRN: [id number] DOB: [birth year] Today's Date: 09/05/2019    History of Present Illness 86 yo female admitted with large bowel obstruction. S/P ex lap colectomy, colostomy 4/20. Imaging (+) sigmoid mass. Hx of bil BKA, uterine ca, osteoporosis    PT Comments    Pt pleasant and cooperative however having difficulty problem solving donning of prostheses.  Unable to get prostheses to click into place despite multiple attempts to applied pt's shrinkers.  Pt performed scoot to drop arm recliner.   Follow Up Recommendations  SNF;Home health PT;Supervision/Assistance - 24 hour(if home will need assist)     Equipment Recommendations  None recommended by PT    Recommendations for Other Services       Precautions / Restrictions Precautions Precautions: Fall Precaution Comments: bil BKA-has prostheses in room, colostomy, abdominal incision    Mobility  Bed Mobility Overal bed mobility: Needs Assistance Bed Mobility: Supine to Sit     Supine to sit: Mod assist;HOB elevated     General bed mobility comments: cues for sequencing; assist for trunk upright  Transfers Overall transfer level: Needs assistance Equipment used: None Transfers: Lateral/Scoot Transfers          Lateral/Scoot Transfers: Min assist;From elevated surface General transfer comment: pt requiring cues for donning prosthesis in correct order (attempting to put on shrinkers first) however unable to click prosthetics into place despite multiple attempts and sock removal (?edema) so pt performed scoot transfer to drop arm recliner, slow but able to self assist, min assist for repositioning in recliner  Ambulation/Gait                 Stairs             Wheelchair Mobility    Modified Rankin (Stroke Patients Only)       Balance                                            Cognition Arousal/Alertness:  Awake/alert Behavior During Therapy: WFL for tasks assessed/performed Overall Cognitive Status: No family/caregiver present to determine baseline cognitive functioning                                 General Comments: difficulty problem solving donning prostheses, some mild difficulty remembering details      Exercises      General Comments        Pertinent Vitals/Pain Pain Assessment: Faces Faces Pain Scale: Hurts little more Pain Location: left 5th finger Pain Descriptors / Indicators: Sore;Grimacing Pain Intervention(s): Monitored during session;Repositioned(RN notified)    Home Living                      Prior Function            PT Goals (current goals can now be found in the care plan section) Progress towards PT goals: Progressing toward goals    Frequency    Min 3X/week      PT Plan Current plan remains appropriate    Co-evaluation              AM-PAC PT "6 Clicks" Mobility   Outcome Measure  Help needed turning from your back to your side while in a flat bed without using bedrails?: A Little  Help needed moving from lying on your back to sitting on the side of a flat bed without using bedrails?: A Lot Help needed moving to and from a bed to a chair (including a wheelchair)?: A Lot Help needed standing up from a chair using your arms (e.g., wheelchair or bedside chair)?: A Lot Help needed to walk in hospital room?: Total Help needed climbing 3-5 steps with a railing? : Total 6 Click Score: 11    End of Session   Activity Tolerance: Patient tolerated treatment well Patient left: in chair;with call bell/phone within reach;with chair alarm set Nurse Communication: Mobility status PT Visit Diagnosis: Muscle weakness (generalized) (M62.81);Difficulty in walking, not elsewhere classified (R26.2)     Time: [id number] PT Time Calculation (min) (ACUTE ONLY): 29 min  Charges:  $Therapeutic Activity: 23-37 mins                      Jannette Spanner PT, DPT Acute Rehabilitation Services Office: [phone]  Kadiatou Oplinger,[name] E 09/05/2019, 1:03 PM

## 2019-09-05 NOTE — Discharge Summary (Addendum)
Physician Discharge Summary  [name] [id number] DOB: Mar 18, 1932 DOA: 08/23/2019  PCP: [name], MD  Admit date: 08/23/2019 Discharge date: 09/05/2019  Admitted From: Home Disposition:  Home  Recommendations for Outpatient Follow-up:  1. Follow up with PCP in 1-2 weeks 2. Please obtain BMP/CBC in one week 3. Please follow up with SX post op 4. Follow up with Oncology, Dr [name].   Home Health: yes  Discharge Condition: Stable.  CODE STATUS: DNR Diet recommendation: Heart Healthy  Brief/Interim Summary: 86 year old with past medical history significant for PAD status post bilateral BKA.  She was admitted on 4/13 with large bowel obstruction.  CT showed sigmoid colon mass with distal colonic obstruction with metastasis to liver.  Surgery following and GI was consulted. Patient underwent flex sig 4/15 had a malignant partially obstructing tumor of the sigmoid colon, pathology with adenocarcinoma. -She was a started on TPN, hospital course complicated by delirium, patient underwent exploratory laparotomy, sigmoid colectomy with descending colostomy on 4/20.   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 -GI and general surgery consulted.  Patient was n.p.o., received IV fluids and supportive care. -Underwent flex sigmoidoscopy on 4/15 with malignant partially obstructing tumor in the sigmoid colon, biopsy, pathology showed adenocarcinoma. -General surgery following -Started on TPN on 4/17 -Underwent exploratory laparotomy and sigmoid colectomy with descending colostomy on 4/20. -Continue with soft diet .TPN was discontinue.  -Oncology consulted. Follow up outpatient.   2-Delirium, agitation from 417 until 4/18; Patient developed hospital delirium, in the setting of IV narcotics, worsened after overnight dose of Haldol for agitation. He is improved.  CT head unremarkable,  no evidence of infection, ABG without hypercapnia stable  3-Dehydration: Resolved with IV fluids and TPN 4-Cough: Started nebulizer treatment, guaifenesin, chest x ray negative for PNA.  Flutter valve. Improved.  5-history of PAD: Bilateral BKA.  Resume plavix. 6-Hypertension: Continue to hold lisinopril. 7-GERD: Continue with PPI 8-Oral thrush; started  Nystatin  9-SVT; start metoprolol. Check CBC  10-Leukocytosis; repeated chest x ray negative for PNA. Check UA. She has some redness flank area, start keflex cover for cellulitis. Trending down.  11-Cellulitis, flank area, near ostomy bag. Started keflex, improved. discharge on keflex.   Discharge Diagnoses:  Principal Problem:   Large bowel obstruction [facility]) Active Problems:   Essential hypertension   Abdominal pain   Dehydration   Acute prerenal azotemia   Metastatic colon cancer to liver [facility])    Discharge Instructions  Discharge Instructions    Diet - low sodium heart healthy   Complete by: As directed    Increase activity slowly   Complete by: As directed      Allergies as of 09/05/2019   No Known Allergies     Medication List    STOP taking these medications   lisinopril 20 MG tablet Commonly known as: ZESTRIL     TAKE these medications   acetaminophen 325 MG tablet Commonly known as: TYLENOL Take 2 tablets (650 mg total) by mouth every 6 (six) hours as needed for mild pain (or temp >/= 101 F). What changed:   how much to take  when to take this   cephALEXin 500 MG capsule Commonly known as: KEFLEX Take 1 capsule (500 mg total) by mouth every 8 (eight) hours for 5 days.   clopidogrel 75 MG tablet Commonly known as: PLAVIX Take 1 tablet (75 mg total) by mouth daily.   docusate sodium 100 MG  capsule Commonly known as: COLACE Take 100 mg by mouth 2 (two) times daily as needed for mild constipation or moderate constipation.   guaiFENesin 600 MG 12 hr tablet Commonly known as: MUCINEX Take 1  tablet (600 mg total) by mouth 2 (two) times daily.   ipratropium 0.03 % nasal spray Commonly known as: ATROVENT Place 2 sprays into both nostrils 3 (three) times daily. Use as needed for allergies and nasal discharge   lidocaine 5 % Commonly known as: LIDODERM Place 1 patch onto the skin daily. Remove & Discard patch within 12 hours or as directed by MD   metoprolol tartrate 25 MG tablet Commonly known as: LOPRESSOR Take 1 tablet (25 mg total) by mouth 2 (two) times daily.   nicotine 14 mg/24hr patch Commonly known as: NICODERM CQ - dosed in mg/24 hours Place 1 patch (14 mg total) onto the skin daily. Start taking on: September 06, 2019   nystatin 100000 UNIT/ML suspension Commonly known as: MYCOSTATIN Take 5 mLs (500,000 Units total) by mouth 4 (four) times daily.   omeprazole 20 MG capsule Commonly known as: PRILOSEC Take 20 mg by mouth daily.   SYSTANE FREE OP Place 1 drop into both eyes daily. For dry eyes      Follow-up Information    Jovita Kussmaul, MD Follow up.   Specialty: General Surgery Why: Our office is scheduling you for post-operative follow up. please call to confirm appointment date/time. Contact information: [place] [address] [phone]          No Known Allergies  Consultations:  Surgery  Oncology   Procedures/Studies: CT HEAD WO CONTRAST  Result Date: 08/28/2019 CLINICAL DATA:  Seizure. Abnormal neuro exam. Falls. EXAM: CT HEAD WITHOUT CONTRAST TECHNIQUE: Contiguous axial images were obtained from the base of the skull through the vertex without intravenous contrast. COMPARISON:  None. FINDINGS: Brain: Mild generalized atrophy and moderate diffuse white matter disease is present bilaterally. Basal ganglia are intact. Insular ribbon is normal bilaterally. No acute hemorrhage or mass lesion is present. The ventricles are proportionate to the degree of atrophy. No significant extraaxial fluid collection is present.  The brainstem and cerebellum are within normal limits. Vascular: Atherosclerotic changes are present within the cavernous internal carotid arteries bilaterally as well as at the dural margin of the left vertebral artery. No hyperdense vessel is present. Skull: Calvarium is intact. No focal lytic or blastic lesions are present. Sinuses/Orbits: The paranasal sinuses and mastoid air cells are clear. Bilateral lens replacements are noted. Globes and orbits are otherwise unremarkable. IMPRESSION: 1. Mild generalized atrophy and moderate diffuse white matter disease. This likely reflects the sequela of chronic microvascular ischemia. 2. No acute intracranial abnormality or significant interval change. Electronically Signed   By: San Morelle M.D.   On: 08/28/2019 15:17   CT Abdomen Pelvis W Contrast  Result Date: 08/23/2019 CLINICAL DATA:  Abdominal distension and constipation. Intermittent vomiting. Suspected bowel obstruction. EXAM: CT ABDOMEN AND PELVIS WITH CONTRAST TECHNIQUE: Multidetector CT imaging of the abdomen and pelvis was performed using the standard protocol following bolus administration of intravenous contrast. CONTRAST:  18mL OMNIPAQUE IOHEXOL 300 MG/ML  SOLN COMPARISON:  None. FINDINGS: Lower chest: Small dependent left pleural effusion with mild dependent left lower lobe atelectasis. The right lung base is clear. Atherosclerosis of the aorta and coronary arteries. There is a small hiatal hernia with mild distal esophageal wall thickening. Hepatobiliary: There is a large peripherally enhancing mass involving the right hepatic  lobe which measures 10.0 x 7.2 x 11.3 cm. There are additional smaller lesions in the right lobe measuring 14 mm on image 15/2 and 14 mm on image 32/2. No definite lesions in the left lobe. There are no underlying morphologic changes of cirrhosis. No evidence of gallstones, gallbladder wall thickening or biliary dilatation. Pancreas: Unremarkable. No pancreatic ductal  dilatation or surrounding inflammatory changes. Spleen: Normal in size without focal abnormality. Adrenals/Urinary Tract: Both adrenal glands appear normal. The kidneys appear normal without evidence of urinary tract calculus, suspicious lesion or hydronephrosis. No bladder abnormalities are seen. Stomach/Bowel: The stomach and proximal small bowel are decompressed. There are several mildly dilated and fluid-filled loops of mid small-bowel with long segment small bowel wall thickening and hyperenhancement in the right lower quadrant. The terminal ileum is decompressed. There is at least moderate diffuse distention of the colon with associated air-fluid levels. There is fairly abrupt change in caliber in the proximal sigmoid colon with suspicion of an infiltrating sigmoid colon mass (axial images 60 through 62 of series 2), suspicious for colon cancer. The rectosigmoid colon is decompressed. Vascular/Lymphatic: There are no enlarged abdominal or pelvic lymph nodes. Diffuse aortic and branch vessel atherosclerosis. No evidence of large vessel occlusion. The portal, superior mesenteric and splenic veins are patent. Reproductive: Hysterectomy. No adnexal mass. Other: Small amount of pelvic ascites. No definite peritoneal nodularity. No evidence of abdominal wall hernia. Musculoskeletal: No acute or significant osseous findings. There are degenerative changes throughout the lumbar spine associated with a convex left scoliosis. There is sclerosis of both femoral heads, not typical for AVN. No evidence of osseous metastatic disease. IMPRESSION: 1. Suspected infiltrating sigmoid colon mass, highly suspicious for colon cancer, with resulting distal colonic obstruction. 2. Large peripherally enhancing mass involving the right hepatic lobe with additional smaller lesions in the right lobe, most consistent with metastatic colon cancer. There are no morphologic changes of cirrhosis to suggest hepatocellular carcinoma. 3. Long  segment wall thickening and hyperenhancement in the distal small bowel, without focal mass lesion, possibly inflammatory. There is resulting mild proximal upstream small-bowel dilatation. 4. Small dependent left pleural effusion with mild dependent left lower lobe atelectasis. Small amount of ascites without definite peritoneal nodularity. 5. Aortic Atherosclerosis (ICD10-I70.0). Electronically Signed   By: Richardean Sale M.D.   On: 08/23/2019 16:45   DG CHEST PORT 1 VIEW  Result Date: 09/03/2019 CLINICAL DATA:  Nonproductive cough.  Leukocytosis. EXAM: PORTABLE CHEST 1 VIEW COMPARISON:  August 31, 2019 FINDINGS: There appears to be a 1.6 cm nodule in the right mid lung, in retrospect also seen on yesterday's study. No other nodules or masses. The right PICC line terminates in the central SVC. The right lung is otherwise clear. Mild haziness in the left base is probably mild atelectasis. No other changes. IMPRESSION: 1. Apparent 1.6 cm nodule in the right mid lung. Recommend CT imaging for further evaluation given history of newly diagnosed cancer. 2. The new right PICC line is in good position. 3. Mild atelectasis in the left base. Electronically Signed   By: Dorise Bullion III M.D   On: 09/03/2019 14:17   DG CHEST PORT 1 VIEW  Result Date: 08/31/2019 CLINICAL DATA:  Cough. New diagnosis of adenocarcinoma of the sigmoid colon. EXAM: PORTABLE CHEST 1 VIEW COMPARISON:  None. FINDINGS: Right-sided PICC line is difficult to follow centrally, likely terminating at the superior caval/atrial junction. Patient rotated left. Mild cardiomegaly. Atherosclerosis in the transverse aorta. No pleural effusion or pneumothorax. Biapical pleural thickening. Clear  lungs. IMPRESSION: No acute cardiopulmonary disease. Aortic Atherosclerosis (ICD10-I70.0). Electronically Signed   By: Abigail Miyamoto M.D.   On: 08/31/2019 14:09   DG INTRO LONG GI TUBE  Result Date: 08/25/2019 CLINICAL DATA:  Abdominal distension, nutritional  needs EXAM: FL FEEDING TUBE PLACEMENT FLUOROSCOPY TIME:  Fluoroscopy Time:  1 minutes, 12 seconds Radiation Exposure Index (if provided by the fluoroscopic device): 7.9 mGy Number of Acquired Spot Images: 0 COMPARISON:  None. FINDINGS: Local anesthetic was applied to the right nostril. A nasogastric tube was advanced without difficulty from the right nostril into the stomach under fluoroscopic observation. The tube was taped in place using a dedicated nasal tape apparatus with clamp. The nasogastric tube is ready to use. IMPRESSION: 1. Successful fluoroscopically guided nasogastric tube placement. Electronically Signed   By: Van Clines M.D.   On: 08/25/2019 08:32   ECHOCARDIOGRAM COMPLETE  Result Date: 08/24/2019    ECHOCARDIOGRAM REPORT   Patient Name:   [name] Date of Exam: 08/24/2019 Medical Rec #:  [id number]          Height:       67.0 in Accession #:    [id number]         Weight:       130.0 lb Date of Birth:  02/13/1932          BSA:          1.684 m Patient Age:    69 years           BP:           127/81 mmHg Patient Gender: F                  HR:           90 bpm. Exam Location:  Inpatient Procedure: 2D Echo Indications:    Murmur 785.2 / R01.1  History:        Patient has no prior history of Echocardiogram examinations.                 PAD, Arrythmias:LBBB; Risk Factors:Hypertension. Double BKA,                 uterine cancer.  Sonographer:    Darlina Sicilian RDCS Referring Phys: [id number] [employer]  1. Left ventricular ejection fraction, by estimation, is 60 to 65%. The left ventricle has normal function. The left ventricle has no regional wall motion abnormalities. Left ventricular diastolic parameters are consistent with Grade I diastolic dysfunction (impaired relaxation).  2. Right ventricular systolic function is normal. The right ventricular size is normal.  3. The mitral valve is normal in structure. No evidence of mitral valve regurgitation. No evidence of  mitral stenosis.  4. The aortic valve is normal in structure. Aortic valve regurgitation is mild. No aortic stenosis is present. FINDINGS  Left Ventricle: Left ventricular ejection fraction, by estimation, is 60 to 65%. The left ventricle has normal function. The left ventricle has no regional wall motion abnormalities. The left ventricular internal cavity size was normal in size. There is  no left ventricular hypertrophy. Left ventricular diastolic parameters are consistent with Grade I diastolic dysfunction (impaired relaxation). Right Ventricle: The right ventricular size is normal. No increase in right ventricular wall thickness. Right ventricular systolic function is normal. Left Atrium: Left atrial size was normal in size. Right Atrium: Right atrial size was normal in size. Pericardium: There is no evidence of pericardial effusion. Mitral Valve: The mitral valve is  normal in structure. No evidence of mitral valve regurgitation. No evidence of mitral valve stenosis. Tricuspid Valve: The tricuspid valve is normal in structure. Tricuspid valve regurgitation is trivial. Aortic Valve: The aortic valve is normal in structure. Aortic valve regurgitation is mild. Aortic regurgitation PHT measures 510 msec. No aortic stenosis is present. Mild aortic valve annular calcification. Pulmonic Valve: The pulmonic valve was normal in structure. Pulmonic valve regurgitation is not visualized. No evidence of pulmonic stenosis. Aorta: The aortic root and ascending aorta are structurally normal, with no evidence of dilitation. IAS/Shunts: The atrial septum is grossly normal.  LEFT VENTRICLE PLAX 2D LVIDd:         4.00 cm     Diastology LVIDs:         3.10 cm     LV e' lateral:   6.42 cm/s LV PW:         1.10 cm     LV E/e' lateral: 9.7 LV IVS:        1.40 cm     LV e' medial:    6.74 cm/s LVOT diam:     1.80 cm     LV E/e' medial:  9.2 LV SV:         74 LV SV Index:   44 LVOT Area:     2.54 cm  LV Volumes (MOD) LV vol d, MOD A2C:  61.5 ml LV vol d, MOD A4C: 81.7 ml LV vol s, MOD A2C: 44.1 ml LV vol s, MOD A4C: 50.0 ml LV SV MOD A2C:     17.4 ml LV SV MOD A4C:     81.7 ml LV SV MOD BP:      22.9 ml RIGHT VENTRICLE RV S prime:     15.90 cm/s TAPSE (M-mode): 1.3 cm LEFT ATRIUM             Index       RIGHT ATRIUM           Index LA diam:        3.60 cm 2.14 cm/m  RA Area:     11.70 cm LA Vol (A2C):   42.6 ml 25.30 ml/m RA Volume:   24.50 ml  14.55 ml/m LA Vol (A4C):   33.9 ml 20.13 ml/m LA Biplane Vol: 39.7 ml 23.58 ml/m  AORTIC VALVE LVOT Vmax:   197.00 cm/s LVOT Vmean:  111.000 cm/s LVOT VTI:    0.290 m AI PHT:      510 msec  AORTA Ao Root diam: 3.00 cm Ao Asc diam:  3.50 cm MITRAL VALVE                TRICUSPID VALVE MV Area (PHT): 2.82 cm     TR Peak grad:   26.0 mmHg MV Decel Time: 269 msec     TR Vmax:        255.00 cm/s MV E velocity: 62.10 cm/s MV A velocity: 113.00 cm/s  SHUNTS MV E/A ratio:  0.55         Systemic VTI:  0.29 m                             Systemic Diam: 1.80 cm Mertie Moores MD Electronically signed by Mertie Moores MD Signature Date/Time: 08/24/2019/3:26:02 PM    Final    [place] EKG SITE RITE  Result Date: 08/26/2019 If Site Rite image not attached, placement could not be confirmed due to current  cardiac rhythm.     Subjective: Feels well, no significant pain  Discharge Exam: Vitals:   09/05/19 0152 09/05/19 0543  BP: 116/69 (!) 148/73  Pulse: 70 84  Resp: 18 20  Temp: (!) 97.5 F (36.4 C) (!) 97.5 F (36.4 C)  SpO2: 95% 95%     General: Pt is alert, awake, not in acute distress Cardiovascular: RRR, S1/S2 +, no rubs, no gallops Respiratory: CTA bilaterally, no wheezing, no rhonchi Abdominal: Soft, NT, ND, bowel sounds + incision healing, colostomy with bag and stool, redness flank area improved Extremities: no edema, no cyanosis    The results of significant diagnostics from this hospitalization (including imaging, microbiology, ancillary and laboratory) are listed below for reference.      Microbiology: Recent Results (from the past 240 hour(s))  Culture, Urine     Status: None   Collection Time: 08/28/19 12:25 PM   Specimen: Urine, Random  Result Value Ref Range Status   Specimen Description   Final    URINE, RANDOM Performed at [facility] [address], [place], [place] [address]    Special Requests   Final    NONE Performed at [facility], [place] [address] [place], [place] [address]    Culture   Final    NO GROWTH Performed at [facility], [place] [address] [place], [place] [address]    Report Status 08/29/2019 FINAL  Final  Culture, Urine     Status: Abnormal   Collection Time: 09/03/19  5:26 PM   Specimen: Urine, Random  Result Value Ref Range Status   Specimen Description   Final    URINE, RANDOM Performed at [facility] [address], [place], [place] [address]    Special Requests   Final    keflex Performed at [facility] [address] [place], [name] [address]    Culture >=100,000 COLONIES/mL ESCHERICHIA COLI (A)  Final   Report Status 09/05/2019 FINAL  Final   Organism ID, Bacteria ESCHERICHIA COLI (A)  Final      Susceptibility   Escherichia coli - MIC*    AMPICILLIN 8 SENSITIVE Sensitive     CEFAZOLIN <=4 SENSITIVE Sensitive     CEFTRIAXONE <=1 SENSITIVE Sensitive     CIPROFLOXACIN <=0.25 SENSITIVE Sensitive     GENTAMICIN <=1 SENSITIVE Sensitive     IMIPENEM <=0.25 SENSITIVE Sensitive     NITROFURANTOIN <=16 SENSITIVE Sensitive     TRIMETH/SULFA <=20 SENSITIVE Sensitive     AMPICILLIN/SULBACTAM 4 SENSITIVE Sensitive     PIP/TAZO <=4 SENSITIVE Sensitive     * >=100,000 COLONIES/mL ESCHERICHIA COLI     Labs: BNP (last 3 results) No results for input(s): BNP in the last 8760 hours. Basic Metabolic Panel: Recent Labs  Lab 08/30/19 0445 08/30/19 0445 08/31/19 0348 09/01/19 0357 09/02/19 0350 09/03/19 0345 09/04/19 0315  NA 134*   < >  135 132* 133* 136 142  K 4.4   < > 4.3 4.9 4.1 3.5 3.5  CL 104   < > 103 103 104 105 111  CO2 24   < > 22 22 22 23 25   GLUCOSE 122*   < > 195* 128* 120* 122* 71  BUN 16   < > 17 17 14 14 16   CREATININE <0.30*   < > 0.47 <0.30* 0.35* 0.30* 0.37*  CALCIUM 8.2*   < > 8.3* 8.3* 7.9* 7.9* 7.9*  MG 2.0  --  2.0 1.9  2.0 2.0  --   PHOS 3.9  --  3.5 2.7 4.1 3.9  --    < > = values in this interval not displayed.   Liver Function Tests: Recent Labs  Lab 09/01/19 0357  AST 20  ALT 13  ALKPHOS 64  BILITOT 0.4  PROT 4.7*  ALBUMIN 2.0*   No results for input(s): LIPASE, AMYLASE in the last 168 hours. No results for input(s): AMMONIA in the last 168 hours. CBC: Recent Labs  Lab 08/31/19 0348 09/02/19 1907 09/03/19 0830 09/04/19 0315  WBC 10.7* 14.6* 15.2* 10.8*  HGB 11.6* 9.4* 9.8* 9.1*  HCT 35.5* 28.7* 29.9* 28.0*  MCV 93.2 93.2 92.9 92.1  PLT 279 257 286 284   Cardiac Enzymes: No results for input(s): CKTOTAL, CKMB, CKMBINDEX, TROPONINI in the last 168 hours. BNP: Invalid input(s): POCBNP CBG: Recent Labs  Lab 09/04/19 0000 09/04/19 1219 09/04/19 1755 09/05/19 0000 09/05/19 0550  GLUCAP 127* 144* 131* 107* 94   D-Dimer No results for input(s): DDIMER in the last 72 hours. Hgb A1c No results for input(s): HGBA1C in the last 72 hours. Lipid Profile No results for input(s): CHOL, HDL, LDLCALC, TRIG, CHOLHDL, LDLDIRECT in the last 72 hours. Thyroid function studies No results for input(s): TSH, T4TOTAL, T3FREE, THYROIDAB in the last 72 hours.  Invalid input(s): FREET3 Anemia work up No results for input(s): VITAMINB12, FOLATE, FERRITIN, TIBC, IRON, RETICCTPCT in the last 72 hours. Urinalysis    Component Value Date/Time   COLORURINE AMBER (A) 09/03/2019 1116   APPEARANCEUR HAZY (A) 09/03/2019 1116   LABSPEC 1.024 09/03/2019 1116   PHURINE 5.0 09/03/2019 1116   GLUCOSEU NEGATIVE 09/03/2019 1116   [place] 09/03/2019 1116   [place] 09/03/2019  1116   [place] 09/03/2019 1116   PROTEINUR NEGATIVE 09/03/2019 1116   NITRITE NEGATIVE 09/03/2019 1116   LEUKOCYTESUR LARGE (A) 09/03/2019 1116   Sepsis Labs Invalid input(s): PROCALCITONIN,  WBC,  LACTICIDVEN Microbiology Recent Results (from the past 240 hour(s))  Culture, Urine     Status: None   Collection Time: 08/28/19 12:25 PM   Specimen: Urine, Random  Result Value Ref Range Status   Specimen Description   Final    URINE, RANDOM Performed at [facility], [place] [address], [place], [place] [address]    Special Requests   Final    NONE Performed at [facility], [place] [address], [place], [place] [address]    Culture   Final    NO GROWTH Performed at [facility], [place] [address], [place], [place] [address]    Report Status 08/29/2019 FINAL  Final  Culture, Urine     Status: Abnormal   Collection Time: 09/03/19  5:26 PM   Specimen: Urine, Random  Result Value Ref Range Status   Specimen Description   Final    URINE, RANDOM Performed at [facility] [address], [place], [place] [address]    Special Requests   Final    keflex Performed at [facility] [address], [place], [place] [address]    Culture >=100,000 COLONIES/mL ESCHERICHIA COLI (A)  Final   Report Status 09/05/2019 FINAL  Final   Organism ID, Bacteria ESCHERICHIA COLI (A)  Final      Susceptibility   Escherichia coli - MIC*    AMPICILLIN 8 SENSITIVE Sensitive     CEFAZOLIN <=4 SENSITIVE Sensitive     CEFTRIAXONE <=1 SENSITIVE Sensitive  CIPROFLOXACIN <=0.25 SENSITIVE Sensitive     GENTAMICIN <=1 SENSITIVE Sensitive     IMIPENEM <=0.25 SENSITIVE Sensitive     NITROFURANTOIN <=16 SENSITIVE Sensitive     TRIMETH/SULFA <=20 SENSITIVE Sensitive     AMPICILLIN/SULBACTAM 4 SENSITIVE Sensitive     PIP/TAZO <=4 SENSITIVE Sensitive     * >=100,000 COLONIES/mL ESCHERICHIA COLI     Time coordinating  discharge: 40 minutes  SIGNED:   Elmarie Shiley, MD  [facility]

## 2019-09-05 NOTE — Progress Notes (Addendum)
[facility] Progress Note  6 Days Post-Op  Subjective: CC:  Denies pain. Tolerating PO. States she has walked using her prosthetic. Having bowel function via end colostomy.   AFVSS Objective: Vital signs in last 24 hours: Temp:  [97.5 F (36.4 C)-98.7 F (37.1 C)] 97.5 F (36.4 C) (04/26 0543) Pulse Rate:  [70-91] 84 (04/26 0543) Resp:  [17-20] 20 (04/26 0543) BP: (116-148)/(63-103) 148/73 (04/26 0543) SpO2:  [95 %-100 %] 95 % (04/26 0543) Weight:  [58.6 kg] 58.6 kg (04/26 0543) Last BM Date: 09/02/19  Intake/Output from previous day: 04/25 0701 - 04/26 0700 In: 240 [P.O.:240] Out: 100 [Stool:100] Intake/Output this shift: No intake/output data recorded.  PE: Gen:  Alert, NAD, pleasant HEENT: hoarse voice  Card:  Regular rate and rhythm, pedal pulses 2+ BL Pulm:  Normal effort, clear to auscultation bilaterally Abd: soft, overall non-tender, midline incision closed with staples - there is ~0.5-1.0 cm blanching erythema and mild induration inferior aspect of incision - mild erythema central aspect as well, LLQ stoma pink and viable with gas and non-bloody stool in pouch Skin: warm and dry, left flank with blanching erythema, no induration Psych: A&Ox3   Lab Results:  Recent Labs    09/03/19 0830 09/04/19 0315  WBC 15.2* 10.8*  HGB 9.8* 9.1*  HCT 29.9* 28.0*  PLT 286 284   BMET Recent Labs    09/03/19 0345 09/04/19 0315  NA 136 142  K 3.5 3.5  CL 105 111  CO2 23 25  GLUCOSE 122* 71  BUN 14 16  CREATININE 0.30* 0.37*  CALCIUM 7.9* 7.9*   PT/INR No results for input(s): LABPROT, INR in the last 72 hours. CMP     Component Value Date/Time   NA 142 09/04/2019 0315   K 3.5 09/04/2019 0315   CL 111 09/04/2019 0315   CO2 25 09/04/2019 0315   GLUCOSE 71 09/04/2019 0315   BUN 16 09/04/2019 0315   CREATININE 0.37 (L) 09/04/2019 0315   CREATININE 0.44 (L) 04/14/2017 0859   CALCIUM 7.9 (L) 09/04/2019 0315   PROT 4.7 (L) 09/01/2019 0357    ALBUMIN 2.0 (L) 09/01/2019 0357   AST 20 09/01/2019 0357   ALT 13 09/01/2019 0357   ALKPHOS 64 09/01/2019 0357   BILITOT 0.4 09/01/2019 0357   GFRNONAA >60 09/04/2019 0315   GFRAA >60 09/04/2019 0315   Lipase     Component Value Date/Time   LIPASE 18 08/23/2019 1404       Studies/Results: DG CHEST PORT 1 VIEW  Result Date: 09/03/2019 CLINICAL DATA:  Nonproductive cough.  Leukocytosis. EXAM: PORTABLE CHEST 1 VIEW COMPARISON:  August 31, 2019 FINDINGS: There appears to be a 1.6 cm nodule in the right mid lung, in retrospect also seen on yesterday's study. No other nodules or masses. The right PICC line terminates in the central SVC. The right lung is otherwise clear. Mild haziness in the left base is probably mild atelectasis. No other changes. IMPRESSION: 1. Apparent 1.6 cm nodule in the right mid lung. Recommend CT imaging for further evaluation given history of newly diagnosed cancer. 2. The new right PICC line is in good position. 3. Mild atelectasis in the left base. Electronically Signed   By: Dorise Bullion III M.D   On: 09/03/2019 14:17    Anti-infectives: Anti-infectives (From admission, onward)   Start     Dose/Rate Route Frequency Ordered Stop   09/03/19 1400  cephALEXin (KEFLEX) capsule 500 mg     500 mg Oral  Every 8 hours 09/03/19 1118 09/08/19 1359   08/30/19 1144  sodium chloride 0.9 % with cefoTEtan (CEFOTAN) ADS Med  Status:  Discontinued    Note to Pharmacy: Georgena Spurling   : cabinet override      08/30/19 1144 08/30/19 1247   08/30/19 0600  cefoTEtan (CEFOTAN) 2 g in sodium chloride 0.9 % 100 mL IVPB     2 g 200 mL/hr over 30 Minutes Intravenous On call to O.R. 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 Marlou Starks  continue soft diet, add ensure TID Scheduled tylenol for pain (increase to 650 mg), PRN ibuprofen, lidoderm patch  PT/OT  WOC RN following for ostomy care - ostomy teaching with patients grandson, byron, performed 4/23  ?develping midline wound infection, already on Keflex. Will follow closely. I do not think we need to remove staples today and transition to wet-to-dry dressing changes.  FEN:  SOFT ID: perioperative ancef 4/20 x1 dose, Keflex 4/24 >> VTE: SCD's, Lovenox Dispo: home with [facility] RN/PT/OT   Our office is scheduling her for staple removal and follow up with Dr. Marlou Starks.    LOS: 13 days    Obie Dredge, [facility] Pager: [phone]

## 2019-09-06 ENCOUNTER — Telehealth: Payer: Self-pay | Admitting: Family Medicine

## 2019-09-06 LAB — SURGICAL PATHOLOGY

## 2019-09-06 NOTE — Telephone Encounter (Signed)
Called and spoke with her grandson [name].  Cartney is back home, she is comfortable.  The ostomy nurse should be coming out tomorrow.  I asked parent to please get her phone number so I can contact the nurse, and make sure that I have all supplies and everything else that she needs.

## 2019-09-07 ENCOUNTER — Telehealth: Payer: Self-pay | Admitting: *Deleted

## 2019-09-07 NOTE — Telephone Encounter (Signed)
Spoke w/ pt's grandson [name]. States they are currently headed to surgeon's office for follow up and that he will call back to schedule appt as needed.

## 2019-09-08 DIAGNOSIS — K219 Gastro-esophageal reflux disease without esophagitis: Secondary | ICD-10-CM | POA: Diagnosis not present

## 2019-09-08 DIAGNOSIS — Z89512 Acquired absence of left leg below knee: Secondary | ICD-10-CM | POA: Diagnosis not present

## 2019-09-08 DIAGNOSIS — Z7902 Long term (current) use of antithrombotics/antiplatelets: Secondary | ICD-10-CM | POA: Diagnosis not present

## 2019-09-08 DIAGNOSIS — C187 Malignant neoplasm of sigmoid colon: Secondary | ICD-10-CM | POA: Diagnosis not present

## 2019-09-08 DIAGNOSIS — I10 Essential (primary) hypertension: Secondary | ICD-10-CM | POA: Diagnosis not present

## 2019-09-08 DIAGNOSIS — Z433 Encounter for attention to colostomy: Secondary | ICD-10-CM | POA: Diagnosis not present

## 2019-09-08 DIAGNOSIS — F05 Delirium due to known physiological condition: Secondary | ICD-10-CM | POA: Diagnosis not present

## 2019-09-08 DIAGNOSIS — Z89511 Acquired absence of right leg below knee: Secondary | ICD-10-CM | POA: Diagnosis not present

## 2019-09-08 DIAGNOSIS — C787 Secondary malignant neoplasm of liver and intrahepatic bile duct: Secondary | ICD-10-CM | POA: Diagnosis not present

## 2019-09-08 DIAGNOSIS — Z9181 History of falling: Secondary | ICD-10-CM | POA: Diagnosis not present

## 2019-09-08 DIAGNOSIS — I739 Peripheral vascular disease, unspecified: Secondary | ICD-10-CM | POA: Diagnosis not present

## 2019-09-08 DIAGNOSIS — Z483 Aftercare following surgery for neoplasm: Secondary | ICD-10-CM | POA: Diagnosis not present

## 2019-09-09 ENCOUNTER — Telehealth: Payer: Self-pay

## 2019-09-09 ENCOUNTER — Encounter (HOSPITAL_COMMUNITY): Payer: Self-pay | Admitting: General Surgery

## 2019-09-09 DIAGNOSIS — Z433 Encounter for attention to colostomy: Secondary | ICD-10-CM | POA: Diagnosis not present

## 2019-09-09 DIAGNOSIS — I739 Peripheral vascular disease, unspecified: Secondary | ICD-10-CM | POA: Diagnosis not present

## 2019-09-09 DIAGNOSIS — Z483 Aftercare following surgery for neoplasm: Secondary | ICD-10-CM | POA: Diagnosis not present

## 2019-09-09 DIAGNOSIS — C787 Secondary malignant neoplasm of liver and intrahepatic bile duct: Secondary | ICD-10-CM | POA: Diagnosis not present

## 2019-09-09 DIAGNOSIS — I10 Essential (primary) hypertension: Secondary | ICD-10-CM | POA: Diagnosis not present

## 2019-09-09 DIAGNOSIS — C187 Malignant neoplasm of sigmoid colon: Secondary | ICD-10-CM | POA: Diagnosis not present

## 2019-09-09 NOTE — Telephone Encounter (Signed)
Patients Physical Therapist  Tillie Rung called in to get verbal orders for  Physical therapy 1 time a week for 4 weeks please call Tillie Rung at  [facility] to leave a message.

## 2019-09-10 ENCOUNTER — Encounter: Payer: Self-pay | Admitting: Family Medicine

## 2019-09-12 NOTE — Telephone Encounter (Signed)
Left message for verbal orders. 

## 2019-09-14 DIAGNOSIS — C787 Secondary malignant neoplasm of liver and intrahepatic bile duct: Secondary | ICD-10-CM | POA: Diagnosis not present

## 2019-09-14 DIAGNOSIS — Z433 Encounter for attention to colostomy: Secondary | ICD-10-CM | POA: Diagnosis not present

## 2019-09-14 DIAGNOSIS — Z483 Aftercare following surgery for neoplasm: Secondary | ICD-10-CM | POA: Diagnosis not present

## 2019-09-14 DIAGNOSIS — I10 Essential (primary) hypertension: Secondary | ICD-10-CM | POA: Diagnosis not present

## 2019-09-14 DIAGNOSIS — I739 Peripheral vascular disease, unspecified: Secondary | ICD-10-CM | POA: Diagnosis not present

## 2019-09-14 DIAGNOSIS — C187 Malignant neoplasm of sigmoid colon: Secondary | ICD-10-CM | POA: Diagnosis not present

## 2019-09-16 DIAGNOSIS — Z483 Aftercare following surgery for neoplasm: Secondary | ICD-10-CM | POA: Diagnosis not present

## 2019-09-16 DIAGNOSIS — C787 Secondary malignant neoplasm of liver and intrahepatic bile duct: Secondary | ICD-10-CM | POA: Diagnosis not present

## 2019-09-16 DIAGNOSIS — I10 Essential (primary) hypertension: Secondary | ICD-10-CM | POA: Diagnosis not present

## 2019-09-16 DIAGNOSIS — Z433 Encounter for attention to colostomy: Secondary | ICD-10-CM | POA: Diagnosis not present

## 2019-09-16 DIAGNOSIS — C187 Malignant neoplasm of sigmoid colon: Secondary | ICD-10-CM | POA: Diagnosis not present

## 2019-09-16 DIAGNOSIS — I739 Peripheral vascular disease, unspecified: Secondary | ICD-10-CM | POA: Diagnosis not present

## 2019-09-19 ENCOUNTER — Inpatient Hospital Stay: Payer: Medicare Other

## 2019-09-19 ENCOUNTER — Telehealth: Payer: Self-pay | Admitting: Family Medicine

## 2019-09-19 ENCOUNTER — Inpatient Hospital Stay: Payer: Medicare Other | Admitting: Hematology

## 2019-09-19 NOTE — Telephone Encounter (Signed)
Wheel chair order requested by [facility] but signed by Copland . Juliann Pulse stats  [employer] note stating why wheel chair needed, And why a Cane or walker wont work. Please Advise

## 2019-09-20 NOTE — Telephone Encounter (Signed)
Called and got update- they need OV notes faxed including statement of why a cane or walker will not suffice for this pt   Print note and will fax  [facility] Attn Juliann Pulse

## 2019-09-21 DIAGNOSIS — I739 Peripheral vascular disease, unspecified: Secondary | ICD-10-CM | POA: Diagnosis not present

## 2019-09-21 DIAGNOSIS — C187 Malignant neoplasm of sigmoid colon: Secondary | ICD-10-CM | POA: Diagnosis not present

## 2019-09-21 DIAGNOSIS — I10 Essential (primary) hypertension: Secondary | ICD-10-CM | POA: Diagnosis not present

## 2019-09-21 DIAGNOSIS — C787 Secondary malignant neoplasm of liver and intrahepatic bile duct: Secondary | ICD-10-CM | POA: Diagnosis not present

## 2019-09-21 DIAGNOSIS — Z433 Encounter for attention to colostomy: Secondary | ICD-10-CM | POA: Diagnosis not present

## 2019-09-21 DIAGNOSIS — Z483 Aftercare following surgery for neoplasm: Secondary | ICD-10-CM | POA: Diagnosis not present

## 2019-09-22 DIAGNOSIS — I739 Peripheral vascular disease, unspecified: Secondary | ICD-10-CM | POA: Diagnosis not present

## 2019-09-22 DIAGNOSIS — C787 Secondary malignant neoplasm of liver and intrahepatic bile duct: Secondary | ICD-10-CM | POA: Diagnosis not present

## 2019-09-22 DIAGNOSIS — I10 Essential (primary) hypertension: Secondary | ICD-10-CM | POA: Diagnosis not present

## 2019-09-22 DIAGNOSIS — C187 Malignant neoplasm of sigmoid colon: Secondary | ICD-10-CM | POA: Diagnosis not present

## 2019-09-22 DIAGNOSIS — Z433 Encounter for attention to colostomy: Secondary | ICD-10-CM | POA: Diagnosis not present

## 2019-09-22 DIAGNOSIS — Z483 Aftercare following surgery for neoplasm: Secondary | ICD-10-CM | POA: Diagnosis not present

## 2019-09-23 ENCOUNTER — Other Ambulatory Visit: Payer: Self-pay

## 2019-09-23 ENCOUNTER — Emergency Department (HOSPITAL_COMMUNITY): Payer: Medicare Other

## 2019-09-23 ENCOUNTER — Encounter: Payer: Self-pay | Admitting: Family Medicine

## 2019-09-23 ENCOUNTER — Emergency Department (HOSPITAL_COMMUNITY)
Admission: EM | Admit: 2019-09-23 | Discharge: 2019-09-24 | Disposition: A | Discharge status: Home / Self Care | Payer: Medicare Other | Attending: Emergency Medicine | Admitting: Emergency Medicine

## 2019-09-23 ENCOUNTER — Encounter (HOSPITAL_COMMUNITY): Payer: Self-pay

## 2019-09-23 DIAGNOSIS — Z79899 Other long term (current) drug therapy: Secondary | ICD-10-CM | POA: Insufficient documentation

## 2019-09-23 DIAGNOSIS — Z743 Need for continuous supervision: Secondary | ICD-10-CM | POA: Diagnosis not present

## 2019-09-23 DIAGNOSIS — R4182 Altered mental status, unspecified: Secondary | ICD-10-CM | POA: Diagnosis present

## 2019-09-23 DIAGNOSIS — E876 Hypokalemia: Secondary | ICD-10-CM | POA: Diagnosis not present

## 2019-09-23 DIAGNOSIS — I10 Essential (primary) hypertension: Secondary | ICD-10-CM | POA: Diagnosis not present

## 2019-09-23 DIAGNOSIS — R410 Disorientation, unspecified: Secondary | ICD-10-CM | POA: Diagnosis not present

## 2019-09-23 DIAGNOSIS — R0902 Hypoxemia: Secondary | ICD-10-CM | POA: Diagnosis not present

## 2019-09-23 DIAGNOSIS — J90 Pleural effusion, not elsewhere classified: Secondary | ICD-10-CM | POA: Diagnosis not present

## 2019-09-23 LAB — COMPREHENSIVE METABOLIC PANEL
ALT: 17 U/L (ref 0–44)
AST: 25 U/L (ref 15–41)
Albumin: 2.4 g/dL — ABNORMAL LOW (ref 3.5–5.0)
Alkaline Phosphatase: 154 U/L — ABNORMAL HIGH (ref 38–126)
Anion gap: 8 (ref 5–15)
BUN: 8 mg/dL (ref 8–23)
CO2: 25 mmol/L (ref 22–32)
Calcium: 8 mg/dL — ABNORMAL LOW (ref 8.9–10.3)
Chloride: 104 mmol/L (ref 98–111)
Creatinine, Ser: 0.45 mg/dL (ref 0.44–1.00)
GFR calc Af Amer: >60 mL/min (ref >60)
GFR calc non Af Amer: >60 mL/min (ref >60)
Glucose, Bld: 137 mg/dL — ABNORMAL HIGH (ref 70–99)
Potassium: 3 mmol/L — ABNORMAL LOW (ref 3.5–5.1)
Sodium: 137 mmol/L (ref 135–145)
Total Bilirubin: 0.2 mg/dL — ABNORMAL LOW (ref 0.3–1.2)
Total Protein: 5.8 g/dL — ABNORMAL LOW (ref 6.5–8.1)

## 2019-09-23 LAB — CBC WITH DIFFERENTIAL/PLATELET
Abs Immature Granulocytes: 0.04 10*3/uL (ref 0.00–0.07)
Basophils Absolute: 0 10*3/uL (ref 0.0–0.1)
Basophils Relative: 0 %
Eosinophils Absolute: 0.1 10*3/uL (ref 0.0–0.5)
Eosinophils Relative: 1 %
HCT: 29.3 % — ABNORMAL LOW (ref 36.0–46.0)
Hemoglobin: 9.2 g/dL — ABNORMAL LOW (ref 12.0–15.0)
Immature Granulocytes: 0 %
Lymphocytes Relative: 12 %
Lymphs Abs: 1.4 10*3/uL (ref 0.7–4.0)
MCH: 29.1 pg (ref 26.0–34.0)
MCHC: 31.4 g/dL (ref 30.0–36.0)
MCV: 92.7 fL (ref 80.0–100.0)
Monocytes Absolute: 1.2 10*3/uL — ABNORMAL HIGH (ref 0.1–1.0)
Monocytes Relative: 11 %
Neutro Abs: 8.6 10*3/uL — ABNORMAL HIGH (ref 1.7–7.7)
Neutrophils Relative %: 76 %
Platelets: 452 10*3/uL — ABNORMAL HIGH (ref 150–400)
RBC: 3.16 MIL/uL — ABNORMAL LOW (ref 3.87–5.11)
RDW: 14.6 % (ref 11.5–15.5)
WBC: 11.4 10*3/uL — ABNORMAL HIGH (ref 4.0–10.5)
nRBC: 0 % (ref 0.0–0.2)

## 2019-09-23 LAB — URINALYSIS, ROUTINE W REFLEX MICROSCOPIC
Bilirubin Urine: NEGATIVE
Glucose, UA: NEGATIVE mg/dL
Hgb urine dipstick: NEGATIVE
Ketones, ur: NEGATIVE mg/dL
Leukocytes,Ua: NEGATIVE
Nitrite: NEGATIVE
Protein, ur: NEGATIVE mg/dL
Specific Gravity, Urine: 1.016 (ref 1.005–1.030)
pH: 7 (ref 5.0–8.0)

## 2019-09-23 LAB — MAGNESIUM: Magnesium: 1.8 mg/dL (ref 1.7–2.4)

## 2019-09-23 MED ORDER — POTASSIUM CHLORIDE CRYS ER 20 MEQ PO TBCR
60.0000 meq | EXTENDED_RELEASE_TABLET | Freq: Once | ORAL | Status: AC
  Administered 2019-09-23: 60 meq via ORAL
  Filled 2019-09-23: qty 3

## 2019-09-23 MED ORDER — POTASSIUM CHLORIDE ER 10 MEQ PO TBCR: 30.0000 meq | EXTENDED_RELEASE_TABLET | Freq: Every day | ORAL | 0 refills | Status: AC

## 2019-09-23 NOTE — ED Triage Notes (Signed)
Per EMS,  Pt is coming from home. Pt was switched from lisinopril to  Metoprolol. Pt now has intermittent delirium. Family states that she is confused in the morning. Wanted to be evaluated, concern UTI. Pt is a bilateral BKA, and has a colostomy bag. Family also stated there is a new bump that has formed on the leg of the right amputation.

## 2019-09-23 NOTE — ED Provider Notes (Signed)
[facility] DEPT Provider Note   CSN: [id number] Arrival date & time: 09/23/19  [id number]     History Chief Complaint  Patient presents with  . Altered Mental Status    [name] is a 86 y.o. female with a past medical history of HTN, PAD, s/p bilateral BKA, admission for bowel obstruction caused by colonic mass last month presenting to the ED for altered mental status. Patient states she is here because "they thought I was getting a little confused from time to time." She was concerned that it was because of her recent switch from lisinopril to metoprolol.  States that she overall feels fine, denies any chest pain, abdominal pain, headache, injuries or falls, numbness in arms or legs.  HPI     Past Medical History:  Diagnosis Date  . Arthritis   . Cancer ([place])    utertrine  . Hypertension   . Osteoporosis 30-Apr-2017  . PAD (peripheral artery disease) [facility])     Patient Active Problem List   Diagnosis Date Noted  . Metastatic colon cancer to liver ([place])   . Abdominal pain 08/24/2019  . Dehydration 08/24/2019  . Acute prerenal azotemia 08/24/2019  . Large bowel obstruction ([place]) 08/23/2019  . Ischemia of right lower extremity 06/19/2017  . Malnutrition of moderate degree 06/16/2017  . Ischemic foot 06/15/2017  . Cellulitis 06/15/2017  . PAD (peripheral artery disease) ([place])   . Osteoporosis Apr 30, 2017  . Loss or death of child 06/17/15  . Status post below-knee amputation of left lower extremity ([place]) 01/31/2015  . Essential hypertension 08/22/2014    Past Surgical History:  Procedure Laterality Date  . ABDOMINAL HYSTERECTOMY     partial  . AMPUTATION Left 01/29/2015   Procedure: Left  AMPUTATION BELOW KNEE;  Surgeon: Elam Dutch, MD;  Location: [facility];  Service: Vascular;  Laterality: Left;  . AMPUTATION Right 06/17/2017   Procedure: Right  BELOW KNEE Amputation;  Surgeon: Elam Dutch, MD;  Location: [place];  Service:  Vascular;  Laterality: Right;  . BIOPSY  08/25/2019   Procedure: BIOPSY;  Surgeon: Wonda Horner, MD;  Location: [facility];  Service: Endoscopy;;  sigmoid  . FLEXIBLE SIGMOIDOSCOPY N/A 08/25/2019   Procedure: FLEXIBLE SIGMOIDOSCOPY;  Surgeon: Wonda Horner, MD;  Location: [facility];  Service: Endoscopy;  Laterality: N/A;  . KNEE SURGERY Right 1984  . LAPAROTOMY N/A 08/30/2019   Procedure: EXPLORATORY LAPAROTOMY WITH SIGMOID RESECTION AND COLOSTOMY PLACEMENT;  Surgeon: Jovita Kussmaul, MD;  Location: [facility] ORS;  Service: General;  Laterality: N/A;  . PERIPHERAL VASCULAR CATHETERIZATION N/A 09/15/2014   Procedure: Abdominal Aortogram;  Surgeon: Elam Dutch, MD;  Location: [facility] INVASIVE CV LAB CUPID;  Service: Cardiovascular;  Laterality: N/A;  . PERIPHERAL VASCULAR CATHETERIZATION Bilateral 09/15/2014   Procedure: Lower Extremity Angiography;  Surgeon: Elam Dutch, MD;  Location: [place] INVASIVE CV LAB CUPID;  Service: Cardiovascular;  Laterality: Bilateral;  . SPINE SURGERY    . SUBMUCOSAL INJECTION  08/25/2019   Procedure: SUBMUCOSAL INJECTION;  Surgeon: Wonda Horner, MD;  Location: [facility];  Service: Endoscopy;;     OB History   No obstetric history on file.     Family History  Problem Relation Age of Onset  . Diabetes Mother   . Heart disease Mother   . Dementia Sister   . Diabetes Son     Social History   Tobacco Use  . Smoking status: Never Smoker  . Smokeless tobacco: Never Used  Substance Use Topics  . Alcohol use: No    Alcohol/week: 0.0 standard drinks  . Drug use: No    Home Medications Prior to Admission medications   Medication Sig Start Date End Date Taking? Authorizing Provider  acetaminophen (TYLENOL) 325 MG tablet Take 2 tablets (650 mg total) by mouth every 6 (six) hours as needed for mild pain (or temp >/= 101 F). 09/05/19   Regalado, Belkys A, MD  clopidogrel (PLAVIX) 75 MG tablet Take 1 tablet (75 mg total) by mouth daily. 07/18/19   Copland,  Gay Filler, MD  docusate sodium (COLACE) 100 MG capsule Take 100 mg by mouth 2 (two) times daily as needed for mild constipation or moderate constipation.    [provider]  guaiFENesin (MUCINEX) 600 MG 12 hr tablet Take 1 tablet (600 mg total) by mouth 2 (two) times daily. 09/05/19   Regalado, Belkys A, MD  ipratropium (ATROVENT) 0.03 % nasal spray Place 2 sprays into both nostrils 3 (three) times daily. Use as needed for allergies and nasal discharge 07/21/19   Copland, Gay Filler, MD  lidocaine (LIDODERM) 5 % Place 1 patch onto the skin daily. Remove & Discard patch within 12 hours or as directed by MD 09/05/19   Regalado, Jerald Kief A, MD  metoprolol tartrate (LOPRESSOR) 25 MG tablet Take 1 tablet (25 mg total) by mouth 2 (two) times daily. 09/05/19   Regalado, Belkys A, MD  nicotine (NICODERM CQ - DOSED IN MG/24 HOURS) 14 mg/24hr patch Place 1 patch (14 mg total) onto the skin daily. 09/06/19   Regalado, Belkys A, MD  nystatin (MYCOSTATIN) 100000 UNIT/ML suspension Take 5 mLs (500,000 Units total) by mouth 4 (four) times daily. 09/05/19   Regalado, Belkys A, MD  omeprazole (PRILOSEC) 20 MG capsule Take 20 mg by mouth daily.    [provider]  Polyethyl Glycol-Propyl Glycol (SYSTANE FREE OP) Place 1 drop into both eyes daily. For dry eyes    [provider]  potassium chloride (KLOR-CON) 10 MEQ tablet Take 3 tablets (30 mEq total) by mouth daily for 3 days. 09/23/19 09/26/19  Delia Heady, PA-C    Allergies    Patient has no known allergies.  Review of Systems   Review of Systems  Constitutional: Negative for appetite change, chills and fever.  HENT: Negative for ear pain, rhinorrhea, sneezing and sore throat.   Eyes: Negative for photophobia and visual disturbance.  Respiratory: Negative for cough, chest tightness, shortness of breath and wheezing.   Cardiovascular: Negative for chest pain and palpitations.  Gastrointestinal: Negative for abdominal pain, blood in stool,  constipation, diarrhea, nausea and vomiting.  Genitourinary: Negative for dysuria, hematuria and urgency.  Musculoskeletal: Negative for myalgias.  Skin: Negative for rash.  Neurological: Negative for dizziness, weakness and light-headedness.  Psychiatric/Behavioral: Positive for confusion.    Physical Exam Updated Vital Signs BP 130/75   Pulse 77   Temp 98 F (36.7 C)   Resp (!) 21   Ht 4\' 2"  (1.27 m)   Wt 57.6 kg   SpO2 97%   BMI 35.72 kg/m   Physical Exam Vitals and nursing note reviewed.  Constitutional:      General: She is not in acute distress.    Appearance: She is well-developed.  HENT:     Head: Normocephalic and atraumatic.     Nose: Nose normal.  Eyes:     General: No scleral icterus.       Left eye: No discharge.     Conjunctiva/sclera:  Conjunctivae normal.  Cardiovascular:     Rate and Rhythm: Normal rate. Rhythm irregularly irregular.     Heart sounds: Normal heart sounds. No murmur. No friction rub. No gallop.   Pulmonary:     Effort: Pulmonary effort is normal. No respiratory distress.     Breath sounds: Normal breath sounds.  Abdominal:     General: Bowel sounds are normal. There is no distension.     Palpations: Abdomen is soft.     Tenderness: There is no abdominal tenderness. There is no guarding.  Musculoskeletal:        General: Normal range of motion.     Cervical back: Normal range of motion and neck supple.  Skin:    General: Skin is warm and dry.     Findings: No rash.     Comments: Bilateral BKA.  Single fluid-filled blisters noted to each stump without surrounding erythema.  Neurological:     Mental Status: She is alert.     Motor: No abnormal muscle tone.     Coordination: Coordination normal.     Comments: Alert, oriented to self, place, situation.  Believes it is May 1991.  Unsure of the day of the week.  Can name her grandson and granddaughter's name. Pupils reactive. No facial asymmetry noted. Cranial nerves appear grossly  intact. Sensation intact to light touch on face, BUE and BLE. Strength 5/5 in BUE.     ED Results / Procedures / Treatments   Labs (all labs ordered are listed, but only abnormal results are displayed) Labs Reviewed  COMPREHENSIVE METABOLIC PANEL - Abnormal; Notable for the following components:      Result Value   Potassium 3.0 (*)    Glucose, Bld 137 (*)    Calcium 8.0 (*)    Total Protein 5.8 (*)    Albumin 2.4 (*)    Alkaline Phosphatase 154 (*)    Total Bilirubin 0.2 (*)    All other components within normal limits  CBC WITH DIFFERENTIAL/PLATELET - Abnormal; Notable for the following components:   WBC 11.4 (*)    RBC 3.16 (*)    Hemoglobin 9.2 (*)    HCT 29.3 (*)    Platelets 452 (*)    Neutro Abs 8.6 (*)    Monocytes Absolute 1.2 (*)    All other components within normal limits  URINE CULTURE  URINALYSIS, ROUTINE W REFLEX MICROSCOPIC  MAGNESIUM    EKG EKG Interpretation  Date/Time:  Friday Sep 23 2019 20:23:55 EDT Ventricular Rate:  90 PR Interval:    QRS Duration: 135 QT Interval:  421 QTC Calculation: 516 R Axis:   31 Text Interpretation: Unknown rhythm, irregular rate Short PR interval Left bundle branch block Confirmed by Dene Gentry [phone]) on 09/23/2019 8:35:41 PM   Radiology DG Chest 2 View  Result Date: 09/23/2019 CLINICAL DATA:  Altered mental status EXAM: CHEST - 2 VIEW COMPARISON:  09/03/2019 FINDINGS: Left pleural effusion suspected to be small moderate in size. Dense consolidation at the left lung base. Stable cardiomediastinal silhouette with aortic atherosclerosis. No pneumothorax. Previously noted right mid lung nodularity not well seen but there is a telemetry lead in the region. IMPRESSION: 1. Left pleural effusion, probably small moderate in size. Dense consolidation at the left base, atelectasis versus pneumonia. 2. Right midlung possible nodule on previous exam not well seen but could be obscured by overlying telemetry leads. Chest CT was  previously recommended. Electronically Signed   By: Donavan Foil M.D.   On:  09/23/2019 21:06   CT Head Wo Contrast  Result Date: 09/23/2019 CLINICAL DATA:  Delirium confusion EXAM: CT HEAD WITHOUT CONTRAST TECHNIQUE: Contiguous axial images were obtained from the base of the skull through the vertex without intravenous contrast. COMPARISON:  CT brain 08/28/2019 FINDINGS: Brain: No acute territorial infarction or intracranial hemorrhage is visualized. Moderate atrophy. Patchy hypodensity in the white matter consistent with chronic small vessel ischemic change. Questionable extra-axial slightly dense mass in the right posterior fossa measuring 2.4 cm with scattered calcification. Normal size of fourth ventricle. Stable ventricle size. Vascular: No hyperdense vessels. Carotid and vertebral calcification Skull: Normal. Negative for fracture or focal lesion. Sinuses/Orbits: No acute finding. Other: None IMPRESSION: 1. No CT evidence for acute intracranial abnormality. 2. Atrophy and chronic small vessel ischemic change of the white matter. 3. Questionable right posterior fossa extra-axial mass; MRI could be obtained for further evaluation if clinically desired. Electronically Signed   By: Donavan Foil M.D.   On: 09/23/2019 21:17    Procedures Procedures (including critical care time)  Medications Ordered in ED Medications  potassium chloride SA (KLOR-CON) CR tablet 60 mEq (60 mEq Oral Given 09/23/19 2245)    ED Course  I have reviewed the triage vital signs and the nursing notes.  Pertinent labs & imaging results that were available during my care of the patient were reviewed by me and considered in my medical decision making (see chart for details).  Clinical Course as of Sep 23 2335  Fri Sep 23, 2019  2234 Potassium(!): 3.0 [HK]    Clinical Course User Index [HK] Delia Heady, PA-C   MDM Rules/Calculators/A&P                      86 year old female with a past medical history of  hypertension, PAD, status post bilateral BKA, recent admission for obstruction caused by colonic mass presenting to the ED for altered mental status.  Patient states that she is here because her family was concerned that she was confused from time to time.  She believes it is because of her recent switch from lisinopril to metoprolol.  States that she overall feels fine, denies any chest pain, abdominal pain, injuries or falls, numbness in arms or legs.  On exam she has a fluid-filled blister noted to each BKA site without signs of infection.  She states that this happens from time to time due to "friction from the sock."  Abdomen is soft, nontender nondistended.  She is alert, oriented to self, place, situation.  She can name her grandchildren's names.  No facial asymmetry noted.  Normal sensation to upper and lower extremities and face.  She is afebrile without recent use of antipyretics.  EKG shows irregular beat but no evidence of A. fib.  EKG looks similar to prior tracings. Lab work significant for hemoglobin of 9.2 which is similar to baseline.  Hypokalemia of 3 which was repleted here.  Normal mag. CXR shows left-sided pleural effusion with possible pneumonia.  CT of the head without any acute abnormalities, does show possible extra-axial mass.  Chart review shows that patient appears to have mets to liver. Today's workup could show mets to the lung. After discussion with the patient, she is declining admission or further workup of her known cancer with mets. I feel this is reasonable. UA shows no signs of infection or other abnormality. Will treat with PO potassium, have her f/u with PCP regarding medication changes and f/u with oncologist. Patient agreeable to  the plan.  Return precautions given. Patient discussed with and seen by the attending, Dr. Francia Greaves.  All imaging, if done today, including plain films, CT scans, and ultrasounds, independently reviewed by me, and interpretations confirmed via  formal radiology reads.  Patient is hemodynamically stable, in NAD. Evaluation does not show pathology that would require ongoing emergent intervention or inpatient treatment. I explained the diagnosis to the patient. Pain has been managed and has no complaints prior to discharge. Patient is comfortable with above plan and is stable for discharge at this time. All questions were answered prior to disposition. Strict return precautions for returning to the ED were discussed. Encouraged follow up with PCP.   An After Visit Summary was printed and given to the patient.   Portions of this note were generated with [employer]. Dictation errors may occur despite best attempts at proofreading.  Final Clinical Impression(s) / ED Diagnoses Final diagnoses:  Hypokalemia    Rx / DC Orders ED Discharge Orders         Ordered    potassium chloride (KLOR-CON) 10 MEQ tablet  Daily     09/23/19 2239           Delia Heady, PA-C 09/23/19 2337    Valarie Merino, MD 09/24/19 1456

## 2019-09-23 NOTE — Discharge Instructions (Addendum)
Take the potassium pills as directed. Have your potassium level rechecked by your primary care provider next week. Follow-up with your primary care provider regarding today's visit and imaging findings. Follow-up with the oncologist as well. Return to the ER for worsening confusion, chest pain, abdominal pain, vomiting, injuries or falls.

## 2019-09-25 LAB — URINE CULTURE: Culture: 20000 — AB

## 2019-09-28 DIAGNOSIS — C787 Secondary malignant neoplasm of liver and intrahepatic bile duct: Secondary | ICD-10-CM | POA: Diagnosis not present

## 2019-09-28 DIAGNOSIS — I10 Essential (primary) hypertension: Secondary | ICD-10-CM | POA: Diagnosis not present

## 2019-09-28 DIAGNOSIS — Z433 Encounter for attention to colostomy: Secondary | ICD-10-CM | POA: Diagnosis not present

## 2019-09-28 DIAGNOSIS — C187 Malignant neoplasm of sigmoid colon: Secondary | ICD-10-CM | POA: Diagnosis not present

## 2019-09-28 DIAGNOSIS — Z483 Aftercare following surgery for neoplasm: Secondary | ICD-10-CM | POA: Diagnosis not present

## 2019-09-28 DIAGNOSIS — I739 Peripheral vascular disease, unspecified: Secondary | ICD-10-CM | POA: Diagnosis not present

## 2019-09-30 DIAGNOSIS — Z433 Encounter for attention to colostomy: Secondary | ICD-10-CM | POA: Diagnosis not present

## 2019-09-30 DIAGNOSIS — I10 Essential (primary) hypertension: Secondary | ICD-10-CM | POA: Diagnosis not present

## 2019-09-30 DIAGNOSIS — Z483 Aftercare following surgery for neoplasm: Secondary | ICD-10-CM | POA: Diagnosis not present

## 2019-09-30 DIAGNOSIS — C787 Secondary malignant neoplasm of liver and intrahepatic bile duct: Secondary | ICD-10-CM | POA: Diagnosis not present

## 2019-09-30 DIAGNOSIS — C187 Malignant neoplasm of sigmoid colon: Secondary | ICD-10-CM | POA: Diagnosis not present

## 2019-09-30 DIAGNOSIS — I739 Peripheral vascular disease, unspecified: Secondary | ICD-10-CM | POA: Diagnosis not present

## 2019-10-06 DIAGNOSIS — I10 Essential (primary) hypertension: Secondary | ICD-10-CM | POA: Diagnosis not present

## 2019-10-06 DIAGNOSIS — Z483 Aftercare following surgery for neoplasm: Secondary | ICD-10-CM | POA: Diagnosis not present

## 2019-10-06 DIAGNOSIS — I739 Peripheral vascular disease, unspecified: Secondary | ICD-10-CM | POA: Diagnosis not present

## 2019-10-06 DIAGNOSIS — C187 Malignant neoplasm of sigmoid colon: Secondary | ICD-10-CM | POA: Diagnosis not present

## 2019-10-06 DIAGNOSIS — C787 Secondary malignant neoplasm of liver and intrahepatic bile duct: Secondary | ICD-10-CM | POA: Diagnosis not present

## 2019-10-06 DIAGNOSIS — Z433 Encounter for attention to colostomy: Secondary | ICD-10-CM | POA: Diagnosis not present

## 2019-10-06 NOTE — Progress Notes (Signed)
I connected with [name] today by telephone and verified that I am speaking with the correct person using two identifiers. Location patient: home Location provider: work Persons participating in the virtual visit: patient, Therapist, sports.   I discussed the limitations, risks, security and privacy concerns of performing an evaluation and management service by telephone and the availability of in person appointments. I also discussed with the patient that there may be a patient responsible charge related to this service. The patient expressed understanding and verbally consented to this telephonic visit.    Interactive audio and video telecommunications were attempted between RN and patient, however failed, due to patient having technical difficulties OR patient did not have access to video capability.  We continued and completed visit with audio only.  Some vital signs may be absent or patient reported.    Subjective:   [name] is a 86 y.o. female who presents for Medicare Annual (Subsequent) preventive examination.  Review of Systems:  Cardiac Risk Factors include: advanced age (>41men, >34 women);hypertension   Home Safety/Smoke Alarms: Feels safe in home. Smoke alarms in place.  Lives w/ grandson. 2 story home. Her bedroom on 1 st floor. Uses rollator.  Female:       Mammo- declines      Dexa scan- declines     Objective:     Vitals: Unable to assess. This visit is enabled though telemedicine due to Covid 19.   Advanced Directives 10/07/2019 09/23/2019 08/30/2019 08/23/2019 05/19/2018 07/30/2017 07/15/2017  Does Patient Have a Medical Advance Directive? Yes Yes - Yes Yes Yes Yes  Type of Advance Directive [place];Living will [place];Living will;Out of facility DNR (pink MOST or yellow form) Out of facility DNR (pink MOST or yellow form) [place];Living will Healthcare Power of [place] of [place]  Does patient want to make changes to medical advance directive? No - Patient declined Yes (ED - Information included in AVS) - No - Patient declined - No - Patient declined No - Patient declined  Copy of [place] in Chart? No - copy requested No - copy requested - No - copy requested No - copy requested - -  Would patient like information on creating a medical advance directive? - - - - - - -    Tobacco Social History   Tobacco Use  Smoking Status Never Smoker  Smokeless Tobacco Never Used     Counseling given: Not Answered   Clinical Intake:     Pain : No/denies pain                 Past Medical History:  Diagnosis Date  . Arthritis   . Cancer ([place])    utertrine  . Hypertension   . Osteoporosis 04/15/2017  . PAD (peripheral artery disease) ([place])    Past Surgical History:  Procedure Laterality Date  . ABDOMINAL HYSTERECTOMY     partial  . AMPUTATION Left 01/29/2015   Procedure: Left  AMPUTATION BELOW KNEE;  Surgeon: Elam Dutch, MD;  Location: [facility];  Service: Vascular;  Laterality: Left;  . AMPUTATION Right 06/17/2017   Procedure: Right  BELOW KNEE Amputation;  Surgeon: Elam Dutch, MD;  Location: [facility];  Service: Vascular;  Laterality: Right;  . BIOPSY  08/25/2019   Procedure: BIOPSY;  Surgeon: Wonda Horner, MD;  Location: [facility];  Service: Endoscopy;;  sigmoid  . FLEXIBLE SIGMOIDOSCOPY N/A 08/25/2019  Procedure: FLEXIBLE SIGMOIDOSCOPY;  Surgeon: Wonda Horner, MD;  Location: [facility];  Service: Endoscopy;  Laterality: N/A;  . KNEE SURGERY Right 1984  . LAPAROTOMY N/A 08/30/2019   Procedure: EXPLORATORY LAPAROTOMY WITH SIGMOID RESECTION AND COLOSTOMY PLACEMENT;  Surgeon: Jovita Kussmaul, MD;  Location: [facility];  Service: General;  Laterality: N/A;  . PERIPHERAL VASCULAR CATHETERIZATION N/A 09/15/2014   Procedure: Abdominal Aortogram;  Surgeon: Elam Dutch, MD;  Location: [place] [facility];   Service: Cardiovascular;  Laterality: N/A;  . PERIPHERAL VASCULAR CATHETERIZATION Bilateral 09/15/2014   Procedure: Lower Extremity Angiography;  Surgeon: Elam Dutch, MD;  Location: [place] INVASIVE CV LAB CUPID;  Service: Cardiovascular;  Laterality: Bilateral;  . SPINE SURGERY    . SUBMUCOSAL INJECTION  08/25/2019   Procedure: SUBMUCOSAL INJECTION;  Surgeon: Wonda Horner, MD;  Location: [facility];  Service: Endoscopy;;   Family History  Problem Relation Age of Onset  . Diabetes Mother   . Heart disease Mother   . Dementia Sister   . Diabetes Son    Social History   Socioeconomic History  . Marital status: Widowed    Spouse name: Not on file  . Number of children: Not on file  . Years of education: Not on file  . Highest education level: Not on file  Occupational History  . Not on file  Tobacco Use  . Smoking status: Never Smoker  . Smokeless tobacco: Never Used  Substance and Sexual Activity  . Alcohol use: No    Alcohol/week: 0.0 standard drinks  . Drug use: No  . Sexual activity: Not on file  Other Topics Concern  . Not on file  Social History Narrative  . Not on file   Social Determinants of Health   Financial Resource Strain: Low Risk   . Difficulty of Paying Living Expenses: Not hard at all  Food Insecurity: No Food Insecurity  . Worried About Charity fundraiser in the Last Year: Never true  . Ran Out of Food in the Last Year: Never true  Transportation Needs: No Transportation Needs  . Lack of Transportation (Medical): No  . Lack of Transportation (Non-Medical): No  Physical Activity:   . Days of Exercise per Week:   . Minutes of Exercise per Session:   Stress:   . Feeling of Stress :   Social Connections:   . Frequency of Communication with Friends and Family:   . Frequency of Social Gatherings with Friends and Family:   . Attends Religious Services:   . Active Member of Clubs or Organizations:   . Attends Archivist Meetings:   Marland Kitchen  Marital Status:     Outpatient Encounter Medications as of 10/07/2019  Medication Sig  . acetaminophen (TYLENOL) 325 MG tablet Take 2 tablets (650 mg total) by mouth every 6 (six) hours as needed for mild pain (or temp >/= 101 F).  . clopidogrel (PLAVIX) 75 MG tablet Take 1 tablet (75 mg total) by mouth daily.  Marland Kitchen docusate sodium (COLACE) 100 MG capsule Take 100 mg by mouth 2 (two) times daily as needed for mild constipation or moderate constipation.  Marland Kitchen guaiFENesin (MUCINEX) 600 MG 12 hr tablet Take 1 tablet (600 mg total) by mouth 2 (two) times daily.  Marland Kitchen ipratropium (ATROVENT) 0.03 % nasal spray Place 2 sprays into both nostrils 3 (three) times daily. Use as needed for allergies and nasal discharge  . lidocaine (LIDODERM) 5 % Place 1 patch onto the skin daily.  Remove & Discard patch within 12 hours or as directed by MD  . metoprolol tartrate (LOPRESSOR) 25 MG tablet Take 1 tablet (25 mg total) by mouth 2 (two) times daily.  Marland Kitchen nystatin (MYCOSTATIN) 100000 UNIT/ML suspension Take 5 mLs (500,000 Units total) by mouth 4 (four) times daily.  Marland Kitchen omeprazole (PRILOSEC) 20 MG capsule Take 20 mg by mouth daily.  Vladimir Faster Glycol-Propyl Glycol (SYSTANE FREE OP) Place 1 drop into both eyes daily. For dry eyes  . nicotine (NICODERM CQ - DOSED IN MG/24 HOURS) 14 mg/24hr patch Place 1 patch (14 mg total) onto the skin daily. (Patient not taking: Reported on 10/07/2019)  . potassium chloride (KLOR-CON) 10 MEQ tablet Take 3 tablets (30 mEq total) by mouth daily for 3 days.   No facility-administered encounter medications on file as of 10/07/2019.    Activities of Daily Living In your present state of health, do you have any difficulty performing the following activities: 10/07/2019 08/25/2019  Hearing? [place]? N -  Difficulty concentrating or making decisions? Y -  Walking or climbing stairs? Y -  Dressing or bathing? Y -  Doing errands, shopping? Tempie Donning  Preparing Food and eating ? Y -  Using the Toilet?  N -  In the past six months, have you accidently leaked urine? Y -  Do you have problems with loss of bowel control? N -  Managing your Medications? Y -  Managing your Finances? Y -  Housekeeping or managing your Housekeeping? Y -  Some recent data might be hidden    Patient Care Team: Copland, Gay Filler, MD as PCP - General (Family Medicine) O'Neal, Cassie Freer, MD as PCP - Cardiology (Internal Medicine)    Assessment:   This is a routine wellness examination for [facility]. Physical assessment deferred to PCP.  Exercise Activities and Dietary recommendations Current Exercise Habits: The patient does not participate in regular exercise at present, Exercise limited by: None identified Diet (meal preparation, eat out, water intake, caffeinated beverages, dairy products, fruits and vegetables): well balanced, on average, 3 meals per day   Goals    . Maintain current health       Fall Risk Fall Risk  10/07/2019 04/14/2018 09/14/2017 08/28/2017 08/17/2017  Falls in the past year? 0 0 (No Data) (No Data) (No Data)  Comment - - Patient/ caregiver deny new/ recent falls No new/ recent falls, per report of patient's caregiver, [name], on [facility] CM written consent No new/ recent falls, per patient's grandson/ HCPOA, [name], on [facility] CM written consent  Number falls in past yr: 0 - - - -  Injury with Fall? 0 - - - -  Risk for fall due to : - - - - -  Risk for fall due to: Comment - - - - -  Follow up Education provided;Falls prevention discussed - - - -   Depression Screen PHQ 2/9 Scores 10/07/2019 04/14/2018 07/30/2017 04/01/2017  PHQ - 2 Score 0 0 0 0     Cognitive Function  MMSE - Mini Mental State Exam 10/07/2019 04/01/2017  Not completed: Refused -  Orientation to time - 5  Orientation to Place - 5  Registration - 3  Attention/ Calculation - 4  Recall - 3  Language- name 2 objects - 2  Language- repeat - 1  Language- follow 3 step command - 3  Language- read & follow direction - 1    Write a sentence - 1  Copy design - 1  Total score - 29        Immunization History  Administered Date(s) Administered  . Influenza, High Dose Seasonal PF 02/02/2017  . Influenza,inj,Quad PF,6+ Mos 01/31/2015  . Influenza-Unspecified 03/05/2016, 02/01/2018  . Pneumococcal Conjugate-13 03/31/2016  . Pneumococcal Polysaccharide-23 05/12/2012  . Zoster Recombinat (Shingrix) 09/30/2016, 02/01/2018   Screening Tests Health Maintenance  Topic Date Due  . COVID-19 Vaccine (1) Never done  . TETANUS/TDAP  Never done  . INFLUENZA VACCINE  12/11/2019  . DEXA SCAN  Completed  . PNA vac Low Risk Adult  Completed      Plan:    Please schedule your next medicare wellness visit with me in 1 yr.  Continue to eat heart healthy diet (full of fruits, vegetables, whole grains, lean protein, water--limit salt, fat, and sugar intake) and increase physical activity as tolerated.  Continue doing brain stimulating activities (puzzles, reading, adult coloring books, staying active) to keep memory sharp.   Bring a copy of your living will and/or healthcare power of attorney to your next office visit.    I have personally reviewed and noted the following in the patient's chart:   . Medical and social history . Use of alcohol, tobacco or illicit drugs  . Current medications and supplements . Functional ability and status . Nutritional status . Physical activity . Advanced directives . List of other physicians . Hospitalizations, surgeries, and ER visits in previous 12 months . Vitals . Screenings to include cognitive, depression, and falls . Referrals and appointments  In addition, I have reviewed and discussed with patient certain preventive protocols, quality metrics, and best practice recommendations. A written personalized care plan for preventive services as well as general preventive health recommendations were provided to patient.     Naaman Plummer [place], [place]  10/07/2019

## 2019-10-07 ENCOUNTER — Ambulatory Visit (INDEPENDENT_AMBULATORY_CARE_PROVIDER_SITE_OTHER): Payer: Medicare Other | Admitting: *Deleted

## 2019-10-07 ENCOUNTER — Encounter: Payer: Self-pay | Admitting: *Deleted

## 2019-10-07 ENCOUNTER — Other Ambulatory Visit: Payer: Self-pay

## 2019-10-07 DIAGNOSIS — Z0000 Encounter for general adult medical examination without abnormal findings: Secondary | ICD-10-CM | POA: Diagnosis not present

## 2019-10-07 NOTE — Patient Instructions (Signed)
Please schedule your next medicare wellness visit with me in 1 yr.  Continue to eat heart healthy diet (full of fruits, vegetables, whole grains, lean protein, water--limit salt, fat, and sugar intake) and increase physical activity as tolerated.  Continue doing brain stimulating activities (puzzles, reading, adult coloring books, staying active) to keep memory sharp.   Bring a copy of your living will and/or healthcare power of attorney to your next office visit.    [name] , Thank you for taking time to come for your Medicare Wellness Visit. I appreciate your ongoing commitment to your health goals. Please review the following plan we discussed and let me know if I can assist you in the future.   These are the goals we discussed: Goals    . Maintain current health       This is a list of the screening recommended for you and due dates:  Health Maintenance  Topic Date Due  . COVID-19 Vaccine (1) Never done  . Tetanus Vaccine  Never done  . Flu Shot  12/11/2019  . DEXA scan (bone density measurement)  Completed  . Pneumonia vaccines  Completed    Preventive Care 18 Years and Older, Female Preventive care refers to lifestyle choices and visits with your health care provider that can promote health and wellness. This includes:  A yearly physical exam. This is also called an annual well check.  Regular dental and eye exams.  Immunizations.  Screening for certain conditions.  Healthy lifestyle choices, such as diet and exercise. What can I expect for my preventive care visit? Physical exam Your health care provider will check:  Height and weight. These may be used to calculate body mass index (BMI), which is a measurement that tells if you are at a healthy weight.  Heart rate and blood pressure.  Your skin for abnormal spots. Counseling Your health care provider may ask you questions about:  Alcohol, tobacco, and drug use.  Emotional well-being.  Home and  relationship well-being.  Sexual activity.  Eating habits.  History of falls.  Memory and ability to understand (cognition).  Work and work Statistician.  Pregnancy and menstrual history. What immunizations do I need?  Influenza (flu) vaccine  This is recommended every year. Tetanus, diphtheria, and pertussis (Tdap) vaccine  You may need a Td booster every 10 years. Varicella (chickenpox) vaccine  You may need this vaccine if you have not already been vaccinated. Zoster (shingles) vaccine  You may need this after age 89. Pneumococcal conjugate (PCV13) vaccine  One dose is recommended after age 88. Pneumococcal polysaccharide (PPSV23) vaccine  One dose is recommended after age 23. Measles, mumps, and rubella (MMR) vaccine  You may need at least one dose of MMR if you were born in 1957 or later. You may also need a second dose. Meningococcal conjugate (MenACWY) vaccine  You may need this if you have certain conditions. Hepatitis A vaccine  You may need this if you have certain conditions or if you travel or work in places where you may be exposed to hepatitis A. Hepatitis B vaccine  You may need this if you have certain conditions or if you travel or work in places where you may be exposed to hepatitis B. Haemophilus influenzae type b (Hib) vaccine  You may need this if you have certain conditions. You may receive vaccines as individual doses or as more than one vaccine together in one shot (combination vaccines). Talk with your health care provider about the  risks and benefits of combination vaccines. What tests do I need? Blood tests  Lipid and cholesterol levels. These may be checked every 5 years, or more frequently depending on your overall health.  Hepatitis C test.  Hepatitis B test. Screening  Lung cancer screening. You may have this screening every year starting at age 7 if you have a 30-pack-year history of smoking and currently smoke or have quit  within the past 15 years.  Colorectal cancer screening. All adults should have this screening starting at age 64 and continuing until age 76. Your health care provider may recommend screening at age 32 if you are at increased risk. You will have tests every 1-10 years, depending on your results and the type of screening test.  Diabetes screening. This is done by checking your blood sugar (glucose) after you have not eaten for a while (fasting). You may have this done every 1-3 years.  Mammogram. This may be done every 1-2 years. Talk with your health care provider about how often you should have regular mammograms.  BRCA-related cancer screening. This may be done if you have a family history of breast, ovarian, tubal, or peritoneal cancers. Other tests  Sexually transmitted disease (STD) testing.  Bone density scan. This is done to screen for osteoporosis. You may have this done starting at age 38. Follow these instructions at home: Eating and drinking  Eat a diet that includes fresh fruits and vegetables, whole grains, lean protein, and low-fat dairy products. Limit your intake of foods with high amounts of sugar, saturated fats, and salt.  Take vitamin and mineral supplements as recommended by your health care provider.  Do not drink alcohol if your health care provider tells you not to drink.  If you drink alcohol: ? Limit how much you have to 0-1 drink a day. ? Be aware of how much alcohol is in your drink. In the [place] one drink equals one 12 oz bottle of beer (355 mL), one 5 oz glass of wine (148 mL), or one 1 oz glass of hard liquor (44 mL). Lifestyle  Take daily care of your teeth and gums.  Stay active. Exercise for at least 30 minutes on 5 or more days each week.  Do not use any products that contain nicotine or tobacco, such as cigarettes, e-cigarettes, and chewing tobacco. If you need help quitting, ask your health care provider.  If you are sexually active, practice  safe sex. Use a condom or other form of protection in order to prevent STIs (sexually transmitted infections).  Talk with your health care provider about taking a low-dose aspirin or statin. What's next?  Go to your health care provider once a year for a well check visit.  Ask your health care provider how often you should have your eyes and teeth checked.  Stay up to date on all vaccines. This information is not intended to replace advice given to you by your health care provider. Make sure you discuss any questions you have with your health care provider. Document Revised: 04/22/2018 Document Reviewed: 04/22/2018 Elsevier Patient Education  2020 [employer].

## 2019-10-08 DIAGNOSIS — F05 Delirium due to known physiological condition: Secondary | ICD-10-CM | POA: Diagnosis not present

## 2019-10-08 DIAGNOSIS — K219 Gastro-esophageal reflux disease without esophagitis: Secondary | ICD-10-CM | POA: Diagnosis not present

## 2019-10-08 DIAGNOSIS — C787 Secondary malignant neoplasm of liver and intrahepatic bile duct: Secondary | ICD-10-CM | POA: Diagnosis not present

## 2019-10-08 DIAGNOSIS — I10 Essential (primary) hypertension: Secondary | ICD-10-CM | POA: Diagnosis not present

## 2019-10-08 DIAGNOSIS — I739 Peripheral vascular disease, unspecified: Secondary | ICD-10-CM | POA: Diagnosis not present

## 2019-10-08 DIAGNOSIS — C187 Malignant neoplasm of sigmoid colon: Secondary | ICD-10-CM | POA: Diagnosis not present

## 2019-10-08 DIAGNOSIS — Z483 Aftercare following surgery for neoplasm: Secondary | ICD-10-CM | POA: Diagnosis not present

## 2019-10-08 DIAGNOSIS — Z89512 Acquired absence of left leg below knee: Secondary | ICD-10-CM | POA: Diagnosis not present

## 2019-10-08 DIAGNOSIS — Z433 Encounter for attention to colostomy: Secondary | ICD-10-CM | POA: Diagnosis not present

## 2019-10-08 DIAGNOSIS — Z7902 Long term (current) use of antithrombotics/antiplatelets: Secondary | ICD-10-CM | POA: Diagnosis not present

## 2019-10-08 DIAGNOSIS — Z9181 History of falling: Secondary | ICD-10-CM | POA: Diagnosis not present

## 2019-10-08 DIAGNOSIS — Z89511 Acquired absence of right leg below knee: Secondary | ICD-10-CM | POA: Diagnosis not present

## 2019-10-13 DIAGNOSIS — I10 Essential (primary) hypertension: Secondary | ICD-10-CM | POA: Diagnosis not present

## 2019-10-13 DIAGNOSIS — Z483 Aftercare following surgery for neoplasm: Secondary | ICD-10-CM | POA: Diagnosis not present

## 2019-10-13 DIAGNOSIS — C187 Malignant neoplasm of sigmoid colon: Secondary | ICD-10-CM | POA: Diagnosis not present

## 2019-10-13 DIAGNOSIS — Z433 Encounter for attention to colostomy: Secondary | ICD-10-CM | POA: Diagnosis not present

## 2019-10-13 DIAGNOSIS — C787 Secondary malignant neoplasm of liver and intrahepatic bile duct: Secondary | ICD-10-CM | POA: Diagnosis not present

## 2019-10-13 DIAGNOSIS — I739 Peripheral vascular disease, unspecified: Secondary | ICD-10-CM | POA: Diagnosis not present

## 2019-10-17 ENCOUNTER — Telehealth: Payer: Self-pay

## 2019-10-17 NOTE — Telephone Encounter (Signed)
Received call from patient's spouse [name] to change MD visit scheduled for 1020 tomorrow to a phone visit because patient currently has the "pink eye".  Scheduling message.

## 2019-10-17 NOTE — Telephone Encounter (Signed)
Done

## 2019-10-18 ENCOUNTER — Inpatient Hospital Stay: Payer: Medicare Other | Attending: Hematology | Admitting: Hematology

## 2019-10-18 ENCOUNTER — Other Ambulatory Visit: Payer: Medicare Other

## 2019-10-18 DIAGNOSIS — Z7189 Other specified counseling: Secondary | ICD-10-CM | POA: Diagnosis not present

## 2019-10-18 DIAGNOSIS — C787 Secondary malignant neoplasm of liver and intrahepatic bile duct: Secondary | ICD-10-CM | POA: Diagnosis not present

## 2019-10-18 DIAGNOSIS — C189 Malignant neoplasm of colon, unspecified: Secondary | ICD-10-CM | POA: Diagnosis not present

## 2019-10-18 NOTE — Progress Notes (Signed)
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  The patient came to the emergency room for abdominal pain which have been present for 3 to 4 days.  This was present over her lower abdomen and was worsening.  She had not had a bowel movement for 3 to 4 days.  A CT of the abdomen pelvis with contrast was performed which showed an infiltrating sigmoid colon mass concerning for colon cancer with resulting distal colonic obstruction as well as a large peripherally enhancing mass involving the right hepatic lobe with additional smaller lesions in the right hepatic lobe concerning for metastatic colon cancer.  The patient was seen by general surgery and gastroenterology.  She had a flexible sigmoidoscopy performed on 08/25/2019 showed a malignant partially obstructing tumor in the sigmoid colon which was biopsied.  The biopsy was consistent with adenocarcinoma.  Mismatch repair protein was normal.  The patient was taken to the OR on 08/30/2019 due to her obstructing sigmoid mass and underwent exploratory laparotomy, sigmoid colectomy with descending colostomy.  Final surgical pathology is pending.  When seen today, no family is at the bedside.  The patient reports that her grandson helps her make medical decisions.  The patient reports that prior to admission she had not really noticed much of an appetite loss but states that she lost weight and noticed this only when she was weighed on admission.  She is unsure how much weight  she has lost.  She reports her abdominal pain has improved since surgery.  She was started on a regular diet today.  So far she is tolerating this well.  She denies nausea and vomiting.  She has not noticed any melena or hematochezia prior to admission.  Denies prior history of colonoscopy.  She denies headaches, but has some dizziness.  Denies chest pain or shortness of breath.  Denies lower extremity edema.  The patient is widowed.  She lives by herself but her grandson lives at home just behind her.  She had 1 son who is deceased.  Denies history of alcohol and tobacco use.  No family history of cancer or blood disorders.  Medical oncology was asked see the patient to make recommendations regarding her newly diagnosed adenocarcinoma of the sigmoid colon and liver mass.  INTERVAL HISTORY:   I connected with  [name] on 10/19/19 by telephone and verified that I am speaking with the correct person using two identifiers.   I discussed the limitations of evaluation and management by telemedicine. The patient expressed understanding and agreed to proceed.  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 The patient was last seen in the hospital. The pt reports that she is doing well overall.  The  pt reports that she has been healing well and feeling well overall. Pt has been eating three meals a day and is back to her baseline. Pt is often unable to get restful sleep as she has to urinate frequently during the night. She denies any issues with incontinence during the day.   Of note since the patient's last visit, pt has had Sigmoid Mass Surgical Pathology Report ([id number]) completed on  08/25/2019 with results revealing "Adenocarcinoma".  Pt has had Sigmoid Colon Surgical Pathology Report ([id number]) completed on 08/30/2019 with results revealing  "- Colorectal adenocarcinoma, 3.5 cm. - Carcinoma extends into pericolonic connective tissue and focally to subserosa. - Carcinoma focally involves radial resection margin. - Proximal and distal margins are free of tumor. - Metastatic carcinoma in three of eleven lymph nodes (3/11)".  Pt has had Colon Molecular Pathology [phone] [phone]) completed on 08/30/2019 with results revealing "MSI-Stable".   On review of systems, pt reports urinary frequency and denies low appetite, abdominal pain and any other symptoms.    MEDICAL HISTORY:  Past Medical History:  Diagnosis Date  . Arthritis   . Cancer ([place])    utertrine  . Hypertension   . Osteoporosis 04/15/2017  . PAD (peripheral artery disease) ([place])     SURGICAL HISTORY: Past Surgical History:  Procedure Laterality Date  . ABDOMINAL HYSTERECTOMY     partial  . AMPUTATION Left 01/29/2015   Procedure: Left  AMPUTATION BELOW KNEE;  Surgeon: Elam Dutch, MD;  Location: [place];  Service: Vascular;  Laterality: Left;  . AMPUTATION Right 06/17/2017   Procedure: Right  BELOW KNEE Amputation;  Surgeon: Elam Dutch, MD;  Location: [place];  Service: Vascular;  Laterality: Right;  . BIOPSY  08/25/2019   Procedure: BIOPSY;  Surgeon: Wonda Horner, MD;  Location: [facility];  Service: Endoscopy;;  sigmoid  . FLEXIBLE SIGMOIDOSCOPY N/A 08/25/2019   Procedure: FLEXIBLE SIGMOIDOSCOPY;  Surgeon: Wonda Horner, MD;  Location: [facility];  Service: Endoscopy;  Laterality: N/A;  . KNEE SURGERY Right 1984  . LAPAROTOMY N/A 08/30/2019   Procedure: EXPLORATORY LAPAROTOMY WITH SIGMOID RESECTION AND COLOSTOMY PLACEMENT;  Surgeon: Jovita Kussmaul, MD;  Location: [facility];  Service: General;  Laterality: N/A;  . PERIPHERAL VASCULAR CATHETERIZATION N/A 09/15/2014   Procedure: Abdominal Aortogram;   Surgeon: Elam Dutch, MD;  Location: [facility];  Service: Cardiovascular;  Laterality: N/A;  . PERIPHERAL VASCULAR CATHETERIZATION Bilateral 09/15/2014   Procedure: Lower Extremity Angiography;  Surgeon: Elam Dutch, MD;  Location: [facility];  Service: Cardiovascular;  Laterality: Bilateral;  . SPINE SURGERY    . SUBMUCOSAL INJECTION  08/25/2019   Procedure: SUBMUCOSAL INJECTION;  Surgeon: Wonda Horner, MD;  Location: [facility];  Service: Endoscopy;;    SOCIAL HISTORY: Social History   Socioeconomic History  . Marital status: Widowed    Spouse name: Not on file  . Number of children: Not on file  . Years of education: Not on file  . Highest education level: Not on file  Occupational History  . Not on file  Tobacco Use  . Smoking status: Never Smoker  . Smokeless tobacco: Never Used  Substance and Sexual Activity  . Alcohol use: No    Alcohol/week: 0.0 standard drinks  . Drug use: No  . Sexual activity: Not on file  Other Topics Concern  . Not on file  Social History Narrative  . Not on file   Social Determinants of Health  Financial Resource Strain: Low Risk   . Difficulty of Paying Living Expenses: Not hard at all  Food Insecurity: No Food Insecurity  . Worried About Charity fundraiser in the Last Year: Never true  . Ran Out of Food in the Last Year: Never true  Transportation Needs: No Transportation Needs  . Lack of Transportation (Medical): No  . Lack of Transportation (Non-Medical): No  Physical Activity:   . Days of Exercise per Week:   . Minutes of Exercise per Session:   Stress:   . Feeling of Stress :   Social Connections:   . Frequency of Communication with Friends and Family:   . Frequency of Social Gatherings with Friends and Family:   . Attends Religious Services:   . Active Member of Clubs or Organizations:   . Attends Archivist Meetings:   Marland Kitchen Marital Status:   Intimate Partner Violence:   .  Fear of Current or Ex-Partner:   . Emotionally Abused:   Marland Kitchen Physically Abused:   . Sexually Abused:     FAMILY HISTORY: Family History  Problem Relation Age of Onset  . Diabetes Mother   . Heart disease Mother   . Dementia Sister   . Diabetes Son     ALLERGIES:  has No Known Allergies.  MEDICATIONS:  Current Outpatient Medications  Medication Sig Dispense Refill  . acetaminophen (TYLENOL) 325 MG tablet Take 2 tablets (650 mg total) by mouth every 6 (six) hours as needed for mild pain (or temp >/= 101 F).    . clopidogrel (PLAVIX) 75 MG tablet Take 1 tablet (75 mg total) by mouth daily. 90 tablet 3  . docusate sodium (COLACE) 100 MG capsule Take 100 mg by mouth 2 (two) times daily as needed for mild constipation or moderate constipation.    Marland Kitchen guaiFENesin (MUCINEX) 600 MG 12 hr tablet Take 1 tablet (600 mg total) by mouth 2 (two) times daily. 30 tablet 0  . ipratropium (ATROVENT) 0.03 % nasal spray Place 2 sprays into both nostrils 3 (three) times daily. Use as needed for allergies and nasal discharge 30 mL 12  . lidocaine (LIDODERM) 5 % Place 1 patch onto the skin daily. Remove & Discard patch within 12 hours or as directed by MD 30 patch 0  . metoprolol tartrate (LOPRESSOR) 25 MG tablet Take 1 tablet (25 mg total) by mouth 2 (two) times daily. 60 tablet 0  . nicotine (NICODERM CQ - DOSED IN MG/24 HOURS) 14 mg/24hr patch Place 1 patch (14 mg total) onto the skin daily. (Patient not taking: Reported on 10/07/2019) 28 patch 0  . nystatin (MYCOSTATIN) 100000 UNIT/ML suspension Take 5 mLs (500,000 Units total) by mouth 4 (four) times daily. 60 mL 0  . omeprazole (PRILOSEC) 20 MG capsule Take 20 mg by mouth daily.    Vladimir Faster Glycol-Propyl Glycol (SYSTANE FREE OP) Place 1 drop into both eyes daily. For dry eyes    . potassium chloride (KLOR-CON) 10 MEQ tablet Take 3 tablets (30 mEq total) by mouth daily for 3 days. 9 tablet 0   No current facility-administered medications for this visit.      REVIEW OF SYSTEMS:    10 Point review of Systems was done is negative except as noted above.  PHYSICAL EXAMINATION: ECOG PERFORMANCE STATUS: 2 - Symptomatic, <50% confined to bed  Telehealth visit  LABORATORY DATA:  I have reviewed the data as listed  . CBC Latest Ref Rng & Units 09/23/2019 09/04/2019 09/03/2019  WBC 4.0 - 10.5 K/uL 11.4(H) 10.8(H) 15.2(H)  Hemoglobin 12.0 - 15.0 g/dL 9.2(L) 9.1(L) 9.8(L)  Hematocrit 36.0 - 46.0 % 29.3(L) 28.0(L) 29.9(L)  Platelets 150 - 400 K/uL 452(H) 284 286    . CMP Latest Ref Rng & Units 09/23/2019 09/04/2019 09/03/2019  Glucose 70 - 99 mg/dL 137(H) 71 122(H)  BUN 8 - 23 mg/dL _0 Creatinine 0.44 - 1.00 mg/dL 0.45 0.37(L) 0.30(L)  Sodium 135 - 145 mmol/L 137 142 136  Potassium 3.5 - 5.1 mmol/L 3.0(L) 3.5 3.5  Chloride 98 - 111 mmol/L 104 111 105  CO2 22 - 32 mmol/L _1 Calcium 8.9 - 10.3 mg/dL 8.0(L) 7.9(L) 7.9(L)  Total Protein 6.5 - 8.1 g/dL 5.8(L) - -  Total Bilirubin 0.3 - 1.2 mg/dL 0.2(L) - -  Alkaline Phos 38 - 126 U/L 154(H) - -  AST 15 - 41 U/L 25 - -  ALT 0 - 44 U/L 17 - -   08/30/2019 Colon Molecular Pathology [phone]):    08/30/2019 Sigmoid Colon Surgical Pathology Report ([id number]):   08/25/2019 Sigmoid Mass Surgical Pathology Report ([id number]):   RADIOGRAPHIC STUDIES: I have personally reviewed the radiological images as listed and agreed with the findings in the report. DG Chest 2 View  Result Date: 09/23/2019 CLINICAL DATA:  Altered mental status EXAM: CHEST - 2 VIEW COMPARISON:  09/03/2019 FINDINGS: Left pleural effusion suspected to be small moderate in size. Dense consolidation at the left lung base. Stable cardiomediastinal silhouette with aortic atherosclerosis. No pneumothorax. Previously noted right mid lung nodularity not well seen but there is a telemetry lead in the region. IMPRESSION: 1. Left pleural effusion, probably small moderate in size. Dense consolidation at the left base,  atelectasis versus pneumonia. 2. Right midlung possible nodule on previous exam not well seen but could be obscured by overlying telemetry leads. Chest CT was previously recommended. Electronically Signed   By: Donavan Foil M.D.   On: 09/23/2019 21:06   CT Head Wo Contrast  Result Date: 09/23/2019 CLINICAL DATA:  Delirium confusion EXAM: CT HEAD WITHOUT CONTRAST TECHNIQUE: Contiguous axial images were obtained from the base of the skull through the vertex without intravenous contrast. COMPARISON:  CT brain 08/28/2019 FINDINGS: Brain: No acute territorial infarction or intracranial hemorrhage is visualized. Moderate atrophy. Patchy hypodensity in the white matter consistent with chronic small vessel ischemic change. Questionable extra-axial slightly dense mass in the right posterior fossa measuring 2.4 cm with scattered calcification. Normal size of fourth ventricle. Stable ventricle size. Vascular: No hyperdense vessels. Carotid and vertebral calcification Skull: Normal. Negative for fracture or focal lesion. Sinuses/Orbits: No acute finding. Other: None IMPRESSION: 1. No CT evidence for acute intracranial abnormality. 2. Atrophy and chronic small vessel ischemic change of the white matter. 3. Questionable right posterior fossa extra-axial mass; MRI could be obtained for further evaluation if clinically desired. Electronically Signed   By: Donavan Foil M.D.   On: 09/23/2019 21:17    ASSESSMENT & PLAN:  1.  Adenocarcinoma of the sigmoid colon 08/23/2019 CT of the abdomen pelvis with contrast - "1. Suspected infiltrating sigmoid colon mass, highly suspicious for colon cancer, with resulting distal colonic obstruction. 2. Large peripherally enhancing mass involving the right hepatic lobe with additional smaller lesions in the right lobe, most consistent with metastatic colon cancer. There are no morphologic changes of cirrhosis to suggest hepatocellular carcinoma. 3. Long segment wall thickening and  hyperenhancement in the distal small bowel, without focal mass lesion, possibly inflammatory. There  is resulting mild proximal upstream small-bowel dilatation." -08/24/2019 CEA 89.4 -08/25/2019 pathology from sigmoid colon consistent with adenocarcinoma, mismatch repair protein normal  2.  Liver mass - ?  Metastasis from colon versus second primary  3.  Anemia secondary to underlying malignancy and GI blood loss  4.  Peripheral artery disease  5.  Hypertension  PLAN: -Discussed 08/25/2019 Sigmoid Mass Surgical Pathology Report ([id number]) which revealed "Adenocarcinoma". -Discussed 08/30/2019 Sigmoid Colon Surgical Pathology Report ([id number]) which revealed  "- Colorectal adenocarcinoma, 3.5 cm. - Carcinoma extends into pericolonic connective tissue and focally to subserosa. - Carcinoma focally involves radial resection margin. - Proximal and distal margins are free of tumor. - Metastatic carcinoma in three of eleven lymph nodes (3/11)". -Discussed 08/30/2019 Colon Molecular Pathology [phone]) which revealed "MSI-Stable". -Advised pt that her Colon Cancer has metastasized to lymph nodes and liver. Would not be considered curable.  -Advised pt that any treatment would be palliative  -Advised pt that palliative chemotherapy could potential increase life span if she tolerated it but it likely that she might not tolerated palliative chemotherapy and it would add significant burden to medical cares and would require significant cancer center f/u. -Advised pt that we must weigh the risks versus benefits of chemotherapy when considering pt's age, other health concerns, and burden of care. -we discussed her goals of care in details with her and her grandson. -Pt would not prefer to begin palliative chemotherapy treatment at this time.  -Recommend home hospice care for assistance with symptom management - pt agrees with this    FOLLOW UP: Refer to home hospice srvices  All of the  patients questions were answered with apparent satisfaction. The patient knows to call the clinic with any problems, questions or concerns.  I spent 30 mins counseling the patient. The total time spent in the appointment was 40 mins and more than 50% was on counseling and direct patient cares.    Sullivan Lone MD [place] AAHIVMS [facility] Hematology/Oncology Physician [facility]  (Office):       [phone] (Work cell):  [phone] (Fax):           [phone]  10/19/2019 6:25 AM  I, Yevette Edwards, am acting as a scribe for Dr. Sullivan Lone.   .I have reviewed the above documentation for accuracy and completeness, and I agree with the above. Brunetta Genera MD

## 2019-10-20 DIAGNOSIS — Z433 Encounter for attention to colostomy: Secondary | ICD-10-CM | POA: Diagnosis not present

## 2019-10-20 DIAGNOSIS — C787 Secondary malignant neoplasm of liver and intrahepatic bile duct: Secondary | ICD-10-CM | POA: Diagnosis not present

## 2019-10-20 DIAGNOSIS — Z483 Aftercare following surgery for neoplasm: Secondary | ICD-10-CM | POA: Diagnosis not present

## 2019-10-20 DIAGNOSIS — I739 Peripheral vascular disease, unspecified: Secondary | ICD-10-CM | POA: Diagnosis not present

## 2019-10-20 DIAGNOSIS — I10 Essential (primary) hypertension: Secondary | ICD-10-CM | POA: Diagnosis not present

## 2019-10-20 DIAGNOSIS — C187 Malignant neoplasm of sigmoid colon: Secondary | ICD-10-CM | POA: Diagnosis not present

## 2019-10-25 ENCOUNTER — Telehealth: Payer: Self-pay | Admitting: *Deleted

## 2019-10-25 NOTE — Telephone Encounter (Signed)
Per Dr.Kale's request - contacted [facility] for a home hospice referral for Ms Timko for metastatic colon cancer as she has declined any consideration of palliative chemotherapy. Gave patient info to [place] at [facility] and gave grandson's Emogene Morgan) phone number as listed on chart.

## 2019-10-27 DIAGNOSIS — Z433 Encounter for attention to colostomy: Secondary | ICD-10-CM | POA: Diagnosis not present

## 2019-10-27 DIAGNOSIS — C187 Malignant neoplasm of sigmoid colon: Secondary | ICD-10-CM | POA: Diagnosis not present

## 2019-10-27 DIAGNOSIS — Z483 Aftercare following surgery for neoplasm: Secondary | ICD-10-CM | POA: Diagnosis not present

## 2019-10-27 DIAGNOSIS — I10 Essential (primary) hypertension: Secondary | ICD-10-CM | POA: Diagnosis not present

## 2019-10-27 DIAGNOSIS — C787 Secondary malignant neoplasm of liver and intrahepatic bile duct: Secondary | ICD-10-CM | POA: Diagnosis not present

## 2019-10-27 DIAGNOSIS — I739 Peripheral vascular disease, unspecified: Secondary | ICD-10-CM | POA: Diagnosis not present

## 2019-11-03 DIAGNOSIS — Z433 Encounter for attention to colostomy: Secondary | ICD-10-CM | POA: Diagnosis not present

## 2019-11-03 DIAGNOSIS — I739 Peripheral vascular disease, unspecified: Secondary | ICD-10-CM | POA: Diagnosis not present

## 2019-11-03 DIAGNOSIS — I10 Essential (primary) hypertension: Secondary | ICD-10-CM | POA: Diagnosis not present

## 2019-11-03 DIAGNOSIS — Z483 Aftercare following surgery for neoplasm: Secondary | ICD-10-CM | POA: Diagnosis not present

## 2019-11-03 DIAGNOSIS — C787 Secondary malignant neoplasm of liver and intrahepatic bile duct: Secondary | ICD-10-CM | POA: Diagnosis not present

## 2019-11-03 DIAGNOSIS — C187 Malignant neoplasm of sigmoid colon: Secondary | ICD-10-CM | POA: Diagnosis not present

## 2019-11-07 DIAGNOSIS — Z89511 Acquired absence of right leg below knee: Secondary | ICD-10-CM | POA: Diagnosis not present

## 2019-11-07 DIAGNOSIS — Z9181 History of falling: Secondary | ICD-10-CM | POA: Diagnosis not present

## 2019-11-07 DIAGNOSIS — Z89512 Acquired absence of left leg below knee: Secondary | ICD-10-CM | POA: Diagnosis not present

## 2019-11-07 DIAGNOSIS — K219 Gastro-esophageal reflux disease without esophagitis: Secondary | ICD-10-CM | POA: Diagnosis not present

## 2019-11-07 DIAGNOSIS — Z7902 Long term (current) use of antithrombotics/antiplatelets: Secondary | ICD-10-CM | POA: Diagnosis not present

## 2019-11-07 DIAGNOSIS — Z7901 Long term (current) use of anticoagulants: Secondary | ICD-10-CM | POA: Diagnosis not present

## 2019-11-07 DIAGNOSIS — K5900 Constipation, unspecified: Secondary | ICD-10-CM | POA: Diagnosis not present

## 2019-11-07 DIAGNOSIS — I10 Essential (primary) hypertension: Secondary | ICD-10-CM | POA: Diagnosis not present

## 2019-11-07 DIAGNOSIS — C787 Secondary malignant neoplasm of liver and intrahepatic bile duct: Secondary | ICD-10-CM | POA: Diagnosis not present

## 2019-11-07 DIAGNOSIS — F05 Delirium due to known physiological condition: Secondary | ICD-10-CM | POA: Diagnosis not present

## 2019-11-07 DIAGNOSIS — C187 Malignant neoplasm of sigmoid colon: Secondary | ICD-10-CM | POA: Diagnosis not present

## 2019-11-07 DIAGNOSIS — I739 Peripheral vascular disease, unspecified: Secondary | ICD-10-CM | POA: Diagnosis not present

## 2019-11-07 DIAGNOSIS — Z433 Encounter for attention to colostomy: Secondary | ICD-10-CM | POA: Diagnosis not present

## 2019-11-09 ENCOUNTER — Telehealth: Payer: Self-pay | Admitting: Family Medicine

## 2019-11-09 DIAGNOSIS — C187 Malignant neoplasm of sigmoid colon: Secondary | ICD-10-CM | POA: Diagnosis not present

## 2019-11-09 DIAGNOSIS — C787 Secondary malignant neoplasm of liver and intrahepatic bile duct: Secondary | ICD-10-CM | POA: Diagnosis not present

## 2019-11-09 DIAGNOSIS — I10 Essential (primary) hypertension: Secondary | ICD-10-CM | POA: Diagnosis not present

## 2019-11-09 DIAGNOSIS — F05 Delirium due to known physiological condition: Secondary | ICD-10-CM | POA: Diagnosis not present

## 2019-11-09 DIAGNOSIS — I739 Peripheral vascular disease, unspecified: Secondary | ICD-10-CM | POA: Diagnosis not present

## 2019-11-09 DIAGNOSIS — Z433 Encounter for attention to colostomy: Secondary | ICD-10-CM | POA: Diagnosis not present

## 2019-11-09 NOTE — Telephone Encounter (Signed)
I have written letter/order for patients wheelchair. Placed in pcp folder for signature/review. Advise is any changes are necessary.

## 2019-11-09 NOTE — Telephone Encounter (Signed)
CallerTillie Rung ([place]) Call back phone number: [phone]  Need an order for a manual wheelchair standard size with elevated leg rest.

## 2019-11-10 NOTE — Telephone Encounter (Signed)
done

## 2019-11-17 DIAGNOSIS — C187 Malignant neoplasm of sigmoid colon: Secondary | ICD-10-CM | POA: Diagnosis not present

## 2019-11-17 DIAGNOSIS — Z433 Encounter for attention to colostomy: Secondary | ICD-10-CM | POA: Diagnosis not present

## 2019-11-17 DIAGNOSIS — I10 Essential (primary) hypertension: Secondary | ICD-10-CM | POA: Diagnosis not present

## 2019-11-17 DIAGNOSIS — C787 Secondary malignant neoplasm of liver and intrahepatic bile duct: Secondary | ICD-10-CM | POA: Diagnosis not present

## 2019-11-17 DIAGNOSIS — F05 Delirium due to known physiological condition: Secondary | ICD-10-CM | POA: Diagnosis not present

## 2019-11-17 DIAGNOSIS — I739 Peripheral vascular disease, unspecified: Secondary | ICD-10-CM | POA: Diagnosis not present

## 2019-11-24 DIAGNOSIS — I739 Peripheral vascular disease, unspecified: Secondary | ICD-10-CM | POA: Diagnosis not present

## 2019-11-24 DIAGNOSIS — C187 Malignant neoplasm of sigmoid colon: Secondary | ICD-10-CM | POA: Diagnosis not present

## 2019-11-24 DIAGNOSIS — F05 Delirium due to known physiological condition: Secondary | ICD-10-CM | POA: Diagnosis not present

## 2019-11-24 DIAGNOSIS — Z433 Encounter for attention to colostomy: Secondary | ICD-10-CM | POA: Diagnosis not present

## 2019-11-24 DIAGNOSIS — C787 Secondary malignant neoplasm of liver and intrahepatic bile duct: Secondary | ICD-10-CM | POA: Diagnosis not present

## 2019-11-24 DIAGNOSIS — I10 Essential (primary) hypertension: Secondary | ICD-10-CM | POA: Diagnosis not present

## 2019-12-02 DIAGNOSIS — C787 Secondary malignant neoplasm of liver and intrahepatic bile duct: Secondary | ICD-10-CM | POA: Diagnosis not present

## 2019-12-02 DIAGNOSIS — I10 Essential (primary) hypertension: Secondary | ICD-10-CM | POA: Diagnosis not present

## 2019-12-02 DIAGNOSIS — Z433 Encounter for attention to colostomy: Secondary | ICD-10-CM | POA: Diagnosis not present

## 2019-12-02 DIAGNOSIS — I739 Peripheral vascular disease, unspecified: Secondary | ICD-10-CM | POA: Diagnosis not present

## 2019-12-02 DIAGNOSIS — C187 Malignant neoplasm of sigmoid colon: Secondary | ICD-10-CM | POA: Diagnosis not present

## 2019-12-02 DIAGNOSIS — F05 Delirium due to known physiological condition: Secondary | ICD-10-CM | POA: Diagnosis not present

## 2019-12-05 ENCOUNTER — Encounter: Payer: Self-pay | Admitting: Family Medicine

## 2019-12-05 DIAGNOSIS — R8290 Unspecified abnormal findings in urine: Secondary | ICD-10-CM

## 2019-12-05 MED ORDER — CEPHALEXIN 500 MG PO CAPS: 500.0000 mg | ORAL_CAPSULE | Freq: Two times a day (BID) | ORAL | 0 refills | Status: DC

## 2019-12-05 NOTE — Telephone Encounter (Signed)
We will treat UTI without office visit in this patient with bilateral leg amputations and advanced colon cancer

## 2019-12-07 DIAGNOSIS — C787 Secondary malignant neoplasm of liver and intrahepatic bile duct: Secondary | ICD-10-CM | POA: Diagnosis not present

## 2019-12-07 DIAGNOSIS — K219 Gastro-esophageal reflux disease without esophagitis: Secondary | ICD-10-CM | POA: Diagnosis not present

## 2019-12-07 DIAGNOSIS — Z89511 Acquired absence of right leg below knee: Secondary | ICD-10-CM | POA: Diagnosis not present

## 2019-12-07 DIAGNOSIS — Z7901 Long term (current) use of anticoagulants: Secondary | ICD-10-CM | POA: Diagnosis not present

## 2019-12-07 DIAGNOSIS — K5900 Constipation, unspecified: Secondary | ICD-10-CM | POA: Diagnosis not present

## 2019-12-07 DIAGNOSIS — Z89512 Acquired absence of left leg below knee: Secondary | ICD-10-CM | POA: Diagnosis not present

## 2019-12-07 DIAGNOSIS — C187 Malignant neoplasm of sigmoid colon: Secondary | ICD-10-CM | POA: Diagnosis not present

## 2019-12-07 DIAGNOSIS — F05 Delirium due to known physiological condition: Secondary | ICD-10-CM | POA: Diagnosis not present

## 2019-12-07 DIAGNOSIS — Z433 Encounter for attention to colostomy: Secondary | ICD-10-CM | POA: Diagnosis not present

## 2019-12-07 DIAGNOSIS — I10 Essential (primary) hypertension: Secondary | ICD-10-CM | POA: Diagnosis not present

## 2019-12-07 DIAGNOSIS — I739 Peripheral vascular disease, unspecified: Secondary | ICD-10-CM | POA: Diagnosis not present

## 2019-12-07 DIAGNOSIS — Z9181 History of falling: Secondary | ICD-10-CM | POA: Diagnosis not present

## 2019-12-07 DIAGNOSIS — Z7902 Long term (current) use of antithrombotics/antiplatelets: Secondary | ICD-10-CM | POA: Diagnosis not present

## 2019-12-16 DIAGNOSIS — C187 Malignant neoplasm of sigmoid colon: Secondary | ICD-10-CM | POA: Diagnosis not present

## 2019-12-16 DIAGNOSIS — F05 Delirium due to known physiological condition: Secondary | ICD-10-CM | POA: Diagnosis not present

## 2019-12-16 DIAGNOSIS — I10 Essential (primary) hypertension: Secondary | ICD-10-CM | POA: Diagnosis not present

## 2019-12-16 DIAGNOSIS — C787 Secondary malignant neoplasm of liver and intrahepatic bile duct: Secondary | ICD-10-CM | POA: Diagnosis not present

## 2019-12-16 DIAGNOSIS — Z433 Encounter for attention to colostomy: Secondary | ICD-10-CM | POA: Diagnosis not present

## 2019-12-16 DIAGNOSIS — I739 Peripheral vascular disease, unspecified: Secondary | ICD-10-CM | POA: Diagnosis not present

## 2019-12-19 ENCOUNTER — Telehealth: Payer: Self-pay | Admitting: Family Medicine

## 2019-12-19 ENCOUNTER — Encounter: Payer: Self-pay | Admitting: Family Medicine

## 2019-12-19 DIAGNOSIS — R8290 Unspecified abnormal findings in urine: Secondary | ICD-10-CM

## 2019-12-19 MED ORDER — CEPHALEXIN 500 MG PO CAPS: 500.0000 mg | ORAL_CAPSULE | Freq: Two times a day (BID) | ORAL | 0 refills | Status: AC

## 2019-12-19 NOTE — Telephone Encounter (Signed)
Caller: Tillie Rung    Requesting office notes to be sent via fax @ [phone] to [place] and explanation as to why patient needs a Wheelchair.

## 2019-12-20 NOTE — Telephone Encounter (Signed)
I can send notes and letter however can you clarify for my understand the reasoning for wheelchair to let them know?

## 2019-12-20 NOTE — Telephone Encounter (Signed)
Letter has been faxed to [facility].

## 2019-12-20 NOTE — Telephone Encounter (Signed)
Pt is s/p bilateral leg amputation. TY!

## 2019-12-23 ENCOUNTER — Telehealth: Payer: Self-pay | Admitting: Family Medicine

## 2019-12-23 NOTE — Telephone Encounter (Signed)
Almyra Free with [facility] called   States that patient can not receive wheelchair until their office gets supporting details (clinical notes) Per julie she will fax a form over for Dr Lorelei Pont to look at , per Almyra Free she may want to look at accessories for wheelchair, since patient is a double amputee.

## 2019-12-23 NOTE — Telephone Encounter (Signed)
Caller : [place]  Call Back # [phone]  Please send office notes to [facility] for patient, patient has a order placed for wheelchair and need supporting clinical office notes.

## 2019-12-27 NOTE — Telephone Encounter (Signed)
Placed form in provider folder to sign. Will fax once completed.

## 2019-12-30 DIAGNOSIS — I739 Peripheral vascular disease, unspecified: Secondary | ICD-10-CM | POA: Diagnosis not present

## 2019-12-30 DIAGNOSIS — C187 Malignant neoplasm of sigmoid colon: Secondary | ICD-10-CM | POA: Diagnosis not present

## 2019-12-30 DIAGNOSIS — F05 Delirium due to known physiological condition: Secondary | ICD-10-CM | POA: Diagnosis not present

## 2019-12-30 DIAGNOSIS — Z433 Encounter for attention to colostomy: Secondary | ICD-10-CM | POA: Diagnosis not present

## 2019-12-30 DIAGNOSIS — C787 Secondary malignant neoplasm of liver and intrahepatic bile duct: Secondary | ICD-10-CM | POA: Diagnosis not present

## 2019-12-30 DIAGNOSIS — I10 Essential (primary) hypertension: Secondary | ICD-10-CM | POA: Diagnosis not present

## 2020-01-02 ENCOUNTER — Encounter: Payer: Self-pay | Admitting: Family Medicine

## 2020-01-02 NOTE — Telephone Encounter (Signed)
Called [name] back He notes that his GM is having persistent urinary incontinence for 6 months She cannot sense when she needs to urinate/ too difficult for her to get to the restroom They are interested in getting a Purewick system to help with hygiene  However [name] is also likely nearing the end of life- she is confused and [name] is having a hard time taking care of her.  He is planning to call Hospice tomorrow to assess which is a good idea

## 2020-01-06 ENCOUNTER — Encounter: Payer: Self-pay | Admitting: Family Medicine

## 2020-01-06 ENCOUNTER — Telehealth: Payer: Self-pay | Admitting: Family Medicine

## 2020-01-06 DIAGNOSIS — C787 Secondary malignant neoplasm of liver and intrahepatic bile duct: Secondary | ICD-10-CM

## 2020-01-06 DIAGNOSIS — Z433 Encounter for attention to colostomy: Secondary | ICD-10-CM | POA: Diagnosis not present

## 2020-01-06 NOTE — Telephone Encounter (Signed)
Please advise 

## 2020-01-06 NOTE — Telephone Encounter (Signed)
Pended referral. Please check behind me for correct input.

## 2020-01-06 NOTE — Telephone Encounter (Signed)
Gerri calling to see if Dr. Lorelei Pont you like to be listed as her attending provider while in Hospice care. Please advise

## 2020-01-07 NOTE — Telephone Encounter (Signed)
Called them back- yes, I am glad to be attending for [facility]

## 2020-01-09 ENCOUNTER — Telehealth: Payer: Self-pay | Admitting: Family Medicine

## 2020-01-09 NOTE — Telephone Encounter (Signed)
Tried [employer], line busy x2. Will try again.

## 2020-01-09 NOTE — Telephone Encounter (Signed)
CallerLattie Haw Call Back @ [phone]  Per Lattie Haw, Patient d/c from [facility] services due to son calling hospice in. So that patient could be eligible to  [facility]. Per Lattie Haw, Patient went with palliative care and she would like to provide wound care for patient. [facility] will need a referral to begin care asap. They would like to start care they week.

## 2020-01-09 NOTE — Telephone Encounter (Signed)
Adam with [facility] calling to Request for a Pallative care order per patient's request.  Adam sates that the Purex system machine is not covered at hospice so that's why they are requesting for [place].... Fax Number to fax over order is [phone]

## 2020-01-10 NOTE — Telephone Encounter (Signed)
SW scheduled in home palliative care visit with grandson, for Wed 01-11-2020 @130pm .

## 2020-01-10 NOTE — Telephone Encounter (Signed)
Tried calling that number again. Still busy. I believe it is the wrong number. I called [facility] which informed me the only Lattie Haw they have is [employer]. Her number is [phone] however when I called that number no one answered. Sent to voicemail for a "[name]" ,Left detailed message in hopes to getting some kind of guidance as to what we can do for the patient.

## 2020-01-11 ENCOUNTER — Other Ambulatory Visit: Payer: Medicare Other

## 2020-01-11 ENCOUNTER — Telehealth: Payer: Self-pay | Admitting: Family Medicine

## 2020-01-11 ENCOUNTER — Other Ambulatory Visit: Payer: Self-pay

## 2020-01-11 DIAGNOSIS — Z515 Encounter for palliative care: Secondary | ICD-10-CM

## 2020-01-11 NOTE — Progress Notes (Signed)
COMMUNITY PALLIATIVE CARE SW NOTE  PATIENT NAME: [name] DOB: January 14, 1932 MRN: [id number]  PRIMARY CARE PROVIDER: [name], MD  RESPONSIBLE PARTY:  Acct ID - Guarantor Home Phone Work Phone Relationship Acct Type  [id number] Domingo [id number] [phone] Self P/F     [address], [place], [place] [address]     PLAN OF CARE and INTERVENTIONS:             1. GOALS OF CARE/ ADVANCE CARE PLANNING:  Patient is a full code. [name], states he interested in completing a MOST form. [name] is HCPOA. 2. SOCIAL/EMOTIONAL/SPIRITUAL ASSESSMENT/ INTERVENTIONS:  SW and RN met with patient and grandson in patients home. [name] is primary caregiver. 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 Per grandson, patient has delcined physically and cognitively =since recent hospitalization. Per grandson patient eats about 2 meals a day. Patient has skin tears to lower arms, that grandson cleanse and wraps daily. No pain noted. Patient only take 2 meds, per grandson. Patient sleeps well. SW provided education on palliative care, discussed goals, reviewed care plan, provided emotional support, used active and reflective listening. Grandson had dicussed hospice services with PCP recently and decided not to go with Hospice due to Hospice not covering the [facility] cath system. Hospice services dicussed at length with grandson. [name] has decided to have Hospice services for patient and understands that purewick machine will not be covered. [name] is open to patient having foley cath placed instead to assist with urinary retention. SW will make palliative care team and PCP aware. 3. PATIENT/CAREGIVER EDUCATION/ COPING: Patient was alert with confusion. Patient enjoys spending time with her great-granddaughter that lives with her. Patient's grandson is supportive, limited social support.  4. PERSONAL EMERGENCY PLAN: Family to call 911 for emergencies 5. COMMUNITY  RESOURCES COORDINATION/ HEALTH CARE NAVIGATION:  Grandson manages care. [facility] had [facility], but had stopped recently due to talks about Hospice. 6. FINANCIAL/LEGAL CONCERNS/INTERVENTIONS:  None.     SOCIAL HX:  Social History   Tobacco Use  . Smoking status: Never Smoker  . Smokeless tobacco: Never Used  Substance Use Topics  . Alcohol use: No    Alcohol/week: 0.0 standard drinks    CODE STATUS: Full code ADVANCED DIRECTIVES: Y MOST FORM COMPLETE: N HOSPICE EDUCATION PROVIDED: Y  MVE:HMCNOBS is total care. Grandson provides all ADL's assist and pericare.  Time Spent: 1hr.       Doreene Eland, LCSW

## 2020-01-11 NOTE — Telephone Encounter (Signed)
Caller : [place], [place]  Call Back  # [phone]  [place] with [facility] states she meet with patient and grandson , they would like to proceed with [place].

## 2020-01-12 NOTE — Telephone Encounter (Signed)
Left message to return call to [place] needing more info to help the patient and find out what they need for [place].

## 2020-01-27 ENCOUNTER — Telehealth: Payer: Self-pay | Admitting: Family Medicine

## 2020-01-30 ENCOUNTER — Telehealth: Payer: Self-pay | Admitting: Family Medicine

## 2020-01-30 NOTE — Telephone Encounter (Signed)
Certificate placed on providers desk for signature.

## 2020-01-30 NOTE — Telephone Encounter (Signed)
[facility] dropped off Death Certificate document to be filled out by provider. Please call [facility] when  document ready to pick up at [phone] and fax copy to [phone]. Document given to CMA.

## 2020-01-31 NOTE — Telephone Encounter (Signed)
[facility] calling to check status of death certificate. Per [facility] home cremation is on hold until they get the certificate. [name] is requesting a call back once completed.

## 2020-02-01 NOTE — Telephone Encounter (Signed)
Called funeral home- death cert ready, will fax now and they will pick up

## 2020-02-05 DIAGNOSIS — Z433 Encounter for attention to colostomy: Secondary | ICD-10-CM | POA: Diagnosis not present

## 2020-02-10 NOTE — Telephone Encounter (Signed)
Noted- called home health. Note to family, will watch for death cert.

## 2020-02-10 NOTE — Telephone Encounter (Signed)
Caller name: [name] [facility]) Call back number: [phone]  [name] is calling to report time of death. Patient died on 02/02/2020 at 08:55am.

## 2020-02-10 DEATH — deceased

## 2022-03-14 IMAGING — CT CT ABD-PELV W/ CM
2 of 5 series · 14 of 46 positions shown, 16 images · IV contrast (omnipaque)
Comparison: None.

CLINICAL DATA: Abdominal distension and constipation. Intermittent
vomiting. Suspected bowel obstruction.

EXAM:
CT ABDOMEN AND PELVIS WITH CONTRAST
TECHNIQUE: Multidetector CT imaging of the abdomen and pelvis was performed
using the standard protocol following bolus administration of
intravenous contrast.
CONTRAST:  100mL OMNIPAQUE IOHEXOL 300 MG/ML  SOLN

[Series 2: axial st · axial · 0.91mm/px · z∈[+782,+1176]mm · 11 of 93 slices shown, 13 images]
[im 7/93  soft-tissue]
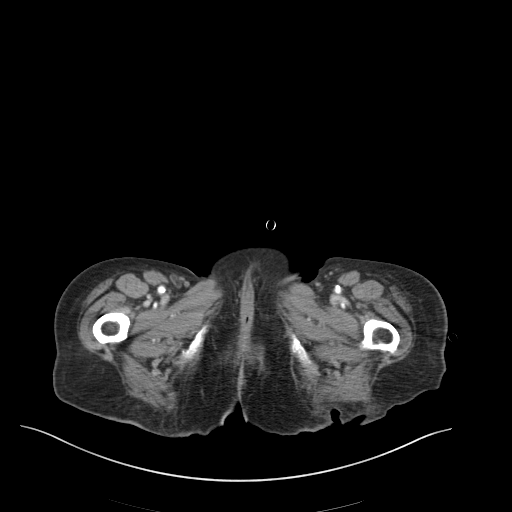
[im 7/93  bone]
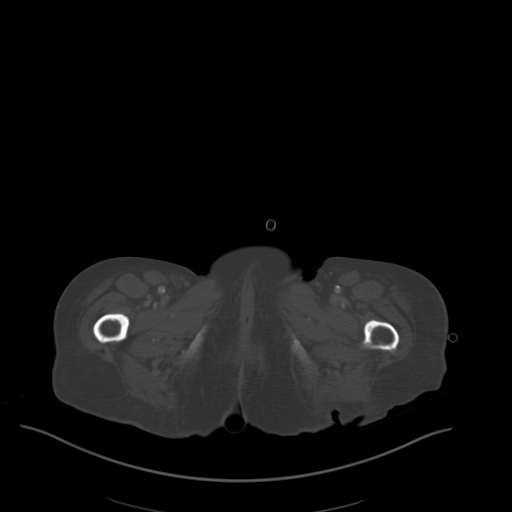
[im 13/93  soft-tissue]
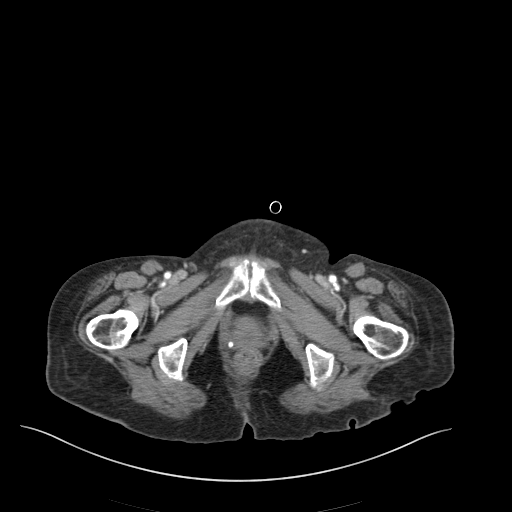
[im 25/93  soft-tissue]
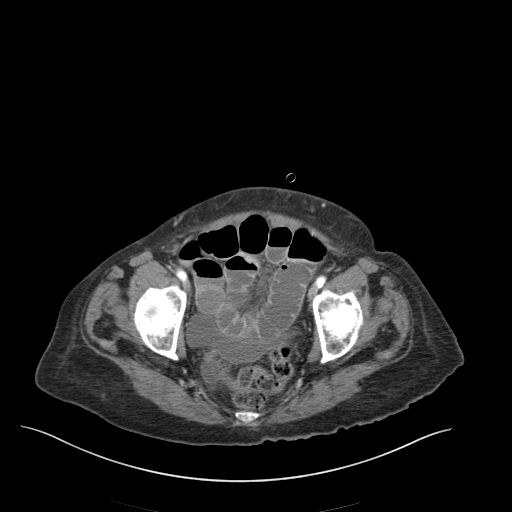
[im 31/93  soft-tissue]
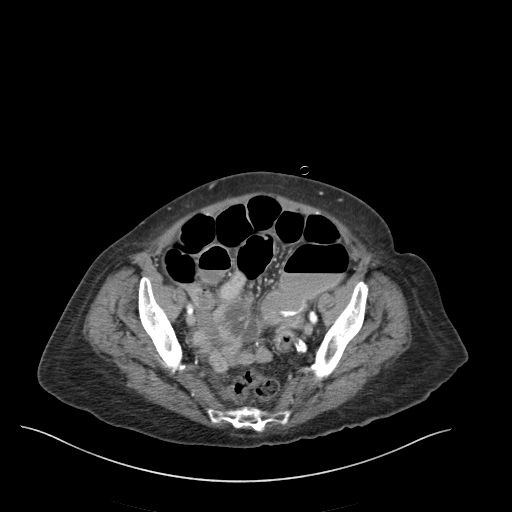
[im 37/93  soft-tissue]
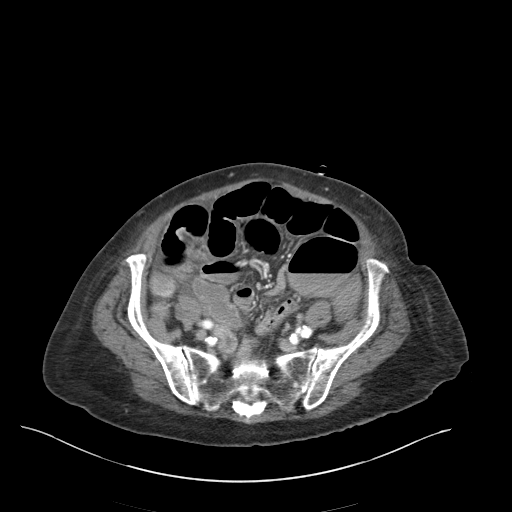
[im 50/93  soft-tissue]
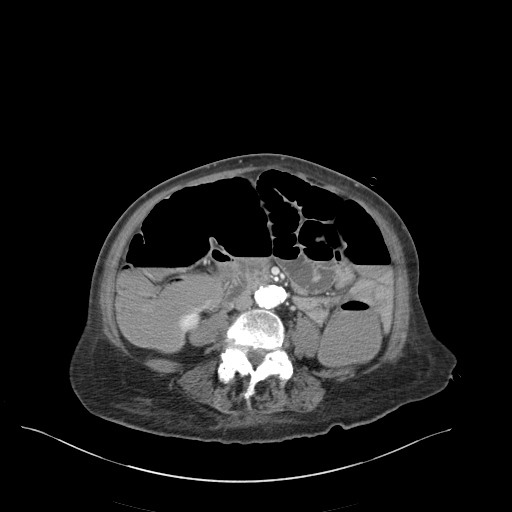
[im 56/93  soft-tissue]
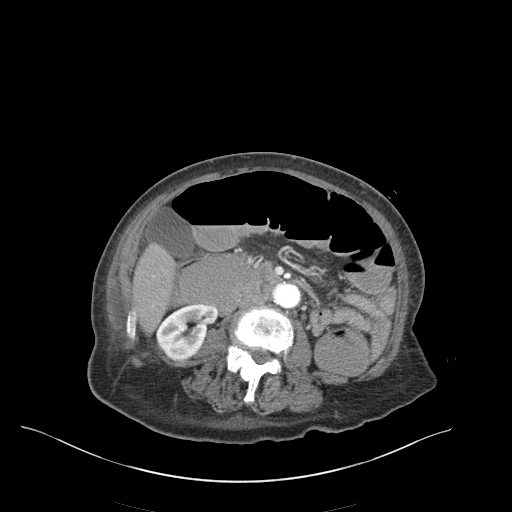
[im 62/93  soft-tissue]
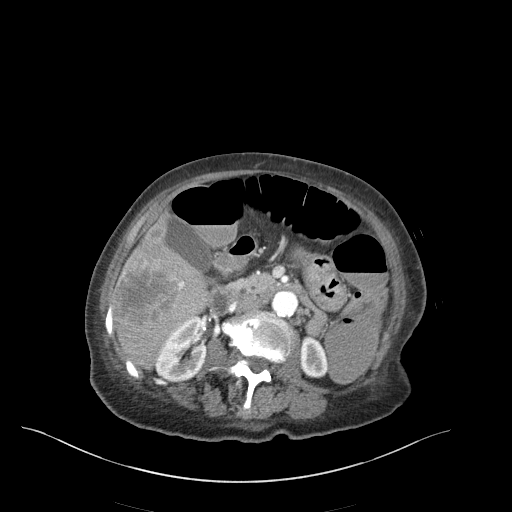
[im 68/93  soft-tissue]
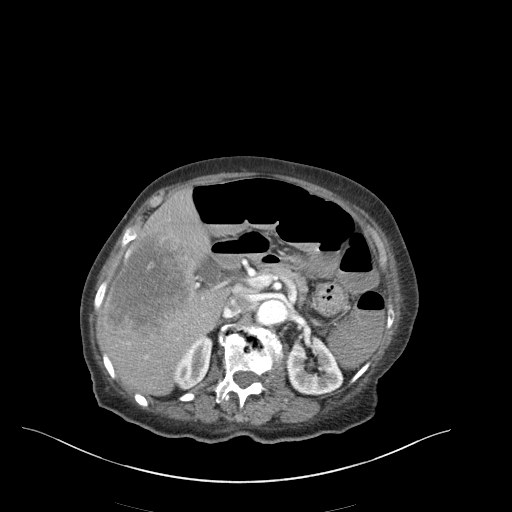
[im 68/93  bone]
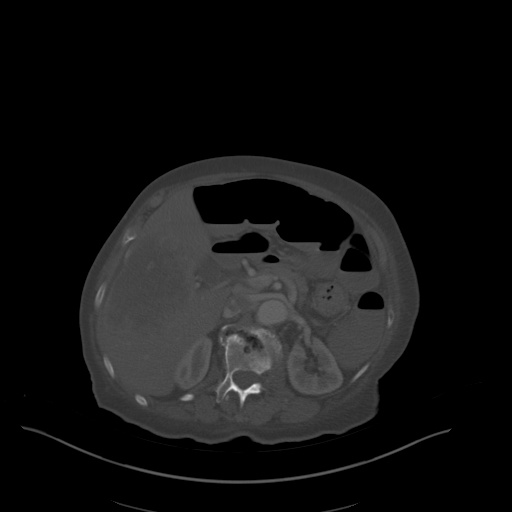
[im 80/93  soft-tissue]
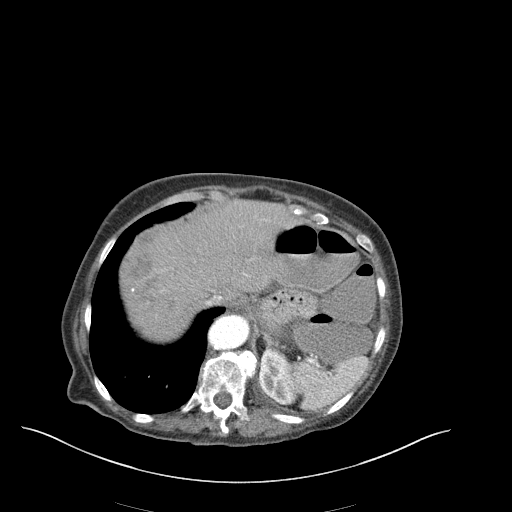
[im 86/93  soft-tissue]
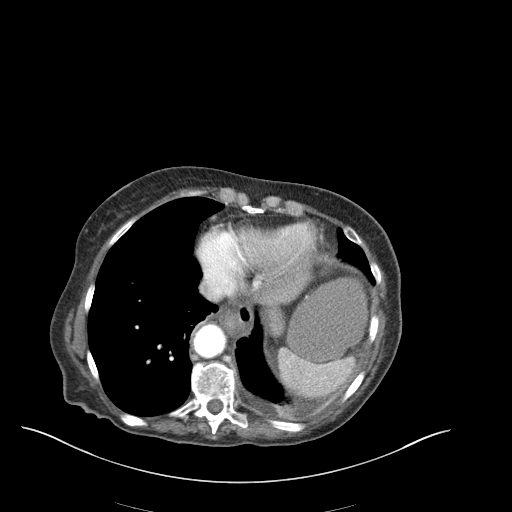

[Series 4: coronal st · coronal · 0.67mm/px · 3 of 141 slices shown]
[im 47/141  soft-tissue]
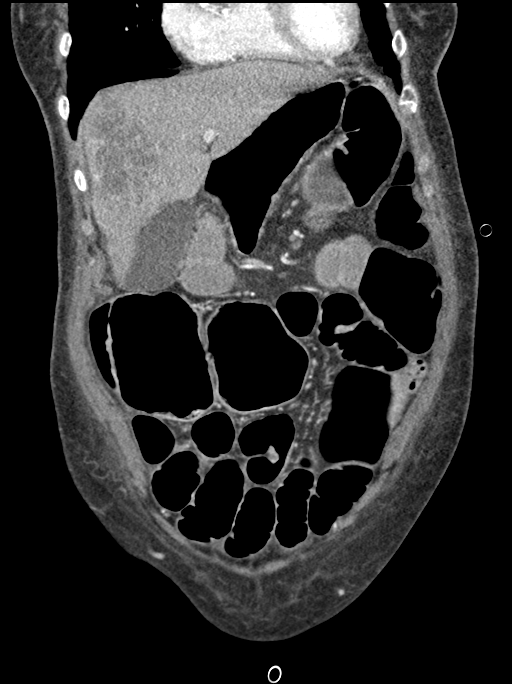
[im 63/141  soft-tissue]
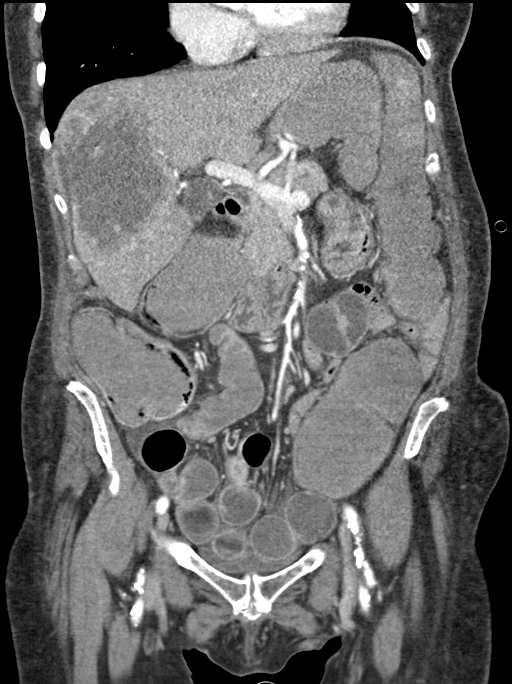
[im 78/141  soft-tissue]
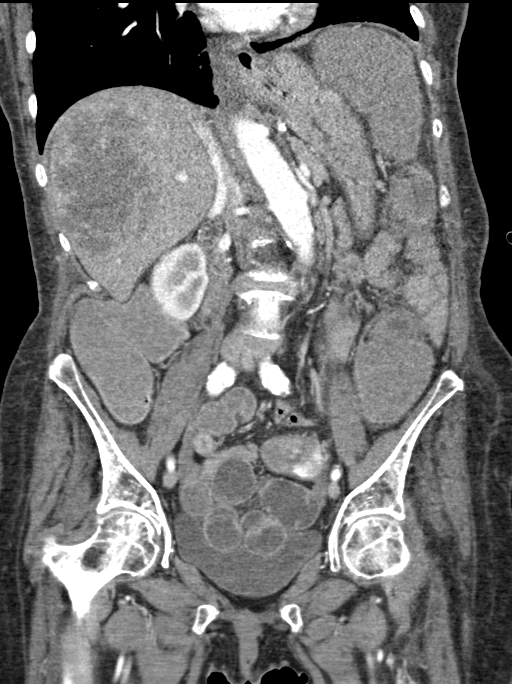

[14 of 46 positions shown; findings below may reference images not displayed]

FINDINGS: Lower chest: Small dependent left pleural effusion with mild
dependent left lower lobe atelectasis. The right lung base is clear.
Atherosclerosis of the aorta and coronary arteries. There is a small
hiatal hernia with mild distal esophageal wall thickening.

Hepatobiliary: There is a large peripherally enhancing mass
involving the right hepatic lobe which measures 10.0 x 7.2 x
cm. There are additional smaller lesions in the right lobe measuring
14 mm on image [DATE] and 14 mm on image 32/2. No definite lesions in
the left lobe. There are no underlying morphologic changes of
cirrhosis. No evidence of gallstones, gallbladder wall thickening or
biliary dilatation.

Pancreas: Unremarkable. No pancreatic ductal dilatation or
surrounding inflammatory changes.

Spleen: Normal in size without focal abnormality.

Adrenals/Urinary Tract: Both adrenal glands appear normal. The
kidneys appear normal without evidence of urinary tract calculus,
suspicious lesion or hydronephrosis. No bladder abnormalities are
seen.

Stomach/Bowel: The stomach and proximal small bowel are
decompressed. There are several mildly dilated and fluid-filled
loops of mid small-bowel with long segment small bowel wall
thickening and hyperenhancement in the right lower quadrant. The
terminal ileum is decompressed. There is at least moderate diffuse
distention of the colon with associated air-fluid levels. There is
fairly abrupt change in caliber in the proximal sigmoid colon with
suspicion of an infiltrating sigmoid colon mass (axial images 60
through 62 of series 2), suspicious for colon cancer. The
rectosigmoid colon is decompressed.

Vascular/Lymphatic: There are no enlarged abdominal or pelvic lymph
nodes. Diffuse aortic and branch vessel atherosclerosis. No evidence
of large vessel occlusion. The portal, superior mesenteric and
splenic veins are patent.

Reproductive: Hysterectomy. No adnexal mass.

Other: Small amount of pelvic ascites. No definite peritoneal
nodularity. No evidence of abdominal wall hernia.

Musculoskeletal: No acute or significant osseous findings. There are
degenerative changes throughout the lumbar spine associated with a
convex left scoliosis. There is sclerosis of both femoral heads, not
typical for AVN. No evidence of osseous metastatic disease.
IMPRESSION: 1. Suspected infiltrating sigmoid colon mass, highly suspicious for
colon cancer, with resulting distal colonic obstruction.
2. Large peripherally enhancing mass involving the right hepatic
lobe with additional smaller lesions in the right lobe, most
consistent with metastatic colon cancer. There are no morphologic
changes of cirrhosis to suggest hepatocellular carcinoma.
3. Long segment wall thickening and hyperenhancement in the distal
small bowel, without focal mass lesion, possibly inflammatory. There
is resulting mild proximal upstream small-bowel dilatation.
4. Small dependent left pleural effusion with mild dependent left
lower lobe atelectasis. Small amount of ascites without definite
peritoneal nodularity.
5. Aortic Atherosclerosis ([id number]).
